# Patient Record
Sex: Male | Born: 1940 | Race: Black or African American | Hispanic: No | Smoking: Never smoker
Health system: Southern US, Community
[De-identification: ages and names within clinical notes are randomized; demographics above are authoritative.]

## PROBLEM LIST (undated history)

## (undated) DIAGNOSIS — Z86010 Personal history of colonic polyps: Secondary | ICD-10-CM

## (undated) DIAGNOSIS — K219 Gastro-esophageal reflux disease without esophagitis: Secondary | ICD-10-CM

## (undated) DIAGNOSIS — J45909 Unspecified asthma, uncomplicated: Secondary | ICD-10-CM

## (undated) DIAGNOSIS — M25511 Pain in right shoulder: Secondary | ICD-10-CM

## (undated) DIAGNOSIS — R030 Elevated blood-pressure reading, without diagnosis of hypertension: Secondary | ICD-10-CM

## (undated) DIAGNOSIS — R350 Frequency of micturition: Secondary | ICD-10-CM

## (undated) DIAGNOSIS — K635 Polyp of colon: Secondary | ICD-10-CM

## (undated) DIAGNOSIS — B029 Zoster without complications: Secondary | ICD-10-CM

## (undated) DIAGNOSIS — K222 Esophageal obstruction: Secondary | ICD-10-CM

## (undated) HISTORY — PX: HERNIA REPAIR: SHX51

## (undated) HISTORY — DX: Unspecified asthma, uncomplicated: J45.909

## (undated) HISTORY — PX: HEMORRHOID SURGERY: SHX153

## (undated) HISTORY — DX: Personal history of colonic polyps: Z86.010

## (undated) HISTORY — DX: Gastro-esophageal reflux disease without esophagitis: K21.9

## (undated) HISTORY — PX: TONSILLECTOMY: SUR1361

## (undated) HISTORY — DX: Pain in right shoulder: M25.511

## (undated) HISTORY — DX: Zoster without complications: B02.9

## (undated) HISTORY — DX: Frequency of micturition: R35.0

## (undated) HISTORY — DX: Elevated blood-pressure reading, without diagnosis of hypertension: R03.0

## (undated) HISTORY — DX: Polyp of colon: K63.5

## (undated) HISTORY — DX: Esophageal obstruction: K22.2

---

## 1989-12-23 DIAGNOSIS — Z860100 Personal history of colon polyps, unspecified: Secondary | ICD-10-CM

## 1989-12-23 HISTORY — DX: Personal history of colonic polyps: Z86.010

## 1989-12-23 HISTORY — DX: Personal history of colon polyps, unspecified: Z86.0100

## 2000-07-27 ENCOUNTER — Inpatient Hospital Stay (HOSPITAL_COMMUNITY): Admission: EM | Admit: 2000-07-27 | Discharge: 2000-08-04 | Payer: Self-pay | Admitting: Emergency Medicine

## 2000-07-27 ENCOUNTER — Encounter: Payer: Self-pay | Admitting: Emergency Medicine

## 2001-02-05 ENCOUNTER — Inpatient Hospital Stay (HOSPITAL_COMMUNITY): Admission: RE | Admit: 2001-02-05 | Discharge: 2001-02-07 | Payer: Self-pay | Admitting: Internal Medicine

## 2003-07-29 ENCOUNTER — Inpatient Hospital Stay (HOSPITAL_COMMUNITY): Admission: EM | Admit: 2003-07-29 | Discharge: 2003-08-02 | Payer: Self-pay | Admitting: Internal Medicine

## 2004-11-01 ENCOUNTER — Ambulatory Visit: Payer: Self-pay | Admitting: Internal Medicine

## 2004-12-07 ENCOUNTER — Ambulatory Visit: Payer: Self-pay | Admitting: Internal Medicine

## 2005-01-10 ENCOUNTER — Ambulatory Visit: Payer: Self-pay | Admitting: Internal Medicine

## 2005-03-01 ENCOUNTER — Ambulatory Visit: Payer: Self-pay | Admitting: Internal Medicine

## 2005-04-16 ENCOUNTER — Ambulatory Visit: Payer: Self-pay | Admitting: Internal Medicine

## 2005-06-04 ENCOUNTER — Ambulatory Visit: Payer: Self-pay | Admitting: Internal Medicine

## 2005-07-31 ENCOUNTER — Ambulatory Visit: Payer: Self-pay | Admitting: Internal Medicine

## 2005-08-14 ENCOUNTER — Ambulatory Visit: Payer: Self-pay | Admitting: Internal Medicine

## 2005-08-29 ENCOUNTER — Ambulatory Visit: Payer: Self-pay | Admitting: Internal Medicine

## 2005-09-16 ENCOUNTER — Ambulatory Visit: Payer: Self-pay | Admitting: Internal Medicine

## 2005-10-23 ENCOUNTER — Ambulatory Visit: Payer: Self-pay | Admitting: Internal Medicine

## 2005-11-06 ENCOUNTER — Ambulatory Visit: Payer: Self-pay | Admitting: Internal Medicine

## 2005-11-25 ENCOUNTER — Ambulatory Visit: Payer: Self-pay | Admitting: Internal Medicine

## 2005-11-27 ENCOUNTER — Ambulatory Visit: Payer: Self-pay | Admitting: Internal Medicine

## 2005-12-09 ENCOUNTER — Ambulatory Visit: Payer: Self-pay | Admitting: Internal Medicine

## 2005-12-12 ENCOUNTER — Ambulatory Visit: Payer: Self-pay | Admitting: Internal Medicine

## 2005-12-17 ENCOUNTER — Ambulatory Visit: Payer: Self-pay | Admitting: Internal Medicine

## 2005-12-24 ENCOUNTER — Ambulatory Visit: Payer: Self-pay | Admitting: Internal Medicine

## 2005-12-27 ENCOUNTER — Ambulatory Visit: Payer: Self-pay | Admitting: Internal Medicine

## 2006-01-03 ENCOUNTER — Ambulatory Visit: Payer: Self-pay | Admitting: Internal Medicine

## 2006-01-07 ENCOUNTER — Ambulatory Visit: Payer: Self-pay | Admitting: Internal Medicine

## 2006-01-08 ENCOUNTER — Ambulatory Visit: Payer: Self-pay | Admitting: Internal Medicine

## 2006-01-10 ENCOUNTER — Ambulatory Visit: Payer: Self-pay | Admitting: Internal Medicine

## 2006-01-14 ENCOUNTER — Ambulatory Visit: Payer: Self-pay | Admitting: Internal Medicine

## 2006-01-16 ENCOUNTER — Ambulatory Visit: Payer: Self-pay | Admitting: Internal Medicine

## 2006-01-17 ENCOUNTER — Ambulatory Visit: Payer: Self-pay | Admitting: Internal Medicine

## 2006-01-21 ENCOUNTER — Ambulatory Visit: Payer: Self-pay | Admitting: Internal Medicine

## 2006-01-23 ENCOUNTER — Ambulatory Visit: Payer: Self-pay | Admitting: Internal Medicine

## 2006-01-28 ENCOUNTER — Ambulatory Visit: Payer: Self-pay | Admitting: Internal Medicine

## 2006-01-31 ENCOUNTER — Ambulatory Visit: Payer: Self-pay | Admitting: Internal Medicine

## 2006-02-05 ENCOUNTER — Ambulatory Visit: Payer: Self-pay | Admitting: Internal Medicine

## 2006-02-07 ENCOUNTER — Ambulatory Visit: Payer: Self-pay | Admitting: Internal Medicine

## 2006-02-10 ENCOUNTER — Ambulatory Visit: Payer: Self-pay | Admitting: Internal Medicine

## 2006-02-12 ENCOUNTER — Ambulatory Visit: Payer: Self-pay | Admitting: Internal Medicine

## 2006-02-17 ENCOUNTER — Ambulatory Visit: Payer: Self-pay | Admitting: Internal Medicine

## 2006-02-19 ENCOUNTER — Ambulatory Visit: Payer: Self-pay | Admitting: Internal Medicine

## 2006-02-21 ENCOUNTER — Ambulatory Visit: Payer: Self-pay | Admitting: Internal Medicine

## 2006-02-25 ENCOUNTER — Ambulatory Visit: Payer: Self-pay | Admitting: Internal Medicine

## 2006-02-28 ENCOUNTER — Ambulatory Visit: Payer: Self-pay | Admitting: Internal Medicine

## 2006-03-03 ENCOUNTER — Ambulatory Visit: Payer: Self-pay | Admitting: Internal Medicine

## 2006-03-04 ENCOUNTER — Ambulatory Visit: Payer: Self-pay | Admitting: Internal Medicine

## 2006-03-07 ENCOUNTER — Ambulatory Visit: Payer: Self-pay | Admitting: Internal Medicine

## 2006-03-12 ENCOUNTER — Ambulatory Visit: Payer: Self-pay | Admitting: Internal Medicine

## 2006-03-13 ENCOUNTER — Ambulatory Visit: Payer: Self-pay | Admitting: Internal Medicine

## 2006-03-14 ENCOUNTER — Ambulatory Visit: Payer: Self-pay | Admitting: Internal Medicine

## 2006-03-19 ENCOUNTER — Ambulatory Visit: Payer: Self-pay | Admitting: Internal Medicine

## 2006-03-21 ENCOUNTER — Ambulatory Visit: Payer: Self-pay | Admitting: Internal Medicine

## 2006-03-31 ENCOUNTER — Ambulatory Visit: Payer: Self-pay | Admitting: Internal Medicine

## 2006-04-11 ENCOUNTER — Ambulatory Visit: Payer: Self-pay | Admitting: Internal Medicine

## 2006-04-18 ENCOUNTER — Ambulatory Visit: Payer: Self-pay | Admitting: Internal Medicine

## 2006-04-25 ENCOUNTER — Ambulatory Visit: Payer: Self-pay | Admitting: Internal Medicine

## 2006-05-09 ENCOUNTER — Ambulatory Visit: Payer: Self-pay | Admitting: Internal Medicine

## 2006-05-14 ENCOUNTER — Ambulatory Visit: Payer: Self-pay | Admitting: Internal Medicine

## 2006-05-23 ENCOUNTER — Ambulatory Visit: Payer: Self-pay | Admitting: Internal Medicine

## 2006-06-11 ENCOUNTER — Ambulatory Visit: Payer: Self-pay | Admitting: Internal Medicine

## 2006-06-17 ENCOUNTER — Ambulatory Visit: Payer: Self-pay | Admitting: Internal Medicine

## 2006-07-11 ENCOUNTER — Ambulatory Visit: Payer: Self-pay | Admitting: Internal Medicine

## 2006-07-16 ENCOUNTER — Ambulatory Visit: Payer: Self-pay | Admitting: Internal Medicine

## 2006-07-29 ENCOUNTER — Ambulatory Visit: Payer: Self-pay | Admitting: Internal Medicine

## 2006-08-08 ENCOUNTER — Ambulatory Visit: Payer: Self-pay | Admitting: Internal Medicine

## 2006-08-13 ENCOUNTER — Ambulatory Visit: Payer: Self-pay | Admitting: Internal Medicine

## 2006-08-15 ENCOUNTER — Ambulatory Visit: Payer: Self-pay | Admitting: Internal Medicine

## 2006-08-19 ENCOUNTER — Ambulatory Visit: Payer: Self-pay | Admitting: Internal Medicine

## 2006-08-29 ENCOUNTER — Ambulatory Visit: Payer: Self-pay | Admitting: Internal Medicine

## 2006-09-01 ENCOUNTER — Ambulatory Visit: Payer: Self-pay | Admitting: Internal Medicine

## 2006-09-05 ENCOUNTER — Ambulatory Visit: Payer: Self-pay | Admitting: Internal Medicine

## 2006-09-12 ENCOUNTER — Ambulatory Visit: Payer: Self-pay | Admitting: Internal Medicine

## 2006-09-19 ENCOUNTER — Ambulatory Visit: Payer: Self-pay | Admitting: Internal Medicine

## 2006-10-03 ENCOUNTER — Ambulatory Visit: Payer: Self-pay | Admitting: Internal Medicine

## 2006-10-17 ENCOUNTER — Ambulatory Visit: Payer: Self-pay | Admitting: Internal Medicine

## 2006-10-31 ENCOUNTER — Ambulatory Visit: Payer: Self-pay | Admitting: Internal Medicine

## 2006-11-03 ENCOUNTER — Ambulatory Visit: Payer: Self-pay | Admitting: Emergency Medicine

## 2006-11-06 ENCOUNTER — Ambulatory Visit: Payer: Self-pay | Admitting: Internal Medicine

## 2006-11-28 ENCOUNTER — Ambulatory Visit: Payer: Self-pay | Admitting: Internal Medicine

## 2006-12-03 ENCOUNTER — Ambulatory Visit: Payer: Self-pay | Admitting: Internal Medicine

## 2006-12-25 ENCOUNTER — Ambulatory Visit: Payer: Self-pay | Admitting: Internal Medicine

## 2006-12-29 ENCOUNTER — Ambulatory Visit: Payer: Self-pay | Admitting: Internal Medicine

## 2007-01-15 ENCOUNTER — Ambulatory Visit: Payer: Self-pay | Admitting: Internal Medicine

## 2007-01-27 ENCOUNTER — Ambulatory Visit: Payer: Self-pay | Admitting: Internal Medicine

## 2007-01-28 ENCOUNTER — Emergency Department (HOSPITAL_COMMUNITY): Admission: EM | Admit: 2007-01-28 | Discharge: 2007-01-28 | Payer: Self-pay | Admitting: Emergency Medicine

## 2007-01-29 ENCOUNTER — Ambulatory Visit: Payer: Self-pay | Admitting: Internal Medicine

## 2007-02-13 ENCOUNTER — Ambulatory Visit: Payer: Self-pay | Admitting: Internal Medicine

## 2007-02-20 ENCOUNTER — Ambulatory Visit: Payer: Self-pay | Admitting: Internal Medicine

## 2007-02-20 ENCOUNTER — Ambulatory Visit: Payer: Self-pay | Admitting: Pulmonary Disease

## 2007-02-27 ENCOUNTER — Ambulatory Visit: Payer: Self-pay | Admitting: Internal Medicine

## 2007-03-05 ENCOUNTER — Ambulatory Visit: Payer: Self-pay | Admitting: Internal Medicine

## 2007-03-06 ENCOUNTER — Ambulatory Visit: Payer: Self-pay | Admitting: Internal Medicine

## 2007-03-09 ENCOUNTER — Ambulatory Visit: Payer: Self-pay | Admitting: Internal Medicine

## 2007-03-12 ENCOUNTER — Ambulatory Visit: Payer: Self-pay | Admitting: Internal Medicine

## 2007-03-20 ENCOUNTER — Ambulatory Visit: Payer: Self-pay | Admitting: Internal Medicine

## 2007-03-24 ENCOUNTER — Ambulatory Visit: Payer: Self-pay | Admitting: Internal Medicine

## 2007-04-03 ENCOUNTER — Ambulatory Visit: Payer: Self-pay | Admitting: Internal Medicine

## 2007-04-10 ENCOUNTER — Ambulatory Visit: Payer: Self-pay | Admitting: Internal Medicine

## 2007-04-23 ENCOUNTER — Ambulatory Visit: Payer: Self-pay | Admitting: Internal Medicine

## 2007-05-06 ENCOUNTER — Ambulatory Visit: Payer: Self-pay | Admitting: Internal Medicine

## 2007-06-08 ENCOUNTER — Ambulatory Visit: Payer: Self-pay | Admitting: Internal Medicine

## 2007-07-07 ENCOUNTER — Ambulatory Visit: Payer: Self-pay | Admitting: Internal Medicine

## 2007-07-16 ENCOUNTER — Ambulatory Visit: Payer: Self-pay | Admitting: Internal Medicine

## 2007-07-24 ENCOUNTER — Ambulatory Visit: Payer: Self-pay | Admitting: Internal Medicine

## 2007-07-31 ENCOUNTER — Ambulatory Visit: Payer: Self-pay | Admitting: Internal Medicine

## 2007-08-07 ENCOUNTER — Ambulatory Visit: Payer: Self-pay | Admitting: Internal Medicine

## 2007-08-18 ENCOUNTER — Ambulatory Visit: Payer: Self-pay | Admitting: Internal Medicine

## 2007-08-28 ENCOUNTER — Ambulatory Visit: Payer: Self-pay | Admitting: Internal Medicine

## 2007-09-09 ENCOUNTER — Ambulatory Visit: Payer: Self-pay | Admitting: Internal Medicine

## 2007-09-18 ENCOUNTER — Ambulatory Visit: Payer: Self-pay | Admitting: Internal Medicine

## 2007-10-02 ENCOUNTER — Ambulatory Visit: Payer: Self-pay | Admitting: Internal Medicine

## 2007-10-07 ENCOUNTER — Ambulatory Visit: Payer: Self-pay | Admitting: Internal Medicine

## 2007-10-23 ENCOUNTER — Ambulatory Visit: Payer: Self-pay | Admitting: Internal Medicine

## 2007-11-05 ENCOUNTER — Ambulatory Visit: Payer: Self-pay | Admitting: Internal Medicine

## 2007-12-08 ENCOUNTER — Ambulatory Visit: Payer: Self-pay | Admitting: Internal Medicine

## 2007-12-14 ENCOUNTER — Encounter: Payer: Self-pay | Admitting: Internal Medicine

## 2007-12-18 ENCOUNTER — Ambulatory Visit: Payer: Self-pay | Admitting: Internal Medicine

## 2008-01-04 DIAGNOSIS — J4550 Severe persistent asthma, uncomplicated: Secondary | ICD-10-CM | POA: Insufficient documentation

## 2008-01-04 DIAGNOSIS — R350 Frequency of micturition: Secondary | ICD-10-CM

## 2008-01-04 DIAGNOSIS — I10 Essential (primary) hypertension: Secondary | ICD-10-CM | POA: Insufficient documentation

## 2008-01-05 ENCOUNTER — Ambulatory Visit: Payer: Self-pay | Admitting: Internal Medicine

## 2008-01-05 DIAGNOSIS — M25519 Pain in unspecified shoulder: Secondary | ICD-10-CM | POA: Insufficient documentation

## 2008-01-06 ENCOUNTER — Ambulatory Visit: Payer: Self-pay | Admitting: Pulmonary Disease

## 2008-01-07 ENCOUNTER — Telehealth (INDEPENDENT_AMBULATORY_CARE_PROVIDER_SITE_OTHER): Payer: Self-pay | Admitting: *Deleted

## 2008-01-08 ENCOUNTER — Emergency Department (HOSPITAL_COMMUNITY): Admission: EM | Admit: 2008-01-08 | Discharge: 2008-01-08 | Payer: Self-pay | Admitting: Emergency Medicine

## 2008-01-08 ENCOUNTER — Encounter: Payer: Self-pay | Admitting: Adult Health

## 2008-01-22 ENCOUNTER — Ambulatory Visit: Payer: Self-pay | Admitting: Internal Medicine

## 2008-01-25 ENCOUNTER — Ambulatory Visit: Payer: Self-pay | Admitting: Internal Medicine

## 2008-02-03 ENCOUNTER — Ambulatory Visit: Payer: Self-pay | Admitting: Internal Medicine

## 2008-02-17 ENCOUNTER — Ambulatory Visit: Payer: Self-pay | Admitting: Internal Medicine

## 2008-03-08 ENCOUNTER — Ambulatory Visit: Payer: Self-pay | Admitting: Internal Medicine

## 2008-03-14 ENCOUNTER — Encounter: Payer: Self-pay | Admitting: Internal Medicine

## 2008-03-23 ENCOUNTER — Encounter (INDEPENDENT_AMBULATORY_CARE_PROVIDER_SITE_OTHER): Payer: Self-pay | Admitting: *Deleted

## 2008-03-23 ENCOUNTER — Ambulatory Visit: Payer: Self-pay | Admitting: Internal Medicine

## 2008-04-08 ENCOUNTER — Ambulatory Visit: Payer: Self-pay | Admitting: Internal Medicine

## 2008-04-22 ENCOUNTER — Ambulatory Visit: Payer: Self-pay | Admitting: Internal Medicine

## 2008-04-29 ENCOUNTER — Ambulatory Visit: Payer: Self-pay | Admitting: Internal Medicine

## 2008-05-06 ENCOUNTER — Ambulatory Visit: Payer: Self-pay | Admitting: Internal Medicine

## 2008-05-13 ENCOUNTER — Ambulatory Visit: Payer: Self-pay | Admitting: Internal Medicine

## 2008-05-27 ENCOUNTER — Ambulatory Visit: Payer: Self-pay | Admitting: Internal Medicine

## 2008-06-09 ENCOUNTER — Ambulatory Visit: Payer: Self-pay | Admitting: Internal Medicine

## 2008-06-16 ENCOUNTER — Ambulatory Visit: Payer: Self-pay | Admitting: Internal Medicine

## 2008-06-27 ENCOUNTER — Ambulatory Visit: Payer: Self-pay | Admitting: Internal Medicine

## 2008-07-11 ENCOUNTER — Ambulatory Visit: Payer: Self-pay | Admitting: Internal Medicine

## 2008-08-05 ENCOUNTER — Ambulatory Visit: Payer: Self-pay | Admitting: Internal Medicine

## 2008-08-11 ENCOUNTER — Ambulatory Visit: Payer: Self-pay | Admitting: Internal Medicine

## 2008-08-26 ENCOUNTER — Ambulatory Visit: Payer: Self-pay | Admitting: Internal Medicine

## 2008-09-13 ENCOUNTER — Ambulatory Visit: Payer: Self-pay | Admitting: Internal Medicine

## 2008-09-15 ENCOUNTER — Encounter: Payer: Self-pay | Admitting: Internal Medicine

## 2008-10-05 ENCOUNTER — Ambulatory Visit: Payer: Self-pay | Admitting: Internal Medicine

## 2008-10-17 ENCOUNTER — Ambulatory Visit: Payer: Self-pay | Admitting: Internal Medicine

## 2008-11-11 ENCOUNTER — Ambulatory Visit: Payer: Self-pay | Admitting: Internal Medicine

## 2008-11-16 ENCOUNTER — Telehealth (INDEPENDENT_AMBULATORY_CARE_PROVIDER_SITE_OTHER): Payer: Self-pay | Admitting: *Deleted

## 2008-11-28 ENCOUNTER — Ambulatory Visit: Payer: Self-pay | Admitting: Internal Medicine

## 2008-12-21 ENCOUNTER — Ambulatory Visit: Payer: Self-pay | Admitting: Internal Medicine

## 2009-01-11 ENCOUNTER — Ambulatory Visit: Payer: Self-pay | Admitting: Internal Medicine

## 2009-01-16 ENCOUNTER — Telehealth: Payer: Self-pay | Admitting: Internal Medicine

## 2009-01-16 ENCOUNTER — Ambulatory Visit: Payer: Self-pay | Admitting: Critical Care Medicine

## 2009-02-08 ENCOUNTER — Ambulatory Visit: Payer: Self-pay | Admitting: Internal Medicine

## 2009-03-07 ENCOUNTER — Ambulatory Visit: Payer: Self-pay | Admitting: Internal Medicine

## 2009-04-03 ENCOUNTER — Ambulatory Visit: Payer: Self-pay | Admitting: Internal Medicine

## 2009-04-04 ENCOUNTER — Telehealth (INDEPENDENT_AMBULATORY_CARE_PROVIDER_SITE_OTHER): Payer: Self-pay | Admitting: *Deleted

## 2009-04-05 ENCOUNTER — Ambulatory Visit: Payer: Self-pay | Admitting: Internal Medicine

## 2009-04-19 ENCOUNTER — Ambulatory Visit: Payer: Self-pay | Admitting: Internal Medicine

## 2009-05-24 ENCOUNTER — Ambulatory Visit: Payer: Self-pay | Admitting: Internal Medicine

## 2009-05-31 ENCOUNTER — Ambulatory Visit: Payer: Self-pay | Admitting: Internal Medicine

## 2009-05-31 DIAGNOSIS — J302 Other seasonal allergic rhinitis: Secondary | ICD-10-CM

## 2009-05-31 DIAGNOSIS — J3089 Other allergic rhinitis: Secondary | ICD-10-CM

## 2009-06-05 ENCOUNTER — Emergency Department (HOSPITAL_COMMUNITY): Admission: EM | Admit: 2009-06-05 | Discharge: 2009-06-06 | Payer: Self-pay | Admitting: Emergency Medicine

## 2009-06-06 ENCOUNTER — Telehealth (INDEPENDENT_AMBULATORY_CARE_PROVIDER_SITE_OTHER): Payer: Self-pay | Admitting: *Deleted

## 2009-06-15 ENCOUNTER — Ambulatory Visit: Payer: Self-pay | Admitting: Internal Medicine

## 2009-06-30 ENCOUNTER — Ambulatory Visit: Payer: Self-pay | Admitting: Internal Medicine

## 2009-08-02 ENCOUNTER — Ambulatory Visit: Payer: Self-pay | Admitting: Internal Medicine

## 2009-08-04 ENCOUNTER — Telehealth: Payer: Self-pay | Admitting: Internal Medicine

## 2009-08-31 ENCOUNTER — Ambulatory Visit: Payer: Self-pay | Admitting: Internal Medicine

## 2009-09-21 ENCOUNTER — Ambulatory Visit: Payer: Self-pay | Admitting: Internal Medicine

## 2009-09-21 ENCOUNTER — Telehealth: Payer: Self-pay | Admitting: Internal Medicine

## 2009-10-02 ENCOUNTER — Ambulatory Visit: Payer: Self-pay | Admitting: Internal Medicine

## 2009-10-19 ENCOUNTER — Ambulatory Visit: Payer: Self-pay | Admitting: Internal Medicine

## 2009-10-25 ENCOUNTER — Ambulatory Visit: Payer: Self-pay | Admitting: Internal Medicine

## 2009-10-30 ENCOUNTER — Ambulatory Visit: Payer: Self-pay | Admitting: Internal Medicine

## 2009-11-06 ENCOUNTER — Ambulatory Visit: Payer: Self-pay | Admitting: Internal Medicine

## 2009-11-06 DIAGNOSIS — K219 Gastro-esophageal reflux disease without esophagitis: Secondary | ICD-10-CM | POA: Insufficient documentation

## 2009-11-15 ENCOUNTER — Ambulatory Visit: Payer: Self-pay | Admitting: Internal Medicine

## 2009-11-20 ENCOUNTER — Ambulatory Visit: Payer: Self-pay | Admitting: Internal Medicine

## 2009-11-29 ENCOUNTER — Ambulatory Visit: Payer: Self-pay | Admitting: Internal Medicine

## 2009-12-05 ENCOUNTER — Ambulatory Visit: Payer: Self-pay | Admitting: Internal Medicine

## 2009-12-08 ENCOUNTER — Ambulatory Visit: Payer: Self-pay | Admitting: Internal Medicine

## 2009-12-20 ENCOUNTER — Ambulatory Visit: Payer: Self-pay | Admitting: Internal Medicine

## 2009-12-23 DIAGNOSIS — K635 Polyp of colon: Secondary | ICD-10-CM

## 2009-12-23 HISTORY — DX: Polyp of colon: K63.5

## 2009-12-27 ENCOUNTER — Ambulatory Visit: Payer: Self-pay | Admitting: Internal Medicine

## 2010-01-04 ENCOUNTER — Ambulatory Visit: Payer: Self-pay | Admitting: Internal Medicine

## 2010-01-08 ENCOUNTER — Ambulatory Visit: Payer: Self-pay | Admitting: Internal Medicine

## 2010-01-11 ENCOUNTER — Ambulatory Visit: Payer: Self-pay | Admitting: Internal Medicine

## 2010-01-17 ENCOUNTER — Ambulatory Visit: Payer: Self-pay | Admitting: Internal Medicine

## 2010-01-26 ENCOUNTER — Ambulatory Visit: Payer: Self-pay | Admitting: Internal Medicine

## 2010-01-26 DIAGNOSIS — R079 Chest pain, unspecified: Secondary | ICD-10-CM

## 2010-01-30 ENCOUNTER — Ambulatory Visit: Payer: Self-pay | Admitting: Internal Medicine

## 2010-02-06 ENCOUNTER — Ambulatory Visit: Payer: Self-pay | Admitting: Internal Medicine

## 2010-02-06 LAB — CONVERTED CEMR LAB
Bilirubin Urine: NEGATIVE
Ketones, ur: NEGATIVE mg/dL
Leukocytes, UA: NEGATIVE
Specific Gravity, Urine: <=1.005 (ref 1.000–1.030)
Urobilinogen, UA: 0.2 (ref 0.0–1.0)

## 2010-02-20 ENCOUNTER — Ambulatory Visit: Payer: Self-pay | Admitting: Internal Medicine

## 2010-02-28 ENCOUNTER — Ambulatory Visit: Payer: Self-pay | Admitting: Internal Medicine

## 2010-03-05 ENCOUNTER — Encounter: Payer: Self-pay | Admitting: Adult Health

## 2010-03-07 ENCOUNTER — Ambulatory Visit: Payer: Self-pay | Admitting: Internal Medicine

## 2010-03-20 ENCOUNTER — Ambulatory Visit: Payer: Self-pay | Admitting: Internal Medicine

## 2010-03-30 ENCOUNTER — Ambulatory Visit: Payer: Self-pay | Admitting: Internal Medicine

## 2010-04-03 ENCOUNTER — Ambulatory Visit: Payer: Self-pay | Admitting: Internal Medicine

## 2010-04-10 ENCOUNTER — Ambulatory Visit: Payer: Self-pay | Admitting: Internal Medicine

## 2010-04-18 ENCOUNTER — Ambulatory Visit: Payer: Self-pay | Admitting: Internal Medicine

## 2010-05-01 ENCOUNTER — Telehealth (INDEPENDENT_AMBULATORY_CARE_PROVIDER_SITE_OTHER): Payer: Self-pay | Admitting: *Deleted

## 2010-05-03 ENCOUNTER — Ambulatory Visit: Payer: Self-pay | Admitting: Internal Medicine

## 2010-05-14 ENCOUNTER — Telehealth: Payer: Self-pay | Admitting: Internal Medicine

## 2010-05-16 ENCOUNTER — Ambulatory Visit: Payer: Self-pay | Admitting: Internal Medicine

## 2010-05-16 ENCOUNTER — Telehealth (INDEPENDENT_AMBULATORY_CARE_PROVIDER_SITE_OTHER): Payer: Self-pay | Admitting: *Deleted

## 2010-05-25 ENCOUNTER — Ambulatory Visit: Payer: Self-pay | Admitting: Internal Medicine

## 2010-05-29 ENCOUNTER — Ambulatory Visit: Payer: Self-pay | Admitting: Internal Medicine

## 2010-06-07 ENCOUNTER — Ambulatory Visit: Payer: Self-pay | Admitting: Internal Medicine

## 2010-06-13 ENCOUNTER — Ambulatory Visit: Payer: Self-pay | Admitting: Internal Medicine

## 2010-06-18 ENCOUNTER — Ambulatory Visit: Payer: Self-pay | Admitting: Internal Medicine

## 2010-07-06 ENCOUNTER — Ambulatory Visit: Payer: Self-pay | Admitting: Internal Medicine

## 2010-07-12 ENCOUNTER — Telehealth: Payer: Self-pay | Admitting: Internal Medicine

## 2010-07-12 ENCOUNTER — Ambulatory Visit: Payer: Self-pay | Admitting: Internal Medicine

## 2010-07-24 ENCOUNTER — Telehealth: Payer: Self-pay | Admitting: Internal Medicine

## 2010-07-27 ENCOUNTER — Ambulatory Visit: Payer: Self-pay | Admitting: Internal Medicine

## 2010-07-31 ENCOUNTER — Ambulatory Visit: Payer: Self-pay | Admitting: Internal Medicine

## 2010-08-16 ENCOUNTER — Ambulatory Visit: Payer: Self-pay | Admitting: Internal Medicine

## 2010-08-30 ENCOUNTER — Ambulatory Visit: Payer: Self-pay | Admitting: Internal Medicine

## 2010-09-03 ENCOUNTER — Ambulatory Visit: Payer: Self-pay | Admitting: Internal Medicine

## 2010-09-03 ENCOUNTER — Encounter: Payer: Self-pay | Admitting: Internal Medicine

## 2010-09-04 ENCOUNTER — Ambulatory Visit: Payer: Self-pay | Admitting: Internal Medicine

## 2010-09-05 LAB — CONVERTED CEMR LAB: CK-MB: 3.3 ng/mL (ref 0.3–4.0)

## 2010-09-10 ENCOUNTER — Ambulatory Visit: Payer: Self-pay | Admitting: Internal Medicine

## 2010-09-12 ENCOUNTER — Telehealth (INDEPENDENT_AMBULATORY_CARE_PROVIDER_SITE_OTHER): Payer: Self-pay | Admitting: *Deleted

## 2010-09-13 ENCOUNTER — Ambulatory Visit: Payer: Self-pay | Admitting: Internal Medicine

## 2010-09-21 ENCOUNTER — Ambulatory Visit: Payer: Self-pay | Admitting: Internal Medicine

## 2010-09-28 ENCOUNTER — Ambulatory Visit: Payer: Self-pay | Admitting: Internal Medicine

## 2010-10-05 ENCOUNTER — Ambulatory Visit: Payer: Self-pay | Admitting: Internal Medicine

## 2010-10-17 ENCOUNTER — Ambulatory Visit: Payer: Self-pay | Admitting: Internal Medicine

## 2010-10-22 ENCOUNTER — Ambulatory Visit: Payer: Self-pay | Admitting: Internal Medicine

## 2010-11-08 ENCOUNTER — Ambulatory Visit: Payer: Self-pay | Admitting: Internal Medicine

## 2010-11-12 ENCOUNTER — Encounter: Payer: Self-pay | Admitting: Internal Medicine

## 2010-11-12 ENCOUNTER — Ambulatory Visit: Payer: Self-pay | Admitting: Internal Medicine

## 2010-11-19 ENCOUNTER — Ambulatory Visit: Payer: Self-pay | Admitting: Internal Medicine

## 2010-11-20 ENCOUNTER — Ambulatory Visit: Payer: Self-pay | Admitting: Internal Medicine

## 2010-11-23 ENCOUNTER — Telehealth: Payer: Self-pay | Admitting: Internal Medicine

## 2010-11-29 ENCOUNTER — Ambulatory Visit: Payer: Self-pay | Admitting: Internal Medicine

## 2010-12-11 ENCOUNTER — Ambulatory Visit: Payer: Self-pay | Admitting: Internal Medicine

## 2010-12-25 ENCOUNTER — Ambulatory Visit: Payer: Self-pay | Admitting: Internal Medicine

## 2010-12-26 ENCOUNTER — Encounter (INDEPENDENT_AMBULATORY_CARE_PROVIDER_SITE_OTHER): Payer: Self-pay | Admitting: *Deleted

## 2011-01-09 ENCOUNTER — Ambulatory Visit
Admission: RE | Admit: 2011-01-09 | Discharge: 2011-01-09 | Payer: Self-pay | Source: Home / Self Care | Attending: Internal Medicine | Admitting: Internal Medicine

## 2011-01-10 ENCOUNTER — Ambulatory Visit: Payer: Self-pay | Admitting: Internal Medicine

## 2011-01-20 LAB — CONVERTED CEMR LAB
AST: 22 units/L (ref 0–37)
Albumin: 3.8 g/dL (ref 3.5–5.2)
BUN: 13 mg/dL (ref 6–23)
Basophils Absolute: 0 10*3/uL (ref 0.0–0.1)
Chloride: 111 meq/L (ref 96–112)
Cholesterol: 198 mg/dL (ref 0–200)
Eosinophils Relative: 0.8 % (ref 0.0–5.0)
Glucose, Bld: 102 mg/dL — ABNORMAL HIGH (ref 70–99)
HCT: 41.4 % (ref 39.0–52.0)
Lymphs Abs: 2.3 10*3/uL (ref 0.7–4.0)
MCV: 88.1 fL (ref 78.0–100.0)
Monocytes Absolute: 0.3 10*3/uL (ref 0.1–1.0)
Neutro Abs: 9 10*3/uL — ABNORMAL HIGH (ref 1.4–7.7)
Platelets: 305 10*3/uL (ref 150.0–400.0)
Potassium: 3.8 meq/L (ref 3.5–5.1)
RDW: 13 % (ref 11.5–14.6)
TSH: 0.74 microintl units/mL (ref 0.35–5.50)
Total Protein: 6.7 g/dL (ref 6.0–8.3)

## 2011-01-22 ENCOUNTER — Ambulatory Visit: Payer: Self-pay | Admitting: Internal Medicine

## 2011-01-24 NOTE — Progress Notes (Signed)
Summary: samples  Phone Note Call from Patient Call back at [facility] Phone [phone]   Caller: Patient Call For: wert Reason for Call: Talk to Nurse Summary of Call: need samples of Dulera Initial call taken by: Eugene Gavia,  May 01, 2010 3:37 PM  Follow-up for Phone Call        `Spoke with pt , and left him a sample of dulera 200 to pick up has an appt with Dr. Sherene Sires on Magdalene Molly 05/03/10

## 2011-01-24 NOTE — Assessment & Plan Note (Signed)
Summary: rov/missed appt/apc   Copy to:  Dr. Romeo Apple Primary Provider/Referring Provider:  none   History of Present Illness: October 19, 2009--Returns for follow up and no complaints. Symbicort does not add up, says he had 2 samples, only 9 doses used out of current symbicort inhaler. pt ed given , med calnedar adjusted. No flare since last visit. Denies chest pain, dyspnea, orthopnea, hemoptysis, fever, n/v/d, edema, headache.   October 25, 2009- Allergic asthma Off antihistamines, returning at his request for allergy re-testing. Feels well. He always wheezes some and admits cooler weather makes this a little worse. Denies fever, purulent, chest pain or palpitation.  Skin Test- Strongly Pos grass, dust mite and endodermals.   January 11, 2010- Allergic asthma Doing ok building allergy vaccine here. No reactions or problems. Asthma is "as good as it gets" now. Somedays he is clear with no wheeze, then weather  changes and he may wheeze a little bit. It doesn't keep him awake or keep him from doing what he wants to do. Sometimes productive cough.   May 16, 2010- Allergic asthma, Rhinitis Returning for allergy f/u. Sees Dr Sherene Sires regularly for primary pulmonary. He has built allergy vaccine here to maintenance at 1:50 with no reactions. He says rhinitis and asthma have done well through the Spring pollen season. Needs refill Nasonex. Last used Xopenex rescue inhaler 2-3 days ago. Denies nasal congestion, sneeze, blowing or headache.    Current Medications (verified): 1)  Nasonex 50 Mcg/act  Susp (Mometasone Furoate) .Marland Kitchen.. 1 Puff in Each Notril Two Times A Day 2)  Dulera 200-5 Mcg/act Aero (Mometasone Furo-Formoterol Fum) .... 2 Puffs First Thing  in Am and 2 Puffs Again in Pm About 12 Hours Later 3)  Adult Aspirin Ec Low Strength 81 Mg  Tbec (Aspirin) .... Take 1 Tablet By Mouth Once A Day 4)  Zegerid 40-1100 Mg Caps (Omeprazole-Sodium Bicarbonate) .... Take  One 30-60 Min Before First  Meal of The Day and At Bedtime 5)  Calcium Carbonate-Vitamin D 600-400 Mg-Unit  Tabs (Calcium Carbonate-Vitamin D) .... Take 1 Tablet By Mouth Two Times A Day 6)  Allergy Vaccine New Start Here 7)  Xopenex Hfa 45 Mcg/act Aero (Levalbuterol Tartrate) .Marland Kitchen.. 1-2 Puffs Every 4-6 Hours As Needed 8)  Albuterol Sulfate (2.5 Mg/34ml) 0.083%  Nebu (Albuterol Sulfate) .Marland Kitchen.. 1 Treatmen Every 4-6 Hours As Needed 9)  Xopenex 1.25 Mg/29ml Nebu (Levalbuterol Hcl) .... One Treatment Every 4-6 Hours As Needed 10)  Flutter Valve .... Use Every 4 Hours As Needed 11)  Mucus Relief 400 Mg  Tabs (Guaifenesin) .... 2-3 Every 12 Hours As Needed 12)  Aleve 220 Mg Tabs (Naproxen Sodium) .... As Needed Arthritis Pain Per Bottle  Allergies (verified): No Known Drug Allergies  Past History:  Past Medical History: Last updated: 05/03/2010 ASTHMA (ICD-493.90)   --chronic severe asthma  with best FEV1 2.2 L documented 2/02 and       documented non-adherence  Mainly lives off samples of inhalers and nose sprays    - HFA  75% June 15, 2009 > 90% August 02, 2009 > 100% December 27, 2009     - Off allergy vaccines x7/2010   > highly allergic skin tests per Dr Maple Hudson > restart vaccine November 06, 2009  SHOULDER PAIN, RIGHT (ICD-719.41) FREQUENCY, URINARY (ICD-788.41) HYPERTENSION, BORDERLINE (ICD-401.9) Shingles T 8 Left 9/01 HEALTH MAINTENANCE.............................................................................Marland KitchenWert    - Colonoscopy 12/1989    - dT  4/06    - Pneumovax 3/04 and 05/2009     -  Flu declined  October 17, 2008 and September 21, 2009      -CPX  May 31, 2009     - Meds reviewed with pt education and computerized med calendar adjusted  October 19, 2009, February 20, 2010      Past Surgical History: Last updated: 01/11/2010 Tonsillectomy Umbilical hernia hemorrhoids  Family History: Last updated: 05/31/2009 positive for asthma in sister positive for heart disease in mother brown lung in father lung  cancer brother smoker  Social History: Last updated: 05/31/2009 Denies ever smoking No ETOH Retired  Risk Factors: Smoking Status: never (01/04/2008)  Review of Systems      See HPI       The patient complains of non-productive cough.  The patient denies shortness of breath with activity, shortness of breath at rest, productive cough, coughing up blood, chest pain, irregular heartbeats, acid heartburn, indigestion, loss of appetite, weight change, abdominal pain, difficulty swallowing, sore throat, tooth/dental problems, headaches, nasal congestion/difficulty breathing through nose, and sneezing.    Vital Signs:  Patient profile:   70 year old male Height:      68 inches Weight:      202 pounds BMI:     30.83 O2 Sat:      93 % on Room air Pulse rate:   79 / minute BP sitting:   122 / 80  (left arm) Cuff size:   regular  Vitals Entered By: Reynaldo Minium CMA (May 16, 2010 10:42 AM)  O2 Flow:  Room air  Physical Exam  Additional Exam:  General: A/Ox3; pleasant and cooperative, calm/ reserved  205 November 06, 2009  >  206 February 06, 2010 >>202 February 20, 2010 > 205 March 20, 2010 > 209 May 03, 2010  SKIN: no rash, lesions NODES: no lymphadenopathy HEENT: [place]/AT, EOM- WNL, Conjuctivae- clear, PERRLA, TM-WNL, Nose- narrow on right/ septal deviation, Throat- clear and wnl, no erythema or drainage  Mallampati  III NECK: Supple w/ fair ROM, JVD- none, normal carotid impulses w/o bruits Thyroid-, not hoarse CHEST: Dry cough, wheeze right midback, unlabored, no rales. Slow expiratory phase. HEART: RRR, no m/g/r heard no edema ABDOMEN: Soft and nl;  EAV:WUJW, nl pulses, no clubbing, no calf tenderness   Impression & Recommendations:  Problem # 1:  CHRONIC RHINITIS (ICD-472.0)  Not active today. There is a septal deviation, not bothering him. Nasonex discussed and refilled.  Orders: Est. Patient Level IV (11914) Prescription Created Electronically [phone])  Problem # 2:   ASTHMA (ICD-493.90) Atopic component is being addressed. We will advance vaccine to 1:10 per protocol at next order.  Medications Added to Medication List This Visit: 1)  Allergy Vaccine 1:50 Gh  .... Will advance to 1:10 next order  Patient Instructions: 1)  Please schedule a follow-up appointment in 6 months. 2)  We will advance the strngth of your allergy shots to 1:10 at next ordering. Script given to lab. 3)  Script for Nasonex sent Prescriptions: NASONEX 50 MCG/ACT  SUSP (MOMETASONE FUROATE) 1 puff in each notril two times a day  #1 x prn   Entered and Authorized by:   Waymon Budge MD   Signed by:   Waymon Budge MD on 05/16/2010   Method used:   Electronically to        [facility]. [phone]* (retail)       [address]       [place],  [place]  [address]       Ph: [phone]       Fax: [phone]   RxID:   [id number]

## 2011-01-24 NOTE — Miscellaneous (Signed)
Summary: meds update  Clinical Lists Changes  Medications: Changed medication from NASONEX 50 MCG/ACT  SUSP (MOMETASONE FUROATE) Two puffs each nostril daily to NASONEX 50 MCG/ACT  SUSP (MOMETASONE FUROATE) 1 puff in each notril two times a day Added new medication of VESICARE 10 MG  TABS (SOLIFENACIN SUCCINATE) Take 1 tablet by mouth once a day Changed medication from OMEPRAZOLE 40 MG  CPDR (OMEPRAZOLE) Take 1 tablet by mouth once a day to OMEPRAZOLE 20 MG  CPDR (OMEPRAZOLE) Take 1 capsule by mouth once a day before meal Changed medication from MUCINEX 600 MG  TB12 (GUAIFENESIN) 1-2 two times a day as needed cough/congestion/thick mucus to MUCUS RELIEF 400 MG  TABS (GUAIFENESIN) 2-3 every 12 hours as needed

## 2011-01-24 NOTE — Assessment & Plan Note (Signed)
Summary: Primary/pulmonary ov, better on dulera   Copy to:  Dr. Romeo Apple Primary Provider/Referring Provider:  none  CC:  6 wk followup.  Pt states breathing has been okay.  Gets more SOB with "damp weather".  No new complaints today.Marland Kitchen  History of Present Illness: A 67 yobm never smoker with chronic severe asthma  with best FEV1 2.2 L  2/02 and documented non-adherence  Mainly lives off samples of inhalers and nose sprays- "cant afford"  September 21, 2009 ov worse sob/chest tightness off meds again, tells me now also off shots for months now. --refer to allergy    October 25, 2009- Allergic asthma Off antihistamines, returning at his request for allergy re-testing. Feels well. He always wheezes some and admits cooler weather makes this a little worse. Denies fever, purulent, chest pain or palpitation.  Skin Test- Strongly Pos grass, dust mite and endodermals.   January 11, 2010- Allergic asthma Doing ok building allergy vaccine here. No reactions or problems. Asthma is "as good as it gets" now. Somedays he is clear with no wheeze, then weather  changes and he may wheeze a little bit. It doesn't keep him awake or keep him from doing what he wants to do. Sometimes productive cough.   January 26, 2010 Acute visit.  Pt c/o wheezing x 2 days.  He also c/o soreness across chest and back worse with cough, has had similar problems  in past and also has assoc mild dysphagia.   Also has prod cough with cleatr sputum.   February 06, 2010 Acute visit.  Pt c/o prod cough with yellow sputum x 1 day.  He also c/o wheezing since 2/12 with increased need for neb and new left flank pain and polyuria but no dysuria.    February 20, 2010 --Returns for follow up and med review. Last visit w/ exacerbation , changed from symbicort to dulera. Tx w/ abx/steroid taper. He is feeling better and back to baseline.   March 20, 2010 f/u 1 wk.-not better,sob -same,cough-white, yellow ,wheezing = baseline for him, not  waking up or using rescue at all.  Dulera count was 76  May 03, 2010 6 wk followup.  Pt states breathing has been okay.  Gets more SOB with "damp weather".  No new complaints today. brand new dulera yesterday count 118. Pt denies any significant sore throat, dysphagia, itching, sneezing,  nasal congestion, cp.  Current Medications (verified): 1)  Nasonex 50 Mcg/act  Susp (Mometasone Furoate) .Marland Kitchen.. 1 Puff in Each Notril Two Times A Day 2)  Dulera 200-5 Mcg/act Aero (Mometasone Furo-Formoterol Fum) .... 2 Puffs First Thing  in Am and 2 Puffs Again in Pm About 12 Hours Later 3)  Adult Aspirin Ec Low Strength 81 Mg  Tbec (Aspirin) .... Take 1 Tablet By Mouth Once A Day 4)  Zegerid 40-1100 Mg Caps (Omeprazole-Sodium Bicarbonate) .... Take  One 30-60 Min Before First Meal of The Day and At Bedtime 5)  Calcium Carbonate-Vitamin D 600-400 Mg-Unit  Tabs (Calcium Carbonate-Vitamin D) .... Take 1 Tablet By Mouth Two Times A Day 6)  Allergy Vaccine New Start Here 7)  Xopenex Hfa 45 Mcg/act Aero (Levalbuterol Tartrate) .Marland Kitchen.. 1-2 Puffs Every 4-6 Hours As Needed 8)  Albuterol Sulfate (2.5 Mg/59ml) 0.083%  Nebu (Albuterol Sulfate) .Marland Kitchen.. 1 Treatmen Every 4-6 Hours As Needed 9)  Xopenex 1.25 Mg/55ml Nebu (Levalbuterol Hcl) .... One Treatment Every 4-6 Hours As Needed 10)  Flutter Valve .... Use Every 4 Hours As Needed  11)  Mucus Relief 400 Mg  Tabs (Guaifenesin) .... 2-3 Every 12 Hours As Needed 12)  Aleve 220 Mg Tabs (Naproxen Sodium) .... As Needed Arthritis Pain Per Bottle  Allergies (verified): No Known Drug Allergies  Past History:  Past Medical History: ASTHMA (ICD-493.90)   --chronic severe asthma  with best FEV1 2.2 L documented 2/02 and       documented non-adherence  Mainly lives off samples of inhalers and nose sprays    - HFA  75% June 15, 2009 > 90% August 02, 2009 > 100% December 27, 2009     - Off allergy vaccines x7/2010   > highly allergic skin tests per Dr Maple Hudson > restart vaccine November 06, 2009  SHOULDER PAIN, RIGHT (ICD-719.41) FREQUENCY, URINARY (ICD-788.41) HYPERTENSION, BORDERLINE (ICD-401.9) Shingles T 8 Left 9/01 HEALTH MAINTENANCE.............................................................................Marland KitchenWert    - Colonoscopy 12/1989    - dT  4/06    - Pneumovax 3/04 and 05/2009     -Flu declined  October 17, 2008 and September 21, 2009      -CPX  May 31, 2009     - Meds reviewed with pt education and computerized med calendar adjusted  October 19, 2009, February 20, 2010      Vital Signs:  Patient profile:   70 year old male Weight:      209 pounds O2 Sat:      94 % on Room air Temp:     97.7 degrees F oral Pulse rate:   77 / minute BP sitting:   112 / 64  (left arm)  Vitals Entered By: Renold Genta RCP, LPN (May 03, 2010 9:55 AM)  O2 Flow:  Room air  Physical Exam  Additional Exam:  General: A/Ox3; pleasant and cooperative, a bit more anxious than baseline, still somewhat childlike affect  205 November 06, 2009  >  206 February 06, 2010 >>202 February 20, 2010 > 205 March 20, 2010 > 209 May 03, 2010  SKIN: no rash, lesions NODES: no lymphadenopathy HEENT: [place]/AT, EOM- WNL, Conjuctivae- clear, PERRLA, TM-WNL, Nose- clear, Throat- clear and wnl NECK: Supple w/ fair ROM, JVD- none, normal carotid impulses w/o bruits Thyroid- CHEST: CTA no wheezing except @ fvc HEART: RRR, no m/g/r heard no edema ABDOMEN: Soft and nl; neg cvat ZOX:WRUE, nl pulses, no clubbing, no calf tenderness   Impression & Recommendations:  Problem # 1:  ASTHMA (ICD-493.90)  DDX of  difficult airways managment all start with A and  include Adherence, Ace Inhibitors, Acid Reflux, Active Sinus Disease, Alpha 1 Antitripsin deficiency, Anxiety masquerading as Airways dz,  ABPA,  allergy(esp in young), Aspiration (esp in elderly), Adverse effects of DPI,  Active smokers, plus one B  = Beta blocker use..   Adherence seems better on dulera, counts correlate  Active sinus dz:  continue  nasonex (having trouble affording)  Allergies:  Per Dr Maple Hudson  Acid reflux:  reviewed diet/ ppi importance   Each maintenance medication was reviewed in detail including most importantly the difference between maintenance and as needed and under what circumstances the prns are to be used. This was done in the context of a medication calendar review which provided the patient with a user-friendly unambiguous mechanism for medication administration and reconciliation and provides an action plan for all active problems. It is critical that this be shown to every doctor  for modification during the office visit if necessary so the patient can use it as a working document.  Other Orders: Est. Patient Level III (16109)  Patient Instructions: 1)  See calendar for specific medication instructions and bring it back for each and every office visit for every healthcare provider you see.  Without it,  you may not receive the best quality medical care that we feel you deserve.  2)  Please schedule a follow-up appointment in 6 weeks, sooner if needed  3)  Dulera count is 118

## 2011-01-24 NOTE — Miscellaneous (Signed)
Summary: Injection Record/[facility]  Injection Record/[facility]   Imported By: Sherian Rein 05/15/2010 13:09:50  _____________________________________________________________________  External Attachment:    Type:   Image     Comment:   External Document

## 2011-01-24 NOTE — Assessment & Plan Note (Signed)
Summary: Primary svc/ f/u ov   Copy to:  Dr. Romeo Apple Primary Provider/Referring Provider:  none  CC:  Cough- improved.  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   January 11, 2010- Allergic asthma Doing ok building allergy vaccine here. No reactions or problems. Asthma is "as good as it gets" now. Somedays he is clear with no wheeze, then weather  changes and he may wheeze a little bit. It doesn't keep him awake or keep him from doing what he wants to do. Sometimes productive cough.   May 16, 2010- Allergic asthma, Rhinitis Returning for allergy f/u. Sees Dr Sherene Sires regularly for primary pulmonary. He has built allergy vaccine here to maintenance at 1:50 with no reactions. He says rhinitis and asthma have done well through the Spring pollen season. Needs refill Nasonex. Last used Xopenex rescue inhaler 2-3 days ago. Denies nasal congestion, sneeze, blowing or headache.  November 12, 2010- - Allergic asthma, Rhinitis Nurse-CC: follow up visit-allergies. Due to advance to 1:10 w/ next vaccineorder. Says he's doing ok. Declines flu vax. No seasonal flare of asthma- no ER trips or prednisone. Ne recent need for either nebulizer or rescue inhaler  November 19, 2010 ov cc cough and congestion a little worse x sev days.  December 11, 2010 ov cough better, breathing ok DULERA Count  121  > count is 39  should be 34, no sob over baseline or need for rescue.   rec no change  January 09, 2011 ov cough and sob better. dulera count 64 denies rescue need or noct complaints. Pt denies any significant sore throat, dysphagia, itching, sneezing,  nasal congestion or excess secretions,  fever, chills, sweats, unintended wt loss, pleuritic or exertional cp, hempoptysis, change in activity tolerance  orthopnea pnd or leg swelling.       Current Medications (verified): 1)  Nasonex 50 Mcg/act  Susp (Mometasone Furoate) .Marland Kitchen.. 1 Puff in Each Notril Two  Times A Day 2)  Dulera 200-5 Mcg/act Aero (Mometasone Furo-Formoterol Fum) .... 2 Puffs First Thing  in Am and 2 Puffs Again in Pm About 12 Hours Later 3)  Adult Aspirin Ec Low Strength 81 Mg  Tbec (Aspirin) .... Take 1 Tablet By Mouth Once A Day 4)  Pepcid 20 Mg Tabs (Famotidine) .Marland Kitchen.. 1 Tab By Mouth in The Morning and 1 At Bedtime 5)  Citrucel  Powd (Methylcellulose (Laxative)) .Marland Kitchen.. 1 Teaspoon in The Morning and 1 At Bedtime 6)  Allergy Vaccine  1:10 Gh .... Next Order 7)  Xopenex Hfa 45 Mcg/act Aero (Levalbuterol Tartrate) .... 2 Puffs Every 4 Hours As Needed 8)  Albuterol Sulfate (2.5 Mg/40ml) 0.083%  Nebu (Albuterol Sulfate) .Marland Kitchen.. 1 Treatmen Every 4-6 Hours As Needed 9)  Flutter Valve .... Use Every 4 Hours As Needed 10)  Mucus Relief 400 Mg  Tabs (Guaifenesin) .... 2-3 Every 12 Hours As Needed 11)  Aleve 220 Mg Tabs (Naproxen Sodium) .... Per Bottle As Needed For Joint Pain 12)  Gas-X Extra Strength 125 Mg Caps (Simethicone) .... Per Box As Needed Gas or Bloating 13)  Chloraseptic Sore Throat 1000 Mg/10ml Liqd (Acetaminophen) .... As Directed Per Bottle As Needed  Allergies (verified): No Known Drug Allergies  Past History:  Past Medical History: ASTHMA (ICD-493.90)   --chronic severe asthma  with best FEV1 2.2 L documented 2/202 and       documented non-adherence  Mainly lives off samples of inhalers and nose sprays    -  HFA  75% June 15, 2009 > 90% August 02, 2009 > 100% December 27, 2009     - Off allergy vaccines x7/2010   > highly allergic skin tests per Dr Maple Hudson > restart vaccine November 06, 2009  SHOULDER PAIN, RIGHT (ICD-719.41) FREQUENCY, URINARY (ICD-788.41) HYPERTENSION, BORDERLINE (ICD-401.9) Shingles T 8 Left 9/01 HEALTH MAINTENANCE.............................................................................................Marland KitchenWert    - Colonoscopy 12/1989    - dT  4/06    - Pneumovax 3/04 and 05/2009     -Flu declined  October 17, 2008 and September 21, 2009      -CPX    May 31, 2009     - Meds reviewed with pt education and computerized med calendar adjusted  October 19, 2009, February 20, 2010, September 10, 2010       Vital Signs:  Patient profile:   70 year old male Weight:      202.13 pounds O2 Sat:      92 % on Room air Temp:     97.2 degrees F oral Pulse rate:   82 / minute BP sitting:   138 / 80  (left arm)  Vitals Entered By: Vernie Murders (January 09, 2011 8:59 AM)  O2 Flow:  Room air  Physical Exam  Additional Exam:  wt  205 November 06, 2009    > 203 December 11, 2010 >  203 December 11, 2010 > 202 January 09, 2011  SKIN: no rash, lesions NODES: no lymphadenopathy ,Periorbital edema,  TM-WNL, Nose- narrow on right/ septal deviation, Throat- clear and wnl, no erythema or drainage   NECK: Supple w/ fair ROM, JVD- none, normal carotid impulses w/o bruits Thyroid-, not hoarse CHEST: Prolonged expiratory time, no sign wheeze HEART: RRR, no m/g/r heard no edema ABDOMEN: Soft and nl;  EAV:WUJW, nl pulses, no clubbing, no calf tenderness     Impression & Recommendations:  Problem # 1:  ASTHMA (ICD-493.90) All goals of asthma met including optimal function and elimination of symptoms with minimum need for rescue therapy. Contingencies discussed today including the rule of two's.   continue to be concerned with adherence on multiple levels.   Each maintenance medication was reviewed in detail including most importantly the difference between maintenance and as needed and under what circumstances the prns are to be used.  This was done in the context of a medication calendar review which provided the patient with a user-friendly unambiguous mechanism for medication administration and reconciliation and provides an action plan for all active problems. It is critical that this be shown to every doctor  for modification during the office visit if necessary so the patient can use it as a working document. See instructions for specific  recommendations   Other Orders: Est. Patient Level III (11914)  Patient Instructions: 1)  See calendar for specific medication instructions and bring it back for each and every office visit for every healthcare provider you see.  Without it,  you may not receive the best quality medical care that we feel you deserve.  2)  DULERA count is 64 and since this is the only one you have we will give you a second one and see you in 6 weeks

## 2011-01-24 NOTE — Progress Notes (Signed)
Summary: nos appt  Phone Note Call from Patient   Caller: [email or web address]  Call For: wert Summary of Call: Rsc nos from 8/1 to 8/9 @ 9:45a. Initial call taken by: Darletta Moll,  July 24, 2010 10:11 AM

## 2011-01-24 NOTE — Medication Information (Signed)
Summary: Tax adviser   Imported By: Lehman Prom 03/05/2010 14:39:07  _____________________________________________________________________  External Attachment:    Type:   Image     Comment:   External Document

## 2011-01-24 NOTE — Progress Notes (Signed)
Summary: refill  Phone Note Call from Patient Call back at [facility] Phone [phone]   Caller: Patient Call For: wert Reason for Call: Refill Medication Summary of Call: Pt c/o up coughing and spitting up green mucous since Tues, wants abx also request refill on his albuterol sulfate.//[facility] Initial call taken by: Darletta Moll,  November 23, 2010 9:19 AM  Follow-up for Phone Call        Spoke with pt and he c/o productive cough with green phlegm, chest congestion x 3 days. Pt denies sob, wheezing. he has had a fever of 100 as well. Please advsie. Carron Curie CMA  November 23, 2010 10:28 AM    Additional Follow-up for Phone Call Additional follow up Details #1::        rx augmentin 875mg  two times a day (generic) x 7days ok to refill albuterol Additional Follow-up by: Storm Frisk MD,  November 23, 2010 11:13 AM    Additional Follow-up for Phone Call Additional follow up Details #2::    Pt aware. rx's sent. Zackery Barefoot CMA  November 23, 2010 11:23 AM   New/Updated Medications: AUGMENTIN 875-125 MG TABS (AMOXICILLIN-POT CLAVULANATE) Take 1 tablet by mouth two times a day Prescriptions: ALBUTEROL SULFATE (2.5 MG/3ML) 0.083%  NEBU (ALBUTEROL SULFATE) 1 treatmen every 4-6 hours as needed  #120 x 1   Entered by:   Zackery Barefoot CMA   Authorized by:   Nyoka Cowden MD   Signed by:   Zackery Barefoot CMA on 11/23/2010   Method used:   Electronically to        [facility] [address] [phone]* (retail)       [address]       [address]       [place], [place]  [address]       Ph: [phone]       Fax: [phone]   RxID:   [id number] AUGMENTIN 875-125 MG TABS (AMOXICILLIN-POT CLAVULANATE) Take 1 tablet by mouth two times a day  #14 x 0   Entered by:   Zackery Barefoot CMA   Authorized by:   Storm Frisk MD   Signed by:   Zackery Barefoot CMA on 11/23/2010   Method used:   Electronically to        [facility] [address] [phone]*  (retail)       [address]       [address]       [place], [place]  [address]       Ph: [phone]       Fax: [phone]   RxID:   [id number]

## 2011-01-24 NOTE — Assessment & Plan Note (Signed)
Summary: NP follow up - med calendar   Copy to:  Dr. Romeo Apple [name]/Referring [name]:  none  CC:  est med calendar - pt brought all meds with him today.  no new complaints.  History of Present Illness: 69yobm never smoker  born prematurely with asthma since age 58s.   October 25, 2009- Allergic asthma Off antihistamines, returning at his request for allergy re-testing. Feels well. He always wheezes some and admits cooler weather makes this a little worse. Denies fever, purulent, chest pain or palpitation.  Skin Test- Strongly Pos grass, dust mite and endodermals.   January 11, 2010- Allergic asthma Doing ok building allergy vaccine here. No reactions or problems. Asthma is "as good as it gets" now. Somedays he is clear with no wheeze, then weather  changes and he may wheeze a little bit. It doesn't keep him awake or keep him from doing what he wants to do. Sometimes productive cough.   May 16, 2010- Allergic asthma, Rhinitis Returning for allergy f/u. Sees Dr Sherene Sires regularly for primary pulmonary. He has built allergy vaccine here to maintenance at 1:50 with no reactions. He says rhinitis and asthma have done well through the Spring pollen season. Needs refill Nasonex. Last used Xopenex rescue inhaler 2-3 days ago.  June 18, 2010 6 wk followup.  Pt states that overall his breathing has been doing well.  No complaints today. rare need for xopnenex hfa and never needs alb neb. rec no change in rx  July 31, 2010 ov Followup.  Pt c/o cough x 2 days- non prod.  Chest feels sore from cough. dulera count is accurate.  used neb once in last 48 hours better. no purulent sputum, ex or lateralizing cp. mild d dyspagia not consistent with ppi as per calendar.   prednisone x 6 days > better, no change in rcx   page 2  September 03, 2010 4 week followup, cough is better.  C/o new onset x 3 weeks almost daily  burning sensation left arm shoulder area and mid chest heart flluttering -  last about 5 minutes,  tends to be more often noted while sitting still,  no nausea, assoc with abd loating    Happening day in am while sitting.  >>?IBS >>Rec to stop prilosec and caltrate, begin pepcid two times a day , and add citrucel. CXR w/ no acute changes and cardiac panel unremarkable.   September 10, 2010--Returns for follow up and med review. Last visit w/ atypical chest pain w/ abd bloating and gas Improved w/ change in meds to pepcid and citrucel. Today he brought in a new Dulera inhaler . 1 week ago inhaler read 66 doses left. He says his inhaler stop working and he had to open new inhaler. New inhaler reads 112 doses left. We reviwed his  meds and organized his meds into a  med calendar. Denies chest pain, dyspnea, orthopnea, hemoptysis, fever, n/v/d, edema, headache.  Last couple of days has noticed w/ cold weather and rain that he has been wheezing. No discolored mucus.   Medications Prior to Update: 1)  Nasonex 50 Mcg/act  Susp (Mometasone Furoate) .Marland Kitchen.. 1 Puff in Each Notril Two Times A Day 2)  Dulera 200-5 Mcg/act Aero (Mometasone Furo-Formoterol Fum) .... 2 Puffs First Thing  in Am and 2 Puffs Again in Pm About 12 Hours Later 3)  Adult Aspirin Ec Low Strength 81 Mg  Tbec (Aspirin) .... Take 1 Tablet By Mouth Once A Day 4)  Zegerid 40-1100 Mg Caps (Omeprazole-Sodium Bicarbonate) .Marland Kitchen.. 1 Before 1st Meal of Day and At Bedtime 5)  Calcium Carbonate-Vitamin D 600-400 Mg-Unit  Tabs (Calcium Carbonate-Vitamin D) .... Take 1 Tablet By Mouth Two Times A Day 6)  Allergy Vaccine Building-Up 7)  Xopenex Hfa 45 Mcg/act Aero (Levalbuterol Tartrate) .Marland Kitchen.. 1-2 Puffs Every 4-6 Hours As Needed 8)  Albuterol Sulfate (2.5 Mg/9ml) 0.083%  Nebu (Albuterol Sulfate) .Marland Kitchen.. 1 Treatmen Every 4-6 Hours As Needed 9)  Xopenex 1.25 Mg/64ml Nebu (Levalbuterol Hcl) .... One Treatment Every 4-6 Hours As Needed 10)  Flutter Valve .... Use Every 4 Hours As Needed 11)  Mucus Relief 400 Mg  Tabs (Guaifenesin) .... 2-3  Every 12 Hours As Needed 12)  Aleve 220 Mg Tabs (Naproxen Sodium) .... As Needed Arthritis Pain Per Bottle  Allergies (verified): No Known Drug Allergies  Past History:  Family History: Last updated: 05/31/2009 positive for asthma in sister positive for heart disease in mother brown lung in father lung cancer brother smoker  Social History: Last updated: 05/31/2009 Denies ever smoking No ETOH Retired  Risk Factors: Smoking Status: never (09/03/2010)  Past Medical History: ASTHMA (ICD-493.90)   --chronic severe asthma  with best FEV1 2.2 L documented 2/202 and       documented non-adherence  Mainly lives off samples of inhalers and nose sprays    - HFA  75% June 15, 2009 > 90% August 02, 2009 > 100% December 27, 2009     - Off allergy vaccines x7/2010   > highly allergic skin tests per Dr Maple Hudson > restart vaccine November 06, 2009  SHOULDER PAIN, RIGHT (ICD-719.41) FREQUENCY, URINARY (ICD-788.41) HYPERTENSION, BORDERLINE (ICD-401.9) Shingles T 8 Left 9/01 HEALTH MAINTENANCE...................................................................................Marland KitchenWert    - Colonoscopy 12/1989    - dT  4/06    - Pneumovax 3/04 and 05/2009     -Flu declined  October 17, 2008 and September 21, 2009      -CPX   May 31, 2009     - Meds reviewed with pt education and computerized med calendar adjusted  October 19, 2009, February 20, 2010, September 10, 2010       Review of Systems      See HPI  Vital Signs:  Patient profile:   70 year old male Height:      68 inches Weight:      205 pounds BMI:     31.28 O2 Sat:      95 % on Room air Temp:     96.5 degrees F oral Pulse rate:   76 / minute BP sitting:   124 / 86  (left arm) Cuff size:   regular  Vitals Entered By: Boone Master CNA/MA (September 10, 2010 9:11 AM)  O2 Flow:  Room air CC: est med calendar - pt brought all meds with him today.  no new complaints Is Patient Diabetic? No Comments Medications reviewed with  patient Daytime contact number verified with patient. Boone Master CNA/MA  September 10, 2010 9:11 AM    Physical Exam  Additional Exam:   205 November 06, 2009  >   > 205 March 20, 2010 > 209 May 03, 2010 > 206 July 31, 2010 > 205 September 03, 2010 >>205 September 10, 2010  SKIN: no rash, lesions NODES: no lymphadenopathy HEENT: Paul/AT, EOM- WNL, Conjuctivae- clear, PERRLA, TM-WNL, Nose- narrow on right/ septal deviation, Throat- clear and wnl, no erythema or drainage  Mallampati  III NECK: Supple w/ fair  ROM, JVD- none, normal carotid impulses w/o bruits Thyroid-, not hoarse CHEST: Prolonged expiratory phase, miild  exp wheeze HEART: RRR, no m/g/r heard no edema ABDOMEN: Soft and nl;  EAV:WUJW, nl pulses, no clubbing, no calf tenderness Skin warm and dry   Impression & Recommendations:  Problem # 1:  ASTHMA (ICD-493.90)  Mild flare. Pt given xopenex neb in office  steroid taper to have on hold if symptoms worsen.  Meds reviewed with pt education and computerized med calendar adjusted.  Please contact office for sooner follow up if symptoms do not improve or worsen  declined flu shot.   Orders: Est. Patient Level IV (11914)  Patient Instructions: 1)  Follow med calendar  closely and bring to each visit.  2)  Use albuterol /xopenex if wheezing persists.  3)  Prednisone taper to have on hold if wheezing does not improve 4)  Please contact office for sooner follow up if symptoms do not improve or worsen  5)  follow up Dr. Sherene Sires in 6 weeks     Appended Document: Orders Update - neb tx    Clinical Lists Changes  Orders: Added new Service order of Nebulizer Tx (78295) - Signed      Appended Document: med calendar update    Clinical Lists Changes  Medications: Added new medication of PEPCID 20 MG TABS (FAMOTIDINE) 1 tab by mouth in the morning and 1 at bedtime Added new medication of CITRUCEL  POWD (METHYLCELLULOSE (LAXATIVE)) 1 teaspoon in the morning and 1  at bedtime Changed medication from [facility] HFA 45 MCG/ACT AERO (LEVALBUTEROL TARTRATE) 1-2 puffs every 4-6 hours as needed to XOPENEX HFA 45 MCG/ACT AERO (LEVALBUTEROL TARTRATE) 2 puffs every 4 hours as needed Changed medication from ALEVE 220 MG TABS (NAPROXEN SODIUM) as needed arthritis pain per bottle to ALEVE 220 MG TABS (NAPROXEN SODIUM) per bottle as needed for joint pain Added new medication of GAS-X EXTRA STRENGTH 125 MG CAPS (SIMETHICONE) per box as needed gas or bloating Removed medication of CALCIUM CARBONATE-VITAMIN D 600-400 MG-UNIT  TABS (CALCIUM CARBONATE-VITAMIN D) Take 1 tablet by mouth two times a day Removed medication of ZEGERID 40-1100 MG CAPS (OMEPRAZOLE-SODIUM BICARBONATE) 1 before 1st meal of day and at bedtime

## 2011-01-24 NOTE — Miscellaneous (Signed)
Summary: Injection Record / [facility]    Injection Record / [facility]    Imported By: Lennie Odor 09/12/2010 13:50:56  _____________________________________________________________________  External Attachment:    Type:   Image     Comment:   External Document

## 2011-01-24 NOTE — Assessment & Plan Note (Signed)
Summary: 2 months/apc   Copy to:  Dr. Romeo Apple Primary Provider/Referring Provider:  none  CC:  2 mo follow up.  no problems with breathing or allergies.  No complaints. .  History of Present Illness: September 21, 2009 ov worse sob/chest tightness off meds again, tells me now also off shots for months now. --refer to allergy   October 02, 2009- Allergic asthma He decided he wanted to restart shots bcause he has been more congested since being off.  --to return for allergy testing.   October 19, 2009--Returns for follow up and no complaints. Symbicort does not add up, says he had 2 samples, only 9 doses used out of current symbicort inhaler. pt ed given , med calnedar adjusted. No flare since last visit. Denies chest pain, dyspnea, orthopnea, hemoptysis, fever, n/v/d, edema, headache.   October 25, 2009- Allergic asthma Off antihistamines, returning at his request for allergy re-testing. Feels well. He always wheezes some and admits cooler weather makes this a little worse. Denies fever, purulent, chest pain or palpitation.  Skin Test- Strongly Pos grass, dust mite and endodermals.   January 11, 2010- Allergic asthma Doing ok building allergy vaccine here. No reactions or problems. Asthma is "as good as it gets" now. Somedays he is clear with no wheeze, then weather  changes and he may wheeze a little bit. It doesn't keep him awake or keep him from doing what he wants to do. Sometimes productive cough.     Current Medications (verified): 1)  Nasacort Aq 55 Mcg/act Aers (Triamcinolone Acetonide(Nasal)) .Marland Kitchen.. 1 Spray Each Nostril Two Times A Day 2)  Nasonex 50 Mcg/act  Susp (Mometasone Furoate) .Marland Kitchen.. 1 Puff in Each Notril Two Times A Day 3)  Symbicort 160-4.5 Mcg/act  Aero (Budesonide-Formoterol Fumarate) .... 2 Puffs Two Times A Day 4)  Adult Aspirin Ec Low Strength 81 Mg  Tbec (Aspirin) .... Take 1 Tablet By Mouth Once A Day 5)  Vitamin D 1000 Unit Tabs (Cholecalciferol) .... Take 1  Tablet By Mouth Once A Day 6)  Zegerid 40-1100 Mg Caps (Omeprazole-Sodium Bicarbonate) .... Take 1 Tablet By Mouth Once A Day Before Meals 7)  Ventolin Hfa 108 (90 Base) Mcg/act Aers (Albuterol Sulfate) .Marland Kitchen.. 1-2 Puffs Every 4-6 Hours As Needed 8)  Albuterol Sulfate (2.5 Mg/73ml) 0.083%  Nebu (Albuterol Sulfate) .... Use Every 4-6 Hours As Needed 9)  Xopenex 1.25 Mg/52ml Nebu (Levalbuterol Hcl) .... One Treatment Every 4-6 Hours As Needed 10)  Flutter Valve .... Use Every 4 Hours As Needed 11)  Mucus Relief 400 Mg  Tabs (Guaifenesin) .... 2-3 Every 12 Hours As Needed 12)  Aleve 220 Mg Tabs (Naproxen Sodium) .... As Needed Arthritis Pain Per Bottle 13)  Allergy Vaccine New Start Here  Allergies (verified): No Known Drug Allergies  Past History:  Past Medical History: Last updated: 12/27/2009 ASTHMA (ICD-493.90)   --chronic severe asthma  with best FEV1 2.2 L documented 2/02 and       documented non-adherence  Mainly lives off samples of inhalers and nose sprays    - HFA  75% June 15, 2009 > 90% August 02, 2009 > 100% December 27, 2009     - Off allergy vaccines x7/2010   > highly allergic skin tests per Dr Maple Hudson > restart vaccine November 06, 2009  SHOULDER PAIN, RIGHT (ICD-719.41) FREQUENCY, URINARY (ICD-788.41) HYPERTENSION, BORDERLINE (ICD-401.9) Shingles T 8 Left 9/01 HEALTH MAINTENANCE.......................................................................Marland KitchenWert    - Colonoscopy 12/1989    - dT  4/06    -  Pneumovax 3/04 and 05/2009     -Flu declined  October 17, 2008 and September 21, 2009      -CPX  May 31, 2009     - Meds reviewed with pt education and computerized med calendar adjusted  October 19, 2009      Family History: Last updated: 05/31/2009 positive for asthma in sister positive for heart disease in mother brown lung in father lung cancer brother smoker  Social History: Last updated: 05/31/2009 Denies ever smoking No ETOH Retired  Risk Factors: Smoking  Status: never (01/04/2008)  Past Surgical History: Tonsillectomy Umbilical hernia hemorrhoids  Review of Systems      See HPI       The patient complains of dyspnea on exertion.  The patient denies anorexia, fever, weight loss, weight gain, vision loss, decreased hearing, hoarseness, chest pain, syncope, peripheral edema, prolonged cough, headaches, hemoptysis, and severe indigestion/heartburn.         Some wheeze most days  Vital Signs:  Patient profile:   70 year old male Height:      68 inches Weight:      204.38 pounds BMI:     31.19 O2 Sat:      95 % on Room air Pulse rate:   78 / minute BP sitting:   130 / 86  (left arm) Cuff size:   regular  Vitals Entered By: Gweneth Dimitri RN (January 11, 2010 1:57 PM)  O2 Flow:  Room air CC: 2 mo follow up.  no problems with breathing or allergies.  No complaints.  Comments Medications reviewed with patient phone number verified with patient Gweneth Dimitri RN  January 11, 2010 1:57 PM    Physical Exam  Additional Exam:  General: A/Ox3; pleasant and cooperative, NAD, casual affect  wt 204 September 21, 2009 >> 205 November 06, 2009  > 199, Jan 20- 204 SKIN: no rash, lesions NODES: no lymphadenopathy HEENT: [place] Blue/AT, EOM- WNL, Conjuctivae- clear, PERRLA, TM-WNL, Nose- clear, Throat- clear and wnl NECK: Supple w/ fair ROM, JVD- none, normal carotid impulses w/o bruits Thyroid- CHEST: Unforced expiratory wheeze across low back and upper anterior chest. Not coughing. HEART: RRR, no m/g/r heard no edema ABDOMEN: Soft and nl; ZOX:WRUE, nl pulses, no clubbing, no calf tenderness   Impression & Recommendations:  Problem # 1:  ASTHMA (ICD-493.90)  Chronic asthma with fixed obstructive airways disease, some atopic component. Allergy vaccine was restarted at his choice and is building without problems. Note he always has some wheeze.   Orders: Est. Patient Level III (45409)  Problem # 2:  CHRONIC RHINITIS (ICD-472.0) Not  symptomatic now. Discussed and refilled nasonex. He will hold script for now, but likely to need it in the Spring.  Patient Instructions: 1)  Please schedule a follow-up appointment in 4 months. We will consider then whether to move up the strength of your allergy shots. 2)  Script refilled for Nasonex Prescriptions: NASONEX 50 MCG/ACT  SUSP (MOMETASONE FUROATE) 1 puff in each notril two times a day  #1 x prn   Entered and Authorized by:   Waymon Budge MD   Signed by:   Waymon Budge MD on 01/11/2010   Method used:   Print then Give to Patient   RxID:   [id number]

## 2011-01-24 NOTE — Assessment & Plan Note (Signed)
Summary: Primary svc/ f/u ov    Copy to:  Dr. Romeo Apple Primary Provider/Referring Provider:  none  CC:  Cough- some worse.  History of Present Illness: History of Present Illness: 70yobm never smoker  born prematurely with asthma since age 43s.   January 11, 2010- Allergic asthma Doing ok building allergy vaccine here. No reactions or problems. Asthma is "as good as it gets" now. Somedays he is clear with no wheeze, then weather  changes and he may wheeze a little bit. It doesn't keep him awake or keep him from doing what he wants to do. Sometimes productive cough.   May 16, 2010- Allergic asthma, Rhinitis Returning for allergy f/u. Sees Dr Sherene Sires regularly for primary pulmonary. He has built allergy vaccine here to maintenance at 1:50 with no reactions. He says rhinitis and asthma have done well through the Spring pollen season. Needs refill Nasonex. Last used Xopenex rescue inhaler 2-3 days ago. Denies nasal congestion, sneeze, blowing or headache.  November 12, 2010- - Allergic asthma, Rhinitis Nurse-CC: follow up visit-allergies. Due to advance to 1:10 w/ next vaccineorder. Says he's doing ok. Declines flu vax. No seasonal flare of asthma- no ER trips or prednisone. Ne recent need for either nebulizer or rescue inhaler  November 19, 2010 ov cc cough and congestion a little worse x sev days, no purulent sputum or increase need for saba. Pt denies any significant sore throat, dysphagia, itching, sneezing,  nasal congestion or excess secretions,  fever, chills, sweats, unintended wt loss, pleuritic or exertional cp, hempoptysis, change in activity tolerance  orthopnea pnd or leg swelling.     Current Medications (verified): 1)  Nasonex 50 Mcg/act  Susp (Mometasone Furoate) .Marland Kitchen.. 1 Puff in Each Notril Two Times A Day 2)  Dulera 200-5 Mcg/act Aero (Mometasone Furo-Formoterol Fum) .... 2 Puffs First Thing  in Am and 2 Puffs Again in Pm About 12 Hours Later 3)  Adult Aspirin Ec Low  Strength 81 Mg  Tbec (Aspirin) .... Take 1 Tablet By Mouth Once A Day 4)  Pepcid 20 Mg Tabs (Famotidine) .Marland Kitchen.. 1 Tab By Mouth in The Morning and 1 At Bedtime 5)  Citrucel  Powd (Methylcellulose (Laxative)) .Marland Kitchen.. 1 Teaspoon in The Morning and 1 At Bedtime 6)  Allergy Vaccine  1:10 Gh .... Next Order 7)  Xopenex Hfa 45 Mcg/act Aero (Levalbuterol Tartrate) .... 2 Puffs Every 4 Hours As Needed 8)  Albuterol Sulfate (2.5 Mg/3ml) 0.083%  Nebu (Albuterol Sulfate) .Marland Kitchen.. 1 Treatmen Every 4-6 Hours As Needed 9)  Flutter Valve .... Use Every 4 Hours As Needed 10)  Mucus Relief 400 Mg  Tabs (Guaifenesin) .... 2-3 Every 12 Hours As Needed 11)  Aleve 220 Mg Tabs (Naproxen Sodium) .... Per Bottle As Needed For Joint Pain 12)  Gas-X Extra Strength 125 Mg Caps (Simethicone) .... Per Box As Needed Gas or Bloating 13)  Chloraseptic Sore Throat 1000 Mg/41ml Liqd (Acetaminophen) .... As Directed Per Bottle As Needed  Allergies (verified): No Known Drug Allergies  Past History:  Past Medical History: ASTHMA (ICD-493.90)   --chronic severe asthma  with best FEV1 2.2 L documented 2/202 and       documented non-adherence  Mainly lives off samples of inhalers and nose sprays    - HFA  75% June 15, 2009 > 90% August 02, 2009 > 100% December 27, 2009     - Off allergy vaccines x7/2010   > highly allergic skin tests per Dr Maple Hudson > restart vaccine November 06, 2009  SHOULDER PAIN, RIGHT (ICD-719.41) FREQUENCY, URINARY (ICD-788.41) HYPERTENSION, BORDERLINE (ICD-401.9) Shingles T 8 Left 9/01 HEALTH MAINTENANCE........................................................................................Marland KitchenWert    - Colonoscopy 12/1989    - dT  4/06    - Pneumovax 3/04 and 05/2009     -Flu declined  October 17, 2008 and September 21, 2009      -CPX   May 31, 2009     - Meds reviewed with pt education and computerized med calendar adjusted  October 19, 2009, February 20, 2010, September 10, 2010       Vital Signs:  Patient  profile:   70 year old male Weight:      204 pounds O2 Sat:      93 % on Room air Temp:     97.7 degrees F oral Pulse rate:   82 / minute BP sitting:   118 / 76  (left arm)  Vitals Entered By: Vernie Murders (November 19, 2010 10:03 AM)  O2 Flow:  Room air  Physical Exam  Additional Exam:  wt  205 November 06, 2009   > 206 July 31, 2010  > 206 October 22, 2010 > 204 November 19, 2010  SKIN: no rash, lesions NODES: no lymphadenopathy ,Periorbital edema,  TM-WNL, Nose- narrow on right/ septal deviation, Throat- clear and wnl, no erythema or drainage   NECK: Supple w/ fair ROM, JVD- none, normal carotid impulses w/o bruits Thyroid-, not hoarse CHEST: Prolonged expiratory phase, coarse sounds in bases but no wheeze or cough HEART: RRR, no m/g/r heard no edema ABDOMEN: Soft and nl;  BJY:NWGN, nl pulses, no clubbing, no calf tenderness     Impression & Recommendations:  Problem # 1:  ASTHMA (ICD-493.90) All goals of asthma met including optimal function and elimination of symptoms with minimum need for rescue therapy. Contingencies discussed today including the rule of two's.   Each maintenance medication was reviewed in detail including most importantly the difference between maintenance and as needed and under what circumstances the prns are to be used.  See instructions for specific recommendations   Medications Added to Medication List This Visit: 1)  Nasonex 50 Mcg/act Susp (Mometasone furoate) .Marland Kitchen.. 1 puff in each notril two times a day  Other Orders: Est. Patient Level III (56213) Prescription Created Electronically [id number])  Patient Instructions: 1)  See calendar for specific medication instructions and bring it back for each and every office visit for every healthcare provider you see.  Without it,  you may not receive the best quality medical care that we feel you deserve.  2)  Please schedule a follow-up appointment in 3 weeks, sooner if needed  3)  ADD DULERA Count is  121  Prescriptions: NASONEX 50 MCG/ACT  SUSP (MOMETASONE FUROATE) 1 puff in each notril two times a day  #1 x 11   Entered and Authorized by:   Nyoka Cowden MD   Signed by:   Nyoka Cowden MD on 11/19/2010   Method used:   Electronically to        [facility] [phone]* (retail)       [address]       [address]       [place], [place]  [address]       Ph: [phone]       Fax: [phone]   RxID:   [id number]

## 2011-01-24 NOTE — Assessment & Plan Note (Signed)
Summary: 6 months/apc   Copy to:  Dr. Romeo Apple Primary Cliford Sequeira/Referring Trust Crago:  none  CC:  follow up visit-allergies. and Headache.  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   January 11, 2010- Allergic asthma Doing ok building allergy vaccine here. No reactions or problems. Asthma is "as good as it gets" now. Somedays he is clear with no wheeze, then weather  changes and he may wheeze a little bit. It doesn't keep him awake or keep him from doing what he wants to do. Sometimes productive cough.   May 16, 2010- Allergic asthma, Rhinitis Returning for allergy f/u. Sees Dr Sherene Sires regularly for primary pulmonary. He has built allergy vaccine here to maintenance at 1:50 with no reactions. He says rhinitis and asthma have done well through the Spring pollen season. Needs refill Nasonex. Last used Xopenex rescue inhaler 2-3 days ago. Denies nasal congestion, sneeze, blowing or headache.  November 12, 2010- - Allergic asthma, Rhinitis Nurse-CC: follow up visit-allergies. Due to advance to 1:10 w/ next vaccineorder. Says he's doing ok. Declines flu vax. No seasonal flare of asthma- no ER trips or prednisone. Ne recent need for either nebulizer or rescue inhaler      Asthma History    Initial Asthma Severity Rating:    Age range: 12+ years    Symptoms: 0-2 days/week    Nighttime Awakenings: 0-2/month    Interferes w/ normal activity: no limitations    SABA use (not for EIB): 0-2 days/week    Asthma Severity Assessment: Intermittent    Preventive Screening-Counseling & Management  Alcohol-Tobacco     Smoking Status: never  Current Medications (verified): 1)  Nasonex 50 Mcg/act  Susp (Mometasone Furoate) .Marland Kitchen.. 1 Puff in Each Notril Two Times A Day 2)  Dulera 200-5 Mcg/act Aero (Mometasone Furo-Formoterol Fum) .... 2 Puffs First Thing  in Am and 2 Puffs Again in Pm About 12 Hours Later 3)  Adult  Aspirin Ec Low Strength 81 Mg  Tbec (Aspirin) .... Take 1 Tablet By Mouth Once A Day 4)  Pepcid 20 Mg Tabs (Famotidine) .Marland Kitchen.. 1 Tab By Mouth in The Morning and 1 At Bedtime 5)  Citrucel  Powd (Methylcellulose (Laxative)) .Marland Kitchen.. 1 Teaspoon in The Morning and 1 At Bedtime 6)  Xopenex Hfa 45 Mcg/act Aero (Levalbuterol Tartrate) .... 2 Puffs Every 4 Hours As Needed 7)  Albuterol Sulfate (2.5 Mg/40ml) 0.083%  Nebu (Albuterol Sulfate) .Marland Kitchen.. 1 Treatmen Every 4-6 Hours As Needed 8)  Xopenex 1.25 Mg/44ml Nebu (Levalbuterol Hcl) .... One Treatment Every 4-6 Hours As Needed 9)  Flutter Valve .... Use Every 4 Hours As Needed 10)  Mucus Relief 400 Mg  Tabs (Guaifenesin) .... 2-3 Every 12 Hours As Needed 11)  Aleve 220 Mg Tabs (Naproxen Sodium) .... Per Bottle As Needed For Joint Pain 12)  Gas-X Extra Strength 125 Mg Caps (Simethicone) .... Per Box As Needed Gas or Bloating 13)  Allergy Vaccine Building-Up 14)  Chloraseptic Sore Throat 1000 Mg/22ml Liqd (Acetaminophen) .... As Directed Per Bottle As Needed  Allergies (verified): No Known Drug Allergies  Past History:  Past Medical History: Last updated: 10/22/2010 ASTHMA (ICD-493.90)   --chronic severe asthma  with best FEV1 2.2 L documented 2/202 and       documented non-adherence  Mainly lives off samples of inhalers and nose sprays    - HFA  75% June 15, 2009 > 90% August 02, 2009 > 100% December 27, 2009     - Off allergy vaccines x7/2010   > highly allergic skin tests per Dr Maple Hudson > restart vaccine November 06, 2009  SHOULDER PAIN, RIGHT (ICD-719.41) FREQUENCY, URINARY (ICD-788.41) HYPERTENSION, BORDERLINE (ICD-401.9) Shingles T 8 Left 9/01 HEALTH MAINTENANCE......................................................................................Marland KitchenWert    - Colonoscopy 12/1989    - dT  4/06    - Pneumovax 3/04 and 05/2009     -Flu declined  October 17, 2008 and September 21, 2009      -CPX   May 31, 2009     - Meds reviewed with pt education and  computerized med calendar adjusted  October 19, 2009, February 20, 2010, September 10, 2010       Past Surgical History: Last updated: 01/11/2010 Tonsillectomy Umbilical hernia hemorrhoids  Family History: Last updated: 05/31/2009 positive for asthma in sister positive for heart disease in mother brown lung in father lung cancer brother smoker  Social History: Last updated: 05/31/2009 Denies ever smoking No ETOH Retired  Risk Factors: Smoking Status: never (11/12/2010)  Review of Systems      See HPI  The patient denies shortness of breath with activity, shortness of breath at rest, productive cough, non-productive cough, coughing up blood, chest pain, irregular heartbeats, acid heartburn, indigestion, loss of appetite, weight change, abdominal pain, difficulty swallowing, sore throat, tooth/dental problems, headaches, nasal congestion/difficulty breathing through nose, and sneezing.    Vital Signs:  Patient profile:   70 year old male Height:      68 inches Weight:      206.13 pounds BMI:     31.46 O2 Sat:      95 % on Room air Pulse rate:   69 / minute BP sitting:   128 / 78  (left arm) Cuff size:   regular  Vitals Entered By: Reynaldo Minium CMA (November 12, 2010 8:58 AM)  O2 Flow:  Room air CC: follow up visit-allergies., Headache   Physical Exam  Additional Exam:   205 November 06, 2009   > 206 July 31, 2010 > 205 September 10, 2010 >>205 09/13/10 > 206 October 22, 2010  SKIN: no rash, lesions NODES: no lymphadenopathy ,Periorbital edema,  TM-WNL, Nose- narrow on right/ septal deviation, Throat- clear and wnl, no erythema or drainage  Mallampati  III NECK: Supple w/ fair ROM, JVD- none, normal carotid impulses w/o bruits Thyroid-, not hoarse CHEST: Prolonged expiratory phase, coarse sounds in bases but no wheeze or cough HEART: RRR, no m/g/r heard no edema ABDOMEN: Soft and nl;  MWU:XLKG, nl pulses, no clubbing, no calf tenderness     Impression &  Recommendations:  Problem # 1:  CHRONIC RHINITIS (ICD-472.0)  He denies active problems for now. Has Nasonex whern needed.  He has periorbital edema but is not aware of nasal congestion. This may be familial.  Orders: Est. Patient Level IV (40102)  Problem # 2:  ASTHMA (ICD-493.90) Good control. He is tolerating allergy vaccine well and is ready to advance to 1:10 with next order.   Medications Added to Medication List This Visit: 1)  Allergy Vaccine 1:10 Gh  .... Next order  Patient Instructions: 1)  Please schedule a follow-up appointment in 6 months. 2)  We will be advancing the strength of your allergy vaccine to 1:10 with next order.  3)  Continue present meds. Follow-up with Dr Sherene Sires as scheduled for general pulmonary care.  4)  Happy Thanksgiving !

## 2011-01-24 NOTE — Progress Notes (Signed)
Summary: prednisone request-rx for pred taper  Phone Note Call from Patient Call back at Home Phone [phone]   Caller: Patient Call For: tammy parrett Summary of Call: request prednisone taper- [facility] on [address] (pt was seen 09/10/10.  Initial call taken by: Tivis Ringer, CNA,  September 12, 2010 4:22 PM  Follow-up for Phone Call        Pt advised Prednisone taper was not called to pharmacy. Same has not been called to pharmacy, however, I advised pt per 9-19 OV he is only to pick up same if his symptoms are worse. I asked pt if symptoms are worse, pt advised no and that he misunderstood TP. will forward to TP for review. Zackery Barefoot CMA  September 12, 2010 4:34 PM   pt said his mucus has now changed colors needs something.Lacinda Axon  September 13, 2010 11:26 AM  Additional Follow-up for Phone Call Additional follow up Details #1::        Spoke with pt.  He is requesting a pred taper be sent to pharm.  He was seen on 9/19 and told he could have this if needed if wheezing did not improve.  He states that wheezing has improved some, but now has prod cough with thick, yellow sputum and states "think I had high fever, but did'nt check it".  Pls advise thanks Additional Follow-up by: Vernie Murders,  September 13, 2010 11:34 AM    Additional Follow-up for Phone Call Additional follow up Details #2::    prednisone 10mg  4 tabs for 2 days, then 3 tabs for 2 days, 2 tabs for 2 days, then 1 tab for 2 days, then stop #20, no refills  Please contact office for sooner follow up if symptoms do not improve or worsen   Follow-up by: Rubye Oaks NP,  September 13, 2010 1:17 PM  Additional Follow-up for Phone Call Additional follow up Details #3:: Details for Additional Follow-up Action Taken: Rx for pred taper sent.  LMOMTCB. Vernie Murders  September 13, 2010 1:26 PM    pt walked in to the office today and is aware his pred taper is at the pharmacy for him to pick up.  Megan  Reynolds LPN  September 13, 2010 3:11 PM   New/Updated Medications: PREDNISONE 10 MG TABS (PREDNISONE) 4 each am x 2 days, 3 each am x 2 days, 2 each am x 2 days, 1 each am x 2 days, then stop Prescriptions: PREDNISONE 10 MG TABS (PREDNISONE) 4 each am x 2 days, 3 each am x 2 days, 2 each am x 2 days, 1 each am x 2 days, then stop  #20 x 0   Entered by:   Vernie Murders   Authorized by:   Rubye Oaks NP   Signed by:   Vernie Murders on 09/13/2010   Method used:   Electronically to        [facility] [address] [phone]* (retail)       [address]       [address]       [place], [place]  [address]       Ph: [phone]       Fax: [phone]   RxID:   [id number]

## 2011-01-24 NOTE — Assessment & Plan Note (Signed)
Summary: Pulmonary/ acute ext ov, change to dulera 200   Copy to:  Dr. Romeo Apple Primary Provider/Referring Provider:  none  CC:  Acute visit.  Pt c/o wheezing x 2 days.  He also c/o soreness across chest and back.  Also has prod cough with cleatr sputum.Marland Kitchen  History of Present Illness: September 21, 2009 ov worse sob/chest tightness off meds again, tells me now also off shots for months now. --refer to allergy   October 02, 2009- Allergic asthma He decided he wanted to restart shots bcause he has been more congested since being off.  --to return for allergy testing.   October 19, 2009--Returns for follow up and no complaints. Symbicort does not add up, says he had 2 samples, only 9 doses used out of current symbicort inhaler. pt ed given , med calnedar adjusted. No flare since last visit. Denies chest pain, dyspnea, orthopnea, hemoptysis, fever, n/v/d, edema, headache.   October 25, 2009- Allergic asthma Off antihistamines, returning at his request for allergy re-testing. Feels well. He always wheezes some and admits cooler weather makes this a little worse. Denies fever, purulent, chest pain or palpitation.  Skin Test- Strongly Pos grass, dust mite and endodermals.   January 11, 2010- Allergic asthma Doing ok building allergy vaccine here. No reactions or problems. Asthma is "as good as it gets" now. Somedays he is clear with no wheeze, then weather  changes and he may wheeze a little bit. It doesn't keep him awake or keep him from doing what he wants to do. Sometimes productive cough.   January 26, 2010 Acute visit.  Pt c/o wheezing x 2 days.  He also c/o soreness across chest and back worse with cough, has had similar problems  in past and also has assoc mild dysphagia.   Also has prod cough with cleatr sputum.  Pt denies any significant sore throat,  itching, sneezing,  nasal congestion or excess secretions,  fever, chills, sweats, unintended wt loss, pleuritic or exertional cp,  hempoptysis, change in activity tolerance  orthopnea pnd or leg swelling. Pt also denies any obvious fluctuation in symptoms with weather or environmental change or other alleviating or aggravating factors.         Current Medications (verified): 1)  Nasacort Aq 55 Mcg/act Aers (Triamcinolone Acetonide(Nasal)) .Marland Kitchen.. 1 Spray Each Nostril Two Times A Day 2)  Nasonex 50 Mcg/act  Susp (Mometasone Furoate) .Marland Kitchen.. 1 Puff in Each Notril Two Times A Day 3)  Symbicort 160-4.5 Mcg/act  Aero (Budesonide-Formoterol Fumarate) .... 2 Puffs Two Times A Day 4)  Adult Aspirin Ec Low Strength 81 Mg  Tbec (Aspirin) .... Take 1 Tablet By Mouth Once A Day 5)  Vitamin D 1000 Unit Tabs (Cholecalciferol) .... Take 1 Tablet By Mouth Once A Day 6)  Zegerid 40-1100 Mg Caps (Omeprazole-Sodium Bicarbonate) .... Take 1 Tablet By Mouth Once A Day Before Meals 7)  Ventolin Hfa 108 (90 Base) Mcg/act Aers (Albuterol Sulfate) .Marland Kitchen.. 1-2 Puffs Every 4-6 Hours As Needed 8)  Albuterol Sulfate (2.5 Mg/21ml) 0.083%  Nebu (Albuterol Sulfate) .... Use Every 4-6 Hours As Needed 9)  Xopenex 1.25 Mg/72ml Nebu (Levalbuterol Hcl) .... One Treatment Every 4-6 Hours As Needed 10)  Flutter Valve .... Use Every 4 Hours As Needed 11)  Mucus Relief 400 Mg  Tabs (Guaifenesin) .... 2-3 Every 12 Hours As Needed 12)  Aleve 220 Mg Tabs (Naproxen Sodium) .... As Needed Arthritis Pain Per Bottle 13)  Allergy Vaccine New Start Here  Allergies (  verified): No Known Drug Allergies  Past History:  Past Medical History: ASTHMA (ICD-493.90)   --chronic severe asthma  with best FEV1 2.2 L documented 2/02 and       documented non-adherence  Mainly lives off samples of inhalers and nose sprays    - HFA  75% June 15, 2009 > 90% August 02, 2009 > 100% December 27, 2009     - Off allergy vaccines x7/2010   > highly allergic skin tests per Dr Maple Hudson > restart vaccine November 06, 2009  SHOULDER PAIN, RIGHT (ICD-719.41) FREQUENCY, URINARY (ICD-788.41) HYPERTENSION,  BORDERLINE (ICD-401.9) Shingles T 8 Left 9/01 HEALTH MAINTENANCE........................................................................Marland KitchenWert    - Colonoscopy 12/1989    - dT  4/06    - Pneumovax 3/04 and 05/2009     -Flu declined  October 17, 2008 and September 21, 2009      -CPX  May 31, 2009     - Meds reviewed with pt education and computerized med calendar adjusted  October 19, 2009      Vital Signs:  Patient profile:   70 year old male Weight:      203.25 pounds O2 Sat:      94 % on Room air Temp:     97.4 degrees F oral Pulse rate:   93 / minute BP sitting:   120 / 74  (left arm)  Vitals Entered By: Vernie Murders (January 26, 2010 11:03 AM)  O2 Flow:  Room air  Physical Exam  Additional Exam:  General: A/Ox3; pleasant and cooperative, NAD, casual affect  wt 204 September 21, 2009 >> 205 November 06, 2009  > 199, Jan 20- 204 > 203 January 26, 2010  SKIN: no rash, lesions NODES: no lymphadenopathy HEENT: [place]/AT, EOM- WNL, Conjuctivae- clear, PERRLA, TM-WNL, Nose- clear, Throat- clear and wnl NECK: Supple w/ fair ROM, JVD- none, normal carotid impulses w/o bruits Thyroid- CHEST: pan expiratory wheeze across low back and upper anterior chest. HEART: RRR, no m/g/r heard no edema ABDOMEN: Soft and nl; ZOX:WRUE, nl pulses, no clubbing, no calf tenderness   CXR  Procedure date:  01/26/2010  Findings:      hyperinflation, no infiltrates or effuisions  Impression & Recommendations:  Problem # 1:  ASTHMA (ICD-493.90)   DDX of  difficult airways managment all start with A and  include Adherence, Ace Inhibitors, Acid Reflux, Active Sinus Disease, Alpha 1 Antitripsin deficiency, Anxiety masquerading as Airways dz,  ABPA,  allergy(esp in young), Aspiration (esp in elderly), Adverse effects of DPI,  Active smokers, plus one B  = Beta blocker use..    Adherence and acid reflux biggest concerns here.    I spent extra time with the patient today explaining optimal mdi   technique.  This improved from  75-100% so try dulera 200 x 2 weeks then recheck count at ov.  See instructions for specific recommendations   Problem # 2:  CHEST PAIN (ICD-786.50) strongly suspect this is gerd/es spasm with non-adherence to ppi and diet   Each maintenance medication was reviewed in detail including most importantly the difference between maintenance and as needed and under what circumstances the prns are to be used. unfortunately not following med calendar so needs to come back in two weeks for a trust but verify ov  Medications Added to Medication List This Visit: 1)  Zegerid 40-1100 Mg Caps (Omeprazole-sodium bicarbonate) .... Take  one 30-60 min before first meal of the day and at bedtime 2)  Dulera 200-5 Mcg/act Aero (Mometasone furo-formoterol fum) .... 2 puffs first thing  in am and 2 puffs again in pm about 12 hours later 3)  Prednisone 10 Mg Tabs (Prednisone) .... 4 each am x 2 days,  3 x 2days, 2x2 days, and 1x2 days  Other Orders: T-2 View CXR (71020TC) Est. Patient Level IV (21308)  Patient Instructions: 1)  Dulera 200 2 puffs first thing  in am and 2 puffs again in pm about 12 hours later 2)  Increase Zegerid to Take  one 30-60 min before first meal of the day and at bedtime 3)  Prednisone 10 4 each am x 2 days,  3 x 2days, 2x2 days, and 1x2 days 4)  ok to use neb up to every 4 hours 5)  See Me  2 weeks with all your medications, even over the counter meds, separated in two separate bags, the ones you take no matter what vs the ones you stop once you feel better and take only as needed and bring your calendar with you Prescriptions: ZEGERID 40-1100 MG CAPS (OMEPRAZOLE-SODIUM BICARBONATE) Take  one 30-60 min before first meal of the day and at bedtime  #60 x 11   Entered and Authorized by:   Nyoka Cowden MD   Signed by:   Nyoka Cowden MD on 01/26/2010   Method used:   Electronically to        [facility] [phone]* (retail)       [address]       [place], [place]  [address]       Ph: [phone]       Fax: [phone]   RxID:   [id number] PREDNISONE 10 MG  TABS (PREDNISONE) 4 each am x 2 days,  3 x 2days, 2x2 days, and 1x2 days  #20 x 0   Entered and Authorized by:   Nyoka Cowden MD   Signed by:   Nyoka Cowden MD on 01/26/2010   Method used:   Electronically to        [facility] [phone]* (retail)       [address]       [place], [place]  [address]       Ph: [phone]       Fax: [phone]   RxID:   [id number]

## 2011-01-24 NOTE — Miscellaneous (Signed)
Summary: Injection Orders / [facility]    Injection Orders / [facility]    Imported By: Lennie Odor 05/31/2010 17:28:16  _____________________________________________________________________  External Attachment:    Type:   Image     Comment:   External Document

## 2011-01-24 NOTE — Assessment & Plan Note (Signed)
Summary: Acute NP office visit - fever   Copy to:  Dr. Romeo Apple Primary Provider/Referring Provider:  none  CC:  fever and headache x 3 days.  History of Present Illness: 69yobm never smoker  born prematurely with asthma since age 29s.   October 25, 2009- Allergic asthma Off antihistamines, returning at his request for allergy re-testing. Feels well. He always wheezes some and admits cooler weather makes this a little worse. Denies fever, purulent, chest pain or palpitation.  Skin Test- Strongly Pos grass, dust mite and endodermals.   January 11, 2010- Allergic asthma Doing ok building allergy vaccine here. No reactions or problems. Asthma is "as good as it gets" now. Somedays he is clear with no wheeze, then weather  changes and he may wheeze a little bit. It doesn't keep him awake or keep him from doing what he wants to do. Sometimes productive cough.   May 16, 2010- Allergic asthma, Rhinitis Returning for allergy f/u. Sees Dr Sherene Sires regularly for primary pulmonary. He has built allergy vaccine here to maintenance at 1:50 with no reactions. He says rhinitis and asthma have done well through the Spring pollen season. Needs refill Nasonex. Last used Xopenex rescue inhaler 2-3 days ago.  June 18, 2010 6 wk followup.  Pt states that overall his breathing has been doing well.  No complaints today. rare need for xopnenex hfa and never needs alb neb. rec no change in rx  July 31, 2010 ov Followup.  Pt c/o cough x 2 days- non prod.  Chest feels sore from cough. dulera count is accurate.  used neb once in last 48 hours better. no purulent sputum, ex or lateralizing cp. mild d dyspagia not consistent with ppi as per calendar.   prednisone x 6 days > better, no change in rcx   page 2  September 03, 2010 4 week followup, cough is better.  C/o new onset x 3 weeks almost daily  burning sensation left arm shoulder area and mid chest heart flluttering - last about 5 minutes,  tends to be more often  noted while sitting still,  no nausea, assoc with abd loating    Happening day in am while sitting.  >>?IBS >>Rec to stop prilosec and caltrate, begin pepcid two times a day , and add citrucel. CXR w/ no acute changes and cardiac panel unremarkable.   September 10, 2010--Returns for follow up and med review. Last visit w/ atypical chest pain w/ abd bloating and gas Improved w/ change in meds to pepcid and citrucel. Today he brought in a new Dulera inhaler . 1 week ago inhaler read 66 doses left. He says his inhaler stop working and he had to open new inhaler. New inhaler reads 112 doses left. We reviwed his  meds and organized his meds into a  med calendar. Denies chest pain, dyspnea, orthopnea, hemoptysis, fever, n/v/d, edema, headache.  Last couple of days has noticed w/ cold weather and rain that he has been wheezing. No discolored mucus. >>med calendar, rx steroids to have on hold.   09/13/10--Presents for an acute office visit. Was seen last 2 days ago. w/ mild flare of symptoms w/ wheezing and cough. Returns today with no improvement in symptoms. He has not started predniosne . Concerned b/c he started running fever  ~tmax 100. He feels achy, cough, congesiton-thick , and wheezing is getting worse. Denies chest pain,  orthopnea, hemoptysis,  n/v/d, edema, headache.   Preventive Screening-Counseling & Management  Alcohol-Tobacco  Smoking Status: never  Medications Prior to Update: 1)  Nasonex 50 Mcg/act  Susp (Mometasone Furoate) .Marland Kitchen.. 1 Puff in Each Notril Two Times A Day 2)  Dulera 200-5 Mcg/act Aero (Mometasone Furo-Formoterol Fum) .... 2 Puffs First Thing  in Am and 2 Puffs Again in Pm About 12 Hours Later 3)  Adult Aspirin Ec Low Strength 81 Mg  Tbec (Aspirin) .... Take 1 Tablet By Mouth Once A Day 4)  Pepcid 20 Mg Tabs (Famotidine) .Marland Kitchen.. 1 Tab By Mouth in The Morning and 1 At Bedtime 5)  Citrucel  Powd (Methylcellulose (Laxative)) .Marland Kitchen.. 1 Teaspoon in The Morning and 1 At Bedtime 6)   Xopenex Hfa 45 Mcg/act Aero (Levalbuterol Tartrate) .... 2 Puffs Every 4 Hours As Needed 7)  Albuterol Sulfate (2.5 Mg/27ml) 0.083%  Nebu (Albuterol Sulfate) .Marland Kitchen.. 1 Treatmen Every 4-6 Hours As Needed 8)  Xopenex 1.25 Mg/59ml Nebu (Levalbuterol Hcl) .... One Treatment Every 4-6 Hours As Needed 9)  Flutter Valve .... Use Every 4 Hours As Needed 10)  Mucus Relief 400 Mg  Tabs (Guaifenesin) .... 2-3 Every 12 Hours As Needed 11)  Aleve 220 Mg Tabs (Naproxen Sodium) .... Per Bottle As Needed For Joint Pain 12)  Gas-X Extra Strength 125 Mg Caps (Simethicone) .... Per Box As Needed Gas or Bloating 13)  Allergy Vaccine Building-Up 14)  Prednisone 10 Mg Tabs (Prednisone) .... 4 Each Am X 2 Days, 3 Each Am X 2 Days, 2 Each Am X 2 Days, 1 Each Am X 2 Days, Then Stop  Current Medications (verified): 1)  Nasonex 50 Mcg/act  Susp (Mometasone Furoate) .Marland Kitchen.. 1 Puff in Each Notril Two Times A Day 2)  Dulera 200-5 Mcg/act Aero (Mometasone Furo-Formoterol Fum) .... 2 Puffs First Thing  in Am and 2 Puffs Again in Pm About 12 Hours Later 3)  Adult Aspirin Ec Low Strength 81 Mg  Tbec (Aspirin) .... Take 1 Tablet By Mouth Once A Day 4)  Pepcid 20 Mg Tabs (Famotidine) .Marland Kitchen.. 1 Tab By Mouth in The Morning and 1 At Bedtime 5)  Citrucel  Powd (Methylcellulose (Laxative)) .Marland Kitchen.. 1 Teaspoon in The Morning and 1 At Bedtime 6)  Xopenex Hfa 45 Mcg/act Aero (Levalbuterol Tartrate) .... 2 Puffs Every 4 Hours As Needed 7)  Albuterol Sulfate (2.5 Mg/96ml) 0.083%  Nebu (Albuterol Sulfate) .Marland Kitchen.. 1 Treatmen Every 4-6 Hours As Needed 8)  Xopenex 1.25 Mg/78ml Nebu (Levalbuterol Hcl) .... One Treatment Every 4-6 Hours As Needed 9)  Flutter Valve .... Use Every 4 Hours As Needed 10)  Mucus Relief 400 Mg  Tabs (Guaifenesin) .... 2-3 Every 12 Hours As Needed 11)  Aleve 220 Mg Tabs (Naproxen Sodium) .... Per Bottle As Needed For Joint Pain 12)  Gas-X Extra Strength 125 Mg Caps (Simethicone) .... Per Box As Needed Gas or Bloating 13)  Allergy Vaccine  Building-Up 14)  Prednisone 10 Mg Tabs (Prednisone) .... 4 Each Am X 2 Days, 3 Each Am X 2 Days, 2 Each Am X 2 Days, 1 Each Am X 2 Days, Then Stop  Allergies (verified): No Known Drug Allergies  Past History:  Past Medical History: Last updated: 09/10/2010 ASTHMA (ICD-493.90)   --chronic severe asthma  with best FEV1 2.2 L documented 2/202 and       documented non-adherence  Mainly lives off samples of inhalers and nose sprays    - HFA  75% June 15, 2009 > 90% August 02, 2009 > 100% December 27, 2009     - Off  allergy vaccines x7/2010   > highly allergic skin tests per Dr Maple Hudson > restart vaccine November 06, 2009  SHOULDER PAIN, RIGHT (ICD-719.41) FREQUENCY, URINARY (ICD-788.41) HYPERTENSION, BORDERLINE (ICD-401.9) Shingles T 8 Left 9/01 HEALTH MAINTENANCE...................................................................................Marland KitchenWert    - Colonoscopy 12/1989    - dT  4/06    - Pneumovax 3/04 and 05/2009     -Flu declined  October 17, 2008 and September 21, 2009      -CPX   May 31, 2009     - Meds reviewed with pt education and computerized med calendar adjusted  October 19, 2009, February 20, 2010, September 10, 2010       Past Surgical History: Last updated: 01/11/2010 Tonsillectomy Umbilical hernia hemorrhoids  Review of Systems      See HPI  Vital Signs:  Patient profile:   70 year old male Height:      68 inches Weight:      205.13 pounds O2 Sat:      92 % on Room air Temp:     100.5 degrees F oral Pulse rate:   86 / minute BP sitting:   142 / 80  (left arm) Cuff size:   regular  Vitals Entered By: [name] CMA (September 13, 2010 3:46 PM)  O2 Sat at Rest %:  92 O2 Flow:  Room air CC: fever and headache x 3 days Is Patient Diabetic? No Pain Assessment Patient in pain? yes      Onset of pain  headache and fever x 3 days Comments no changes in meds today   Physical Exam  Additional Exam:   205 November 06, 2009  >   > 205 March 20, 2010 >  209 May 03, 2010 > 206 July 31, 2010 > 205 September 03, 2010 >>205 September 10, 2010 >>205 09/13/10 SKIN: no rash, lesions NODES: no lymphadenopathy HEENT: [place]/AT, EOM- WNL, Conjuctivae- clear, PERRLA, TM-WNL, Nose- narrow on right/ septal deviation, Throat- clear and wnl, no erythema or drainage  Mallampati  III NECK: Supple w/ fair ROM, JVD- none, normal carotid impulses w/o bruits Thyroid-, not hoarse CHEST: Prolonged expiratory phase, miild  exp wheeze HEART: RRR, no m/g/r heard no edema ABDOMEN: Soft and nl;  JXB:JYNW, nl pulses, no clubbing, no calf tenderness Skin warm and dry   Impression & Recommendations:  Problem # 1:  ASTHMA (ICD-493.90)  Flare w/ associated URI Plan:  Omnicef 300mg  two times a day for 7 days  Mucinex DM two times a day as needed cough/congeseiton Increase fluids.  and rest.  Tylenol as needed fever.  Prednisone taper over next week.  Please contact office for sooner follow up if symptoms do not improve or worsen   Orders: Nebulizer Tx (29562) Est. Patient Level IV (13086)  Medications Added to Medication List This Visit: 1)  Cefdinir 300 Mg Caps (Cefdinir) .Marland Kitchen.. 1 by mouth two times a day  Complete Medication List: 1)  Nasonex 50 Mcg/act Susp (Mometasone furoate) .Marland Kitchen.. 1 puff in each notril two times a day 2)  Dulera 200-5 Mcg/act Aero (Mometasone furo-formoterol fum) .... 2 puffs first thing  in am and 2 puffs again in pm about 12 hours later 3)  Adult Aspirin Ec Low Strength 81 Mg Tbec (Aspirin) .... Take 1 tablet by mouth once a day 4)  Pepcid 20 Mg Tabs (Famotidine) .Marland Kitchen.. 1 tab by mouth in the morning and 1 at bedtime 5)  Citrucel Powd (Methylcellulose (laxative)) .Marland Kitchen.. 1 teaspoon in the morning and  1 at bedtime 6)  Xopenex Hfa 45 Mcg/act Aero (Levalbuterol tartrate) .... 2 puffs every 4 hours as needed 7)  Albuterol Sulfate (2.5 Mg/26ml) 0.083% Nebu (Albuterol sulfate) .Marland Kitchen.. 1 treatmen every 4-6 hours as needed 8)  Xopenex 1.25 Mg/35ml Nebu  (Levalbuterol hcl) .... One treatment every 4-6 hours as needed 9)  Flutter Valve  .... Use every 4 hours as needed 10)  Mucus Relief 400 Mg Tabs (Guaifenesin) .... 2-3 every 12 hours as needed 11)  Aleve 220 Mg Tabs (Naproxen sodium) .... Per bottle as needed for joint pain 12)  Gas-x Extra Strength 125 Mg Caps (Simethicone) .... Per box as needed gas or bloating 13)  Allergy Vaccine Building-up  14)  Prednisone 10 Mg Tabs (Prednisone) .... 4 each am x 2 days, 3 each am x 2 days, 2 each am x 2 days, 1 each am x 2 days, then stop 15)  Cefdinir 300 Mg Caps (Cefdinir) .Marland Kitchen.. 1 by mouth two times a day  Patient Instructions: 1)  Omnicef 300mg  two times a day for 7 days  2)  Mucinex DM two times a day as needed cough/congeseiton 3)  Increase fluids.  and rest.  4)  Tylenol as needed fever.  5)  Prednisone taper over next week.  6)  Please contact office for sooner follow up if symptoms do not improve or worsen  Prescriptions: CEFDINIR 300 MG CAPS (CEFDINIR) 1 by mouth two times a day  #14 x 0   Entered and Authorized by:   Rubye Oaks NP   Signed by:   Rubye Oaks NP on 09/13/2010   Method used:   Electronically to        [facility] [phone]* (retail)       [address]       [address]       [place], [place]  [address]       Ph: [phone]       Fax: [phone]   RxID:   [id number]

## 2011-01-24 NOTE — Assessment & Plan Note (Signed)
Summary: Pulmonary/ f/u ov  > all goals of asthma met   Copy to:  Dr. Romeo Apple Primary Provider/Referring Provider:  none  CC:  Cough and wheezing- improving.  History of Present Illness: 69yobm never smoker  born prematurely with asthma since age 68s.   October 25, 2009- Allergic asthma Off antihistamines, returning at his request for allergy re-testing. Feels well. He always wheezes some and admits cooler weather makes this a little worse. Denies fever, purulent, chest pain or palpitation.  Skin Test- Strongly Pos grass, dust mite and endodermals.   January 11, 2010- Allergic asthma Doing ok building allergy vaccine here. No reactions or problems. Asthma is "as good as it gets" now. Somedays he is clear with no wheeze, then weather  changes and he may wheeze a little bit. It doesn't keep him awake or keep him from doing what he wants to do. Sometimes productive cough.   May 16, 2010- Allergic asthma, Rhinitis Returning for allergy f/u. Sees Dr Sherene Sires regularly for primary pulmonary. He has built allergy vaccine here to maintenance at 1:50 with no reactions. He says rhinitis and asthma have done well through the Spring pollen season. Needs refill Nasonex. Last used Xopenex rescue inhaler 2-3 days ago.  June 18, 2010 6 wk followup.  Pt states that overall his breathing has been doing well.  No complaints today. rare need for xopnenex hfa and never needs alb neb. rec no change in rx  July 31, 2010 ov Followup.  Pt c/o cough x 2 days- non prod.  Chest feels sore from cough. dulera count is accurate.  used neb once in last 48 hours better. no purulent sputum, ex or lateralizing cp. mild d dyspagia not consistent with ppi as per calendar.   prednisone x 6 days > better, no change in rcx   page 2  September 03, 2010 4 week followup, cough is better.  C/o new onset x 3 weeks almost daily  burning sensation left arm shoulder area and mid chest heart flluttering - last about 5 minutes,  tends  to be more often noted while sitting still,  no nausea, assoc with abd loating    Happening day in am while sitting.  >>?IBS >>Rec to stop prilosec and caltrate, begin pepcid two times a day , and add citrucel. CXR w/ no acute changes and cardiac panel unremarkable.   September 10, 2010--Returns for follow up and med review. Last visit w/ atypical chest pain w/ abd bloating and gas Improved w/ change in meds to pepcid and citrucel. Today he brought in a new Dulera inhaler . 1 week ago inhaler read 66 doses left. He says his inhaler stop working and he had to open new inhaler. New inhaler reads 112 doses left. We reviwed his  meds and organized his meds into a  med calendar. Denies chest pain, dyspnea, orthopnea, hemoptysis, fever, n/v/d, edema, headache.  Last couple of days has noticed w/ cold weather and rain that he has been wheezing. No discolored mucus. >>med calendar, rx steroids to have on hold.   09/13/10--Presents for an acute office visit. Was seen last 2 days ago. w/ mild flare of symptoms w/ wheezing and cough. Returns today with no improvement in symptoms. He has not started predniosne .  Omnicef 300mg  two times a day for 7 days  Mucinex DM two times a day as needed cough/congeseiton Increase fluids.  and rest.  Tylenol as needed fever.  Prednisone taper over next week  October 22, 2010 ov better, no more prednisone neededed, no more rescue rx or increase sob over baseline.  Pt denies any significant sore throat, dysphagia, itching, sneezing,  nasal congestion or excess secretions,  fever, chills, sweats, unintended wt loss, pleuritic or exertional cp, hempoptysis, change in activity tolerance  orthopnea pnd or leg swelling, any obvious fluctuation in symptoms with weather or environmental change or other alleviating or aggravating factors   Current Medications (verified): 1)  Nasonex 50 Mcg/act  Susp (Mometasone Furoate) .Marland Kitchen.. 1 Puff in Each Notril Two Times A Day 2)  Dulera 200-5 Mcg/act  Aero (Mometasone Furo-Formoterol Fum) .... 2 Puffs First Thing  in Am and 2 Puffs Again in Pm About 12 Hours Later 3)  Adult Aspirin Ec Low Strength 81 Mg  Tbec (Aspirin) .... Take 1 Tablet By Mouth Once A Day 4)  Pepcid 20 Mg Tabs (Famotidine) .Marland Kitchen.. 1 Tab By Mouth in The Morning and 1 At Bedtime 5)  Citrucel  Powd (Methylcellulose (Laxative)) .Marland Kitchen.. 1 Teaspoon in The Morning and 1 At Bedtime 6)  Xopenex Hfa 45 Mcg/act Aero (Levalbuterol Tartrate) .... 2 Puffs Every 4 Hours As Needed 7)  Albuterol Sulfate (2.5 Mg/29ml) 0.083%  Nebu (Albuterol Sulfate) .Marland Kitchen.. 1 Treatmen Every 4-6 Hours As Needed 8)  Xopenex 1.25 Mg/72ml Nebu (Levalbuterol Hcl) .... One Treatment Every 4-6 Hours As Needed 9)  Flutter Valve .... Use Every 4 Hours As Needed 10)  Mucus Relief 400 Mg  Tabs (Guaifenesin) .... 2-3 Every 12 Hours As Needed 11)  Aleve 220 Mg Tabs (Naproxen Sodium) .... Per Bottle As Needed For Joint Pain 12)  Gas-X Extra Strength 125 Mg Caps (Simethicone) .... Per Box As Needed Gas or Bloating 13)  Allergy Vaccine Building-Up 14)  Chloraseptic Sore Throat 1000 Mg/68ml Liqd (Acetaminophen) .... As Directed Per Bottle As Needed  Allergies (verified): No Known Drug Allergies  Past History:  Past Medical History: ASTHMA (ICD-493.90)   --chronic severe asthma  with best FEV1 2.2 L documented 2/202 and       documented non-adherence  Mainly lives off samples of inhalers and nose sprays    - HFA  75% June 15, 2009 > 90% August 02, 2009 > 100% December 27, 2009     - Off allergy vaccines x7/2010   > highly allergic skin tests per Dr Maple Hudson > restart vaccine November 06, 2009  SHOULDER PAIN, RIGHT (ICD-719.41) FREQUENCY, URINARY (ICD-788.41) HYPERTENSION, BORDERLINE (ICD-401.9) Shingles T 8 Left 9/01 HEALTH MAINTENANCE......................................................................................Marland KitchenWert    - Colonoscopy 12/1989    - dT  4/06    - Pneumovax 3/04 and 05/2009     -Flu declined  October 17, 2008  and September 21, 2009      -CPX   May 31, 2009     - Meds reviewed with pt education and computerized med calendar adjusted  October 19, 2009, February 20, 2010, September 10, 2010       Vital Signs:  Patient profile:   70 year old male Weight:      206.13 pounds O2 Sat:      94 % on Room air Temp:     97.9 degrees F oral Pulse rate:   90 / minute BP sitting:   126 / 76  (left arm)  Vitals Entered By: Vernie Murders (October 22, 2010 10:10 AM)  O2 Flow:  Room air  Physical Exam  Additional Exam:   205 November 06, 2009   > 206 July 31, 2010 >  205 September 10, 2010 >>205 09/13/10 > 206 October 22, 2010  SKIN: no rash, lesions NODES: no lymphadenopathy HEENT: [place]/AT, EOM- WNL, Conjuctivae- clear, PERRLA, TM-WNL, Nose- narrow on right/ septal deviation, Throat- clear and wnl, no erythema or drainage  Mallampati  III NECK: Supple w/ fair ROM, JVD- none, normal carotid impulses w/o bruits Thyroid-, not hoarse CHEST: Prolonged expiratory phase, miild  exp wheeze HEART: RRR, no m/g/r heard no edema ABDOMEN: Soft and nl;  ZOX:WRUE, nl pulses, no clubbing, no calf tenderness     Impression & Recommendations:  Problem # 1:  ASTHMA (ICD-493.90) All goals of asthma met including optimal function and elimination of symptoms with minimum need for rescue therapy. Contingencies discussed today including the rule of two's.   Each maintenance medication was reviewed in detail including most importantly the difference between maintenance and as needed and under what circumstances the prns are to be used. This was done in the context of a medication calendar review which provided the patient with a user-friendly unambiguous mechanism for medication administration and reconciliation and provides an action plan for all active problems. It is critical that this be shown to every doctor  for modification during the office visit if necessary so the patient can use it as a working document.    Medications Added to Medication List This Visit: 1)  Chloraseptic Sore Throat 1000 Mg/88ml Liqd (Acetaminophen) .... As directed per bottle as needed  Other Orders: Est. Patient Level III (45409)  Patient Instructions: 1)  See calendar for specific medication instructions and bring it back for each and every office visit for every healthcare provider you see.  Without it,  you may not receive the best quality medical care that we feel you deserve.  2)  Please schedule a follow-up appointment in 4 weeks, sooner if needed

## 2011-01-24 NOTE — Assessment & Plan Note (Signed)
Summary: Primary svc/ f/u asthma, new onset atypical cp    Copy to:  Dr. Romeo Apple Primary Provider/Referring Provider:  none  CC:  4 week followup and cough is better.  C/o burning sensation left arm shoulder area and mid chest heart flurry.  History of Present Illness:  69yobm never smoker  born prematurely with asthma since age 61s.   October 25, 2009- Allergic asthma Off antihistamines, returning at his request for allergy re-testing. Feels well. He always wheezes some and admits cooler weather makes this a little worse. Denies fever, purulent, chest pain or palpitation.  Skin Test- Strongly Pos grass, dust mite and endodermals.   January 11, 2010- Allergic asthma Doing ok building allergy vaccine here. No reactions or problems. Asthma is "as good as it gets" now. Somedays he is clear with no wheeze, then weather  changes and he may wheeze a little bit. It doesn't keep him awake or keep him from doing what he wants to do. Sometimes productive cough.   May 16, 2010- Allergic asthma, Rhinitis Returning for allergy f/u. Sees Dr Sherene Sires regularly for primary pulmonary. He has built allergy vaccine here to maintenance at 1:50 with no reactions. He says rhinitis and asthma have done well through the Spring pollen season. Needs refill Nasonex. Last used Xopenex rescue inhaler 2-3 days ago.  June 18, 2010 6 wk followup.  Pt states that overall his breathing has been doing well.  No complaints today. rare need for xopnenex hfa and never needs alb neb. rec no change in rx  July 31, 2010 ov Followup.  Pt c/o cough x 2 days- non prod.  Chest feels sore from cough. dulera count is accurate.  used neb once in last 48 hours better. no purulent sputum, ex or lateralizing cp. mild d dyspagia not consistent with ppi as per calendar.   prednisone x 6 days > better, no change in rcx   page 2  September 03, 2010 4 week followup, cough is better.  C/o new onset x 3 weeks almost daily  burning sensation  left arm shoulder area and mid chest heart flluttering - last about 5 minutes,  tends to be more often noted while sitting still,  no nausea, assoc with abd loating    Happening day in am while sitting. no assoc increase sob.  Pt denies any significant sore throat, dysphagia, itching, sneezing,  nasal congestion or excess secretions,  fever, chills, sweats, unintended wt loss, pleuritic or exertional cp, hempoptysis,    orthopnea pnd or leg swelling. Pt also denies any obvious fluctuation in symptoms with weather or environmental change or other alleviating or aggravating factors.     Pt denies any increase in rescue therapy over baseline, denies waking up needing it or having early am exacerbations of coughing/wheezing/ or dyspnea     Preventive Screening-Counseling & Management  Alcohol-Tobacco     Smoking Status: never  Current Medications (verified): 1)  Nasonex 50 Mcg/act  Susp (Mometasone Furoate) .Marland Kitchen.. 1 Puff in Each Notril Two Times A Day 2)  Dulera 200-5 Mcg/act Aero (Mometasone Furo-Formoterol Fum) .... 2 Puffs First Thing  in Am and 2 Puffs Again in Pm About 12 Hours Later 3)  Adult Aspirin Ec Low Strength 81 Mg  Tbec (Aspirin) .... Take 1 Tablet By Mouth Once A Day 4)  Zegerid 40-1100 Mg Caps (Omeprazole-Sodium Bicarbonate) .Marland Kitchen.. 1 Before 1st Meal of Day and At Bedtime 5)  Calcium Carbonate-Vitamin D 600-400 Mg-Unit  Tabs (Calcium Carbonate-Vitamin D) .Marland KitchenMarland KitchenMarland Kitchen  Take 1 Tablet By Mouth Two Times A Day 6)  Allergy Vaccine Building-Up 7)  Xopenex Hfa 45 Mcg/act Aero (Levalbuterol Tartrate) .Marland Kitchen.. 1-2 Puffs Every 4-6 Hours As Needed 8)  Albuterol Sulfate (2.5 Mg/9ml) 0.083%  Nebu (Albuterol Sulfate) .Marland Kitchen.. 1 Treatmen Every 4-6 Hours As Needed 9)  Xopenex 1.25 Mg/43ml Nebu (Levalbuterol Hcl) .... One Treatment Every 4-6 Hours As Needed 10)  Flutter Valve .... Use Every 4 Hours As Needed 11)  Mucus Relief 400 Mg  Tabs (Guaifenesin) .... 2-3 Every 12 Hours As Needed 12)  Aleve 220 Mg Tabs (Naproxen  Sodium) .... As Needed Arthritis Pain Per Bottle  Allergies (verified): No Known Drug Allergies  Past History:  Past Medical History: ASTHMA (ICD-493.90)   --chronic severe asthma  with best FEV1 2.2 L documented 2/202 and       documented non-adherence  Mainly lives off samples of inhalers and nose sprays    - HFA  75% June 15, 2009 > 90% August 02, 2009 > 100% December 27, 2009     - Off allergy vaccines x7/2010   > highly allergic skin tests per Dr Maple Hudson > restart vaccine November 06, 2009  SHOULDER PAIN, RIGHT (ICD-719.41) FREQUENCY, URINARY (ICD-788.41) HYPERTENSION, BORDERLINE (ICD-401.9) Shingles T 8 Left 9/01 HEALTH MAINTENANCE...................................................................................Marland KitchenWert    - Colonoscopy 12/1989    - dT  4/06    - Pneumovax 3/04 and 05/2009     -Flu declined  October 17, 2008 and September 21, 2009      -CPX   May 31, 2009     - Meds reviewed with pt education and computerized med calendar adjusted  October 19, 2009, February 20, 2010      Family History: Reviewed history from 05/31/2009 and no changes required. positive for asthma in sister positive for heart disease in mother brown lung in father lung cancer brother smoker  Social History: Reviewed history from 05/31/2009 and no changes required. Denies ever smoking No ETOH Retired  Vital Signs:  Patient profile:   70 year old male Height:      68 inches Weight:      205.50 pounds BMI:     31.36 O2 Sat:      93 % on Room air Temp:     98.2 degrees F oral Pulse rate:   69 / minute BP sitting:   114 / 70  (right arm) Cuff size:   regular  Vitals Entered By: Kandice Hams CMA (September 03, 2010 10:59 AM)  O2 Flow:  Room air CC: 4 week followup, cough is better.  C/o burning sensation left arm shoulder area and mid chest heart flurry   Physical Exam  Additional Exam:   205 November 06, 2009  >   > 205 March 20, 2010 > 209 May 03, 2010 > 206 July 31, 2010 > 205  September 03, 2010  SKIN: no rash, lesions NODES: no lymphadenopathy HEENT: [place]/AT, EOM- WNL, Conjuctivae- clear, PERRLA, TM-WNL, Nose- narrow on right/ septal deviation, Throat- clear and wnl, no erythema or drainage  Mallampati  III NECK: Supple w/ fair ROM, JVD- none, normal carotid impulses w/o bruits Thyroid-, not hoarse CHEST: Prolonged expiratory phase, minimal end exp wheeze HEART: RRR, no m/g/r heard no edema ABDOMEN: Soft and nl;  ZOX:WRUE, nl pulses, no clubbing, no calf tenderness Skin warm and dry     Creatine Kinase      [H]  247 U/L  7-232   Creatine Kinase Mb        3.3 ng/mL                   0.3-4.0   Relative Index            1.3 calc                    0.0-2.5  CXR  Procedure date:  09/04/2010  Findings:       Stable left lower lobe atelectasis or scar.  No evidence for edema  Impression & Recommendations:  Problem # 1:  CHEST PAIN (ICD-786.50) Non specific symptoms assoc with abd bloating ? ibs or effect of PPI, try change to h2 two times a day and citrucel.  Problem # 2:  ASTHMA (ICD-493.90) All goals of asthma met including optimal function and elimination of symptoms with minimum need for rescue therapy. Contingencies discussed today including the rule of two's.   His dulera count is off by about 10 but at least this is better than previous ov's  Having a great deal of difficulty maintaining med reconciliation.   To keep things simple, I have asked the patient to first separate medicines that are perceived as maintenance, that is to be taken daily "no matter what", from those medicines that are taken on only on an as-needed basis and I have given the patient examples of both, and then return to see our NP to generate a  detailed  medication calendar which should be followed until the next physician sees the patient and updates it.    Once we're sure that we're all reading from the same page in terms of medication admiistration, she needs to be  scheduled to follow up with me with pft's  Medications Added to Medication List This Visit: 1)  Zegerid 40-1100 Mg Caps (Omeprazole-sodium bicarbonate) .Marland Kitchen.. 1 before 1st meal of day and at bedtime  Other Orders: Est. Patient Level IV (08657) TLB-Cardiac Panel ([id number]) T-2 View CXR (71020TC)  Patient Instructions: 1)  stop the caltrate and prilosec 2)  Citrucel one tsp twice daily with glass of water 3)  Start pepcid ac 20 mg after bfast and at bedtime 4)  See [name] w/in [place] with all your medications, even over the counter meds, separated in two separate bags, the ones you take no matter what vs the ones you stop once you feel better and take only as needed.  She will generate for you a new user friendly medication calendar that will put [place] all on the same page re: your medication use.  5)  LATE ADD  Dulera count is 66

## 2011-01-24 NOTE — Progress Notes (Signed)
Summary: nos appt  Phone Note Call from Patient   Caller: [email or web address]  Call For: Ailene Royal Summary of Call: LMTCB x2 to rsc nos from 5/20. Initial call taken by: Darletta Moll,  May 14, 2010 2:47 PM

## 2011-01-24 NOTE — Assessment & Plan Note (Signed)
Summary: Primary svc/ f/u asthma   Copy to:  Dr. Romeo Apple Primary Provider/Referring Provider:  none  CC:  6 wk followup.  Pt states that overall his breathing has been doing well.  No complaints today.[name]  History of Present Illness: October 19, 2009--Returns for follow up and no complaints. Symbicort does not add up, says he had 2 samples, only 9 doses used out of current symbicort inhaler. pt ed given , med calnedar adjusted. No flare since last visit. Denies chest pain, dyspnea, orthopnea, hemoptysis, fever, n/v/d, edema, headache.   October 25, 2009- Allergic asthma Off antihistamines, returning at his request for allergy re-testing. Feels well. He always wheezes some and admits cooler weather makes this a little worse. Denies fever, purulent, chest pain or palpitation.  Skin Test- Strongly Pos grass, dust mite and endodermals.   January 11, 2010- Allergic asthma Doing ok building allergy vaccine here. No reactions or problems. Asthma is "as good as it gets" now. Somedays he is clear with no wheeze, then weather  changes and he may wheeze a little bit. It doesn't keep him awake or keep him from doing what he wants to do. Sometimes productive cough.   May 16, 2010- Allergic asthma, Rhinitis Returning for allergy f/u. Sees Dr Sherene Sires regularly for primary pulmonary. He has built allergy vaccine here to maintenance at 1:50 with no reactions. He says rhinitis and asthma have done well through the Spring pollen season. Needs refill Nasonex. Last used Xopenex rescue inhaler 2-3 days ago.  June 18, 2010 6 wk followup.  Pt states that overall his breathing has been doing well.  No complaints today. rare need for xopnenex hfa and never needs alb neb.  Pt denies any significant sore throat, dysphagia, itching, sneezing,  nasal congestion or excess secretions,  fever, chills, sweats, unintended wt loss, pleuritic or exertional cp, hempoptysis, change in activity tolerance  orthopnea pnd or leg swelling.  Pt also denies any obvious fluctuation in symptoms with weather or environmental change or other alleviating or aggravating factors.         Current Medications (verified): 1)  Nasonex 50 Mcg/act  Susp (Mometasone Furoate) .[name].. 1 Puff in Each Notril Two Times A Day 2)  Dulera 200-5 Mcg/act Aero (Mometasone Furo-Formoterol Fum) .... 2 Puffs First Thing  in Am and 2 Puffs Again in Pm About 12 Hours Later 3)  Adult Aspirin Ec Low Strength 81 Mg  Tbec (Aspirin) .... Take 1 Tablet By Mouth Once A Day 4)  Zegerid 40-1100 Mg Caps (Omeprazole-Sodium Bicarbonate) .... Take  One 30-60 Min Before First Meal of The Day and At Bedtime 5)  Calcium Carbonate-Vitamin D 600-400 Mg-Unit  Tabs (Calcium Carbonate-Vitamin D) .... Take 1 Tablet By Mouth Two Times A Day 6)  Allergy Vaccine Building-Up 7)  Xopenex Hfa 45 Mcg/act Aero (Levalbuterol Tartrate) .[name].. 1-2 Puffs Every 4-6 Hours As Needed 8)  Albuterol Sulfate (2.5 Mg/11ml) 0.083%  Nebu (Albuterol Sulfate) .[name].. 1 Treatmen Every 4-6 Hours As Needed 9)  Xopenex 1.25 Mg/13ml Nebu (Levalbuterol Hcl) .... One Treatment Every 4-6 Hours As Needed 10)  Flutter Valve .... Use Every 4 Hours As Needed 11)  Mucus Relief 400 Mg  Tabs (Guaifenesin) .... 2-3 Every 12 Hours As Needed 12)  Aleve 220 Mg Tabs (Naproxen Sodium) .... As Needed Arthritis Pain Per Bottle  Allergies (verified): No Known Drug Allergies  Past History:  Past Medical History: ASTHMA (ICD-493.90)   --chronic severe asthma  with best FEV1 2.2 L documented 2/02  and       documented non-adherence  Mainly lives off samples of inhalers and nose sprays    - HFA  75% June 15, 2009 > 90% August 02, 2009 > 100% December 27, 2009     - Off allergy vaccines x7/2010   > highly allergic skin tests per Dr Maple Hudson > restart vaccine November 06, 2009  SHOULDER PAIN, RIGHT (ICD-719.41) FREQUENCY, URINARY (ICD-788.41) HYPERTENSION, BORDERLINE (ICD-401.9) Shingles T 8 Left 9/01 HEALTH  MAINTENANCE...............................................................................Hector KitchenWert    - Colonoscopy 12/1989    - dT  4/06    - Pneumovax 3/04 and 05/2009     -Flu declined  October 17, 2008 and September 21, 2009      -CPX  May 31, 2009     - Meds reviewed with pt education and computerized med calendar adjusted  October 19, 2009, February 20, 2010      Vital Signs:  Patient profile:   70 year old male Weight:      203 pounds O2 Sat:      95 % on Room air Temp:     97.5 degrees F oral Pulse rate:   74 / minute BP sitting:   122 / 78  (left arm)  Vitals Entered By: Vernie Murders (June 18, 2010 9:58 AM)  O2 Flow:  Room air  Physical Exam  Additional Exam:  General: A/Ox3; pleasant and cooperative, calm/ reserved  205 November 06, 2009  >  206 February 06, 2010 >> 202 February 20, 2010 > 205 March 20, 2010 > 209 May 03, 2010  SKIN: no rash, lesions NODES: no lymphadenopathy HEENT: [place]/AT, EOM- WNL, Conjuctivae- clear, PERRLA, TM-WNL, Nose- narrow on right/ septal deviation, Throat- clear and wnl, no erythema or drainage  Mallampati  III NECK: Supple w/ fair ROM, JVD- none, normal carotid impulses w/o bruits Thyroid-, not hoarse CHEST: Prolonged expiratory phase, minimal end exp wheeze HEART: RRR, no m/g/r heard no edema ABDOMEN: Soft and nl;  ZOX:WRUE, nl pulses, no clubbing, no calf tenderness   Impression & Recommendations:  Problem # 1:  ASTHMA (ICD-493.90) DDX of  difficult airways managment all start with A and  include Adherence, Ace Inhibitors, Acid Reflux, Active Sinus Disease, Alpha 1 Antitripsin deficiency, Anxiety masquerading as Airways dz,  ABPA,  allergy(esp in young), Aspiration (esp in elderly), Adverse effects of DPI,  Active smokers, plus one B  = Beta blocker use..    Adherence continues to be the biggest hurdle here, provided samples of dulera with counter  Allergies also a concern,  on immunotherapy per Dr. Maple Hudson.   Each maintenance medication was  reviewed in detail including most importantly the difference between maintenance and as needed and under what circumstances the prns are to be used. This was done in the context of a medication calendar review which provided the patient with a user-friendly unambiguous mechanism for medication administration and reconciliation and provides an action plan for all active problems. It is critical that this be shown to every doctor  for modification during the office visit if necessary so the patient can use it as a working document.   Problem # 2:  CHRONIC RHINITIS (ICD-472.0) I emphasized that nasal steroids have no immediate benefit in terms of improving symptoms.  To help them reached the target tissue, the patient should use Afrin two puffs every 12 hours applied one min before using the nasal steroids.  Afrin should be stopped after no more than 5 days.  If the symptoms worsen, Afrin can be restarted after 5 days off of therapy to prevent rebound congestion from overuse of Afrin.  I also emphasized that in no way are nasal steroids a concern in terms of "addiction".    Other Orders: Est. Patient Level III (16109)  Patient Instructions: 1)  See calendar for specific medication instructions and bring it back for each and every office visit for every healthcare provider you see.  Without it,  you may not receive the best quality medical care that we feel you deserve.  2)  Dulera count is 14 and you were given a month's sample

## 2011-01-24 NOTE — Miscellaneous (Signed)
Summary: Injection record  Injection record   Imported By: Lester Cheney 01/04/2011 11:49:20  _____________________________________________________________________  External Attachment:    Type:   Image     Comment:   External Document

## 2011-01-24 NOTE — Progress Notes (Signed)
Summary: ALLERGY  Phone Note Other Incoming   Caller: Dr. Maple Hudson gave [place] a 1:10 rx. Details for Reason: [name] is on build up. Summary of Call: [name] is currently building up on 1:500. Once he builds up on 1:50 and tolerates it okay do you want [place] to hang on to the rx and advance him to 1:10 then? Please advise. [name] checked your schedule yesterday & he wasn't on there then. That's why you didn't get a copy of his card. Initial call taken by: Dimas Millin,  May 16, 2010 2:45 PM  Follow-up for Phone Call        No- Please cancel and discard script oredering 1:10.  He told me today that he was getting shots weekly, and without the paper, I assumed wrongly that he was nw on 1:50.  Please continue standard build-up or let me know of any concerns.  Additional Follow-up for Phone Call Additional follow up Details #1::        Done. We will let you know if there are any problems or concerns. Tammy Scott  May 17, 2010 9:13 AM     New/Updated Medications: * ALLERGY VACCINE BUILDING-UP

## 2011-01-24 NOTE — Assessment & Plan Note (Signed)
Summary: NP follow up - med calendar   Copy to:  Dr. Romeo Apple Primary Provider/Referring Provider:  none  CC:  est med calendar - pt brought all meds today.  states cough and wheezing from last OV have resolved.  History of Present Illness: A 67 yobm never smoker with chronic severe asthma  with best FEV1 2.2 L  2/02 and documented non-adherence  Mainly lives off samples of inhalers and nose sprays- "cant afford"  September 21, 2009 ov worse sob/chest tightness off meds again, tells me now also off shots for months now. --refer to allergy    October 25, 2009- Allergic asthma Off antihistamines, returning at his request for allergy re-testing. Feels well. He always wheezes some and admits cooler weather makes this a little worse. Denies fever, purulent, chest pain or palpitation.  Skin Test- Strongly Pos grass, dust mite and endodermals.   January 11, 2010- Allergic asthma Doing ok building allergy vaccine here. No reactions or problems. Asthma is "as good as it gets" now. Somedays he is clear with no wheeze, then weather  changes and he may wheeze a little bit. It doesn't keep him awake or keep him from doing what he wants to do. Sometimes productive cough.   January 26, 2010 Acute visit.  Pt c/o wheezing x 2 days.  He also c/o soreness across chest and back worse with cough, has had similar problems  in past and also has assoc mild dysphagia.   Also has prod cough with cleatr sputum.   February 06, 2010 Acute visit.  Pt c/o prod cough with yellow sputum x 1 day.  He also c/o wheezing since 2/12 with increased need for neb and new left flank pain and polyuria but no dysuria.    February 20, 2010 --Returns for follow up and med review. Last visit w/ exacerbation , changed from symbicort to dulera. Tx w/ abx/steroid taper. He is feeling better and back to baseline.  Denies chest pain, dyspnea, orthopnea, hemoptysis, fever, n/v/d, edema, headache.    Medications Prior to Update: 1)  Nasonex  50 Mcg/act  Susp (Mometasone Furoate) .Marland Kitchen.. 1 Puff in Each Notril Two Times A Day 2)  Adult Aspirin Ec Low Strength 81 Mg  Tbec (Aspirin) .... Take 1 Tablet By Mouth Once A Day 3)  Vitamin D 1000 Unit Tabs (Cholecalciferol) .... Take 1 Tablet By Mouth Once A Day 4)  Zegerid 40-1100 Mg Caps (Omeprazole-Sodium Bicarbonate) .... Take  One 30-60 Min Before First Meal of The Day and At Bedtime 5)  Ventolin Hfa 108 (90 Base) Mcg/act Aers (Albuterol Sulfate) .Marland Kitchen.. 1-2 Puffs Every 4-6 Hours As Needed 6)  Albuterol Sulfate (2.5 Mg/8ml) 0.083%  Nebu (Albuterol Sulfate) .... Use Every 4-6 Hours As Needed 7)  Xopenex 1.25 Mg/30ml Nebu (Levalbuterol Hcl) .... One Treatment Every 4-6 Hours As Needed 8)  Flutter Valve .... Use Every 4 Hours As Needed 9)  Mucus Relief 400 Mg  Tabs (Guaifenesin) .... 2-3 Every 12 Hours As Needed 10)  Aleve 220 Mg Tabs (Naproxen Sodium) .... As Needed Arthritis Pain Per Bottle 11)  Allergy Vaccine New Start Here 12)  Dulera 200-5 Mcg/act Aero (Mometasone Furo-Formoterol Fum) .... 2 Puffs First Thing  in Am and 2 Puffs Again in Pm About 12 Hours Later 13)  Prednisone 10 Mg  Tabs (Prednisone) .... 4 Each Am X 2 Days,  3 X 2days, 2x2 Days, and 1x2 Days  Allergies (verified): No Known Drug Allergies  Past History:  Past Surgical History: Last updated: 01/11/2010 Tonsillectomy Umbilical hernia hemorrhoids  Family History: Last updated: 05/31/2009 positive for asthma in sister positive for heart disease in mother brown lung in father lung cancer brother smoker  Social History: Last updated: 05/31/2009 Denies ever smoking No ETOH Retired  Risk Factors: Smoking Status: never (01/04/2008)  Past Medical History: ASTHMA (ICD-493.90)   --chronic severe asthma  with best FEV1 2.2 L documented 2/02 and       documented non-adherence  Mainly lives off samples of inhalers and nose sprays    - HFA  75% June 15, 2009 > 90% August 02, 2009 > 100% December 27, 2009     - Off  allergy vaccines x7/2010   > highly allergic skin tests per Dr Maple Hudson > restart vaccine November 06, 2009  SHOULDER PAIN, RIGHT (ICD-719.41) FREQUENCY, URINARY (ICD-788.41) HYPERTENSION, BORDERLINE (ICD-401.9) Shingles T 8 Left 9/01 HEALTH MAINTENANCE.........................................................................Marland KitchenWert    - Colonoscopy 12/1989    - dT  4/06    - Pneumovax 3/04 and 05/2009     -Flu declined  October 17, 2008 and September 21, 2009      -CPX  May 31, 2009     - Meds reviewed with pt education and computerized med calendar adjusted  October 19, 2009, February 20, 2010      Review of Systems      See HPI  Vital Signs:  Patient profile:   70 year old male Height:      68 inches Weight:      202 pounds BMI:     30.83 O2 Sat:      94 % on Room air Temp:     96.8 degrees F oral Pulse rate:   74 / minute BP sitting:   124 / 76  (left arm) Cuff size:   regular  Vitals Entered By: Boone Master CNA (February 20, 2010 9:08 AM)  O2 Flow:  Room air CC: est med calendar - pt brought all meds today.  states cough and wheezing from last OV have resolved Is Patient Diabetic? No Comments Medications reviewed with patient Daytime contact number verified with patient. Boone Master CNA  February 20, 2010 9:08 AM    Physical Exam  Additional Exam:  General: A/Ox3; pleasant and cooperative, a bit more anxious than baseline, still somewhat childlike affect  205 November 06, 2009  > 199, Jan 20- 204 > 203 January 26, 2010 > 206 February 06, 2010 >>202 February 20, 2010  SKIN: no rash, lesions NODES: no lymphadenopathy HEENT: [place]/AT, EOM- WNL, Conjuctivae- clear, PERRLA, TM-WNL, Nose- clear, Throat- clear and wnl NECK: Supple w/ fair ROM, JVD- none, normal carotid impulses w/o bruits Thyroid- CHEST: CTA no wheezing  HEART: RRR, no m/g/r heard no edema ABDOMEN: Soft and nl; neg cvat ZOX:WRUE, nl pulses, no clubbing, no calf tenderness   Impression & Recommendations:  Problem  # 1:  ASTHMA (ICD-493.90) Exacerbation- now resolved Meds reviewed with pt education and computerized med calendar completed/adjusted.   1 sample of dulera and nasonex given.   Complete Medication List: 1)  Nasonex 50 Mcg/act Susp (Mometasone furoate) .Marland Kitchen.. 1 puff in each notril two times a day 2)  Adult Aspirin Ec Low Strength 81 Mg Tbec (Aspirin) .... Take 1 tablet by mouth once a day 3)  Vitamin D 1000 Unit Tabs (Cholecalciferol) .... Take 1 tablet by mouth once a day 4)  Zegerid 40-1100 Mg Caps (Omeprazole-sodium bicarbonate) .... Take  one 30-60 min before first meal of the day and at bedtime 5)  Ventolin Hfa 108 (90 Base) Mcg/act Aers (Albuterol sulfate) .Marland Kitchen.. 1-2 puffs every 4-6 hours as needed 6)  Albuterol Sulfate (2.5 Mg/49ml) 0.083% Nebu (Albuterol sulfate) .... Use every 4-6 hours as needed 7)  Xopenex 1.25 Mg/7ml Nebu (Levalbuterol hcl) .... One treatment every 4-6 hours as needed 8)  Flutter Valve  .... Use every 4 hours as needed 9)  Mucus Relief 400 Mg Tabs (Guaifenesin) .... 2-3 every 12 hours as needed 10)  Aleve 220 Mg Tabs (Naproxen sodium) .... As needed arthritis pain per bottle 11)  Allergy Vaccine New Start Here  12)  Dulera 200-5 Mcg/act Aero (Mometasone furo-formoterol fum) .... 2 puffs first thing  in am and 2 puffs again in pm about 12 hours later 13)  Prednisone 10 Mg Tabs (Prednisone) .... 4 each am x 2 days,  3 x 2days, 2x2 days, and 1x2 days  Other Orders: Est. Patient Level III (63016)  Patient Instructions: 1)  Continue on same meds.  2)  Follow med calendar closely and bring to each visit.  3)  follow up Dr. Sherene Sires in 4-6 weeks.  4)  Please contact office for sooner follow up if symptoms do not improve or worsen   Appended Document: med calendar update    Clinical Lists Changes  Medications: Changed medication from NASONEX 50 MCG/ACT  SUSP (MOMETASONE FUROATE) 1 puff in each notril two times a day to NASONEX 50 MCG/ACT  SUSP (MOMETASONE FUROATE) 1  puff in each notril two times a day - Signed Added new medication of CALCIUM CARBONATE-VITAMIN D 600-400 MG-UNIT  TABS (CALCIUM CARBONATE-VITAMIN D) Take 1 tablet by mouth two times a day - Signed Changed medication from VENTOLIN HFA 108 (90 BASE) MCG/ACT AERS (ALBUTEROL SULFATE) 1-2 puffs every 4-6 hours as needed to XOPENEX HFA 45 MCG/ACT AERO (LEVALBUTEROL TARTRATE) 1-2 puffs every 4-6 hours as needed - Signed Changed medication from ALBUTEROL SULFATE (2.5 MG/3ML) 0.083%  NEBU (ALBUTEROL SULFATE) use every 4-6 hours as needed to ALBUTEROL SULFATE (2.5 MG/3ML) 0.083%  NEBU (ALBUTEROL SULFATE) 1 treatmen every 4-6 hours as needed - Signed Removed medication of PREDNISONE 10 MG  TABS (PREDNISONE) 4 each am x 2 days,  3 x 2days, 2x2 days, and 1x2 days - Signed

## 2011-01-24 NOTE — Letter (Signed)
Summary: New Patient letter  [facility]  [address] [place], [place] [address]   Phone: [phone]  Fax: [phone]       12/26/2010 MRN: [id number]  [name] [address] [place], [place]  [address]  Dear [name],  Welcome to the Gastroenterology Division at [facility].    You are scheduled to see Dr.  Jarold Motto on 01-31-11 at 9:30a.m. on the 3rd floor at [facility], [address]  We ask that you try to arrive at our office 15 minutes prior to your appointment time to allow for check-in.  We would like you to complete the enclosed self-administered evaluation form prior to your visit and bring it with you on the day of your appointment.  We will review it with you.  Also, please bring a complete list of all your medications or, if you prefer, bring the medication bottles and we will list them.  Please bring your insurance card so that we may make a copy of it.  If your insurance requires a referral to see a specialist, please bring your referral form from your primary care physician.  Co-payments are due at the time of your visit and may be paid by cash, check or credit card.     Your office visit will consist of a consult with your physician (includes a physical exam), any laboratory testing he/she may order, scheduling of any necessary diagnostic testing (e.g. x-ray, ultrasound, CT-scan), and scheduling of a procedure (e.g. Endoscopy, Colonoscopy) if required.  Please allow enough time on your schedule to allow for any/all of these possibilities.    If you cannot keep your appointment, please call [phone] to cancel or reschedule prior to your appointment date.  This allows [place] the opportunity to schedule an appointment for another patient in need of care.  If you do not cancel or reschedule by 5 p.m. the business day prior to your appointment date, you will be charged a $50.00 late cancellation/no-show fee.    Thank you for choosing  [facility] for your medical needs.  We appreciate the opportunity to care for you.  Please visit [place] at our website  to learn more about our practice.                     Sincerely,                                                             The Gastroenterology Division

## 2011-01-24 NOTE — Assessment & Plan Note (Signed)
Summary: Pulmonary/ acute ext ov    Copy to:  Dr. Romeo Apple Primary Provider/Referring Provider:  none  CC:  Acute visit.  Pt c/o prod cough with yellow sputum x 1 day.  He also c/o wheezing today.Marland Kitchen  History of Present Illness: September 21, 2009 ov worse sob/chest tightness off meds again, tells me now also off shots for months now. --refer to allergy   October 02, 2009- Allergic asthma He decided he wanted to restart shots bcause he has been more congested since being off.  --to return for allergy testing.   October 19, 2009--Returns for follow up and no complaints. Symbicort does not add up, says he had 2 samples, only 9 doses used out of current symbicort inhaler. pt ed given , med calnedar adjusted. No flare since last visit. Denies chest pain, dyspnea, orthopnea, hemoptysis, fever, n/v/d, edema, headache.   October 25, 2009- Allergic asthma Off antihistamines, returning at his request for allergy re-testing. Feels well. He always wheezes some and admits cooler weather makes this a little worse. Denies fever, purulent, chest pain or palpitation.  Skin Test- Strongly Pos grass, dust mite and endodermals.   January 11, 2010- Allergic asthma Doing ok building allergy vaccine here. No reactions or problems. Asthma is "as good as it gets" now. Somedays he is clear with no wheeze, then weather  changes and he may wheeze a little bit. It doesn't keep him awake or keep him from doing what he wants to do. Sometimes productive cough.   January 26, 2010 Acute visit.  Pt c/o wheezing x 2 days.  He also c/o soreness across chest and back worse with cough, has had similar problems  in past and also has assoc mild dysphagia.   Also has prod cough with cleatr sputum.   February 06, 2010 Acute visit.  Pt c/o prod cough with yellow sputum x 1 day.  He also c/o wheezing since 2/12 with increased need for neb and new left flank pain and polyuria but no dysuria. Pt denies any significant sore throat,  dysphagia, itching, sneezing,  nasal congestion or excess secretions,  fever, chills, sweats, unintended wt loss,  exertional cp, hempoptysis, change in activity tolerance  orthopnea pnd or leg swelling or hemoptysis.    Current Medications (verified): 1)  Nasonex 50 Mcg/act  Susp (Mometasone Furoate) .Marland Kitchen.. 1 Puff in Each Notril Two Times A Day 2)  Adult Aspirin Ec Low Strength 81 Mg  Tbec (Aspirin) .... Take 1 Tablet By Mouth Once A Day 3)  Vitamin D 1000 Unit Tabs (Cholecalciferol) .... Take 1 Tablet By Mouth Once A Day 4)  Zegerid 40-1100 Mg Caps (Omeprazole-Sodium Bicarbonate) .... Take  One 30-60 Min Before First Meal of The Day and At Bedtime 5)  Ventolin Hfa 108 (90 Base) Mcg/act Aers (Albuterol Sulfate) .Marland Kitchen.. 1-2 Puffs Every 4-6 Hours As Needed 6)  Albuterol Sulfate (2.5 Mg/78ml) 0.083%  Nebu (Albuterol Sulfate) .... Use Every 4-6 Hours As Needed 7)  Xopenex 1.25 Mg/60ml Nebu (Levalbuterol Hcl) .... One Treatment Every 4-6 Hours As Needed 8)  Flutter Valve .... Use Every 4 Hours As Needed 9)  Mucus Relief 400 Mg  Tabs (Guaifenesin) .... 2-3 Every 12 Hours As Needed 10)  Aleve 220 Mg Tabs (Naproxen Sodium) .... As Needed Arthritis Pain Per Bottle 11)  Allergy Vaccine New Start Here 12)  Dulera 200-5 Mcg/act Aero (Mometasone Furo-Formoterol Fum) .... 2 Puffs First Thing  in Am and 2 Puffs Again in Pm About 12 Hours  Later  Allergies (verified): No Known Drug Allergies  Past History:  Past Medical History: ASTHMA (ICD-493.90)   --chronic severe asthma  with best FEV1 2.2 L documented 2/02 and       documented non-adherence  Mainly lives off samples of inhalers and nose sprays    - HFA  75% June 15, 2009 > 90% August 02, 2009 > 100% December 27, 2009     - Off allergy vaccines x7/2010   > highly allergic skin tests per Dr Maple Hudson > restart vaccine November 06, 2009  SHOULDER PAIN, RIGHT (ICD-719.41) FREQUENCY, URINARY (ICD-788.41) HYPERTENSION, BORDERLINE (ICD-401.9) Shingles T 8 Left  9/01 HEALTH MAINTENANCE.........................................................................Marland KitchenWert    - Colonoscopy 12/1989    - dT  4/06    - Pneumovax 3/04 and 05/2009     -Flu declined  October 17, 2008 and September 21, 2009      -CPX  May 31, 2009     - Meds reviewed with pt education and computerized med calendar adjusted  October 19, 2009      Vital Signs:  Patient profile:   70 year old male Weight:      206 pounds O2 Sat:      93 % on Room air Temp:     98.3 degrees F oral Pulse rate:   90 / minute BP sitting:   152 / 70  (left arm)  Vitals Entered By: Vernie Murders (February 06, 2010 10:41 AM)  O2 Flow:  Room air  Physical Exam  Additional Exam:  General: A/Ox3; pleasant and cooperative, a bit more anxious than baseline, still somewhat childlike affect wt 204 September 21, 2009 >> 205 November 06, 2009  > 199, Jan 20- 204 > 203 January 26, 2010 > 206 February 06, 2010  SKIN: no rash, lesions NODES: no lymphadenopathy HEENT: [place]/AT, EOM- WNL, Conjuctivae- clear, PERRLA, TM-WNL, Nose- clear, Throat- clear and wnl NECK: Supple w/ fair ROM, JVD- none, normal carotid impulses w/o bruits Thyroid- CHEST: pan expiratory wheeze across low back and upper anterior chest. HEART: RRR, no m/g/r heard no edema ABDOMEN: Soft and nl; neg cvat ZOX:WRUE, nl pulses, no clubbing, no calf tenderness   Color                     LT. YELLOW       RANGE:  Yellow;Lt. Yellow   Clarity                   CLEAR                       Clear   Specific Gravity          <=1.005                     1.000 - 1.030   Urine Ph                  6.5                         5.0-8.0   Protein                   NEGATIVE                    Negative   Urine Glucose             NEGATIVE  Negative   Ketones                   NEGATIVE                    Negative   Urine Bilirubin           NEGATIVE                    Negative   Blood                     TRACE-INTACT                Negative    Urobilinogen              0.2                         0.0 - 1.0   Leukocyte Esterace        NEGATIVE                    Negative   Nitrite                   NEGATIVE                    Negative  CXR  Procedure date:  02/06/2010  Findings:       Comparison: Chest x-ray of 01/26/2010   Findings: No active infiltrate or effusion is seen.  There is peribronchial thickening which may indicate bronchitis.  The heart is within the upper limits of normal in size.  No bony abnormality is noted.   IMPRESSION: No pneumonia.  Peribronchial thickening may indicate bronchitis.  Impression & Recommendations:  Problem # 1:  ASTHMA (ICD-493.90) Acute exac in setting of uri and recent taper off systemic steroids despite ok hfa technique documented today and good correlation between ov and # of doses of dulera remaining in inhlaer.   Each maintenance medication was reviewed in detail including most importantly the difference between maintenance and as needed and under what circumstances the prns are to be used. See instructions for specific recommendations   Problem # 2:  CHEST PAIN (ICD-786.50)  cxr looks like old rib rx's, u/a neg for hematuria, no evidence pulmonary emboli as  cp has recurred in same area as previous cp. rx with aleve as needed.  Problem # 3:  FREQUENCY, URINARY (ICD-788.41)  u/a unremarkable though SG a bit low and pt drinking lots of water, rec use sugarless candy for mouth dryness.  Medications Added to Medication List This Visit: 1)  Prednisone 10 Mg Tabs (Prednisone) .... 4 each am x 2 days,  3 x 2days, 2x2 days, and 1x2 days  Other Orders: Nebulizer Tx (21308) TLB-Udip ONLY (81003-UDIP) T-2 View CXR (71020TC) Est. Patient Level IV (65784)  Patient Instructions: 1)  Prednisone 4 each am x 2 days,  3 x 2days, 2x2 days, and 1x2 days  2)  Doxycycline 100 mg twice daily x 7days 3)  See [name] w/in 2 weeks with all your medications, even over the counter meds,  separated in two separate bags, the ones you take no matter what vs the ones you stop once you feel better and take only as needed.  She will generate for you a new user friendly medication calendar that will put [place] all on the same page re: your medication use.  Prescriptions: PREDNISONE 10 MG  TABS (PREDNISONE) 4 each am x 2 days,  3 x 2days, 2x2 days, and 1x2 days  #20 x 0   Entered and Authorized by:   Nyoka Cowden MD   Signed by:   Nyoka Cowden MD on 02/06/2010   Method used:   Electronically to        [facility]. [phone]* (retail)       [address]       [place], [place]  [address]       Ph: [phone]       Fax: [phone]   RxID:   [id number]

## 2011-01-24 NOTE — Miscellaneous (Signed)
Summary: Vaccine Rx  Vaccine Rx   Imported By: Lester Vigo 01/04/2011 11:13:12  _____________________________________________________________________  External Attachment:    Type:   Image     Comment:   External Document

## 2011-01-24 NOTE — Progress Notes (Signed)
Summary: samples  Phone Note Call from Patient   Caller: Patient Call For: [name] Summary of Call: pt want to no if he can get samples of asthma med not sure of the name and also a sample of nasonex Initial call taken by: Rickard Patience,  July 12, 2010 9:34 AM  Follow-up for Phone Call        samples left up front for pt of the nasonex and the dulera. pt is aware [name] [facility]  July 12, 2010 9:42 AM

## 2011-01-24 NOTE — Assessment & Plan Note (Signed)
Summary: Pulmonary/ ext f/u ov with medcal review/discussion   Copy to:  Dr. Romeo Apple Primary Provider/Referring Provider:  none  CC:  Asthma follow-up. Pt states he is doing well. No complaints today.Marland Kitchen  History of Present Illness: A 67 yobm never smoker with chronic severe asthma  with best FEV1 2.2 L  2/02 and documented non-adherence  Mainly lives off samples of inhalers and nose sprays- "cant afford"  September 21, 2009 ov worse sob/chest tightness off meds again, tells me now also off shots for months now. --refer to allergy    October 25, 2009- Allergic asthma Off antihistamines, returning at his request for allergy re-testing. Feels well. He always wheezes some and admits cooler weather makes this a little worse. Denies fever, purulent, chest pain or palpitation.  Skin Test- Strongly Pos grass, dust mite and endodermals.   January 11, 2010- Allergic asthma Doing ok building allergy vaccine here. No reactions or problems. Asthma is "as good as it gets" now. Somedays he is clear with no wheeze, then weather  changes and he may wheeze a little bit. It doesn't keep him awake or keep him from doing what he wants to do. Sometimes productive cough.   January 26, 2010 Acute visit.  Pt c/o wheezing x 2 days.  He also c/o soreness across chest and back worse with cough, has had similar problems  in past and also has assoc mild dysphagia.   Also has prod cough with cleatr sputum.   February 06, 2010 Acute visit.  Pt c/o prod cough with yellow sputum x 1 day.  He also c/o wheezing since 2/12 with increased need for neb and new left flank pain and polyuria but no dysuria.    February 20, 2010 --Returns for follow up and med review. Last visit w/ exacerbation , changed from symbicort to dulera. Tx w/ abx/steroid taper. He is feeling better and back to baseline.   March 20, 2010 f/u 1 wk.-not better,sob -same,cough-white, yellow ,wheezing = baseline for him, not waking up or using rescue at all.   Pt denies any significant sore throat, dysphagia, itching, sneezing,  nasal congestion or excess secretions,  fever, chills, sweats, unintended wt loss, pleuritic or exertional cp, hempoptysis, change in activity tolerance  orthopnea pnd or leg swelling.   Current Medications (verified): 1)  Nasonex 50 Mcg/act  Susp (Mometasone Furoate) .Marland Kitchen.. 1 Puff in Each Notril Two Times A Day 2)  Dulera 200-5 Mcg/act Aero (Mometasone Furo-Formoterol Fum) .... 2 Puffs First Thing  in Am and 2 Puffs Again in Pm About 12 Hours Later 3)  Adult Aspirin Ec Low Strength 81 Mg  Tbec (Aspirin) .... Take 1 Tablet By Mouth Once A Day 4)  Vitamin D 1000 Unit Tabs (Cholecalciferol) .... Take 1 Tablet By Mouth Once A Day 5)  Zegerid 40-1100 Mg Caps (Omeprazole-Sodium Bicarbonate) .... Take  One 30-60 Min Before First Meal of The Day and At Bedtime 6)  Calcium Carbonate-Vitamin D 600-400 Mg-Unit  Tabs (Calcium Carbonate-Vitamin D) .... Take 1 Tablet By Mouth Two Times A Day 7)  Allergy Vaccine New Start Here 8)  Xopenex Hfa 45 Mcg/act Aero (Levalbuterol Tartrate) .Marland Kitchen.. 1-2 Puffs Every 4-6 Hours As Needed 9)  Albuterol Sulfate (2.5 Mg/1ml) 0.083%  Nebu (Albuterol Sulfate) .Marland Kitchen.. 1 Treatmen Every 4-6 Hours As Needed 10)  Xopenex 1.25 Mg/5ml Nebu (Levalbuterol Hcl) .... One Treatment Every 4-6 Hours As Needed 11)  Flutter Valve .... Use Every 4 Hours As Needed 12)  Mucus  Relief 400 Mg  Tabs (Guaifenesin) .... 2-3 Every 12 Hours As Needed 13)  Aleve 220 Mg Tabs (Naproxen Sodium) .... As Needed Arthritis Pain Per Bottle  Allergies (verified): No Known Drug Allergies  Past History:  Past Medical History: ASTHMA (ICD-493.90)   --chronic severe asthma  with best FEV1 2.2 L documented 2/02 and       documented non-adherence  Mainly lives off samples of inhalers and nose sprays    - HFA  75% June 15, 2009 > 90% August 02, 2009 > 100% December 27, 2009     - Off allergy vaccines x7/2010   > highly allergic skin tests per Dr Maple Hudson >  restart vaccine November 06, 2009  SHOULDER PAIN, RIGHT (ICD-719.41) FREQUENCY, URINARY (ICD-788.41) HYPERTENSION, BORDERLINE (ICD-401.9) Shingles T 8 Left 9/01 HEALTH MAINTENANCE...........................................................................Marland KitchenWert    - Colonoscopy 12/1989    - dT  4/06    - Pneumovax 3/04 and 05/2009     -Flu declined  October 17, 2008 and September 21, 2009      -CPX  May 31, 2009     - Meds reviewed with pt education and computerized med calendar adjusted  October 19, 2009, February 20, 2010      Vital Signs:  Patient profile:   70 year old male Height:      68 inches (172.72 cm) Weight:      205.50 pounds (93.41 kg) BMI:     31.36 O2 Sat:      97 % on Room air Temp:     97.2 degrees F (36.22 degrees C) oral Pulse rate:   72 / minute BP sitting:   150 / 82  (left arm) Cuff size:   regular  Vitals Entered By: Michel Bickers CMA (March 20, 2010 9:55 AM)  O2 Sat at Rest %:  97 O2 Flow:  Room air CC: Asthma follow-up. Pt states he is doing well. No complaints today. Comments Medications reviewed with the patient. Daytime phone number verified. Michel Bickers CMA  March 20, 2010 9:56 AM   Physical Exam  Additional Exam:  General: A/Ox3; pleasant and cooperative, a bit more anxious than baseline, still somewhat childlike affect  205 November 06, 2009  > 199, Jan 20- 204 > 203 January 26, 2010 > 206 February 06, 2010 >>202 February 20, 2010 > 205 March 20, 2010  SKIN: no rash, lesions NODES: no lymphadenopathy HEENT: [place]/AT, EOM- WNL, Conjuctivae- clear, PERRLA, TM-WNL, Nose- clear, Throat- clear and wnl NECK: Supple w/ fair ROM, JVD- none, normal carotid impulses w/o bruits Thyroid- CHEST: CTA no wheezing except @ fvc HEART: RRR, no m/g/r heard no edema ABDOMEN: Soft and nl; neg cvat HYQ:MVHQ, nl pulses, no clubbing, no calf tenderness   Impression & Recommendations:  Problem # 1:  ASTHMA (ICD-493.90) DDX of  difficult airways managment all start with  A and  include Adherence, Ace Inhibitors, Acid Reflux, Active Sinus Disease, Alpha 1 Antitripsin deficiency, Anxiety masquerading as Airways dz,  ABPA,  allergy(esp in young), Aspiration (esp in elderly), Adverse effects of DPI,  Active smokers, plus one B  = Beta blocker use..    I had an extended discussion with the patient today lasting 15 to 20 minutes of a 25 minute visit on the following issues:   Still struggling with adherence issues, had albuterol in the maint bag ....   Each maintenance medication was reviewed in detail including most importantly the difference between maintenance and as needed and  under what circumstances the prns are to be used. This was done in the context of a medication calendar review which provided the patient with a user-friendly unambiguous mechanism for medication administration and reconciliation and provides an action plan for all active problems. It is critical that this be shown to every doctor  for modification during the office visit if necessary so the patient can use it as a working document.   I spent extra time with the patient today explaining optimal mdi  technique.  This improved from  90-100%. reviewed dulera count  Other Orders: Est. Patient Level IV (16109)  Patient Instructions: 1)  See calendar for specific medication instructions and bring it back for each and every office visit for every healthcare provider you see.  Without it,  you may not receive the best quality medical care that we feel you deserve.  2)  Please schedule a follow-up appointment in 6 weeks, sooner if needed  3)  DULERA COUNT IS 76

## 2011-01-24 NOTE — Miscellaneous (Signed)
Summary: Injection Record/[facility]  Injection Record/[facility]   Imported By: Lanelle Bal 04/25/2010 14:24:18  _____________________________________________________________________  External Attachment:    Type:   Image     Comment:   External Document

## 2011-01-24 NOTE — Miscellaneous (Signed)
Summary: [employer] se[employer]   Imported By: Sherian Rein 11/14/2010 08:21:43  _____________________________________________________________________  External Attachment:    Type:   Image     Comment:   External Document

## 2011-01-24 NOTE — Assessment & Plan Note (Signed)
Summary: Primary svc/  acute eval asthma with hfa teaching   Copy to:  Dr. Romeo Apple Primary Provider/Referring Provider:  none  CC:  Followup.  Pt c/o chest tightness x 1 wk.  States that he relates this to change in weather.  He states that he has had some wheezing just since this am..  History of Present Illness: June 30, 2009--Returns for follow up. Brought all his meds. He has a Symbicort which he had filled at pharmacy- 46 puffs left. September 21, 2009 ov with med calendar not accurate for months because not taking shots for months even though listed on calendar   symbicort does not add up   September 21, 2009 ov worse sob/chest tightness off meds again, tells me now also off shots for months now. --refer to allergy   October 02, 2009- Allergic asthma He decided he wanted to restart shots bcause he has been more congested since being off.  --to return for allergy testing.   October 19, 2009--Returns for follow up and no complaints. Symbicort does not add up, says he had 2 samples, only 9 doses used out of current symbicort inhaler. pt ed given , med calnedar adjusted. No flare since last visit. Denies chest pain, dyspnea, orthopnea, hemoptysis, fever, n/v/d, edema, headache.   October 25, 2009- Allergic asthma Off antihistamines, returning at his request for allergy re-testing. Feels well. He always wheezes some and admits cooler weather makes this a little worse. Denies fever, purulent, chest pain or palpitation.  Skin Test- Strongly Pos grass, dust mite and endodermals.   November 06, 2009 3 wk followup.  Pt c/o right calf pain since this am.  Also c/o increased reflux symptoms- mainly when he lies down.  No h/o leg injury numbness weakness or swelling.   December 27, 2009 Followup.  Pt c/o chest tightness x 1 wk.  States that he relates this to change in weather.  He states that he has had some wheezing just since this am. No purulent sputum. "Ran out of symbicort this am".   Pt  denies any significant sore throat, dysphagia, itching, sneezing,  nasal congestion or excess secretions,  fever, chills, sweats, unintended wt loss, pleuritic or exertional cp, hempoptysis, change in activity tolerance  orthopnea pnd or leg swelling. no saba today  Current Medications (verified): 1)  Nasacort Aq 55 Mcg/act Aers (Triamcinolone Acetonide(Nasal)) .Marland Kitchen.. 1 Spray Each Nostril Two Times A Day 2)  Nasonex 50 Mcg/act  Susp (Mometasone Furoate) .Marland Kitchen.. 1 Puff in Each Notril Two Times A Day 3)  Symbicort 160-4.5 Mcg/act  Aero (Budesonide-Formoterol Fumarate) .... 2 Puffs Two Times A Day 4)  Adult Aspirin Ec Low Strength 81 Mg  Tbec (Aspirin) .... Take 1 Tablet By Mouth Once A Day 5)  Vitamin D 1000 Unit Tabs (Cholecalciferol) .... Take 1 Tablet By Mouth Once A Day 6)  Zegerid 40-1100 Mg Caps (Omeprazole-Sodium Bicarbonate) .... Take 1 Tablet By Mouth Once A Day Before Meals 7)  Ventolin Hfa 108 (90 Base) Mcg/act Aers (Albuterol Sulfate) .Marland Kitchen.. 1-2 Puffs Every 4-6 Hours As Needed 8)  Albuterol Sulfate (2.5 Mg/57ml) 0.083%  Nebu (Albuterol Sulfate) .... Use Every 4-6 Hours As Needed 9)  Xopenex 1.25 Mg/30ml Nebu (Levalbuterol Hcl) .... One Treatment Every 4-6 Hours As Needed 10)  Flutter Valve .... Use Every 4 Hours As Needed 11)  Mucus Relief 400 Mg  Tabs (Guaifenesin) .... 2-3 Every 12 Hours As Needed 12)  Aleve 220 Mg Tabs (Naproxen Sodium) .... As  Needed Arthritis Pain Per Bottle 13)  Allergy Vaccine New Start Here  Allergies (verified): No Known Drug Allergies  Past History:  Past Medical History: ASTHMA (ICD-493.90)   --chronic severe asthma  with best FEV1 2.2 L documented 2/02 and       documented non-adherence  Mainly lives off samples of inhalers and nose sprays    - HFA  75% June 15, 2009 > 90% August 02, 2009 > 100% December 27, 2009     - Off allergy vaccines x7/2010   > highly allergic skin tests per Dr Maple Hudson > restart vaccine November 06, 2009  SHOULDER PAIN, RIGHT  (ICD-719.41) FREQUENCY, URINARY (ICD-788.41) HYPERTENSION, BORDERLINE (ICD-401.9) Shingles T 8 Left 9/01 HEALTH MAINTENANCE.......................................................................Marland KitchenWert    - Colonoscopy 12/1989    - dT  4/06    - Pneumovax 3/04 and 05/2009     -Flu declined  October 17, 2008 and September 21, 2009      -CPX  May 31, 2009     - Meds reviewed with pt education and computerized med calendar adjusted  October 19, 2009      Vital Signs:  Patient profile:   70 year old male Weight:      199 pounds BMI:     30.37 O2 Sat:      94 % on Room air Temp:     97.7 degrees F oral Pulse rate:   75 / minute BP sitting:   146 / 90  (left arm)  Vitals Entered By: Vernie Murders (December 27, 2009 9:01 AM)  O2 Flow:  Room air  Physical Exam  Additional Exam:  General: A/Ox3; pleasant and cooperative, NAD, casual affect  wt 204 September 21, 2009 >> 205 November 06, 2009  > 199 SKIN: no rash, lesions NODES: no lymphadenopathy HEENT: [place]/AT, EOM- WNL, Conjuctivae- clear, PERRLA, TM-WNL, Nose- clear, Throat- clear and wnl NECK: Supple w/ fair ROM, JVD- none, normal carotid impulses w/o bruits Thyroid- CHEST: Diminished BS with pan exp wheeze and moderate increase exp time. HEART: RRR, no m/g/r heard no edema ABDOMEN: Soft and nl; BJY:NWGN, nl pulses, no clubbing, no calf tenderness   Impression & Recommendations:  Problem # 1:  ASTHMA (ICD-493.90) DDX of  difficult airways managment all start with A and  include Adherence, Ace Inhibitors, Acid Reflux, Active Sinus Disease, Alpha 1 Antitripsin deficiency, Anxiety masquerading as Airways dz,  ABPA,  allergy(esp in young), Aspiration (esp in elderly), Adverse effects of DPI,  Active smokers, plus one B  = Beta blocker use..    Adherence remains a major challenge here despite efforts to help him as much as possible with meds/ samples/ instructions  I spent extra time with the patient today explaining optimal mdi   technique.  This improved from  90> 100%   See instructions for specific recommendations   Medications Added to Medication List This Visit: 1)  Prednisone 10 Mg Tabs (Prednisone) .... 4 each am x 2 days,  3 x 2days, 2x2 days, and 1x2 days  Other Orders: Est. Patient Level III (56213) HFA Instruction [phone])  Patient Instructions: 1)  Prednisone  4 each am x 2 days,  3 x 2days, 2x2 days, and 1x2 days  2)  See calendar for specific medication instructions and bring it back for each and every office visit for every healthcare provider you see.  Without it,  you may not receive the best quality medical care that we feel you deserve.  3)  Please  schedule a follow-up appointment in 6 weeks, sooner if needed  Prescriptions: PREDNISONE 10 MG  TABS (PREDNISONE) 4 each am x 2 days,  3 x 2days, 2x2 days, and 1x2 days  #20 x 0   Entered and Authorized by:   Nyoka Cowden MD   Signed by:   Nyoka Cowden MD on 12/27/2009   Method used:   Electronically to        [facility]. [phone]* (retail)       [address]       [place], [place]  [address]       Ph: [phone]       Fax: [phone]   RxID:   [id number]

## 2011-01-24 NOTE — Assessment & Plan Note (Signed)
Summary: Pulmonary/ f/u ov   Copy to:  Dr. Romeo Apple Primary Provider/Referring Provider:  none  CC:  Cough- improved.  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   January 11, 2010- Allergic asthma Doing ok building allergy vaccine here. No reactions or problems. Asthma is "as good as it gets" now. Somedays he is clear with no wheeze, then weather  changes and he may wheeze a little bit. It doesn't keep him awake or keep him from doing what he wants to do. Sometimes productive cough.   May 16, 2010- Allergic asthma, Rhinitis Returning for allergy f/u. Sees Dr Sherene Sires regularly for primary pulmonary. He has built allergy vaccine here to maintenance at 1:50 with no reactions. He says rhinitis and asthma have done well through the Spring pollen season. Needs refill Nasonex. Last used Xopenex rescue inhaler 2-3 days ago. Denies nasal congestion, sneeze, blowing or headache.  November 12, 2010- - Allergic asthma, Rhinitis Nurse-CC: follow up visit-allergies. Due to advance to 1:10 w/ next vaccineorder. Says he's doing ok. Declines flu vax. No seasonal flare of asthma- no ER trips or prednisone. Ne recent need for either nebulizer or rescue inhaler  November 19, 2010 ov cc cough and congestion a little worse x sev days.  December 11, 2010 ov cough better, breathing ok DULERA Count  121  > count is 39  should be 34, no sob over baseline or need for rescue. Pt denies any significant sore throat, dysphagia, itching, sneezing,  nasal congestion or excess secretions,  fever, chills, sweats, unintended wt loss, pleuritic or exertional cp, hempoptysis, change in activity tolerance  orthopnea pnd or leg swelling       Current Medications (verified): 1)  Nasonex 50 Mcg/act  Susp (Mometasone Furoate) .Marland Kitchen.. 1 Puff in Each Notril Two Times A Day 2)  Dulera 200-5 Mcg/act Aero (Mometasone Furo-Formoterol Fum) .... 2 Puffs First Thing  in Am and 2 Puffs  Again in Pm About 12 Hours Later 3)  Adult Aspirin Ec Low Strength 81 Mg  Tbec (Aspirin) .... Take 1 Tablet By Mouth Once A Day 4)  Pepcid 20 Mg Tabs (Famotidine) .Marland Kitchen.. 1 Tab By Mouth in The Morning and 1 At Bedtime 5)  Citrucel  Powd (Methylcellulose (Laxative)) .Marland Kitchen.. 1 Teaspoon in The Morning and 1 At Bedtime 6)  Allergy Vaccine  1:10 Gh .... Next Order 7)  Xopenex Hfa 45 Mcg/act Aero (Levalbuterol Tartrate) .... 2 Puffs Every 4 Hours As Needed 8)  Albuterol Sulfate (2.5 Mg/7ml) 0.083%  Nebu (Albuterol Sulfate) .Marland Kitchen.. 1 Treatmen Every 4-6 Hours As Needed 9)  Flutter Valve .... Use Every 4 Hours As Needed 10)  Mucus Relief 400 Mg  Tabs (Guaifenesin) .... 2-3 Every 12 Hours As Needed 11)  Aleve 220 Mg Tabs (Naproxen Sodium) .... Per Bottle As Needed For Joint Pain 12)  Gas-X Extra Strength 125 Mg Caps (Simethicone) .... Per Box As Needed Gas or Bloating 13)  Chloraseptic Sore Throat 1000 Mg/70ml Liqd (Acetaminophen) .... As Directed Per Bottle As Needed  Allergies (verified): No Known Drug Allergies  Past History:  Past Medical History: ASTHMA (ICD-493.90)   --chronic severe asthma  with best FEV1 2.2 L documented 2/202 and       documented non-adherence  Mainly lives off samples of inhalers and nose sprays    - HFA  75% June 15, 2009 > 90% August 02, 2009 > 100% December 27, 2009     - Off allergy  vaccines x7/2010   > highly allergic skin tests per Dr Maple Hudson > restart vaccine November 06, 2009  SHOULDER PAIN, RIGHT (ICD-719.41) FREQUENCY, URINARY (ICD-788.41) HYPERTENSION, BORDERLINE (ICD-401.9) Shingles T 8 Left 9/01 HEALTH MAINTENANCE...........................................................................................Marland KitchenWert    - Colonoscopy 12/1989    - dT  4/06    - Pneumovax 3/04 and 05/2009     -Flu declined  October 17, 2008 and September 21, 2009      -CPX   May 31, 2009     - Meds reviewed with pt education and computerized med calendar adjusted  October 19, 2009, February 20, 2010, September 10, 2010       Vital Signs:  Patient profile:   70 year old male Weight:      203 pounds O2 Sat:      95 % on Room air Temp:     97.4 degrees F oral Pulse rate:   70 / minute BP sitting:   124 / 76  (left arm)  Vitals Entered By: Vernie Murders (December 11, 2010 9:51 AM)  O2 Flow:  Room air  Physical Exam  Additional Exam:  wt  205 November 06, 2009    > 203 December 11, 2010 >  203 December 11, 2010  SKIN: no rash, lesions NODES: no lymphadenopathy ,Periorbital edema,  TM-WNL, Nose- narrow on right/ septal deviation, Throat- clear and wnl, no erythema or drainage   NECK: Supple w/ fair ROM, JVD- none, normal carotid impulses w/o bruits Thyroid-, not hoarse CHEST: Prolonged expiratory phase, coarse sounds in bases but no wheeze or cough HEART: RRR, no m/g/r heard no edema ABDOMEN: Soft and nl;  ZOX:WRUE, nl pulses, no clubbing, no calf tenderness     Impression & Recommendations:  Problem # 1:  ASTHMA (ICD-493.90) All goals of asthma met including optimal function and elimination of symptoms with minimum need for rescue therapy. Contingencies discussed today including the rule of two's.    Each maintenance medication was reviewed in detail including most importantly the difference between maintenance and as needed and under what circumstances the prns are to be used. This was done in the context of a medication calendar review which provided the patient with a user-friendly unambiguous mechanism for medication administration and reconciliation and provides an action plan for all active problems. It is critical that this be shown to every doctor  for modification during the office visit if necessary so the patient can use it as a working document.   Other Orders: Est. Patient Level III (45409)  Patient Instructions: 1)  See calendar for specific medication instructions and bring it back for each and every office visit for every healthcare provider you see.   Without it,  you may not receive the best quality medical care that we feel you deserve.  2)  Please schedule a follow-up appointment in 4  weeks, sooner if needed  3)  Late add dulera count is 39 and new sample given today

## 2011-01-24 NOTE — Assessment & Plan Note (Signed)
Summary: Primary svc/ acute eval for asthma/ non adherent with PPI   Copy to:  Dr. Romeo Apple Primary Provider/Referring Provider:  none  CC:  Followup.  Pt c/o cough x 2 days- non prod.  Chest feels sore from cough.  .  History of Present Illness: October 19, 2009--Returns for follow up and no complaints. Symbicort does not add up, says he had 2 samples, only 9 doses used out of current symbicort inhaler. pt ed given , med calnedar adjusted. No flare since last visit. Denies chest pain, dyspnea, orthopnea, hemoptysis, fever, n/v/d, edema, headache.   October 25, 2009- Allergic asthma Off antihistamines, returning at his request for allergy re-testing. Feels well. He always wheezes some and admits cooler weather makes this a little worse. Denies fever, purulent, chest pain or palpitation.  Skin Test- Strongly Pos grass, dust mite and endodermals.   January 11, 2010- Allergic asthma Doing ok building allergy vaccine here. No reactions or problems. Asthma is "as good as it gets" now. Somedays he is clear with no wheeze, then weather  changes and he may wheeze a little bit. It doesn't keep him awake or keep him from doing what he wants to do. Sometimes productive cough.   May 16, 2010- Allergic asthma, Rhinitis Returning for allergy f/u. Sees Dr Sherene Sires regularly for primary pulmonary. He has built allergy vaccine here to maintenance at 1:50 with no reactions. He says rhinitis and asthma have done well through the Spring pollen season. Needs refill Nasonex. Last used Xopenex rescue inhaler 2-3 days ago.  June 18, 2010 6 wk followup.  Pt states that overall his breathing has been doing well.  No complaints today. rare need for xopnenex hfa and never needs alb neb. rec no change in rx  July 31, 2010 ov Followup.  Pt c/o cough x 2 days- non prod.  Chest feels sore from cough. dulera count is accurate.  used neb once in last 48 hours better. no purulent sputum, ex or lateralizing cp. mild d dyspagia  not consistent with ppi as per calendar.      Current Medications (verified): 1)  Nasonex 50 Mcg/act  Susp (Mometasone Furoate) .Marland Kitchen.. 1 Puff in Each Notril Two Times A Day 2)  Dulera 200-5 Mcg/act Aero (Mometasone Furo-Formoterol Fum) .... 2 Puffs First Thing  in Am and 2 Puffs Again in Pm About 12 Hours Later 3)  Adult Aspirin Ec Low Strength 81 Mg  Tbec (Aspirin) .... Take 1 Tablet By Mouth Once A Day 4)  Zegerid 40-1100 Mg Caps (Omeprazole-Sodium Bicarbonate) .... Take  One 30-60 Min Before First Meal of The Day and At Bedtime 5)  Calcium Carbonate-Vitamin D 600-400 Mg-Unit  Tabs (Calcium Carbonate-Vitamin D) .... Take 1 Tablet By Mouth Two Times A Day 6)  Allergy Vaccine Building-Up 7)  Xopenex Hfa 45 Mcg/act Aero (Levalbuterol Tartrate) .Marland Kitchen.. 1-2 Puffs Every 4-6 Hours As Needed 8)  Albuterol Sulfate (2.5 Mg/40ml) 0.083%  Nebu (Albuterol Sulfate) .Marland Kitchen.. 1 Treatmen Every 4-6 Hours As Needed 9)  Xopenex 1.25 Mg/84ml Nebu (Levalbuterol Hcl) .... One Treatment Every 4-6 Hours As Needed 10)  Flutter Valve .... Use Every 4 Hours As Needed 11)  Mucus Relief 400 Mg  Tabs (Guaifenesin) .... 2-3 Every 12 Hours As Needed 12)  Aleve 220 Mg Tabs (Naproxen Sodium) .... As Needed Arthritis Pain Per Bottle  Allergies (verified): No Known Drug Allergies  Past History:  Past Medical History: ASTHMA (ICD-493.90)   --chronic severe asthma  with best FEV1 2.2  L documented 2/202 and       documented non-adherence  Mainly lives off samples of inhalers and nose sprays    - HFA  75% June 15, 2009 > 90% August 02, 2009 > 100% December 27, 2009     - Off allergy vaccines x7/2010   > highly allergic skin tests per Dr Maple Hudson > restart vaccine November 06, 2009  SHOULDER PAIN, RIGHT (ICD-719.41) FREQUENCY, URINARY (ICD-788.41) HYPERTENSION, BORDERLINE (ICD-401.9) Shingles T 8 Left 9/01 HEALTH MAINTENANCE...............................................................................Marland KitchenWert    - Colonoscopy 12/1989.     - dT  4/06    - Pneumovax 3/04 and 05/2009     -Flu declined  October 17, 2008 and September 21, 2009      -CPX  May 31, 2009     - Meds reviewed with pt education and computerized med calendar adjusted  October 19, 2009, February 20, 2010      Vital Signs:  Patient profile:   70 year old male Weight:      206 pounds O2 Sat:      94 % on Room air Temp:     98.2 degrees F oral Pulse rate:   82 / minute BP sitting:   132 / 80  (left arm)  Vitals Entered By: Vernie Murders (July 31, 2010 10:12 AM)  O2 Flow:  Room air  Physical Exam  Additional Exam:  General: A/Ox3; pleasant and cooperative, calm/ reserved  205 November 06, 2009  >  206 February 06, 2010 >> 202 February 20, 2010 > 205 March 20, 2010 > 209 May 03, 2010 > 206 July 31, 2010  SKIN: no rash, lesions NODES: no lymphadenopathy HEENT: Walsh/AT, EOM- WNL, Conjuctivae- clear, PERRLA, TM-WNL, Nose- narrow on right/ septal deviation, Throat- clear and wnl, no erythema or drainage  Mallampati  III NECK: Supple w/ fair ROM, JVD- none, normal carotid impulses w/o bruits Thyroid-, not hoarse CHEST: Prolonged expiratory phase, minimal end exp wheeze HEART: RRR, no m/g/r heard no edema ABDOMEN: Soft and nl;  VQQ:VZDG, nl pulses, no clubbing, no calf tenderness   Impression & Recommendations:  Problem # 1:  ASTHMA (ICD-493.90) Severe, chronic , with documented non adherence but seems to be doing better on dulera count for now  See instructions for specific recommendations   Problem # 2:  GERD (ICD-530.81)  His updated medication list for this problem includes:    Zegerid 40-1100 Mg Caps (Omeprazole-sodium bicarbonate) .Marland Kitchen... Take  one 30-60 min before first meal of the day and at bedtime  Midline pain with dysphagia, no ex or true pleuritic component.  reviewed diet and meds  . Each maintenance medication was reviewed in detail including most importantly the difference between maintenance and as needed and under what  circumstances the prns are to be used.  This was done in the context of a medication calendar review which provided the patient with a user-friendly unambiguous mechanism for medication administration and reconciliation and provides an action plan for all active problems. It is critical that this be shown to every doctor  for modification during the office visit if necessary so the patient can use it as a working document.   Medications Added to Medication List This Visit: 1)  Prednisone 10 Mg Tabs (Prednisone) .... 4 each am x 2days, 2x2days, 1x2days and stop  Other Orders: Prescription Created Electronically [id number]) Est. Patient Level IV (43329)  Patient Instructions: 1)  Prednisone 4 each am x 2days, 2x2days, 1x2days and  stop  2)  Take omeprazole as per calendar twice daily 3)  See calendar for specific medication instructions and bring it back for each and every office visit for every healthcare provider you see.  Without it,  you may not receive the best quality medical care that we feel you deserve.  4)  Please schedule a follow-up appointment in  4weeks, sooner if needed  5)  COUNT ON DULERA IS 60  Prescriptions: PREDNISONE 10 MG  TABS (PREDNISONE) 4 each am x 2days, 2x2days, 1x2days and stop  #14 x 0   Entered and Authorized by:   Nyoka Cowden MD   Signed by:   Nyoka Cowden MD on 07/31/2010   Method used:   Electronically to        [facility] [phone]* (retail)       [address]       [place], [place]  [address]       Ph: [phone]       Fax: [phone]   RxID:   [id number]

## 2011-02-07 ENCOUNTER — Ambulatory Visit: Payer: Self-pay | Admitting: Gastroenterology

## 2011-02-07 ENCOUNTER — Ambulatory Visit (INDEPENDENT_AMBULATORY_CARE_PROVIDER_SITE_OTHER): Payer: Medicare Other

## 2011-02-07 DIAGNOSIS — J301 Allergic rhinitis due to pollen: Secondary | ICD-10-CM

## 2011-02-15 ENCOUNTER — Ambulatory Visit (INDEPENDENT_AMBULATORY_CARE_PROVIDER_SITE_OTHER): Payer: Medicare Other

## 2011-02-15 DIAGNOSIS — J301 Allergic rhinitis due to pollen: Secondary | ICD-10-CM

## 2011-02-20 ENCOUNTER — Ambulatory Visit (INDEPENDENT_AMBULATORY_CARE_PROVIDER_SITE_OTHER): Payer: Medicare Other | Admitting: Internal Medicine

## 2011-02-20 ENCOUNTER — Ambulatory Visit (INDEPENDENT_AMBULATORY_CARE_PROVIDER_SITE_OTHER): Payer: Medicare Other

## 2011-02-20 ENCOUNTER — Encounter: Payer: Self-pay | Admitting: Internal Medicine

## 2011-02-20 DIAGNOSIS — J45909 Unspecified asthma, uncomplicated: Secondary | ICD-10-CM

## 2011-02-20 DIAGNOSIS — J301 Allergic rhinitis due to pollen: Secondary | ICD-10-CM

## 2011-02-28 NOTE — Assessment & Plan Note (Signed)
Summary: Primay svc/ f/u ov non adherence   Copy to:  Dr. Romeo Apple Primary Provider/Referring Provider:  none  CC:  Cough- better.  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   January 11, 2010- Allergic asthma Doing ok building allergy vaccine here. No reactions or problems. Asthma is "as good as it gets" now. Somedays he is clear with no wheeze, then weather  changes and he may wheeze a little bit. It doesn't keep him awake or keep him from doing what he wants to do. Sometimes productive cough.   May 16, 2010- Allergic asthma, Rhinitis Returning for allergy f/u. Sees Dr Sherene Sires regularly for primary pulmonary. He has built allergy vaccine here to maintenance at 1:50 with no reactions. He says rhinitis and asthma have done well through the Spring pollen season. Needs refill Nasonex. Last used Xopenex rescue inhaler 2-3 days ago. Denies nasal congestion, sneeze, blowing or headache.  November 12, 2010- - Allergic asthma, Rhinitis Nurse-CC: follow up visit-allergies. Due to advance to 1:10 w/ next vaccineorder. Says he's doing ok. Declines flu vax. No seasonal flare of asthma- no ER trips or prednisone. Ne recent need for either nebulizer or rescue inhaler  November 19, 2010 ov cc cough and congestion a little worse x sev days.  December 11, 2010 ov cough better, breathing ok DULERA Count  121  > count is 39  should be 34, no sob over baseline or need for rescue.   rec no change  January 09, 2011 ov cough and sob better. dulera count 64 denies rescue need or noct complaints.   February 20, 2011 ov with count at 72 (should be 44) but cc cough/sob better. Pt denies any significant sore throat, dysphagia, itching, sneezing,  nasal congestion or excess secretions,  fever, chills, sweats, unintended wt loss, pleuritic or exertional cp, hempoptysis, change in activity tolerance  orthopnea pnd or leg swelling Pt also denies any obvious  fluctuation in symptoms with weather or environmental change or other alleviating or aggravating factors.     Pt denies any increase in rescue therapy over baseline, denies waking up needing it or having early am cough/sob.   Current Medications (verified): 1)  Nasonex 50 Mcg/act  Susp (Mometasone Furoate) .Marland Kitchen.. 1 Puff in Each Notril Two Times A Day 2)  Dulera 200-5 Mcg/act Aero (Mometasone Furo-Formoterol Fum) .... 2 Puffs First Thing  in Am and 2 Puffs Again in Pm About 12 Hours Later 3)  Adult Aspirin Ec Low Strength 81 Mg  Tbec (Aspirin) .... Take 1 Tablet By Mouth Once A Day 4)  Pepcid 20 Mg Tabs (Famotidine) .Marland Kitchen.. 1 Tab By Mouth in The Morning and 1 At Bedtime 5)  Citrucel  Powd (Methylcellulose (Laxative)) .Marland Kitchen.. 1 Teaspoon in The Morning and 1 At Bedtime 6)  Allergy Vaccine  1:10 Gh .... Next Order 7)  Xopenex Hfa 45 Mcg/act Aero (Levalbuterol Tartrate) .... 2 Puffs Every 4 Hours As Needed 8)  Albuterol Sulfate (2.5 Mg/17ml) 0.083%  Nebu (Albuterol Sulfate) .Marland Kitchen.. 1 Treatmen Every 4-6 Hours As Needed 9)  Flutter Valve .... Use Every 4 Hours As Needed 10)  Mucus Relief 400 Mg  Tabs (Guaifenesin) .... 2-3 Every 12 Hours As Needed 11)  Aleve 220 Mg Tabs (Naproxen Sodium) .... Per Bottle As Needed For Joint Pain 12)  Gas-X Extra Strength 125 Mg Caps (Simethicone) .... Per Box As Needed Gas or Bloating 13)  Chloraseptic Sore Throat 1000 Mg/3ml Liqd (Acetaminophen) .... As  Directed Per Bottle As Needed  Allergies (verified): No Known Drug Allergies  Past History:  Past Medical History: ASTHMA (ICD-493.90)   --chronic severe asthma  with best FEV1 2.2 L documented 2/202 and       documented non-adherence  Mainly lives off samples of inhalers and nose sprays    - HFA  75% June 15, 2009 > 90% August 02, 2009 >   100% December 27, 2009     - Off allergy vaccines x7/2010   > highly allergic skin tests per Dr Maple Hudson > restart vaccine November 06, 2009  SHOULDER PAIN, RIGHT (ICD-719.41) FREQUENCY,  URINARY (ICD-788.41) HYPERTENSION, BORDERLINE (ICD-401.9) Shingles T 8 Left 9/01 HEALTH MAINTENANCE...............................................................................................Marland KitchenWert    - Colonoscopy 12/1989    - dT  4/06    - Pneumovax 3/04 and 05/2009     -Flu declined  October 17, 2008 and September 21, 2009      -CPX   May 31, 2009     - Meds reviewed with pt education and computerized med calendar adjusted  October 19, 2009, February 20, 2010, September 10, 2010       Vital Signs:  Patient profile:   70 year old male Weight:      203.50 pounds O2 Sat:      93 % on Room air Temp:     97.7 degrees F oral Pulse rate:   77 / minute BP sitting:   138 / 72  (left arm)  Vitals Entered By: Vernie Murders (February 20, 2011 9:36 AM)  O2 Flow:  Room air  Physical Exam  Additional Exam:  wt  205 November 06, 2009    > 202 January 09, 2011 > 203 February 20, 2011  SKIN: no rash, lesions NODES: no lymphadenopathy ,Periorbital edema,  TM-WNL, Nose- narrow on right/ septal deviation, Throat- clear and wnl, no erythema or drainage   NECK: Supple w/ fair ROM, JVD- none, normal carotid impulses w/o bruits Thyroid-, not hoarse CHEST: Prolonged expiratory time, no sign wheeze HEART: RRR, no m/g/r heard no edema ABDOMEN: Soft and nl;  ZOX:WRUE, nl pulses, no clubbing, no calf tenderness     Impression & Recommendations:  Problem # 1:  ASTHMA (ICD-493.90)  All goals of asthma met including optimal function and elimination of symptoms with minimum need for rescue therapy. Contingencies discussed today including the rule of two's.   continue to be concerned with adherence on multiple levels esp since count on dulera not adding up to consistent two times a day dosing   Each maintenance medication was reviewed in detail including most importantly the difference between maintenance and as needed and under what circumstances the prns are to be used.  This was done in the context  of a medication calendar review which provided the patient with a user-friendly unambiguous mechanism for medication administration and reconciliation and provides an action plan for all active problems. It is critical that this be shown to every doctor  for modification during the office visit if necessary so the patient can use it as a working document. See instructions for specific recommendations   Other Orders: Est. Patient Level III (45409)  Patient Instructions: 1)  Remember your dulera is 2 puffs first thing  in am and 2 puffs again in pm about 12 hours later and we will count we you come to make sure you are keeping up with it correctly 2)  See [name] w/in 4weeks with all your medications, even over  the counter meds, separated in two separate bags, the ones you take no matter what vs the ones you stop once you feel better and take only as needed.  She will generate for you a new user friendly medication calendar that will put [place] all on the same page re: your medication use.

## 2011-03-07 ENCOUNTER — Encounter: Payer: Self-pay | Admitting: Internal Medicine

## 2011-03-07 ENCOUNTER — Ambulatory Visit (INDEPENDENT_AMBULATORY_CARE_PROVIDER_SITE_OTHER): Payer: Medicare Other

## 2011-03-07 DIAGNOSIS — J301 Allergic rhinitis due to pollen: Secondary | ICD-10-CM | POA: Insufficient documentation

## 2011-03-12 ENCOUNTER — Ambulatory Visit (INDEPENDENT_AMBULATORY_CARE_PROVIDER_SITE_OTHER): Payer: Medicare Other | Admitting: Gastroenterology

## 2011-03-12 ENCOUNTER — Encounter: Payer: Self-pay | Admitting: Gastroenterology

## 2011-03-12 ENCOUNTER — Ambulatory Visit (INDEPENDENT_AMBULATORY_CARE_PROVIDER_SITE_OTHER): Payer: Medicare Other

## 2011-03-12 VITALS — BP 134/80 | HR 84 | Ht 68.0 in | Wt 206.0 lb

## 2011-03-12 DIAGNOSIS — R1319 Other dysphagia: Secondary | ICD-10-CM

## 2011-03-12 DIAGNOSIS — K219 Gastro-esophageal reflux disease without esophagitis: Secondary | ICD-10-CM

## 2011-03-12 DIAGNOSIS — R1314 Dysphagia, pharyngoesophageal phase: Secondary | ICD-10-CM

## 2011-03-12 DIAGNOSIS — R1310 Dysphagia, unspecified: Secondary | ICD-10-CM

## 2011-03-12 DIAGNOSIS — J301 Allergic rhinitis due to pollen: Secondary | ICD-10-CM

## 2011-03-12 NOTE — Assessment & Plan Note (Signed)
Summary: allergy/cb  Nurse Visit   Allergies: No Known Drug Allergies  Orders Added: 1)  Allergy Injection (1) [95115] 

## 2011-03-12 NOTE — Patient Instructions (Signed)
Your procedure has been scheduled for 03/20/2011, please follow the seperate instructions.  Today you have signed a release for Dr Jarold Motto to get your previous records from Dr Ramon Dredge.

## 2011-03-12 NOTE — Progress Notes (Signed)
History of Present Illness:  This is a  70 year old African American male who presents with  Chronic GERD and also dysphagia with some painful swallowing over the last several months. He has long-term acid reflux under the care of Dr. Carman Ching. He has had several colonoscopies but denies previous endoscopic exam. Currently is on Pepcid 20 mg a day but continues with burning substernal pain and regurgitation. He denies lower gastrointestinal problems except for mild constipation  Relieved with fiber supplements. He denies melena, hematochezia, or any hepatobiliary complaints. Does not smoke, use ethanol, does use when necessary Aleve 4 degenerative arthritis his appetite is good and his weight is stable. He has had previous surgical repair of an umbilical hernia. Family history is noncontributory.  Past Medical History  Diagnosis Date  . Unspecified asthma    Past Surgical History  Procedure Date  . Hernia repair     reports that he has never smoked. He does not have any smokeless tobacco history on file. He reports that he does not drink alcohol or use illicit drugs. family history includes Brain cancer in his sister; Breast cancer in his sister; Heart disease in his mother; Kidney failure in his sister; and Lung cancer in his brother. No Known Allergies     ROS: The remainder of the 8 point ROS is negative     Physical Exam: General well developed well nourished patient in no acute distress, appearing their stated age Eyes PERRLA, no icterus fundoscopic exam per opthamologist Skin no lesions noted Neck supple, no adenopathy, no thyroid enlargement, no tenderness Chest  Markedly diminished breath sounds in both lung fields  But no wheezes rhonchi Heart no significant murmurs, gallops or rubs noted Abdomen no hepatosplenomegaly masses or tenderness, BS normal.   Extremities no acute joint lesions, edema, phlebitis or evidence of cellulitis. Neurologic patient oriented x 3, cranial  nerves intact, no focal neurologic deficits noted. Psychological mental status normal and normal affect.  Assessment and plan: I am concerned this patient may have esophageal carcinoma added to long term acid reflux and probable Barrett's mucosa. Is on inadequate acid suppression at this time and I have discontinued his Pepcid and started him on Dexilant  60 mg a day with standard antireflux maneuvers. Endoscopy and possible dilatation has been scheduled. Followed by Dr. Sandrea Hughs  For his COPD an asthmatic bronchitis. Also we'll try to obtain previous records from Dr. Carman Ching for review.

## 2011-03-15 ENCOUNTER — Telehealth: Payer: Self-pay | Admitting: Gastroenterology

## 2011-03-15 NOTE — Telephone Encounter (Signed)
Forwarded to Dr. Patterson for review.  °

## 2011-03-19 ENCOUNTER — Encounter: Payer: Self-pay | Admitting: Gastroenterology

## 2011-03-20 ENCOUNTER — Ambulatory Visit (AMBULATORY_SURGERY_CENTER): Payer: Medicare Other | Admitting: Gastroenterology

## 2011-03-20 ENCOUNTER — Other Ambulatory Visit: Payer: Self-pay | Admitting: *Deleted

## 2011-03-20 ENCOUNTER — Encounter: Payer: Self-pay | Admitting: Gastroenterology

## 2011-03-20 VITALS — BP 150/93 | HR 67 | Temp 97.8°F | Resp 16 | Ht 68.0 in | Wt 206.0 lb

## 2011-03-20 DIAGNOSIS — K222 Esophageal obstruction: Secondary | ICD-10-CM

## 2011-03-20 DIAGNOSIS — K219 Gastro-esophageal reflux disease without esophagitis: Secondary | ICD-10-CM

## 2011-03-20 DIAGNOSIS — K449 Diaphragmatic hernia without obstruction or gangrene: Secondary | ICD-10-CM

## 2011-03-20 DIAGNOSIS — R1310 Dysphagia, unspecified: Secondary | ICD-10-CM

## 2011-03-20 DIAGNOSIS — K2270 Barrett's esophagus without dysplasia: Secondary | ICD-10-CM

## 2011-03-20 DIAGNOSIS — R1319 Other dysphagia: Secondary | ICD-10-CM

## 2011-03-20 DIAGNOSIS — R6889 Other general symptoms and signs: Secondary | ICD-10-CM

## 2011-03-20 MED ORDER — RABEPRAZOLE SODIUM 20 MG PO TBEC: 20.0000 mg | DELAYED_RELEASE_TABLET | Freq: Every day | ORAL | Status: DC

## 2011-03-20 NOTE — Patient Instructions (Signed)
Discharge instructions given with verbal understanding. Handouts on dilatation diet and a hiatal hernia. Office visit in 2-2 weeks. Will be set up by office.

## 2011-03-21 ENCOUNTER — Encounter: Payer: Self-pay | Admitting: *Deleted

## 2011-03-21 ENCOUNTER — Encounter: Payer: Self-pay | Admitting: Internal Medicine

## 2011-03-21 ENCOUNTER — Telehealth: Payer: Self-pay | Admitting: *Deleted

## 2011-03-21 NOTE — Telephone Encounter (Signed)
Scheduled pt for f/u appt after ENDO per Dr Jarold Motto; 04/09/11 @ 1045am.

## 2011-03-21 NOTE — Telephone Encounter (Signed)

## 2011-03-25 ENCOUNTER — Encounter: Payer: Self-pay | Admitting: Internal Medicine

## 2011-03-26 ENCOUNTER — Encounter: Payer: Self-pay | Admitting: Gastroenterology

## 2011-03-26 NOTE — Procedures (Signed)
Summary: Upper Endoscopy  Patient: [name] Note: All result statuses are Final unless otherwise noted.  Tests: (1) Upper Endoscopy (EGD)   EGD Upper Endoscopy       DONE     [facility]     [address]     [place], [place]  [address]          ENDOSCOPY PROCEDURE REPORT          PATIENT:  Hector, Jacobson  MR#:  [id number]     BIRTHDATE:  Dec 26, 1940, 69 yrs. old  GENDER:  male          ENDOSCOPIST:  Vania Rea. Jarold Motto, MD, [facility]     Referred by:          PROCEDURE DATE:  03/20/2011     PROCEDURE:  EGD with biopsy, [address], Maloney Dilation of Esophagus     ASA CLASS:  Class II     INDICATIONS:  dysphagia          MEDICATIONS:   Fentanyl 50 mcg IV WILL NEED PROPOFOL WITH NEXT     EXAM !!!, Versed 6 mg IV     TOPICAL ANESTHETIC:  Exactacain Spray          DESCRIPTION OF PROCEDURE:   After the risks benefits and     alternatives of the procedure were thoroughly explained, informed     consent was obtained.  The LB [id number] endoscope was     introduced through the mouth and advanced to the second portion of     the duodenum, limited by retching and gagging.   The instrument     was slowly withdrawn as the mucosa was fully examined.     <<PROCEDUREIMAGES>>          A hiatal hernia was found. 5 cm. hh noted.  A stricture was found     at the gastroesophageal junction. ulcerated stricture biopsied and     dilated 396f maloney dilator.heme but no pain.  Otherwise the     examination was normal.    Retroflexed views revealed a hiatal     hernia.    The scope was then withdrawn from the patient and the     procedure completed.          COMPLICATIONS:  None          ENDOSCOPIC IMPRESSION:     1) Hiatal hernia     2) Stricture at the gastroesophageal junction     3) Otherwise normal examination     4) A hiatal hernia     1.CHRONIC GERD AND SECONDARY ESOPHAGEAL STRICTURE     2.R/O BARRETT'S MUCOSA AND ATYPIA     RECOMMENDATIONS:     1) Anti-reflux  regimen to be follow     2) Await biopsy results     3) Clear liquids until, then soft foods rest iof day. Resume     prior diet tomorrow.     4) continue PPI     OFFICE VISIT 3 WEEKS.          REPEAT EXAM:  No          ______________________________     Vania Rea. Jarold Motto, MD, [facility]          CC:          n.     eSIGNED:   Vania Rea. Patterson at 03/20/2011 02:19 PM          Lorayne Bender, [id number]  Note: An exclamation mark (!) indicates a result that was not dispersed into the flowsheet. Document Creation Date: 03/20/2011 2:19 PM _______________________________________________________________________  (1) Order result status: Final Collection or observation date-time: 03/20/2011 14:08 Requested date-time:  Receipt date-time:  Reported date-time:  Referring Physician:   Ordering Physician: Sheryn Bison [phone]) Specimen Source:  Source: Launa Grill Order Number: [id number] Lab site:

## 2011-03-26 NOTE — Letter (Signed)
Summary: Appt Reminder 2  [facility]  [address] [place], [place] [address]   Phone: [phone]  Fax: [phone]        March 21, 2011 MRN: [id number]    BRINSON TOZZI [address] [place], [place]  [address]    Dear Mr. Putz,   You have a return appointment with Dr. Jarold Motto on April 09, 2011 at 1045 am.  Please remember to bring a complete list of the medicines you are taking, your insurance card and your co-pay.  If you have to cancel or reschedule this appointment, please call before 5:00 pm the evening before to avoid a cancellation fee.  If you have any questions or concerns, please call [phone].    Sincerely,    Graciella Freer RN

## 2011-03-26 NOTE — Letter (Signed)
Summary: Appt Reminder 2  [facility]  [address] [place], [place] [address]   Phone: [phone]  Fax: [phone]        March 21, 2011 MRN: [id number]    ZEBEDIAH BEEZLEY [address] [place], [place]  [address]    Dear Mr. Crothers,   You have a return appointment with Dr. Jarold Motto on April 17,2017 at 1045am.  Please remember to bring a complete list of the medicines you are taking, your insurance card and your co-pay.  If you have to cancel or reschedule this appointment, please call before 5:00 pm the evening before to avoid a cancellation fee.  If you have any questions or concerns, please call [phone].    Sincerely,    Graciella Freer RN

## 2011-03-27 ENCOUNTER — Encounter: Payer: Medicare Other | Admitting: Adult Health

## 2011-03-27 ENCOUNTER — Ambulatory Visit (INDEPENDENT_AMBULATORY_CARE_PROVIDER_SITE_OTHER): Payer: Medicare Other

## 2011-03-27 DIAGNOSIS — J301 Allergic rhinitis due to pollen: Secondary | ICD-10-CM

## 2011-04-01 LAB — POCT I-STAT, CHEM 8
Calcium, Ion: 1.09 mmol/L — ABNORMAL LOW (ref 1.12–1.32)
Chloride: 109 mEq/L (ref 96–112)
Glucose, Bld: 168 mg/dL — ABNORMAL HIGH (ref 70–99)
HCT: 45 % (ref 39.0–52.0)
Hemoglobin: 15.3 g/dL (ref 13.0–17.0)
Potassium: 3.8 mEq/L (ref 3.5–5.1)

## 2011-04-01 LAB — DIFFERENTIAL
Basophils Absolute: 0.2 10*3/uL — ABNORMAL HIGH (ref 0.0–0.1)
Basophils Relative: 2 % — ABNORMAL HIGH (ref 0–1)
Eosinophils Absolute: 0.2 10*3/uL (ref 0.0–0.7)
Neutro Abs: 9.9 10*3/uL — ABNORMAL HIGH (ref 1.7–7.7)
Neutrophils Relative %: 84 % — ABNORMAL HIGH (ref 43–77)

## 2011-04-01 LAB — CBC
MCHC: 34.3 g/dL (ref 30.0–36.0)
MCV: 89.8 fL (ref 78.0–100.0)
Platelets: 287 10*3/uL (ref 150–400)
RDW: 13.7 % (ref 11.5–15.5)

## 2011-04-02 ENCOUNTER — Encounter: Payer: Self-pay | Admitting: Adult Health

## 2011-04-04 ENCOUNTER — Ambulatory Visit (INDEPENDENT_AMBULATORY_CARE_PROVIDER_SITE_OTHER): Payer: Medicare Other | Admitting: Adult Health

## 2011-04-04 ENCOUNTER — Other Ambulatory Visit: Payer: Self-pay | Admitting: *Deleted

## 2011-04-04 ENCOUNTER — Ambulatory Visit (INDEPENDENT_AMBULATORY_CARE_PROVIDER_SITE_OTHER): Payer: Medicare Other

## 2011-04-04 ENCOUNTER — Encounter: Payer: Self-pay | Admitting: Adult Health

## 2011-04-04 VITALS — BP 124/76 | HR 66 | Temp 96.8°F | Ht 68.0 in | Wt 204.0 lb

## 2011-04-04 DIAGNOSIS — J310 Chronic rhinitis: Secondary | ICD-10-CM

## 2011-04-04 DIAGNOSIS — K219 Gastro-esophageal reflux disease without esophagitis: Secondary | ICD-10-CM

## 2011-04-04 DIAGNOSIS — J45909 Unspecified asthma, uncomplicated: Secondary | ICD-10-CM

## 2011-04-04 DIAGNOSIS — J309 Allergic rhinitis, unspecified: Secondary | ICD-10-CM

## 2011-04-04 DIAGNOSIS — Z0000 Encounter for general adult medical examination without abnormal findings: Secondary | ICD-10-CM | POA: Insufficient documentation

## 2011-04-04 DIAGNOSIS — Z8619 Personal history of other infectious and parasitic diseases: Secondary | ICD-10-CM | POA: Insufficient documentation

## 2011-04-04 MED ORDER — PANTOPRAZOLE SODIUM 40 MG PO TBEC: 40.0000 mg | DELAYED_RELEASE_TABLET | Freq: Every day | ORAL | Status: DC

## 2011-04-04 NOTE — Assessment & Plan Note (Signed)
Compensated on present regimen.  Patient's medications were reviewed today and patient education was given. Computerized medication calendar was adjusted/completed  Plan Cont on current regimen Samples given

## 2011-04-04 NOTE — Assessment & Plan Note (Deleted)
HEALTH MAINTENANCE...............................................................................................Marland KitchenWert (PCP) - Colonoscopy 12/1989, Dr. Randa Evens ?2011  - dT 4/06 - Pneumovax 3/04 and 05/2009 -Flu declined October 17, 2008 and September 21, 2009  -CPX May 31, 2009  - Meds reviewed with pt education and computerized med calendar adjusted October 19, 2009, February 20, 2010, September 10, 2010 , 04/04/2011

## 2011-04-04 NOTE — Progress Notes (Signed)
Subjective:    Patient ID: [name], male    DOB: 1941/10/17, 70 y.o.   MRN: [id number]  HPI 69yobm never smoker born prematurely with asthma since age 47s.   January 11, 2010- Allergic asthma  Doing ok building allergy vaccine here. No reactions or problems. Asthma is "as good as it gets" now. Somedays he is clear with no wheeze, then weather changes and he may wheeze a little bit. It doesn't keep him awake or keep him from doing what he wants to do. Sometimes productive cough.   May 16, 2010- Allergic asthma, Rhinitis  Returning for allergy f/u. Sees Dr Sherene Sires regularly for primary pulmonary.  He has built allergy vaccine here to maintenance at 1:50 with no reactions. He says rhinitis and asthma have done well through the Spring pollen season. Needs refill Nasonex.  Last used Xopenex rescue inhaler 2-3 days ago.  Denies nasal congestion, sneeze, blowing or headache.   November 12, 2010- - Allergic asthma, Rhinitis  Nurse-CC: follow up visit-allergies.  Due to advance to 1:10 w/ next vaccineorder. Says he's doing ok.  Declines flu vax. No seasonal flare of asthma- no ER trips or prednisone. Ne recent need for either nebulizer or rescue inhaler  November 19, 2010 ov cc cough and congestion a little worse x sev days.  December 11, 2010 ov cough better, breathing ok DULERA Count 121 > count is 39 should be 34, no sob over baseline or need for rescue.  rec no change   January 09, 2011 ov cough and sob better. dulera count 64 denies rescue need or noct complaints.   February 20, 2011 ov with count at 72 (should be 44) but cc cough/sob better.    04/04/2011 Follow up and med review.  PT returns for 6 week follow up and med review. He has brought all his meds and we organized/update his med calendar.  He depends largely on samples. He recently had some persistent dysphagia and was seen by [facility] Dr. Jarold Motto. He underwent  endo 03/20/11 -esophageal stricture-s/p dilatation, ulcerated  stricture-bx was c/w gerd -neg for barretts. Rec for PPI . He was rx  Dexilant however has not picked up from pharmacy and unfortunately is not taking pepcid or zegerid/prevacid that he has  In his bag today. He says he misunderstood the instructions. I explained the reason for PPI and potential dangers of GERD.  He will start using his PPI meds that he has presently until Dexilant is approved at the pharmacy.  His asthma has been under control with rare use of xopenex or albuterol neb . NO ER visits. NO steroid since last ov.  He was given 3 Dulera samples today.   Review of Systems Constitutional:   No  weight loss, night sweats,  Fevers, chills, fatigue, or  lassitude.  HEENT:   No headaches,  Difficulty swallowing,  Tooth/dental problems, or  Sore throat,                No sneezing, itching, ear ache + nasal congestion, post nasal drip,   CV:  No chest pain,  Orthopnea, PND, swelling in lower extremities, anasarca, dizziness, palpitations, syncope.   GI  No heartburn, indigestion, abdominal pain, nausea, vomiting, diarrhea, change in bowel habits, loss of appetite, bloody stools.  Neg dysphagia   Resp: No shortness of breath with exertion or at rest.  No excess mucus, no productive cough,  No non-productive cough,  No coughing up of blood.  No change in color  of mucus.  No wheezing.  No chest wall deformity  Skin: no rash or lesions.  GU: no dysuria, change in color of urine, no urgency or frequency.  No flank pain, no hematuria   MS:  No joint pain or swelling.  No decreased range of motion.  No back pain.  Psych:  No change in mood or affect. No depression or anxiety.  No memory loss.         Objective:   Physical Exam GEN: A/Ox3; pleasant , NAD, well nourished   HEENT:  [place]/AT,  EACs-clear, TMs-wnl, NOSE-clear drainage  THROAT-clear, no lesions, no postnasal drip or exudate noted., several dental caries   NECK:  Supple w/ fair ROM; no JVD; normal carotid impulses w/o  bruits; no thyromegaly or nodules palpated; no lymphadenopathy.  RESP  Clear  P & A; w/o, wheezes/ rales/ or rhonchi.no accessory muscle use, no dullness to percussion  CARD:  RRR, no m/r/g  , no peripheral edema, pulses intact, no cyanosis or clubbing.  GI:   Soft & nt; nml bowel sounds; no organomegaly or masses detected.  Musco: Warm bil, no deformities or joint swelling noted.   Neuro: alert, no focal deficits noted.    Skin: Warm, no lesions or rashes         Assessment & Plan:

## 2011-04-04 NOTE — Assessment & Plan Note (Signed)
GERD and esophageal stricture s/p dilatation on 03/20/11 now improved  He does need to start on PPI  Plan Restart PPI  And pepcid qhs

## 2011-04-04 NOTE — Assessment & Plan Note (Signed)
Controlled on regimen.  Plan  cont on current regimen

## 2011-04-04 NOTE — Patient Instructions (Signed)
You need to restart your stomach meds-Reflux med-You have Prevacid and Zegerid go ahead and take these until your Dexilant is approved by your insurance (this was the med Dr. Jarold Motto recommended. ) May take your Pepcid At bedtime   GERD Diet.  Follow med calendar closely and bring to each visit.  Try to make a dentist appointment.  follow up Dr. Sherene Sires  In 6 weeks.

## 2011-04-09 ENCOUNTER — Encounter: Payer: Self-pay | Admitting: Gastroenterology

## 2011-04-09 ENCOUNTER — Ambulatory Visit (INDEPENDENT_AMBULATORY_CARE_PROVIDER_SITE_OTHER): Payer: Medicare Other | Admitting: Gastroenterology

## 2011-04-09 ENCOUNTER — Ambulatory Visit (INDEPENDENT_AMBULATORY_CARE_PROVIDER_SITE_OTHER): Payer: Medicare Other

## 2011-04-09 VITALS — BP 122/74 | HR 64 | Ht 68.0 in | Wt 203.0 lb

## 2011-04-09 DIAGNOSIS — Z860100 Personal history of colon polyps, unspecified: Secondary | ICD-10-CM

## 2011-04-09 DIAGNOSIS — J309 Allergic rhinitis, unspecified: Secondary | ICD-10-CM

## 2011-04-09 DIAGNOSIS — K222 Esophageal obstruction: Secondary | ICD-10-CM

## 2011-04-09 DIAGNOSIS — K219 Gastro-esophageal reflux disease without esophagitis: Secondary | ICD-10-CM

## 2011-04-09 NOTE — Progress Notes (Signed)
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 Biopsy showed no evidence of malignancy or Barrett's mucosa. Currently is asymptomatic on protonic 40 mg a day. Previous colonoscopy by Dr. Randa Evens was remarkable for an adenomatous polyp, and the patient is due for followup in 2016.   Current Medications, Allergies, Past Medical History, Past Surgical History, Family History and Social History were reviewed in [employer] record.   Assessment and plan: No diagnosis found. reflux regime reviewed with patient we will continue daily PPI therapy when necessary dilations as needed. Also will put him in the system for followup colonoscopy as per clinical protocol.           Please copy her primary care physician, referring physician, and pertinent subspecialists.

## 2011-04-11 ENCOUNTER — Encounter: Payer: Self-pay | Admitting: Gastroenterology

## 2011-05-07 NOTE — Assessment & Plan Note (Signed)
[facility]                             PULMONARY OFFICE NOTE   NAME:BAILEYHavard, Radigan                       MRN:          [id number]  DATE:08/18/2007                            DOB:          11-17-1941    HISTORY:  A 70 year old black male with documented non-adherence, severe  asthma.  Comes in with one week of history of increasing cough and  congestion with thick mucus but no purulent sputum, fevers, chills,  sweats, chest pain or leg swelling.  He has been using increased  albuterol rescue therapy  both MDI and nebulizer.   The patient is somewhat dependent on samples for followup but actually  brings back a prescription for Advair that he says is his last one  that has 53 on it, so it clearly is not a sample.  He says he used it  this morning.  He denies any overt sinus or reflux symptoms but note  that he is not any longer on PPI therapy because he can not afford it.   PHYSICAL EXAMINATION:  He is an ambulatory black male with somewhat of a  child-like affect and attitude.  He is afebrile, normal vital signs.  HEENT:  Reveals moderate turbinate edema, oropharynx clear.  NECK:  Supple without cervical adenopathy, tenderness, trachea is  midline, no thyromegaly.  LUNGS:  Fields reveal inspiratory and expiratory sonorous  rhonchi  bilaterally with minimum true wheeze.  Regular rhythm without murmur, gallop, rub.  ABDOMEN:  Soft, benign.  EXTREMITIES:  Warm without calf tenderness, cyanosis, clubbing, edema.   IMPRESSION:  Severe chronic asthma with a best FEV1 of around 2 liters  and documented nonadherence.  He is now having an exacerbation that he  thinks is related to the weather, although I am not convinced that this  is the case as typically we find when we do the math that the amount of  Advair left in his device he has does not add up to daily use of b.i.d.  dosing.   I reviewed with him today this issue and line by line all of his  medications along with optimal use of rescue therapy in the form of  Proventil 2 puffs every 4 hours prn versus the nebulizer, which he could  use interchangeably with the Proventil if he is not at home.   For now I recommended followup in 2 weeks to do the math on his Advair  which should be 28 doses less than 53 which is the present count.  In  the meantime I am going to give him a prescription for prednisone 10 mg  tablets #20 to taper off over 8 days.  If his condition deteriorates and  does not respond to albuterol he is to go to the emergency room.     [name]. Sherene Sires, MD, [facility]  Electronically Signed    MBW/MedQ  DD: 08/18/2007  DT: 08/18/2007  Job #: [id number]

## 2011-05-07 NOTE — Assessment & Plan Note (Signed)
[facility]                             PULMONARY OFFICE NOTE   NAME:BAILEYPastor, Sgro                       MRN:          [id number]  DATE:06/08/2007                            DOB:          12-28-1940    HISTORY OF PRESENT ILLNESS:  The patient is a 70 year old African-  American male, patient of Dr. Thurston Hole who has a known history of chronic  asthma and rhinitis.  He presents, today, for a 41-month follow up. Last  visit patient had had an asthmatic bronchitic exacerbation; and received  a course of antibiotics with doxycycline; and a prednisone taper.  The  patient reports that his symptoms have totally resolved; and he has been  doing exceptionally well.  He denies any wheezing, cough, congestion;  and has not had to use his rescue inhaler.   PAST MEDICAL HISTORY:  Reviewed.   MEDICATIONS:  Reviewed.   PHYSICAL EXAMINATION:  The patient is a pleasant male in no acute  distress.  He is afebrile with stable vital signs.  HEENT:  Unremarkable.  NECK:  Supple without adenopathy.  LUNGS:  Sounds are clear.  CARDIAC:  Regular rate.  ABDOMEN:  Soft and nontender.  EXTREMITIES:  Warm without any edema.   IMPRESSION/PLAN:  1. Recent asthmatic bronchitic exacerbation, now resolved after      antibiotics and steroids.  The patient is well compensated on his      present regimen with Advair and Nasacort.  The patient will follow      back up with Dr. Sherene Sires in one month or sooner if needed.  2. Gastroesophageal reflux.  Currently well compensated on Protonix      daily.      Rubye Oaks, NP  Electronically Signed      Clinton D. Maple Hudson, MD, [facility], FACP  Electronically Signed   TP/MedQ  DD: 06/08/2007  DT: 06/08/2007  Job #: [id number]

## 2011-05-07 NOTE — Assessment & Plan Note (Signed)
[facility]                             PULMONARY OFFICE NOTE   NAME:[name], Lovelady                       MRN:          [id number]  DATE:05/06/2007                            DOB:          October 06, 1941    HISTORY OF PRESENT ILLNESS:  A 70 year old black male with long standing  asthma and documented non-adherence. He was in his usual state of health  until a few days ago, when he developed a hacking cough productive of  green sputum and began using albuterol more than usual (which is zero  according to him. He also has a nebulizer but has not used it. He also  has Mucinex to use 1 to 2 b.i.d. p.r.n. cough and congestion but has not  activated his action plan as far as I could tell from him.   He comes in again without the medication counter that was previously  provided him (which he was asked to bring to each office visit) with  having used his albuterol last about 4 hours ago, with no obvious  increased work of breathing.   However, on physical examination, he is obviously breathing at high lung  volumes with relatively short inspiratory time. He has difficulty with  optimal MDI technique (see comments below). He denies any pleuritic pain  or resting dyspnea, orthopnea, PND, chest pain, leg swelling, fever,  chills, sweats, or sore throat.   PHYSICAL EXAMINATION:  GENERAL:  He is a pleasant ambulatory black male  in no acute distress.  VITAL SIGNS:  Stable.  HEENT:  Unremarkable. Pharynx clear except for minimal erythema. No  obvious cobble-stoning.  NECK:  Supple without cervical adenopathy or tenderness. Trachea is  midline.  LUNGS:  Fields reveals diminished breath sounds bilaterally with distant  wheeze on FBC only.  CARDIOVASCULAR:  Regular rhythm without murmur, rub, or gallop. There  was hyper-inflation to percussion. No increase in P-2.  ABDOMEN:  Soft and benign.  EXTREMITIES:  Warm without calf tenderness, cyanosis, or clubbing.   LABORATORY DATA:  Hemoglobin saturation 93% on room air.   MDI technique was reviewed and his inspiratory time is too short to  effectively inhale MDI.   IMPRESSION:  Acute on chronic asthma associated with discolored sputum  and documented non-adherence. I offered the patient admission but he  says that he has a nebulizer at home and would rather go home and use it  and therefore, I recommended the following:   RECOMMENDATIONS:  1. Implement the previous action plan to activate and use the      albuterol nebulizer up to every 4 hours p.r.n.  2. Prednisone 10 mg tablets, #20, to be tapered off over the next 8      days.  3. Doxycycline 100 mg b.i.d. for the purulent sputum that he is      reporting (although this may actually be eosinophilic and not      neutrophilic purulence discoloration).  4. Followup will be at his previously appointed time, namely 2 weeks.  5. I reiterated as previously instructed that the patient followup  with the maintenance and the p.r.n.'s as they are listed and      produce the sheet that he was given on each office visit, to make      sure we kept up with the paper trail and other recommendations      that were made. This includes to use Mucinex up to 2 b.i.d. p.r.n.      cough and congestion.     [name]. Sherene Sires, MD, [facility]  Electronically Signed    MBW/MedQ  DD: 05/06/2007  DT: 05/06/2007  Job #: [id number]

## 2011-05-07 NOTE — Assessment & Plan Note (Signed)
[facility]                             PULMONARY OFFICE NOTE   NAME:[name], [name]                       MRN:          [id number]  DATE:07/07/2007                            DOB:          1941/07/26    HISTORY:  This is a 70 year old black male never smoker with a  documented severe asthma, with optimum FEV1 of around 2 liters.  Now  with another exacerbation, and comes in with acute worsening of dyspnea  over the last 5 days.  Says he ran out of his Protonix.  He has been  somewhat dependent on [place] for some samples, and brings in bags that are  full of medications that are almost gone (including Nasocort), but has  not been able to keep up with taking Protonix.   PHYSICAL EXAMINATION:  GENERAL:  An ambulatory black male with somewhat  of a childlike affect and attitude.  VITAL SIGNS:  He is afebrile with normal vital signs.  HEENT:  Unremarkable.  Oropharynx is clear. There is no thrush.  NECK:  Supple without cervical adenopathy or tenderness.  Trachea is  midline.  LUNGS:  Lung fields reveal classic pseudo wheeze only.  Distant breath  sounds are present, but there is no true wheeze.  CARDIAC:  Regular rate and rhythm; without murmur, gallop or rub.  ABDOMEN:  Soft and benign.  EXTREMITIES:  Warm and without calf tenderness, cyanosis or clubbing.   IMPRESSION:  Asthma flare in the setting of having run out of Protonix  and with prominent pseudo wheeze.  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   RECOMMENDATIONS:  I am going to recommend restarting Protonix at 40 mg  daily; we have given him 15 days of samples.   FOLLOWUP:  We have asked him to return in one week for full medication  reconciliation.   (He presently has Advair #27).  On the sample he has will see if he has  14 or less doses when he returns, and advised him that we will be  checking on this using a trust but verifying approach, to make sure he  does not  run out of medications.     [name]. Sherene Sires, MD, [facility]  Electronically Signed    MBW/MedQ  DD: 07/07/2007  DT: 07/08/2007  Job #: [id number]

## 2011-05-07 NOTE — Assessment & Plan Note (Signed)
[facility]                             PULMONARY OFFICE NOTE   NAME:[name], [name]                       MRN:          [id number]  DATE:09/09/2007                            DOB:          [birth year]    PULMONARY EXTENDED FOLLOWUP VISIT:   HISTORY:  Patient is a 70 year old black male with difficult to control  asthma with severe chronic asthma with fixed air-flow obstruction  pattern with the best FEV1 around 2 L, seen on August 26 with a flare-up  with a count of Advair of 53 on his inhaler.  He was treated for an  acute exacerbation and then missed his followup office visit.  According  to my math, the count should have been down to 9, but it is sitting at  14 on the inhaler he brought with him, which he says is the same inhaler  he had before, never misses a dose under any circumstances.   However, the good news is that he has had no symptoms (although not very  physically active) of limiting dyspnea, nocturnal exacerbation of cough,  wheeze or need for rescue therapy since his previous visit.  He says he  is completely back to baseline after his recent exacerbation after a  course of prednisone.   For full inventory of medications, please see face sheet, dated  September 09, 2007.  Notice they correlate very nicely with the  medication calendar he carries with him, albeit with multiple edits to  the sheet since it was originally written.   PHYSICAL EXAMINATION:  He is a pleasant, ambulatory black man, in no  acute distress.  Afebrile, normal vital signs.  Blood pressure 146/82.  HEENT:  Unremarkable.  Pharynx clear.  LUNG FIELDS:  Reveal relatively short inspiratory and expiratory time,  but trace wheeze only.  He has a regular rate and rhythm without murmur, gallop or rub.  ABDOMEN:  Soft, benign.  EXTREMITIES:  Warm without calf tenderness, cyanosis, clubbing or edema.   MDI technique was reviewed with the patient again because it has  been  less than optimal and approached 75% with coaching.   IMPRESSION:  Difficult-to-control asthma with severe chronic  irreversible component that has been exacerbated by the fact that he  cannot seem to use his medicines consistently, despite being provided  samples and a calendar and constant reinforcement of the importance to  maintain the medicines just as they are.   I suspect if he is making errors with Advair, he is also making errors  with his other medications and I have done everything possible to  prevent this.   The good news is that the count was not off by 20 or 30, as it has been  in the past, but only by 5.  Also, he reports has not needed any form of  rescue therapy since his previous visit.   I have, therefore, recommended followup every three or four weeks.  On  his next visit, he will need a new medicine calendar, after we trust and  verify that he is actually taking everything he says  he is taking.     [name]. Sherene Sires, MD, [facility]  Electronically Signed    MBW/MedQ  DD: 09/09/2007  DT: 09/09/2007  Job #: [id number]

## 2011-05-07 NOTE — Assessment & Plan Note (Signed)
[facility]                             PULMONARY OFFICE NOTE   NAME:BAILEYChristan, Defranco                       MRN:          [id number]  DATE:07/16/2007                            DOB:          1941-01-24    HISTORY OF PRESENT ILLNESS:  Patient is a 70 year old African American  male patient of Dr. Thurston Hole who has a known history of documented severe  asthma that presents for a 10-day followup.  Patient had recently had a  flare of his asthma/reactive airways due to discontinuation of  medications.  Last visit patient was given samples of Protonix to  restart and also Advair.  Patient had been recommended to bring back his  medications in today for review.  Patient has had much difficulty in the  past keeping up with his medication regimen despite multiple attempts of  assisting him with medication lists and samples.  Unfortunately patient  returns today without any medications or samples to determine if he in  fact is taking his medications on a regular basis.  Patient assures me  that he is when he has samples, it is very difficult to him to afford  medications and Protonix and Advair are very expensive.   PAST MEDICAL HISTORY:  Reviewed.   CURRENT MEDICATIONS:  Reviewed.   PHYSICAL EXAMINATION:  Patient is a pleasant male in no acute distress.  He is afebrile with stable vital signs.  HEENT:  Unremarkable.Marland Kitchen  NECK:  Supple without adenopathy.  No JVD.  LUNGS:  Sounds are clear.  CARDIAC:  Regular rate.  ABDOMEN:  Soft and nontender.  EXTREMITIES:  Warm without any edema.   IMPRESSION AND PLAN:  Asthma with recent flare, now resolved.  Patient  is recommended to remain on his present regimen and we have emphasized  the importance of maintaining his medications and not discontinuing  them.  Patient may also use omeprazole over the counter which would be  less expensive than Protonix if he in fact runs out of Protonix or can  not afford Protonix.   Patient will return back here in 4 weeks with Dr.  Sherene Sires or sooner if needed.     Rubye Oaks, NP  Electronically Signed      [name]. Sherene Sires, MD, [facility]  Electronically Signed   TP/MedQ  DD: 07/21/2007  DT: 07/22/2007  Job #: [id number]

## 2011-05-07 NOTE — Assessment & Plan Note (Signed)
[facility]                             PULMONARY OFFICE NOTE   NAME:[name], [name]                       MRN:          [id number]  DATE:11/05/2007                            DOB:          November 02, 1941    PULMONARY/ACUTE FOLLOWUP OFFICE VISIT   HISTORY:  A 70 year old black male with chronic severe asthma and  documented non-adherence who returns for followup from recent  exacerbation requiring a course of prednisone. He returns with his  medications as requested for medication reconciliation purposes stating  that he is a bit worse over the last three days requiring his nebulizer  for the first time yesterday, which typically he rarely needs p.r.n. MDI  albuterol.   He denies any purulent sputum, fevers, chills, sweats, chest pain or leg  swelling.   For full inventory of medications, please see face sheet dated November 05, 2007. Noting that the patient is out of Nasacort today.   The other complaint that he is one of frequent urination and  incontinence that has worsened since he has used doxazosin. He says his  force of stream is excellent and denies any dysuria or fever.   PHYSICAL EXAMINATION:  He is a stoic ambulatory black male in no acute  distress with somewhat of a child-like affect and attitude. He has  stable vital signs.  HEENT: Is unremarkable. Oropharynx is clear.  LUNGS: Lung fields reveal diminished breath sounds, but no wheeze  (examination was done within two hours of his last albuterol dose).  HEART: Regular rate and rhythm without murmur, gallop or rub.  ABDOMEN: Soft, benign. No palpable organomegaly including no evidence of  bladder enlargement.  EXTREMITIES: Warm without calf tenderness, cyanosis, clubbing or edema.   IMPRESSION:  1. Chronic severe asthma and rhinitis with documented non-adherence.      His Advair count is presently 29. I have arranged to see him back      in two weeks to recheck his Advair count at  that time and advised      him that I would be checking it. I did not give him any samples      today. I did recommend a six day course of prednisone as he is      having a flare and would hope that if we reinforce the optimal use      of Advair, which is b.i.d. and not p.r.n., he regains control of      his airway inflammation more consistently. We do need to however do      a trust but verify visit each time he comes in so that we can      make sure that he actually is keeping up with his medicines more      effectively than he has in the past.  2. Urinary frequency that did not improve on doxazosin and has now      frequency with no decreased force of stream (therefore not likely      overflow incontinence). He is also borderline      hypertensive so I thought doxazosin was  a good choice for him. I am      going to ask him to      stop the doxazosin and refer him to Urology for evaluation and in      the meantime bring him back in two weeks to recheck his blood      pressure and see if alternate therapy is needed.     [name]. Sherene Sires, MD, [facility]  Electronically Signed    MBW/MedQ  DD: 11/05/2007  DT: 11/05/2007  Job #: [id number]

## 2011-05-07 NOTE — Assessment & Plan Note (Signed)
[facility]                             PULMONARY OFFICE NOTE   NAME:[name], [name]                       MRN:          [id number]  DATE:04/23/2007                            DOB:          July 03, 1941    PULMONARY/FOLLOW-UP OFFICE VISIT:  This is a 70 year old black male with  documented nonadherence, who was given a prescription for Advair 500/50  b.i.d. and a calendar provided on his previous visit.  He did not bring  the calendar back, as requested, and brings in an Advair that is half  used up (it should be all used up if he was consistent about using it).  He insists that he has been using it perfectly regularly and comes in  today complaining of increased frequency of urination, especially when  he tries to get up at night but no dysuria or increased thirst.   PHYSICAL EXAMINATION:  GENERAL:  He is a pleasant ambulatory black male  in no acute distress.  VITAL SIGNS:  He is afebrile with normal vital signs with a blood  pressure of 110/60.  HEENT:  Unremarkable.  Oropharynx is clear.  LUNGS:  Diminished breath sounds with trace wheeze and expiration  bilaterally.  HEART:  Regular rhythm without murmur, rub or gallop.  ABDOMEN:  Soft, benign.  There was no palpable increased bladder size on  palpation of the abdomen nor CVA tenderness.  EXTREMITIES:  Warm without calf tenderness, clubbing, cyanosis or edema.   IMPRESSION:  1. Poorly controlled asthma secondary to nonadherence.  I directly      informed the patient that there is no way that the math adds up,      although he insists it does, if one calculates how much Advair he      should have used up versus how much is left in his present inhaler.      I do not think there is anything more we can do to help this      patient in this regard, however, than pointing out simple math      issues like the fact there are 30 days in a month and that twice      daily use should result in 60 puffs  per month needed.  I would like      him to try as much as possible and remember to take the Advair,      whether he thinks he needs it or not.  He reports that he does not      use much albuterol at all, which is reassuring, but he is not      covering up a symptom that needs to be treated more rigorously      and consistently with the Advair.  2. Increasing urinary frequency without any evidence of diabetes      clinically.  This suggests BPH, and I recommended generic Cardura 4      mg 1/2 nightly.  I emphasized that this may make him feel a little      lightheaded when he stands up but that should solve the  problem or      should contact me for further workup, which would include a BMET to      rule out renal insufficiency and diabetes prior to referring him to      urology.  Otherwise, we will see him back here once a month to try      and improve compliance as much as possible, realizing the      limitations, as outlined above.     [name]. Sherene Sires, MD, [facility]  Electronically Signed   MBW/MedQ  DD: 04/23/2007  DT: 04/23/2007  Job #: [id number]

## 2011-05-07 NOTE — Assessment & Plan Note (Signed)
[facility]                             PULMONARY OFFICE NOTE   NAME:BAILEYAntowan, Samford                       MRN:          [id number]  DATE:10/07/2007                            DOB:          [birth year]    HISTORY OF PRESENT ILLNESS:  The patient is a male patient of Dr. Thurston Hole  who has a known history of difficult-to-control severe chronic  obstructive asthma who presents today for an acute office visit.  The  patient complained over the last 2 weeks he has had increased nasal  congestion, drippy nose, and minimally productive cough with clear  sputum.  The patient denies any purulent sputum, fever, chest pain,  orthopnea.  The patient does complain of intermittent wheezing and  increased albuterol use.  The patient was supposed to have brought all  of his medications in today for review.  However, only brought his  medication list.   PAST MEDICAL HISTORY:  Reviewed.   CURRENT MEDICATIONS:  Reviewed.   PHYSICAL EXAM:  The patient is a pleasant male in no acute distress.  He is afebrile with stable vital signs.  O2 saturation is 92% on room  air.  HEENT:  Nasal mucosa slightly pale.  Nontender sinuses.  Conjunctivae  not injected.  Posterior pharynx is clear.  NECK:  Supple without cervical adenopathy.  No JVD.  LUNGS:  The lung fields reveal coarse breath sounds bilaterally with a  few expiratory wheezes.  CARDIAC:  Regular rate.  ABDOMEN:  Soft and nontender.  EXTREMITIES:  Warm without any edema.   IMPRESSION AND PLAN:  1. Acute asthmatic bronchitic flare.  The patient is to begin      prednisone taper over the next week.  Mucinex DM twice daily.  The      patient is recommended to follow back up in the office in 2 to 3      weeks with Dr. Sherene Sires and instructed to bring all of his medications      for review.  2. Complex medication regimen.  The patient's medication list was      adjusted and patient education was given.  Again, the patient  is      aware to bring all of his medicines in at next visit.     Rubye Oaks, NP  Electronically Signed      [name]. Sherene Sires, MD, [facility]  Electronically Signed   TP/MedQ  DD: 10/07/2007  DT: 10/08/2007  Job #: [id number]

## 2011-05-09 ENCOUNTER — Ambulatory Visit (INDEPENDENT_AMBULATORY_CARE_PROVIDER_SITE_OTHER): Payer: Medicare Other

## 2011-05-09 DIAGNOSIS — J309 Allergic rhinitis, unspecified: Secondary | ICD-10-CM

## 2011-05-10 NOTE — Assessment & Plan Note (Signed)
[facility]                               PULMONARY OFFICE NOTE   NAME:[name], [name]                       MRN:          [id number]  DATE:08/19/2006                            DOB:          1941/12/06    HISTORY OF PRESENT ILLNESS:  Patient is a 70 year old African-American male  patient of Dr. Sherene Sires has a known history of severe difficult to control  asthma and a never-smoker who presents for a five-day history of persistent  yellow sputum and wheezing.  He denies any hemoptysis, chest pain,  orthopnea, PND or leg swelling.   PHYSICAL EXAMINATION:  GENERAL:  Patient is a pleasant black male in no  acute distress.  VITAL SIGNS:  normal.  HEENT:  Normocephalic.  NECK:  Supple with no adenopathy.  LUNGS:  Lung sounds reveal some expiratory wheezes.  CARDIAC:  Reveal regular rate and rhythm.  ABDOMEN:  Soft.  EXTREMITIES: Warm without edema.   IMPRESSION:  1. Acute exacerbation of asthmatic bronchitis.  The patient is began on      omnicef for five days Mucinex DM and prednisone taper until the next      week.  2. Patient to return her as scheduled.                                   Rubye Oaks, NP                                [name]. Sherene Sires, MD, [facility]   TP/MedQ  DD:  08/19/2006  DT:  08/20/2006  Job #:  [id number]

## 2011-05-10 NOTE — Assessment & Plan Note (Signed)
[facility]                             PULMONARY OFFICE NOTE   NAME:BAILEYKyvon, Hu                       MRN:          [id number]  DATE:01/29/2007                            DOB:          October 28, 1941    PULMONARY/POST EMERGENCY ROOM FOLLOWUP:   HISTORY:  A 70 year old black male with documented non-adherence was  seen in the emergency room yesterday for a three to five-day history of  increasing dyspnea associated with a fever of 102 that has now resolved.  He is producing white mucus with no pleuritic pain, sinus symptoms,  fevers, chills, sweats, orthopnea, PND, leg swelling, or chest pain.   MEDICATIONS:  Are supposed to consist of:  1. Advair 500/50 b.i.d. but note the counts do not add up.  (He      should have run out long ago of the samples we gave him).  2. He does have a home nebulizer.  3. Nasonex.  4. He was started on prednisone at 50 mg per day yesterday.   PHYSICAL EXAMINATION:  GENERAL:  He is a stoic, ambulatory, black male  in no acute distress, with somewhat of a child-like affect.  VITAL SIGNS:  He is afebrile with normal vital signs.  HEENT:  Unremarkable.  Oropharynx is clear.  LUNGS:  Fields reveal hyper-resonant percussion with diminished breath  sounds with only trace wheeze.  HEART:  Regular rhythm without murmur, gallop, or rub.  ABDOMEN:  Soft, benign.  EXTREMITIES:  Warm without calf tenderness, cyanosis, clubbing.   IMPRESSION:  Asthma exacerbation in the setting of upper respiratory  infection that is probably viral versus atypical (TWAR) in nature.  I  therefore recommended doxycycline 100 mg twice a day for 7 days, taper  prednisone off over 10 days, and follow up here in 3 weeks for a full  medication reconciliation.   I had a long discussion with this patient regarding a trust but verify  approach, also emphasizing to him that it should not be necessary for  him to go to the emergency room since he was  worsening over a three to  five-day period of time during which our office was open and a nurse  practitioner would have been happy to see him to give him exactly the  same medicine he would have received in the emergency room.   Chart review, however, indicated the patient has been nonadherent and  probably ran out of Advair but did not admit it.  I have given him a  total of 2 weeks of Advair today.  His dry powdered inhaler indicates he  has 15 doses left so that would mean that in 3 weeks he should have 1  dose left when he returns for followup to see our nurse practitioner for  a full medication reconciliation.  I have reviewed with the patient how  we will inventory his medications on his next visit to make sure he  understands that he should take the medicine exactly the way it is  prescribed to reduce short-term and long-term morbidity and mortality  from asthma.  I have  never been convinced that I have ever really gotten through to this  patient though and time will tell whether he complies with the above  recommendation or continues to be a no show for followup visits.     [name]. Sherene Sires, MD, [facility]  Electronically Signed    MBW/MedQ  DD: 01/29/2007  DT: 01/29/2007  Job #: [id number]

## 2011-05-10 NOTE — Discharge Summary (Signed)
NAMEMYLEZ, VENABLE                          ACCOUNT NO.:  [id number]   MEDICAL RECORD NO.:  [id number]                   PATIENT TYPE:  INP   LOCATION:  0340                                 FACILITY:  [facility]   PHYSICIAN:  [name]. Sherene Sires, M.D. [facility]           DATE OF BIRTH:  1941-07-24   DATE OF ADMISSION:  07/29/2003  DATE OF DISCHARGE:  08/02/2003                                 DISCHARGE SUMMARY   FINAL DIAGNOSES:  1. 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  2. Hypertension, intermittent.   HISTORY:  Please see dictated H&P.  This patient is a very challenging 70-  year-old black male never smoker with severe asthma with an FEV1 of 2 L  obtained while in the hospital on IV steroids.  He has a tendency to  nonadherence and came to the office for a check-up on August 6 with both  his nasal steroids and his Advair totally empty stating he just ran out.  However, when I gave him a Xopenex treatment in the office he failed to  improve at all and therefore I admitted him to the hospital.   His recent work-up has included an IGE level 324 and we were in the process  of seeing if we could qualify him for Xolair treatment.  On admission to the  hospital he had 10% eosinophils.  I placed him empirically on high dose  Singulair 10 mg b.i.d. and all the usual asthma medicines and he gradually  improved but even after four days of IV steroids his FEV1 was still only 1  L.  On the morning of the 10th he was free of wheeze, however, stating he  felt the best he felt in a long time and requested discharge.  I told him  that although he was out of danger in the short run, in the long run I am  concerned he is not receiving optimal treatment for his asthma and  recommended serial follow-up in the office as well as careful tapering of  prednisone (we might actually use FEV1 in the office to determine whether or  not he can come off prednisone pending  initiation of Xolair).   Studies also done this hospitalization included a minimally abnormal sinus  CT scan, a BNP of less than 10, normal CBC and chemistry profile, and a  chest x-ray that suggested a small mediastinum, but on repeat was normal.   At the time of discharge, therefore, this patient is ambulatory with minimal  wheeze and will be therefore discharged on the following medications.   DISCHARGE MEDICATIONS:  1. Advair 500/50 one b.i.d.  2. Nasonex two puffs q.12h.  3. Prilosec or Protonix one b.i.d. before meals.  4. Singulair 10 mg b.i.d.  5. Prednisone 10 mg tablets to be tapered down over the next seven days and     then seen in the  office with spirometry to consider weaning completely     versus maintaining on a low dose pending Xolair initiation.  For wheeze     he has a nebulizer with albuterol to use q.4h.   I have also trained him on the use of flutter valve and asked him to come to  the office with all medications and a sheet of instructions so that we can  consolidate his care, make sure he keeps up with his prescriptions and his  refills,  as well as samples.  To be sure he did not run out of refills I gave him a  year's supply of everything except the albuterol today.   He is to maintain a low salt diet and will monitor his blood pressure also  in the office on prednisone.                                               [name]. Sherene Sires, M.D. [facility]    MBW/MEDQ  D:  08/02/2003  T:  08/02/2003  Job:  [id number]

## 2011-05-10 NOTE — H&P (Signed)
[facility]  Patient:    [name], [name]                       MRN: [id number] Adm. Date:  07/27/00 Attending:  [name]. Sherene Sires, M.D. [facility] CC:         Smitty Cords. Beverely Pace, M.D.                         History and Physical  CHIEF COMPLAINT:  Dyspnea.  HISTORY:  This is a 70 year old black male, never smoker, seen by me remotely for asthma.  He has been under the care of a doctor in [facility] and states he has been "fine."  The ER doctor was able to determine that he actually had another asthma flare-up within the last three months, requiring evaluation by the doctor in [facility], but the chronic regimen the patient states that he has been "fine" on, does not include inhaled steroids.  He instead takes a combination of Singulair 10 mg per day, Serevent two puffs b.i.d., and Claritin daily.  He denies "any problems until a week ago" when he got a "cold."  He noticed worsening chest congestion with sputum that was mucoid in nature, had progressive dyspnea over the last three days with increased need for rescue therapy with Proventil.  He came to the ER, and it was felt that being status asthmaticus and did not clear with the bronchodilator treatment and he was referred to me by Dr. Beverely Pace for admission.  The patient denies any sinus or reflux complaints.  He denies any purulent sputum, chest pain, fever, chills, _________ , orthopnea, PND, _________ , or abdominal complaints.  PAST MEDICAL HISTORY:  Essentially this patient states he has been healthy.  I do not have any of his old records from previous [facility] admissions.  ALLERGIES:  None known.  MEDICATIONS:  As above, otherwise no medications.  SOCIAL HISTORY:  He denies ever smoking.  He is independent in terms of ADLs and still working regularly.  FAMILY HISTORY:  Positive for asthma in his sisters.  Positive for heart disease in his mother who lived to be in her 89s.  REVIEW OF SYSTEMS:   Taken in detail and essentially negative except as noted above.  PHYSICAL EXAMINATION:  GENERAL:  This is a somewhat depressed appearing black male, who really does not give much spontaneous history without being prompted.  VITAL SIGNS:  He is afebrile.  His blood pressure was initially 197/136, normal vital signs, except for a respiratory rate of 36.  HEENT:  Unremarkable.  Oropharynx is clear.  NECK:  Supple without cervical adenopathy.  Trachea is midline.  LUNGS:  He has mild increased work of breathing sitting bolt upright on a stretcher with accessory muscle use.  Hyperresonant to percussion with pan expiratory wheezes bilaterally.  There are no crackles on inspiration.  HEART:  There is regular rate and rhythm without murmurs, _________ .  ABDOMEN:  Soft, benign.  Positive Hoovers sign and inspiration and accessory muscle use as noted above.  Abdomen was otherwise benign.  EXTREMITIES:  Warm without calf tenderness, cyanosis, clubbing, or edema.  NEUROLOGIC:  No focal deficits, _________ light reflexes, although his affect and mood appeared depressed to me.  SKIN:  Warm and dry.  LABORATORY:  Chest x-ray revealed increased bronchial marking internally.  Repeat blood pressure revealed 150/109.  IMPRESSION: 1. Status asthmaticus in a patient with chronic asthma with  probable frequent    flare-ups, although I believe he tends to minimize his symptoms for reasons    that are not clear (I will need to check the old records from the office to    see if we had this problem previously in managing his disease).  However,    he clearly has not responded to emergency room treatment, and therefore, he    needs to be hospitalized for continued IV steroids, magnesium sulfate    infusion, and empiric bronchodilators. 2. Evidence of bronchitis clinically but no real purulent sputum or evidence    of focal infiltrates.  Will simply treat him with Zithromax empirically,     therefore. 3. Marked hypertension in response to respiratory distress.  Will treat him    with clonidine short term and follow him once his asthma is resolved to see    if he needs long term therapy. DD:  07/27/00 TD:  07/27/00 Job: [id number]

## 2011-05-10 NOTE — Discharge Summary (Signed)
[facility]  Patient:    [name], [name]                       MRN: [id number] Adm. Date:  02/05/01 Disc. Date: 02/07/01 Attending:  [name]. Sherene Sires, M.D. [facility]                           Discharge Summary  DISCHARGE DIAGNOSES: 1. Status asthmaticus rapidly responding to inpatient therapy suggesting the    possibility of nonadherence. 2. Hypertension.  HISTORY OF PRESENT ILLNESS:  Please see dictated H&P from February 05, 2001. This is a 70 year old, African-American, never a smoker with difficult to control asthma with at best FEV-1 of 1.29 requiring high-dose inhaled steroids and empirically placed on Singular as well as PPI therapy with questionable response.  HOSPITAL COURSE:  He was admitted with status asthmaticus despite what I thought was maximal outpatient therapy and dramatically improved as an inpatient.  His repeat FEV-1 was measured at 2.20 which was an all time high and again suggested the possibility of nonadherence.  The patient was up and ambulatory prior to discharge with no active wheezing. Therefore, he is being discharged on a medication calendar, which we generated for him, which presents his medications in an extremely user friendly format if he will just follow it and keep up with his refills.  DISCHARGE MEDICATIONS: 1. Prednisone taper. 2. Norvasc 5 mg per day. 3. Protonix 40 mg b.i.d. 4. Singular 10 mg q.d. 5. Pulmicort four puffs b.i.d. 6. Clonidine 0.1 mg b.i.d.  CONDITION ON DISCHARGE:  Markedly improved with no active wheezing and ambulating on room air without difficulty.  DIET:  Low salt diet.  FOLLOWUP:  We will see him back in the office in one week. DD:  02/21/01 TD:  02/23/01 Job: [id number]

## 2011-05-10 NOTE — Assessment & Plan Note (Signed)
[facility]                               PULMONARY OFFICE NOTE   NAME:[name], [name]                       MRN:          [id number]  DATE:09/12/2006                            DOB:          January 29, 1941    A 70 year old black male, never smoker, with history of chronic asthma.  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  He was presently on Advair 500/50 b.i.d.  plus Nasonex, baby aspirin, and allergy vaccinations which have continued  since December 2006 but has not seen significant improvement to date.   He continues to need albuterol on the average of twice daily but denies any  nocturnal awakening, fevers, chills, sweats orthopnea, PND.   PHYSICAL EXAMINATION:  GENERAL:  He is a minimally cushingoid appearing  ambulatory black male in acute distress, diminished breath sounds.  HEENT: Unremarkable.  LUNGS:  Fields reveal diminished breath sounds, trace wheeze on FVC maneuver  bilaterally.  HEART:  Regular rhythm without murmur, gallop, or rub.  ABDOMEN: Soft, benign.  EXTREMITIES: Warm without calf tenderness.  No cyanosis, clubbing, or edema.  VITAL SIGNS: Oxygen saturation 95% on room air.   IMPRESSION:  Severe chronic asthma with an irreversible component and also  documented nonadherence in the past.  I believe he is doing better since we  are basically providing all of his Advair samples, but I did emphasize to  him that there should be full accountability.  Today he had a count of 16  left and provided for another 4 weeks of therapy, so should have 2 left by  the time he returns back in 5 weeks.   I reviewed with him optimal BDI and MDI technique today and also informed  him that he may be a candidate for a Xolair trial.  We will see him back in  5 weeks and see if we can notify the clinical research manager in the  meantime that he is a potential candidate for the Xolair  trial.                                   Casimiro Needle B. Sherene Sires, MD, [facility]   MBW/MedQ  DD:  09/12/2006  DT:  09/15/2006  Job #:  [id number]

## 2011-05-10 NOTE — Assessment & Plan Note (Signed)
[facility]                               PULMONARY OFFICE NOTE   NAME:[name], [name]                       MRN:          [id number]  DATE:11/06/2006                            DOB:          September 02, 1941    PULMONARY/ALLERGY FOLLOWUP.   PROBLEMS:  1. Chronic asthma/chronic obstructive pulmonary disease.  2. Allergic rhinitis.   HISTORY:  He continues to follow with Dr. Sherene Jacobson for pulmonary management,  with less than optimal compliance with recommended therapy.  He has been on  allergy vaccine at 1:50, which he has tolerated well and he seems to have  been compliant at least with his vaccine dosing.  Dr. Sherene Jacobson is evaluating for  Xolair, and comments that metered inhaler technique is good.  RAST testing  was repeated, showing total IgE of 234.  Elevated specific IgE's were very  similar to the skin test profile, and to his vaccine content, most  particularly for dust mite, endodermals, grass and tree pollens.   MEDICATIONS:  1. Nasonex 2 puffs b.i.d.  2. Advair 500/50.  3. Aspirin 81 mg.  4. Allergy vaccine.  5. Albuterol rescue inhaler.   OBJECTIVE:  Weight 197 pounds, BP 112/78, temperature 97 degrees, pulse  regular 68, room air saturation 93%.  He says he feels well, and denies problems with his vaccine.  On exam I note periorbital edema and shiners without conjunctival injection  or significant nasal congestion.  There is wheeze at the right scapula, but he does not feel it.  Lung sounds  are otherwise quiet.  Breathing is unlabored, without cough. Heart sounds  are regular, without murmur.   IMPRESSION:  1. Allergic rhinitis.  2. Asthma with chronic obstructive pulmonary disease, significant allergic      component.   PLAN:  1. Appropriate candidate to go forward with the Xolair discussion per Dr.      Sherene Jacobson.  2. We are advancing his allergy vaccine to 1:10 with the next order.  3. I have again reviewed and given information on  environmental      precautions, especially      addressing house dust.  4. Schedule return to me in one year for vaccine followup, but earlier      p.r.n., and he will follow with Dr. Sherene Jacobson for his pulmonary management      otherwise.     Hector D. Maple Hudson, MD, [name], FACP  Electronically Signed    CDY/MedQ  DD: 11/06/2006  DT: 11/07/2006  Job #: [id number]

## 2011-05-10 NOTE — H&P (Signed)
[facility]  Patient:    [name], [name]                       MRN: [id number] Adm. Date:  02/05/01 Attending:  [name]. Sherene Sires, M.D. [facility]                         History and Physical  CHIEF COMPLAINT:  Dyspnea.  HISTORY OF PRESENT ILLNESS:  This is a 70 year old black male, never smoker, with difficult to control chronic asthma, with best FEV1 in the last six months of 1.29, which was still only 40% predicted.  He came to the office today with an exacerbation over the last two weeks, failing to respond to an outpatient regimen that he actually could not articulate back to me even when sitting with a medication calendar which expressed his medications in an extremely user friendly format.  I suspect that this patient is partially functionally illiterate, but also I suspect that he is not following any of the instructions that he was given.  Because he had pan expiratory wheezing on exam, despite an aggressive outpatient regimen, I elected to admit him at this time not feeling comfortable continuing to treat him as an outpatient without being sure that he could take his medications.  The patient describes generalized upper chest and "throat tightness" which is present 24 hours a day.  Although he has been having increasing dyspnea, he did not remember to take his rescue therapy.  He has been maintained on both high dose Protonix and maximum dose Pulmicort since his last visit with no improvement in dyspnea with minimal activity.  He also has had a "feeling of electricity" shooting through his body that does not wake him from his sleep, but is present soon after getting up, and has worsened over the last month with cramps in his muscles as well.  He denies any purulent sputum production, sinus complaints, fevers, chills, sweats, orthopnea, paroxysmal nocturnal dyspnea, overt reflux symptoms.  Note that he had a previous sinus CT scan and chest x-ray  showing no significant abnormalities during admission in August 2001.  PAST MEDICAL HISTORY:  Hypertension.  ALLERGIES:  No known drug allergies.  MEDICATIONS: 1. He is supposed to be on Norvasc 5 mg q.a.m. 2. Protonix 40 mg b.i.d. 3. Singular 100 mg q.d. 4. Pulmicort 4 puffs b.i.d. 5. Klonodine 0.1 mg b.i.d.  SOCIAL HISTORY:  He denies ever smoking.  He is independent in terms of activities of daily living and still working regularly.  FAMILY HISTORY:  Positive for asthma in his sisters.  Positive for heart disease in his mother who lived to be in her 70s.  REVIEW OF SYSTEMS:  Taken in detail, essentially negative except as noted above.  There has been no unexplained weight loss, fevers, chills, exertional chest pain, change in bowel or bladder habits, or dysphagia.  PHYSICAL EXAMINATION:  GENERAL:  He is an anxious black male with an unusual affect in no acute distress.  VITAL SIGNS:  He is afebrile.  HEENT:  Unremarkable.  Oropharynx is clear with no postnasal drainage or cobblestoning.  NECK:  Supple without cervical adenopathy or tenderness.  Trachea midline.  No thyromegaly.  CHEST:  Hyperresonant in percussion with pan expiratory wheezing on exam and extremely poor air movement.  HEART:  Regular rate and rhythm without murmurs, rubs, or gallops present.  ABDOMEN:  Soft and benign.  EXTREMITIES:  Warm without calf  tenderness.  No clubbing, cyanosis, or edema.  NEUROLOGIC:  No focal deficits.  Pathologic reflexes.  SKIN:  Warm and dry with no lesions.  LABORATORY DATA:  FEV1 is down to 0.75, which is 23% predicted.  Hemoglobin saturation was 94% on room air.  IMPRESSION: 1. Severe chronic asthma, worsening despite maximal medical therapy. At this    point I am not sure whether the patient actually takes his medications as    prescribed.  Even though he had his medications in an extremely user    friendly format he was not able to tell me exactly how he  took his    medications even with the medication sheet sitting in front of him.  I    suspect this patient is functionally impaired in terms of understanding    written instructions, and I think he is very high risk of asthma death as a    result.  I therefore recommend admitting him to the hospital for IV    steroids and around-the-clock bronchodilators to establish a "best FEV1."    I will also increase his Pulmicort up to 8 puffs b.i.d. and continue him on    Singular.  Magnesium sulfate infusion may be necessary to control his    bronchospasm. 2. Hypertension.  He is well controlled on his present regimen which I did not    change. 3. Sensation of "electricity shooting through his body" and muscle spasms are    a bit unusual.  We will check a magnesium level and CMET, and consider a    neurology evaluation, although I suspect that this is a functional    complaint. DD:  02/05/01 TD:  02/06/01 Job: [id number]

## 2011-05-10 NOTE — Assessment & Plan Note (Signed)
[facility]                               PULMONARY OFFICE NOTE   NAME:[name], [name]                       MRN:          [id number]  DATE:07/16/2006                            DOB:          Jun 05, 1941    A 70 year old black male with severe, difficult-to-control asthma and  documented nonadherence, who comes in yet again today with what should have  been exactly four weeks' worth of Advair samples given to him with a counter  that doesn't add up to consistent adherence.  He is off by at least a week  in terms of the extra amount of medicine that was available, after being  almost out of his Advair on his last visit over four weeks ago.   He states he has been having no symptoms and therefore cannot remember to  use his inhaler.  On the other hand, he is also not using albuterol rescue.  He denies any nocturnal symptoms or limiting dyspnea, albeit a very  sedentary lifestyle.   PHYSICAL EXAMINATION:  GENERAL:  He is a pleasant, ambulatory black male in  no acute distress with somewhat of a passive affect and attitude.  VITAL SIGNS:  __________ vital signs.  HEENT:  Unremarkable.  Sclerae clear.  LUNGS:  Lung fields reveal diminished breath sounds and expiratory wheeze.  There is a regular rate and rhythm and moderate increased expiratory time.  CARDIAC:  Regular rhythm without murmur, gallop or rub.  ABDOMEN:  Soft and nondistended.  EXTREMITIES:  No calf tenderness.  No cyanosis, clubbing, or edema.   IMPRESSION:  Severe chronic asthma secondary to documented nonadherence.  He  had actually improved on his last visit but now is worsening again in terms  of adherence and is actively wheezing in the office.  I am not sure we can  solve this problem by changing medicines, but I thought it was worth trying  the new Symbicort 160/4.5 two puffs b.i.d. to try to get his attention in  terms of what an inhaler can do if he takes it consistently, the  feeling  being that because Symbicort has a full beta agonist, he may perceive enough  of a benefit to where he actually is more consistent about taking the  medicine.  On the other hand, this patient may suffer from a lack of  perception of dyspnea which, if true, will not benefit from the Symbicort.  Therefore, I have told him if he does have any problems with the Symbicort  transition to go back to using the Advair.   I have given him samples for exactly four weeks, and we will see him back in  four weeks with spirometry, sooner if needed.                                   [name]. Sherene Sires, MD, [facility]   MBW/MedQ  DD:  07/16/2006  DT:  07/16/2006  Job #:  [id number]

## 2011-05-10 NOTE — Assessment & Plan Note (Signed)
[facility]                               PULMONARY OFFICE NOTE   NAME:[name], [name]                       MRN:          [id number]  DATE:10/03/2006                            DOB:          [birth year]    HISTORY OF PRESENT ILLNESS:  The patient is a 70 year old African-American  patient of Dr. Thurston Hole who has a known history of severe asthma.  Returns for  a 2 week follow up to review medications.  The patient is maintained on  Advair 500/50 twice daily along with Nasonex twice daily and allergy  vaccines twice weekly.  Patient returned today with his Nasonex and Advair  samples that he was given last visit.  However, reports he has been taking  it on a regular basis.  Patient denies any rescue albuterol use since last  visit.  Patient has agreed to determine if he is a candidate for Xolair  therapy.  Patient has had a previous elevated IgE and positive RAST test in  the past with IgE at 321.  We will recheck that today and determine if  patient qualifies for Xolair.  Patient had previously been recommended to  start on Xolair in 2004; however, was unable to do it due to financial  reasons.   PAST MEDICAL HISTORY:  Reviewed.   CURRENT MEDICATIONS:  Reviewed.   PHYSICAL EXAM:  Patient is a pleasant black male in no acute distress, he is  afebrile with stable vital signs.  The O2 saturation is 93% on room air.  HEENT:  Unremarkable.  NECK:  Supple without adenopathy or jugular venous distention.  LUNGS:  Clear without any wheezing or crackles.  CARDIAC:  Regular rate.  ABDOMEN:  Soft.  UPPER EXTREMITIES:  Warm without any edema.   IMPRESSION AND PLAN:  1. Poorly controlled asthma felt mainly due to patient's nonadherence with      medications.  Patient again was given additional samples of Advair and      Nasonex in hopes that this will help with his compliance rate.  Patient      will recheck here in one month or sooner if needed with Dr.  Sherene Sires.  An      IgE and RAST test is pending at time of dictation.  If patient      continues to be positive, will start the Xolair qualification process.  2. Complex medication regimen.  The patient's medications are reviewed in      detail.  Patient      education is provided.  A computerized medication calendar was adjusted      and reviewed again with patient.  Patient is aware to bring this back      to each visit.      Rubye Oaks, NP  Electronically Signed      Clinton D. Maple Hudson, MD, [facility], FACP  Electronically Signed   TP/MedQ  DD: 11/03/2006  DT: 11/03/2006  Job #: [id number]

## 2011-05-10 NOTE — Discharge Summary (Signed)
[facility]  Patient:    [name], [name]                       MRN: [id number] Adm. Date:  [id number] Disc. Date: [id number] Attending:  Avie Echevaria Dictator:   Earley Favor, RN, MSN, ACNP                           Discharge Summary  DATE OF BIRTH:  [birth year]  DISCHARGE DIAGNOSES: 1. Acute exacerbation as asthma with status asthmaticus. 2. Hypertension. 3. Questionable left lower lobe pneumonia.  PROCEDURE:  CT scan of the sinuses with negative results.  HISTORY OF PRESENT ILLNESS:  [name] is a 70 year old black male who never smoked with known asthma.  He presented at [facility] with progressive dyspnea along with congestion and wheezing.  He proved refractory to treatment in the emergency department and was admitted to [facility] under the care of Dr. Sherene Sires, Pulmonary Division, [facility], whom he has seen in the past.  LABORATORY DATA:  Sodium 146, potassium 4.2, chloride 106, CO2 34, BUN 7, creatinine 1.2, glucose 135, calcium 9.8, total protein 8.1, albumin 4.1, AST 31, ALT 28, ALP 78, total bilirubin 0.9.  Hemoglobin 16.6, hematocrit 51.1, platelets 343, WBC 8.0.  Chest x-ray shows bronchitic changes with opacity, questionable infiltrate left lower lung which increased during hospitalization.  CT of the sinuses showed no sinusitis.  HOSPITAL COURSE:  #1 - ACUTE EXACERBATION OF ASTHMA WITH STATUS ASTHMATICUS:  [name] was admitted to [facility] with dyspnea, chest congestion, wheezing, and general malaise.  His peak flows were noted to be below 100.  He as treated with IV steroids, IV antibiotics, and bronchodilators.  He initially did not improve with treatment. Chest x-ray showed progressive infiltrate in the left lower lobe, and his IV antibiotics were changed to more broad-spectrum from Zithromax to Tequin IV to deal with community-acquired pneumonia.   His pulmonary status did improve with peak flows increasing to the 250 to 300 range.  He reached maximal hospital benefit on August 04, 2000, and will be discharged home to follow up with Dr. Sherene Sires in 10 days.  #2 - HYPERTENSION:  He was noted to be hypertensive while in the hospital at this time.  Blood pressure reached a peak of 176/88, and he was treated with Clonidine 0.1 mg b.i.d.  Blood pressure decreased to 158/86 by day of discharge.  He will be followed up on an outpatient basis for his hypertension by Dr. Sherene Sires.  #3 - SUSPECTED LEFT LOWER LOBE PNEUMONIA:  As stated before, chest x-ray showed serial increase in left lung base opacity.  His IV antibiotics were changed to Tequin, and he did improve with antimicrobial change.  No microscopic data was available to classify the suspected infection.  DISCHARGE MEDICATIONS: 1. Advair 550 one puff 2 times a day. 2. Clonidine 0.1 mg 1 tablet b.i.d. 3. Prednisone taper 40 mg for 4 days, 30 mg for 4 days, 20 mg for 4 days,    10 mg for 4 days, then stop. 4. Singulair 10 mg 1 with supper. 5. Albuterol MDI 2 puffs q.4h. if needed. 6. Protonix 40 mg 1 q.d. 7. Tequin 400 mg p.o. for the next 5 days.  DIET:  Low salt.  FOLLOWUP:  Scheduled with Dr. Sherene Sires on August 15, 2000, at 4 p.m.  DISPOSITION/CONDITION ON DISCHARGE:  His  pulmonary status has improved.  He is discharged home to follow up with Dr. Sherene Sires in 10 days. DD:  08/04/00 TD:  08/05/00 Job: [id number]

## 2011-05-10 NOTE — Assessment & Plan Note (Signed)
 [facility]                             PULMONARY OFFICE NOTE   NAME:[name], [name]                       MRN:          [id number]  DATE:03/09/2007                            DOB:          Sep 18, 1941    A 70 year old black male with documented non adherence comes in today  having run out of Advair.  We have never had a consistent count when  calculating how many doses he needs between visits (I have offered to  see him back every 2 weeks for this purpose).  Note that he also comes  in every Friday for allergy shots.   For full information on medication, please see patient sheet, column  dated March 09, 2007, although I am strongly suspicious that he does not  take any of the medications the way they are listed.   PHYSICAL EXAMINATION:  Ambulatory black male with somewhat of a child-  like affect and attitude.  He is afebrile with normal vital signs.  HEENT:  Unremarkable.  Lung fields revealed diminished breath sounds with trace end expiratory  wheeze.  Regular rate and rhythm without murmurs, gallops, or rubs.  ABDOMEN:  Obese, benign.  EXTREMITIES:  Warm without calf tenderness, cyanosis, clubbing, or  edema.   Hemoglobin saturation 98% on room air.   IMPRESSION:  Severe, difficult-to-control asthma in this never smoker  with documented non adherence with the best FEV1 of 2.2 listed in  October of 2002, and tendency to frequent exacerbations requiring  allergy vaccinations.  I suspect, also, that he has reflux that is not  well controlled based on his body habitus, and recommended Protonix.  I  gave him 15 days of Protonix, 2 weeks worth of Advair 500/50 b.i.d., and  2 canisters of Nasacort, and asked him to return in 2 weeks for full  medication reconciliation, using a trust but verify approach.  I told  the patient point blank he was taking a high risk, given the natural  history of severe asthma in terms of morbidity and mortality, but  he  seemed to brush this off with an affable attitude that indicates to me  relatively poor insight, and that he will be very unlikely to comply  with these recommendations.  The best we can do is move the mountain to  [facility] by asking him to see our nurse practitioner every 2 weeks (he  is here on Friday, anyway, for allergy vaccinations, so it would better  if he saw our nurse practitioner and we did a full medication  reconciliation, then go haphazardly with the samples that are available  at the time of his allergy vaccination visits).  He agreed to this  approach, at least, for now.  We will see if he complies with it.     [name]. Sherene Sires, MD, [facility]  Electronically Signed    MBW/MedQ  DD: 03/09/2007  DT: 03/10/2007  Job #: [id number]

## 2011-05-10 NOTE — Assessment & Plan Note (Signed)
[facility]                             PULMONARY OFFICE NOTE   NAME:[name], [name]                       MRN:          [id number]  DATE:02/20/2007                            DOB:          [birth year]    HISTORY OF PRESENT ILLNESS:  The patient is a 70 year old African  American male patient of Dr. Thurston Hole who has a known history of asthmatic  bronchitis that presents for a 2-week followup.  Last visit, patient was  having a flare, and was treated with a prednisone taper and doxycycline.  Patient states his symptoms are improved.  Patient was also given  patient education on his medications, and advised to bring them in  today.  Patient was only supposed to have 1 dose of Advair left with the  sample pack that Dr. Sherene Sires gave him.  However, patient reports he did  have 1 leftover Advair disk at home, and now with his disk today, has 12  doses left.  Patient also was asked to show me his albuterol inhaler,  and he gives me his old Symbicort inhaler he had.  He reports he is out  of his albuterol.  Patient also is out of his aspirin, which he did not  bring today.  Instead of Nasonex, has a sample of Nasacort AQ.  Patient  denies any chest pain, shortness of breath, orthopnea, PND.  Does  complain he has been having heartburn symptoms.   PAST MEDICAL HISTORY:  Reviewed.   CURRENT MEDICATIONS:  Reviewed.   PHYSICAL EXAMINATION:  Patient is a pleasant male, in no acute distress.  He is afebrile with stable vital signs.  HEENT:  Unremarkable.  NECK:  Supple without adenopathy.  LUNG SOUNDS:  Reveal a few scattered expiratory wheezes.  CARDIAC:  Regular rate and rhythm.  ABDOMEN:  Soft and non-tender.  No hepatosplenomegaly.  Bowel sounds are  positive throughout all 4 quadrants.  EXTREMITIES:  Warm without edema.   IMPRESSION AND PLAN:  1. Recent asthmatic exacerbation.  Patient is improved after      antibiotics and steroid taper.  Patient is advised  to use Advair      twice daily, and continue on his Nasacort AQ 2 puffs twice daily.  2. Complex medication regimen.  Patient's medications were reviewed in      detail.  Patient education was provided.  Patient was given no      Advair samples today.  Advised to return here in 2 weeks for      followup with Dr. Sherene Sires.  Patient has 1 Advair disk today that has      12 doses left, which will last him 6 days, and therefore, on his      return, his new counter would be able to be counted down after 6      doses subtracted.  Patient's computerized medication calendar was      adjusted accordingly, and reviewed with patient.  3. Gastroesophageal reflux.  Patient was started on Protonix daily      today and given antireflux preventative measures.  Rubye Oaks, NP  Electronically Signed      [name]. Sherene Sires, MD, [facility]  Electronically Signed   TP/MedQ  DD: 02/20/2007  DT: 02/20/2007  Job #: [id number]

## 2011-05-10 NOTE — H&P (Signed)
NAMENORAH, FICK                          ACCOUNT NO.:  [id number]   MEDICAL RECORD NO.:  [id number]                   PATIENT TYPE:  INP   LOCATION:  0340                                 FACILITY:  [facility]   PHYSICIAN:  [name]. Sherene Sires, M.D. [facility]           DATE OF BIRTH:  September 16, 1941   DATE OF ADMISSION:  07/29/2003  DATE OF DISCHARGE:                                HISTORY & PHYSICAL   CHIEF COMPLAINT:  Dyspnea.   HISTORY OF PRESENT ILLNESS:  This is a very challenging 70 year old black  male, nonsmoker, with severe asthma with a best FEV1 of only 2 L.  There  has been a consistent question about compliance as an outpatient and recent  evaluation for consideration for Xolair did reveal an IgE level of 324 but  he has not returned for followup to consider specific allergy testing.  He  came to the office today having run out of both Nasarel and Advair just  this morning which happened to coincide to the day he was scheduled for  followup anyway and, therefore, did not call for refills.   He complains of dyspnea with generalized chest pain and subjective wheezing  that has actually worsened over the last several days (he reports this  actually happened before he ran out of medicines, although I am skeptical  since his bottles are completely empty) and complains of a mild congestive  cough with no purulent sputum, no sore throat, sinus complaints, overt  reflux symptoms, orthopnea, PND, or leg swelling.  Although he has a home  nebulizer as well as albuterol, for some reason he did not feel he needed it  this morning and came to the office with dyspnea at rest.  He was given a  Xopenex treatment in the office 1.25 mg, failed to clear, was quite tight  with pan expiratory wheeze on examination and I elected to admit him to the  hospital to gain better control of his airway disease.   PAST MEDICAL HISTORY:  Intermittent hypertension.   ALLERGIES:  None known.   MEDICATIONS:  1.  He is supposed to be taking Protonix 40 mg b.i.d.  2. Nasonex daily.  3. Advair 500/50 b.i.d.   SOCIAL HISTORY:  He denies ever smoking.  He has been independent in terms  of activities.   FAMILY HISTORY:  Positive for asthma in his sister.  Positive for heart  disease in mother who lived to be in her 33s.   REVIEW OF SYSTEMS:  Taken in detail and essentially negative except as noted  above.   PHYSICAL EXAMINATION:  GENERAL:  This is an alert black male with an unusual  passive affect who appears short of breath at rest with accessory muscle  use sitting upright.  HEENT:  Oropharynx is clear.  Dentition is intact.  Nasal turbinates normal.  Ear canals clear bilaterally.  NECK:  Supple without cervical adenopathy or tenderness.  LUNGS:  Chest is barrel shaped with pan expiratory wheeze.  This improves  only minimally after Xopenex.  CARDIAC:  There is regular rate and rhythm without murmur, rub, or gallop  present.  ABDOMEN:  Soft, benign with positive Hoover's sign with no palpable  organomegaly, mass, or tenderness.  Negative bruits.  EXTREMITIES:  Warm without calf tenderness, clubbing, cyanosis, edema.  NEUROLOGICAL:  No focal deficits.  Pathologic reflexes.  SKIN:  Warm and dry.   LABORATORY DATA:  O2 saturation 92% on room air.   IMPRESSION:  Refractory asthma in a patient with questionable compliance and  apparently a very poor perception of dyspnea.  I believe he is extremely  high risk, therefore, for status asthmaticus resulting in significant risk  of morbidity and mortality from asthma.  Despite my efforts, I have not been  able to get this patient to take seriously the issue of maintaining on his  outpatient medicines as evidenced by the fact that he showed up in the  office today with totally empty inhalers.  Also note that the patient,  although he has a new therapy as well as albuterol did not know he needed  rescue therapy today and has been reluctant to learn  how to use a peak flow  meter.   A. therefore, elected admission to the hospital.  We need to control his     asthma with high dose steroids and around the clock nebulizers as well as     magnesium sulfate infusion acutely.  Will treat him with high dose PPI     therapy empirically and try to see if he is a candidate for Xolair     therapy based on his recent marked elevation of IgE.                                               [name]. Sherene Sires, M.D. [facility]    MBW/MEDQ  D:  07/29/2003  T:  07/29/2003  Job:  [id number]

## 2011-05-10 NOTE — Assessment & Plan Note (Signed)
HEALTHCARE                             PULMONARY OFFICE NOTE   NAME:[name]                       MRN:          [id number]  DATE:03/24/2007                            DOB:          Aug 09, 1941    This is a 70 year old African-American male patient of Dr. Sherene Jacobson who has  a known history of severe difficult-to-control asthma with frequent  exacerbations.  The patient presents today to review medications.  We  have been having difficulty with the patient following medication  directions and taking his medications on a regular basis.  Over the last  several weeks, the patient has been coming in to review his medications.  On each visit, he had been given samples.  However, on return visit, it  is obvious that the patient is not taking his medications as  recommended.  The patient was given 1 sample of Advair last visit, which  should have been completely down to 0 at today's visit.  However, the  patient has 4 doses left.  The patient also was given Protonix, which he  does not have at today's visit, reporting that he ran out last night.  I  once again encouraged the patient and advised him of the dangers of not  taking his medications properly.  The patient assures me that he can  afford his medications through his Medicare insurance, and therefore I  have given him prescriptions for his Advair, Nasacort, and Protonix  prescriptions, which he assures me he can afford and will get those  prescriptions.  The patient also has brought in a Xopenex nebulizer  instead of an albuterol nebulizer medicine, which he had on hand to use  on an as needed basis.  In fact, the Xopenex medication was expired and  had another person's name on it, which he states it was his friend's.  The patient denies any wheezing, shortness of breath, cough, or  nocturnal awakening since last visit.   PAST MEDICAL HISTORY:  Reviewed.   CURRENT MEDICATIONS:  Reviewed.   PHYSICAL EXAM:  The patient is a black male in no acute distress.  He is afebrile with stable vital signs.  O2 saturation is 96% on room  air.  HEENT:  Unremarkable.  NECK:  Supple without cervical adenopathy.  No JVD.  LUNGS:  Sounds are diminished in the bases.  Otherwise, clear.  CARDIAC:  Regular rate.  ABDOMEN:  Soft and nontender.  EXTREMITIES:  Warm without any edema.   IMPRESSION AND PLAN:  Severe difficult-to-control asthma in a patient  who refuses to take medications on a consistent basis despite being  given multiple samples.  Once again, the patient received patient  education and informed about the dangers of uncontrolled asthma.  The  patient has assured me that he will get his medications filled and will  return here in a 4-week followup with Dr. Sherene Jacobson at that time to determine  if he has in fact gotten prescriptions filled on return.  The patient  was not given any samples at today's visit.  He was given a prescription  for  an albuterol nebulizer medicine that will be sent through [facility] to provide him medication at home to use through his nebulizer if  needed.      Hector [place], NP  Electronically Signed      [name]. Hector Sires, MD, [name]  Electronically Signed   TP/MedQ  DD: 03/24/2007  DT: 03/24/2007  Job #: [id number]

## 2011-05-10 NOTE — Assessment & Plan Note (Signed)
[facility]                             PULMONARY OFFICE NOTE   NAME:[name], [name]                       MRN:          [id number]  DATE:12/03/2006                            DOB:          1941-05-23    HISTORY OF PRESENT ILLNESS:  70 year old black male with severe chronic  asthma with documented non adherence with a best FEV of around 2 liters  documented in February of 2002 and presently maintained on allergy  vaccinations with consideration for Xopenex treatment. Already taking  Advair 5/50 b.i.d. and now coming in with an acute exacerbation with  chest tightness and dyspnea requiring a rescue inhaler (which he doe not  usually use at baseline) several times daily but has not used one in the  last several days. He denies any excess sputum production but has  noticed tightness in his abdomen and mild constipation associated with  the dyspnea. He denies any orthopnea, nocturnal wheeze, fevers, chills,  sweats, plerotic chest pain, purulent sputum, or leg swelling.   For full medications please see patient sheet dated December preop 2007  for details.  Which correlate with his medication counter although I am not convinced  that he takes it as directed. He says he has another Advair at home, a  full Advair sample at home and only has 1 puff left on his present disc.   PHYSICAL EXAMINATION:  He is a ambulatory black male in no acute  distress who appears minimally dyspneic.  HEENT: Unremarkable. Pharynx clear. Dentition intact.  NECK: Supple without cervical adenopathy or tenderness.  Nasal turbinates: Normal.  Ear canals: Clear.  LUNGS: Field reveal diminished breath sounds bilaterally, no wheezing.  There is a regular rate and rhythm, without murmur, gallop, or rub.  ABDOMEN: Soft benign.  EXTREMITIES: Warm without calf tenderness, cyanosis or clubbing.   IMPRESSION:  1. Mild exacerbation of chronic obstructive pulmonary disease despite  maximum treatment with Advair and on allergy vaccination. I      recommend another 6 day course of predinsone.  2. Adherence continues to be an issue but I believe the patient is      reasonably adherent with the Advair at 5/50 b.i.d. and is failing      control and therefore a candidate for Xolair per the research      protocol.  3. Some of his dyspnea may be due to poor abdominal compliance      therefore I recommend Citracal one tsp b.i.d. and added it to his      maintenance regimen.   Follow up with me in 4 weeks. I gave him another Advair sample so that  he has a total of 4 weeks plus 1 so that he should be down to 1 puff  of his remaining inhaler when he returns if the count is right.  In the meantime I will be referring the chart to our research  coordinator to see if Xolair can be  initiated noting that he had a positive elevated IGE and RAST testing  documented by Dr. Maple Hudson on November 06, 2006.  [name]. Sherene Sires, MD, [facility]  Electronically Signed    MBW/MedQ  DD: 12/03/2006  DT: 12/03/2006  Job #: [id number]

## 2011-05-10 NOTE — Assessment & Plan Note (Signed)
[place] [facility]                               PULMONARY OFFICE NOTE   NAME:[name], [name]                       MRN:          [id number]  DATE:10/17/2006                            DOB:          December 06, 1941    PULMONARY/FOLLOWUP OFFICE VISIT   This 70 year old black male with severe asthma and documented nonadherence  returns today again with the incorrect count on his Advair for which he is  supposed to be on 500/50 b.i.d. and should have had plenty left on the 4-  week sample that he was provided but comes in with an empty canister,  stating he just ran out this morning.  He does admit to significant  increased wheezing since the weather turned damp 2 days ago but has not  required any extra albuterol at all in any form.  He also has run out of his  Nasonex.   For full inventory of medications as I understand them, please see face  sheet, dated October 17, 2006, but note that I am very skeptical that he is  compliant at all.   PHYSICAL EXAMINATION:  GENERAL:  He is a pleasant ambulatory black male with  a bit of a childish affect and attitude.  He is afebrile with normal vital  signs.  HEENT:  Unremarkable, pharynx clear.  LUNG FIELDS:  Reveal trace wheeze bilaterally with marked increase in  expiratory time and hyperresonance to percussion.  HEART:  Has a regular rhythm without murmur, gallop or rub.  ABDOMEN:  Soft, benign.  EXTREMITIES:  Warm without calf tenderness, cyanosis, clubbing, edema.   Heme saturation 94% on room air.   IMPRESSION:  Poorly-controlled asthma with documented nonadherence.  However, I do believe he is a candidate for the Xolair trial if he is  willing to be followed more closely, based on the fact that he is using the  Advair 500/50 b.i.d. with no improvement and has documented atopy for which  he is on allergy vaccinations without any apparent improvement.   I did document today that his DPI technique is quite good  and I gave him  another 2-week sample of Advair, to return in 2 weeks for a full medication  reconciliation and also to see if he is a candidate for the Xolair trial.    ______________________________  [name]. Sherene Sires, MD, [facility]    MBW/MedQ  DD: 10/17/2006  DT: 10/20/2006  Job #: [id number]

## 2011-05-10 NOTE — Assessment & Plan Note (Signed)
[place] [facility]                               PULMONARY OFFICE NOTE   NAME:[name], [name]                       MRN:          [id number]  DATE:08/15/2006                            DOB:          August 31, 1941    PULMONARY/FOLLOWUP OFFICE VISIT:   HISTORY OF PRESENT ILLNESS:  A 70 year old black male with severe difficult  to control asthma and documented nonadherence.  Maintained previously on  Advair 500/50 b.i.d. with no response previously to Qvar and switched to  Symbicort 160/4.5 two puffs b.i.d. on a trial basis.  He says he prefers the  Advair and has noticed increased dyspnea over baseline, but no nocturnal  symptoms, and has not needed any rescue therapy since his previous visit.   MEDICATIONS:  For full inventory of medications, please see column dated  August 15, 2006.   PHYSICAL EXAMINATION:  GENERAL:  He is a pleasant, ambulatory black male in  no acute distress.  VITAL SIGNS:  Stable.  HEENT:  Unremarkable.  Pharynx is clear.  LUNGS:  Lung fields are clear bilaterally.  Lung fields revealed end  expiratory wheeze bilaterally with moderate increased expiratory time.  HEART:  Regular rhythm without murmurs, gallops, rubs.  ABDOMEN:  Soft, benign.  EXTREMITIES:  Warm without calf tenderness, cyanosis, clubbing or edema.   LABORATORY DATA:  Spirometry was reviewed with the patient which revealed  FEV1 down to 1.38 from 1.81, compared to his all time best of 2.2 documented  in 2002 (albeit while on systemic steroids.).   IMPRESSION:  Severely difficult to control asthma refractory to Advair at a  dose of 500/50 b.i.d. and failing Symbicort.  He would therefore be a  candidate for the new trial for Xolair, if he is willing to consider it.  For now, I recommend the following.   RECOMMENDATIONS:  1. Resume Advair at 500/50 b.i.d. and stop Symbicort.  2. Review with him the use of p.r.n. Albuterol (at this point, I believe      he actually  under uses it which is unusual and difficult to control      asthma.   FOLLOWUP:  Follow up will be in four weeks with samples given for the next  four weeks and consideration for the Xolair trial when he returns (which  would offer him free medication), the option of premedication if he will  keep up with office visits.                                   [name]. Sherene Sires, MD, [facility]  MBW/MedQ  DD:  08/17/2006 DT:  08/18/2006 Job #:  [id number]

## 2011-05-13 ENCOUNTER — Ambulatory Visit: Payer: Self-pay | Admitting: Internal Medicine

## 2011-05-16 ENCOUNTER — Ambulatory Visit: Payer: Medicare Other | Admitting: Internal Medicine

## 2011-05-16 ENCOUNTER — Telehealth: Payer: Self-pay | Admitting: Internal Medicine

## 2011-05-16 NOTE — Telephone Encounter (Signed)
Spoke with pt and notified 1 box of nasonex left up front fot pick up.

## 2011-05-17 ENCOUNTER — Ambulatory Visit (INDEPENDENT_AMBULATORY_CARE_PROVIDER_SITE_OTHER): Payer: Medicare Other

## 2011-05-17 DIAGNOSIS — J309 Allergic rhinitis, unspecified: Secondary | ICD-10-CM

## 2011-05-27 ENCOUNTER — Encounter: Payer: Self-pay | Admitting: Internal Medicine

## 2011-05-31 ENCOUNTER — Ambulatory Visit (INDEPENDENT_AMBULATORY_CARE_PROVIDER_SITE_OTHER): Payer: Medicare Other

## 2011-05-31 DIAGNOSIS — J309 Allergic rhinitis, unspecified: Secondary | ICD-10-CM

## 2011-06-03 ENCOUNTER — Telehealth: Payer: Self-pay | Admitting: Internal Medicine

## 2011-06-03 MED ORDER — DOXYCYCLINE HYCLATE 100 MG PO TABS: ORAL_TABLET | ORAL | Status: AC

## 2011-06-03 NOTE — Telephone Encounter (Signed)
Per CDY: okay for doxycycline 100mg  #8, 2 today then 1 daily until gone.  No refills.  Called spoke with patient, advised of CDY's recs as stated above.  Pt verbalized his understanding.  rx sent to verified pharmacy.

## 2011-06-03 NOTE — Telephone Encounter (Signed)
Spoke with pt. He is c/o prod cough with yellow sputum x 2 days- denies f/c/s, SOB, wheeze, CP.  Would like abx called in.  MW and TP not here today and so will forward msg to Dr Maple Hudson since he is familiar with this pt.  Please advise thanks! [facility]-  No Known Allergies

## 2011-06-05 ENCOUNTER — Ambulatory Visit: Payer: Medicare Other | Admitting: Internal Medicine

## 2011-06-13 ENCOUNTER — Ambulatory Visit (INDEPENDENT_AMBULATORY_CARE_PROVIDER_SITE_OTHER): Payer: Medicare Other | Admitting: Internal Medicine

## 2011-06-13 ENCOUNTER — Encounter: Payer: Self-pay | Admitting: Internal Medicine

## 2011-06-13 ENCOUNTER — Ambulatory Visit (INDEPENDENT_AMBULATORY_CARE_PROVIDER_SITE_OTHER): Payer: Medicare Other

## 2011-06-13 DIAGNOSIS — J45909 Unspecified asthma, uncomplicated: Secondary | ICD-10-CM

## 2011-06-13 DIAGNOSIS — J309 Allergic rhinitis, unspecified: Secondary | ICD-10-CM

## 2011-06-13 NOTE — Patient Instructions (Addendum)
See calendar for specific medication instructions and bring it back for each and every office visit for every healthcare provider you see.  Without it,  you may not receive the best quality medical care that we feel you deserve.  You will note that the calendar groups together  your maintenance  medications that are timed at particular times of the day.  Think of this as your checklist for what your doctor has instructed you to do until your next evaluation to see what benefit  there is  to staying on a consistent group of medications intended to keep you well.  The other group at the bottom is entirely up to you to use as you see fit  for specific symptoms that may arise between visits that require you to treat them on an as needed basis.  Think of this as your action plan or "what if" list.   Separating the top medications from the bottom group is fundamental to providing you adequate care going forward.    Please schedule a follow up visit in 2 months but call sooner if needed  For CPX on return   LATE ADD  Dulera count is 19 and he says he has 2 more at home.  Using prevacid samples

## 2011-06-13 NOTE — Progress Notes (Signed)
Subjective:    Patient ID: [name], male    DOB: 1941-09-14, 70 y.o.   MRN: [id number]  HPI 69yobm never smoker born prematurely with asthma since age 47s.   January 11, 2010- Allergic asthma  Doing ok building allergy vaccine here. No reactions or problems. Asthma is "as good as it gets" now. Somedays he is clear with no wheeze, then weather changes and he may wheeze a little bit. It doesn't keep him awake or keep him from doing what he wants to do. Sometimes productive cough.   May 16, 2010- Allergic asthma, Rhinitis  Returning for allergy f/u. Sees Dr Sherene Sires regularly for primary pulmonary.  He has built allergy vaccine here to maintenance at 1:50 with no reactions. He says rhinitis and asthma have done well through the Spring pollen season. Needs refill Nasonex.  Last used Xopenex rescue inhaler 2-3 days ago.  Denies nasal congestion, sneeze, blowing or headache.   November 12, 2010- - Allergic asthma, Rhinitis  Nurse-CC: follow up visit-allergies.  Due to advance to 1:10 w/ next vaccineorder. Says he's doing ok.  Declines flu vax. No seasonal flare of asthma- no ER trips or prednisone. Ne recent need for either nebulizer or rescue inhaler  November 19, 2010 ov cc cough and congestion a little worse x sev days.  December 11, 2010 ov cough better, breathing ok DULERA Count 121 > count is 39 should be 34, no sob over baseline or need for rescue.  rec no change   January 09, 2011 ov cough and sob better. dulera count 64 denies rescue need or noct complaints.   February 20, 2011 ov with count at 72 (should be 44) but cc cough/sob better.    04/04/2011 Follow up and med review.  PT returns for 6 week follow up and med review. He has brought all his meds and we organized/update his med calendar.  He depends largely on samples. He recently had some persistent dysphagia and was seen by [facility] Dr. Jarold Motto. He underwent  endo 03/20/11 -esophageal stricture-s/p dilatation, ulcerated  stricture-bx was c/w gerd -neg for barretts. Rec for PPI . He was rx  Dexilant however has not picked up from pharmacy and unfortunately is not taking pepcid or zegerid/prevacid that he has  In his bag today. He says he misunderstood the instructions. I explained the reason for PPI and potential dangers of GERD.  rec He will start using his PPI meds that he has presently until Dexilant is approved at the pharmacy.  His asthma has been under control with rare use of xopenex or albuterol neb . NO ER visits. NO steroid since last ov.  He was given 3 Dulera samples today.    06/13/2011 ov/Taron Mondor cc no cough or sob.  Pt denies any significant sore throat, dysphagia, itching, sneezing,  nasal congestion or excess/ purulent secretions,  fever, chills, sweats, unintended wt loss, pleuritic or exertional cp, hempoptysis, orthopnea pnd or leg swelling.    Also denies any obvious fluctuation of symptoms with weather or environmental changes or other aggravating or alleviating factors.  Sleeping ok without nocturnal  or early am exac of resp c/o's or need for noct saba.            Objective:   Physical Exam GEN: A/Ox3; pleasant , NAD, well nourished  Wt 208 06/13/11  HEENT:  [place]/AT,  EACs-clear, TMs-wnl, NOSE-clear drainage  THROAT-clear, no lesions, no postnasal drip or exudate noted., several dental caries   NECK:  Supple w/  fair ROM; no JVD; normal carotid impulses w/o bruits; no thyromegaly or nodules palpated; no lymphadenopathy.  RESP   Distant bs with mod increased exp time but no audible wheeze  CARD:  RRR, no m/r/g  , no peripheral edema, pulses intact, no cyanosis or clubbing.  GI:   Soft & nt; nml bowel sounds; no organomegaly or masses detected.  Musco: Warm bil, no deformities or joint swelling noted.   Neuro: alert, no focal deficits noted.    Skin: Warm, no lesions or rashes         Assessment & Plan:

## 2011-06-14 ENCOUNTER — Encounter: Payer: Self-pay | Admitting: Internal Medicine

## 2011-06-14 NOTE — Assessment & Plan Note (Signed)
All goals of chronic asthma control met except not able to normalize function function and elimination of symptoms with minimal need for rescue therapy.  Contingencies discussed in full including contacting this office immediately if not controlling the symptoms using the rule of two's.    Each maintenance medication was reviewed in detail including most importantly the difference between maintenance and as needed and under what circumstances the prns are to be used. This was done in the context of a medication calendar review which provided the patient with a user-friendly unambiguous mechanism for medication administration and reconciliation and provides an action plan for all active problems. It is critical that this be shown to every doctor  for modification during the office visit if necessary so the patient can use it as a working document.

## 2011-06-17 ENCOUNTER — Encounter: Payer: Self-pay | Admitting: *Deleted

## 2011-06-18 ENCOUNTER — Ambulatory Visit (INDEPENDENT_AMBULATORY_CARE_PROVIDER_SITE_OTHER): Payer: Medicare Other | Admitting: Gastroenterology

## 2011-06-18 ENCOUNTER — Encounter: Payer: Self-pay | Admitting: Gastroenterology

## 2011-06-18 VITALS — BP 110/70 | HR 72 | Ht 68.0 in | Wt 205.4 lb

## 2011-06-18 DIAGNOSIS — J45909 Unspecified asthma, uncomplicated: Secondary | ICD-10-CM

## 2011-06-18 DIAGNOSIS — Z86010 Personal history of colonic polyps: Secondary | ICD-10-CM

## 2011-06-18 DIAGNOSIS — K219 Gastro-esophageal reflux disease without esophagitis: Secondary | ICD-10-CM

## 2011-06-18 MED ORDER — DEXLANSOPRAZOLE 60 MG PO CPDR: 60.0000 mg | DELAYED_RELEASE_CAPSULE | Freq: Every day | ORAL | Status: DC

## 2011-06-18 NOTE — Progress Notes (Signed)
This is a 70 year old African American male with chronic acid reflux and recurrent solid food dysphagia. Endoscopy and esophageal dilation was completed in March of this year with fairly good response, but he continues with intermittent reflux symptoms and dysphagia for solid foods. He denies other gastrointestinal or hepatobiliary problems. He has mild constipation, and has had previous colonoscopy by Dr. Carman Ching for removal small colon polyps. Currently all Prevacid 30 mg a day and also when necessary Aleve. There's been no anorexia, weight loss, hepatobiliary or systemic complaints.  Current Medications, Allergies, Past Medical History, Past Surgical History, Family History and Social History were reviewed in [employer] record.  Pertinent Review of Systems Negative   Physical Exam: Awake and alert in no acute distress. I cannot appreciate stigmata of chronic liver disease. His chest is clear cardiac exam is unremarkable. There is no hepatosplenomegaly, bowel masses, tenderness, or ascites. Bowel sounds are normal. Mental status is normal her peripheral extremities are unremarkable.    Assessment and Plan: Chronic GERD doing fairly well daily PPI therapy. He continues with some dysphagia, and we will repeat his endoscopy with guided progressive dilations as tolerated. Risk and benefit of this procedure has been explained in detail, he is agreed to proceed as planned. He is continue all other medications as per primary care. He does appear to have asthmatic bronchitis perhaps related to his chronic GERD. Encounter Diagnosis  Name Primary?  . Esophageal reflux Yes

## 2011-06-18 NOTE — Patient Instructions (Signed)
Stop your Prevacid and take Dexilant once a day 30 min before breakfast. Your procedure has been scheduled for 07/08/2011, please follow the seperate instructions.

## 2011-06-24 ENCOUNTER — Encounter: Payer: Self-pay | Admitting: Internal Medicine

## 2011-06-24 ENCOUNTER — Ambulatory Visit (INDEPENDENT_AMBULATORY_CARE_PROVIDER_SITE_OTHER): Payer: Medicare Other | Admitting: Internal Medicine

## 2011-06-24 VITALS — BP 106/66 | HR 72 | Ht 68.0 in | Wt 207.2 lb

## 2011-06-24 DIAGNOSIS — J45909 Unspecified asthma, uncomplicated: Secondary | ICD-10-CM

## 2011-06-24 DIAGNOSIS — J310 Chronic rhinitis: Secondary | ICD-10-CM

## 2011-06-24 NOTE — Patient Instructions (Addendum)
We will continue allergy vaccine, trying for long term benefit over 3 - 5 years if possible.   Keep your appointments with Dr Sherene Sires  Please call the allergy lab if you have any questions about allergy vaccine

## 2011-06-24 NOTE — Assessment & Plan Note (Addendum)
He opts to continue allergy vaccine. Dose is maintained now at 1:10, weekly. We will try to continue 3 years or so if we can, seeking sustainable improvement.

## 2011-06-24 NOTE — Assessment & Plan Note (Signed)
Continuing allergy vaccine as noted.  He is the clearest I have heard him.l

## 2011-06-24 NOTE — Progress Notes (Signed)
Subjective:    Patient ID: [name], male    DOB: 05-Nov-1941, 70 y.o.   MRN: [id number]  HPI    Review of Systems     Objective:   Physical Exam        Assessment & Plan:   Subjective:    Patient ID: [name], male    DOB: Jun 05, 1941, 70 y.o.   MRN: [id number]  HPI 70yobm never smoker born prematurely with asthma since age 63s.   January 11, 2010- Allergic asthma  Doing ok building allergy vaccine here. No reactions or problems. Asthma is "as good as it gets" now. Somedays he is clear with no wheeze, then weather changes and he may wheeze a little bit. It doesn't keep him awake or keep him from doing what he wants to do. Sometimes productive cough.   May 16, 2010- Allergic asthma, Rhinitis  Returning for allergy f/u. Sees Dr Sherene Sires regularly for primary pulmonary.  He has built allergy vaccine here to maintenance at 1:50 with no reactions. He says rhinitis and asthma have done well through the Spring pollen season. Needs refill Nasonex.  Last used Xopenex rescue inhaler 2-3 days ago.  Denies nasal congestion, sneeze, blowing or headache.   November 12, 2010- - Allergic asthma, Rhinitis  Nurse-CC: follow up visit-allergies.  Due to advance to 1:10 w/ next vaccineorder. Says he's doing ok.  Declines flu vax. No seasonal flare of asthma- no ER trips or prednisone. Ne recent need for either nebulizer or rescue inhaler  November 19, 2010 ov cc cough and congestion a little worse x sev days.  December 11, 2010 ov cough better, breathing ok DULERA Count 121 > count is 39 should be 34, no sob over baseline or need for rescue.  rec no change   January 09, 2011 ov cough and sob better. dulera count 64 denies rescue need or noct complaints.   February 20, 2011 ov with count at 72 (should be 44) but cc cough/sob better.    04/04/2011 Follow up and med review.  PT returns for 6 week follow up and med review. He has brought all his meds and we organized/update his med  calendar.  He depends largely on samples. He recently had some persistent dysphagia and was seen by [facility] Dr. Jarold Motto. 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 Rec for PPI . He was rx  Dexilant however has not picked up from pharmacy and unfortunately is not taking pepcid or zegerid/prevacid that he has  In his bag today. He says he misunderstood the instructions. I explained the reason for PPI and potential dangers of GERD.  He will start using his PPI meds that he has presently until Dexilant is approved at the pharmacy.  His asthma has been under control with rare use of xopenex or albuterol neb . NO ER visits. NO steroid since last ov.  He was given 3 Dulera samples today.   6/06/24/11- 70 yo never smoker followed for adult onset Allergic asthma, Rhinitis, complicated by GERD/ stricture. Continues f/u for allergy component on allergy vaccine at 1:10, GH. The combination of his therapies has him better off than he used to be, but he isn't sure which is most important. Denies reactions to his allergy shots.  We reviewed meds and role of allergy vaccine in his treatment plan.   Review of Systems Constitutional:   No  weight loss, night sweats,  Fevers, chills, fatigue, or  lassitude.  HEENT:   No headaches,  Difficulty swallowing,  Tooth/dental problems, or  Sore throat,                No sneezing, itching, ear ache + nasal congestion, post nasal drip,   CV:  No chest pain,  Orthopnea, PND, swelling in lower extremities, anasarca, dizziness, palpitations, syncope.   GI  No recent heartburn, indigestion, abdominal pain, nausea, vomiting, diarrhea, change in bowel habits, loss of appetite, bloody stools.  Neg dysphagia   Resp: No shortness of breath with exertion or at rest.  No excess mucus, no productive cough,  No non-productive cough,  No coughing up of blood.  No change in color of mucus.  No wheezing.     Skin: no rash or lesions.  GU: no dysuria, change in color of urine, no urgency or frequency.  No flank pain, no hematuria   MS:  No joint pain or swelling.  No decreased range of motion.  No back pain.  Psych:  No change in mood or affect. No depression or anxiety.  No memory loss.         Objective:   Physical Exam GEN: A/Ox3; pleasant , NAD, well nourished   HEENT:  [place]/AT,  EACs-clear, TMs-wnl, NOSE-clear drainage  THROAT-clear, no lesions, no postnasal drip or exudate noted., several dental caries   NECK:  Supple w/ fair ROM; no JVD; normal carotid impulses w/o bruits; no thyromegaly or nodules palpated; no lymphadenopathy.  RESP  Clear  P & A; w/o- wheezes/ rales/ or rhonchi.no accessory muscle use, no dullness to percussion  CARD:  RRR, no m/r/g  , no peripheral edema, pulses intact, no cyanosis or clubbing.  GI:   Soft & nt; nml bowel sounds; no organomegaly or masses detected.  Musco: Warm bil, no deformities or joint swelling noted.   Neuro: alert, no focal deficits noted.    Skin: Warm, no lesions or rashes         Assessment & Plan:

## 2011-07-08 ENCOUNTER — Ambulatory Visit (AMBULATORY_SURGERY_CENTER): Payer: Medicare Other | Admitting: Gastroenterology

## 2011-07-08 ENCOUNTER — Encounter: Payer: Self-pay | Admitting: Gastroenterology

## 2011-07-08 DIAGNOSIS — R1310 Dysphagia, unspecified: Secondary | ICD-10-CM

## 2011-07-08 DIAGNOSIS — R1314 Dysphagia, pharyngoesophageal phase: Secondary | ICD-10-CM

## 2011-07-08 DIAGNOSIS — K219 Gastro-esophageal reflux disease without esophagitis: Secondary | ICD-10-CM

## 2011-07-08 DIAGNOSIS — K222 Esophageal obstruction: Secondary | ICD-10-CM | POA: Insufficient documentation

## 2011-07-08 MED ORDER — SODIUM CHLORIDE 0.9 % IV SOLN: 500.0000 mL | INTRAVENOUS | Status: DC

## 2011-07-08 NOTE — Progress Notes (Signed)
Pt did have some gagging and moaning with dilation but, tolerated the exam well. MAW

## 2011-07-08 NOTE — Patient Instructions (Signed)
Esophageal Dilation Diet  Nothing By Mouth until 5:30 pm  Clear Liquids for one hour 5:30-6:30 pm  Soft foods 6:30 pm-tomorrow  You may resume your medications as you would normally take them.

## 2011-07-09 ENCOUNTER — Telehealth: Payer: Self-pay

## 2011-07-09 NOTE — Telephone Encounter (Signed)
Follow up Call- Patient questions:  Do you have a fever, pain , or abdominal swelling? no Pain Score  0 *  Have you tolerated food without any problems? yes  Have you been able to return to your normal activities? yes  Do you have any questions about your discharge instructions: Diet   no Medications  no Follow up visit  no  Do you have questions or concerns about your Care? no  Actions: * If pain score is 4 or above: No action needed, pain <4.  Per pt my throat is a little scratchy.  I advised him it was normal and if it does not take care of it's self in couple of days to call back.  Pt asked if he can go to work today.  I advised him yes.  MAW

## 2011-07-22 ENCOUNTER — Ambulatory Visit (INDEPENDENT_AMBULATORY_CARE_PROVIDER_SITE_OTHER): Payer: Medicare Other

## 2011-07-22 DIAGNOSIS — J309 Allergic rhinitis, unspecified: Secondary | ICD-10-CM

## 2011-07-23 ENCOUNTER — Ambulatory Visit (INDEPENDENT_AMBULATORY_CARE_PROVIDER_SITE_OTHER): Payer: Medicare Other

## 2011-07-23 DIAGNOSIS — J309 Allergic rhinitis, unspecified: Secondary | ICD-10-CM

## 2011-08-02 ENCOUNTER — Ambulatory Visit (INDEPENDENT_AMBULATORY_CARE_PROVIDER_SITE_OTHER): Payer: Medicare Other

## 2011-08-02 DIAGNOSIS — J309 Allergic rhinitis, unspecified: Secondary | ICD-10-CM

## 2011-08-07 NOTE — Progress Notes (Signed)
Subjective:    Patient ID: [name], male    DOB: [birth year], 70 y.o.   MRN: [id number]  HPI 22 yobm never smoker born prematurely with asthma since age 3s.   November 12, 2010- - Allergic asthma, Rhinitis  Nurse-CC: follow up visit-allergies.  Due to advance to 1:10 w/ next vaccineorder. Says he's doing ok.  Declines flu vax. No seasonal flare of asthma- no ER trips or prednisone. Ne recent need for either nebulizer or rescue inhaler > no change rx  December 11, 2010 ov cough better, breathing ok DULERA Count 121 > count is 39 should be 34, no sob over baseline or need for rescue.  rec no change rx   February 20, 2011 ov with count at 72 (should be 44) but cc cough/sob better.  rec no change rx  04/04/2011 Follow up and med review.  PT returns for 6 week follow up and med review. He has brought all his meds and we organized/update his med calendar.  He depends largely on samples. He recently had some persistent dysphagia and was seen by [facility] Dr. Jarold Motto. 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 Rec for PPI . He was rx  Dexilant however has not picked up from pharmacy and unfortunately is not taking pepcid or zegerid/prevacid that he has  In his bag today. He says he misunderstood the instructions. I explained the reason for PPI and potential dangers of GERD.  rec He will start using his PPI meds that he has presently until Dexilant is approved at the pharmacy.  His asthma has been under control with rare use of xopenex or albuterol neb . NO ER visits. NO steroid since last ov.  He was given 3 Dulera samples today.    06/13/2011 ov/Wert cc no cough or sob.  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  Using prevacid samples   08/08/2011  Wert/ cpx down to last dulera Count is 81, no other samples at home. occ awakens with wheeze prior to am dulera but denies using saba. No purulent sputum.  Pt denies any significant sore throat, dysphagia, itching, sneezing,  nasal congestion or excess/ purulent secretions,  fever, chills, sweats, unintended wt loss, pleuritic or exertional cp, hempoptysis, orthopnea pnd or leg swelling.    Also denies any obvious fluctuation of symptoms with weather or environmental changes or other aggravating or alleviating factors.             Objective:   Physical Exam  GEN: A/Ox3; pleasant , NAD, well nourished, somewhat of a childlike affect  Wt 208 06/13/11  > 208 08/08/2011   HEENT:  [place]/AT,  EACs-clear, TMs-wnl, NOSE-clear drainage  THROAT-clear, no lesions, no postnasal drip or exudate noted., several dental caries   NECK:  Supple w/ fair ROM; no JVD; normal carotid impulses w/o bruits; no thyromegaly or nodules palpated; no lymphadenopathy.  RESP   Distant bs with mod increased exp time but no audible wheeze  CARD:  RRR, no m/r/g  , no peripheral edema, pulses intact, no cyanosis or clubbing.  GI:   Soft & nt; nml bowel sounds; no organomegaly or masses detected.  Musco: Warm bil, no deformities or joint swelling noted. Nl gait, no restrictions  Neuro: alert,  no focal deficits noted.    Skin: Warm, no lesions or rashes  GU  Uncirm, no testicular nodules, no IH  Rectal: mod bph, minimal asym, no nodules. Stool G neg    CXR  08/08/2011  Exercise for 30 min every day at a level where you are continually short of breath but not out of breath  See calendar for specific medication instructions          Assessment & Plan:

## 2011-08-08 ENCOUNTER — Ambulatory Visit (INDEPENDENT_AMBULATORY_CARE_PROVIDER_SITE_OTHER): Payer: Medicare Other

## 2011-08-08 ENCOUNTER — Ambulatory Visit (INDEPENDENT_AMBULATORY_CARE_PROVIDER_SITE_OTHER)
Admission: RE | Admit: 2011-08-08 | Discharge: 2011-08-08 | Disposition: A | Discharge status: Home / Self Care | Payer: Medicare Other | Source: Ambulatory Visit | Attending: Internal Medicine | Admitting: Internal Medicine

## 2011-08-08 ENCOUNTER — Other Ambulatory Visit (INDEPENDENT_AMBULATORY_CARE_PROVIDER_SITE_OTHER): Payer: Medicare Other | Admitting: Internal Medicine

## 2011-08-08 ENCOUNTER — Other Ambulatory Visit (INDEPENDENT_AMBULATORY_CARE_PROVIDER_SITE_OTHER): Payer: Medicare Other

## 2011-08-08 ENCOUNTER — Encounter: Payer: Self-pay | Admitting: Internal Medicine

## 2011-08-08 ENCOUNTER — Ambulatory Visit (INDEPENDENT_AMBULATORY_CARE_PROVIDER_SITE_OTHER): Payer: Medicare Other | Admitting: Internal Medicine

## 2011-08-08 VITALS — BP 140/84 | HR 69 | Temp 97.3°F | Ht 68.0 in | Wt 208.4 lb

## 2011-08-08 DIAGNOSIS — Z0000 Encounter for general adult medical examination without abnormal findings: Secondary | ICD-10-CM

## 2011-08-08 DIAGNOSIS — E785 Hyperlipidemia, unspecified: Secondary | ICD-10-CM | POA: Insufficient documentation

## 2011-08-08 DIAGNOSIS — J309 Allergic rhinitis, unspecified: Secondary | ICD-10-CM

## 2011-08-08 DIAGNOSIS — I10 Essential (primary) hypertension: Secondary | ICD-10-CM

## 2011-08-08 DIAGNOSIS — J45909 Unspecified asthma, uncomplicated: Secondary | ICD-10-CM

## 2011-08-08 LAB — URINALYSIS, ROUTINE W REFLEX MICROSCOPIC
Bilirubin Urine: NEGATIVE
Leukocytes, UA: NEGATIVE
Nitrite: NEGATIVE
pH: 6 (ref 5.0–8.0)

## 2011-08-08 LAB — LIPID PANEL
Cholesterol: 225 mg/dL — ABNORMAL HIGH (ref 0–200)
HDL: 48.6 mg/dL (ref >39.00)
Total CHOL/HDL Ratio: 5
VLDL: 41.2 mg/dL — ABNORMAL HIGH (ref 0.0–40.0)

## 2011-08-08 LAB — HEPATIC FUNCTION PANEL
Albumin: 4.1 g/dL (ref 3.5–5.2)
Alkaline Phosphatase: 79 U/L (ref 39–117)
Total Bilirubin: 0.9 mg/dL (ref 0.3–1.2)

## 2011-08-08 LAB — CBC WITH DIFFERENTIAL/PLATELET
Basophils Absolute: 0.1 10*3/uL (ref 0.0–0.1)
Eosinophils Absolute: 0.5 10*3/uL (ref 0.0–0.7)
Hemoglobin: 14.4 g/dL (ref 13.0–17.0)
Lymphocytes Relative: 43.2 % (ref 12.0–46.0)
MCHC: 33 g/dL (ref 30.0–36.0)
Neutro Abs: 3.7 10*3/uL (ref 1.4–7.7)
Neutrophils Relative %: 42.8 % — ABNORMAL LOW (ref 43.0–77.0)
Platelets: 278 10*3/uL (ref 150.0–400.0)
RDW: 14.1 % (ref 11.5–14.6)

## 2011-08-08 LAB — BASIC METABOLIC PANEL
BUN: 14 mg/dL (ref 6–23)
CO2: 26 mEq/L (ref 19–32)
Calcium: 8.8 mg/dL (ref 8.4–10.5)
Creatinine, Ser: 1.3 mg/dL (ref 0.4–1.5)
Glucose, Bld: 101 mg/dL — ABNORMAL HIGH (ref 70–99)

## 2011-08-08 NOTE — Assessment & Plan Note (Signed)
   Each maintenance medication was reviewed in detail including most importantly the difference between maintenance and as needed and under what circumstances the prns are to be used. This was done in the context of a medication calendar review which provided the patient with a user-friendly unambiguous mechanism for medication administration and reconciliation and provides an action plan for all active problems. It is critical that this be shown to every doctor  for modification during the office visit if necessary so the patient can use it as a working document.     

## 2011-08-08 NOTE — Assessment & Plan Note (Addendum)
Adequate control on present rx, reviewed low salt diet

## 2011-08-08 NOTE — Assessment & Plan Note (Signed)
All goals of chronic asthma control met including optimal (though not nl) function and elimination of symptoms with minimal need for rescue therapy.  Contingencies discussed in full including contacting this office immediately if not controlling the symptoms using the rule of two's.

## 2011-08-08 NOTE — Assessment & Plan Note (Signed)
Will review by phone not at [employer]  See instructions for specific recommendations which were reviewed directly with the patient who was given a copy with highlighter outlining the key components.

## 2011-08-08 NOTE — Patient Instructions (Signed)
Add pepcid 20 mg at bedtime  Your dulera count is 81 and we gave you another sample today with count 60  See [name] w/in 4 weeks with all your medications, even over the counter meds, separated in two separate bags, the ones you take no matter what vs the ones you stop once you feel better and take only as needed when you feel you need them.   Tammy  will generate for you a new user friendly medication calendar that will put [place] all on the same page re: your medication use.     Without this process, it simply isn't possible to assure that we are providing  your outpatient care  with  the attention to detail we feel you deserve.   If we cannot assure that you're getting that kind of care,  then we cannot manage your problem effectively from this clinic.  Once you have seen Tammy and we are sure that we're all on the same page with your medication use she will arrange follow up with me.

## 2011-08-23 ENCOUNTER — Ambulatory Visit (INDEPENDENT_AMBULATORY_CARE_PROVIDER_SITE_OTHER): Payer: Medicare Other

## 2011-08-23 DIAGNOSIS — J309 Allergic rhinitis, unspecified: Secondary | ICD-10-CM

## 2011-09-05 ENCOUNTER — Ambulatory Visit (INDEPENDENT_AMBULATORY_CARE_PROVIDER_SITE_OTHER): Payer: Medicare Other

## 2011-09-05 ENCOUNTER — Encounter: Payer: Self-pay | Admitting: Adult Health

## 2011-09-05 ENCOUNTER — Ambulatory Visit (INDEPENDENT_AMBULATORY_CARE_PROVIDER_SITE_OTHER): Payer: Medicare Other | Admitting: Adult Health

## 2011-09-05 DIAGNOSIS — J45909 Unspecified asthma, uncomplicated: Secondary | ICD-10-CM

## 2011-09-05 DIAGNOSIS — J309 Allergic rhinitis, unspecified: Secondary | ICD-10-CM

## 2011-09-05 DIAGNOSIS — I10 Essential (primary) hypertension: Secondary | ICD-10-CM | POA: Insufficient documentation

## 2011-09-05 NOTE — Assessment & Plan Note (Signed)
Begin Benicar 40mg  1/2 daily -for blood pressure Check blood pressure 3 times a week in morning at rest , keep log, bring back to next visit.  follow up in 4 weeks and As needed   Follow med calenadar closely and bring to each visit.  Limit  Ibuprofen, aleve, etc meds and avoid sudafed (decongestant/sinus )meds   Low salt diet, exercise as tolerated, and weight loss.

## 2011-09-05 NOTE — Patient Instructions (Signed)
Restart Pepcid 20mg  At bedtime   Begin Benicar 40mg  1/2 daily -for blood pressure Check blood pressure 3 times a week in morning at rest , keep log, bring back to next visit.  follow up in 4 weeks and As needed   Follow med calenadar closely and bring to each visit.  Limit  Ibuprofen, aleve, etc meds and avoid sudafed (decongestant/sinus )meds  We gave you a nasonex and Dulera sample Low salt diet, exercise as tolerated, and weight loss.

## 2011-09-05 NOTE — Assessment & Plan Note (Signed)
Compensated on regimen Depends heavily on samples Encouraged on med compliance Sample of nasonex and dulera given  Refused flu shot.  Patient's medications were reviewed today and patient education was given. Computerized medication calendar was adjusted/completed

## 2011-09-05 NOTE — Progress Notes (Signed)
Subjective:    Patient ID: [name], male    DOB: [birth year], 70 y.o.   MRN: [id number]  HPI 89 yobm never smoker born prematurely with asthma since age 81s.   November 12, 2010- - Allergic asthma, Rhinitis  Nurse-CC: follow up visit-allergies.  Due to advance to 1:10 w/ next vaccineorder. Says he's doing ok.  Declines flu vax. No seasonal flare of asthma- no ER trips or prednisone. Ne recent need for either nebulizer or rescue inhaler > no change rx  December 11, 2010 ov cough better, breathing ok DULERA Count 121 > count is 39 should be 34, no sob over baseline or need for rescue.  rec no change rx   February 20, 2011 ov with count at 72 (should be 44) but cc cough/sob better.  rec no change rx  04/04/2011 Follow up and med review.  PT returns for 6 week follow up and med review. He has brought all his meds and we organized/update his med calendar.  He depends largely on samples. He recently had some persistent dysphagia and was seen by [facility] Dr. Jarold Motto. 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 Rec for PPI . He was rx  Dexilant however has not picked up from pharmacy and unfortunately is not taking pepcid or zegerid/prevacid that he has  In his bag today. He says he misunderstood the instructions. I explained the reason for PPI and potential dangers of GERD.  rec He will start using his PPI meds that he has presently until Dexilant is approved at the pharmacy.  His asthma has been under control with rare use of xopenex or albuterol neb . NO ER visits. NO steroid since last ov.  He was given 3 Dulera samples today.    06/13/2011 ov/Wert cc no cough or sob.  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  Using prevacid samples   08/08/2011  Wert/ cpx down to last dulera Count is 81, no other samples at home. occ awakens with wheeze prior to am dulera but denies using saba. No purulent sputum. >>add pepcid At bedtime  , labs essentially unremarkable except elevated cholestrol.   09/05/2011 Follow up and med review Pt returns for follow up and med review .  Dulera count is 3. Does admit to some missed doses (Should be on new inhaler ) .  We reviewed all his meds . He is not taking protonix-taking prevacid otc . Did not get pepcid.  Says he is feeling good, no asthma flares. NO increased SABA use or nocturnal awakenings.    B/P is elevated today , recently elevated at dentist. Discussed healthy lifestyle changes and avoidance NSAIDS and decongestants. No headache , chest pain or visual/speech changes.         Objective:   Physical Exam  GEN: A/Ox3; pleasant , NAD, well nourished, overweight   Wt 208 06/13/11  > 208 08/08/2011 >>206 09/05/2011   HEENT:  [place]/AT,  EACs-clear, TMs-wnl, NOSE-clear drainage  THROAT-clear, no lesions, no postnasal drip or exudate noted., several dental caries   NECK:  Supple w/ fair ROM; no JVD; normal carotid impulses w/o bruits; no thyromegaly or nodules palpated; no lymphadenopathy.  RESP   Distant bs with mod increased exp time but no  audible wheeze  CARD:  RRR, no m/r/g  , no peripheral edema, pulses intact, no cyanosis or clubbing.  GI:   Soft & nt; nml bowel sounds; no organomegaly or masses detected.  Musco: Warm bil, no deformities or joint swelling noted. Nl gait, no restrictions  Neuro: alert, no focal deficits noted.    Skin: Warm, no lesions or rashes              Assessment & Plan:

## 2011-10-03 ENCOUNTER — Ambulatory Visit (INDEPENDENT_AMBULATORY_CARE_PROVIDER_SITE_OTHER): Payer: Medicare Other

## 2011-10-03 ENCOUNTER — Ambulatory Visit (INDEPENDENT_AMBULATORY_CARE_PROVIDER_SITE_OTHER): Payer: Medicare Other | Admitting: Adult Health

## 2011-10-03 ENCOUNTER — Encounter: Payer: Self-pay | Admitting: Adult Health

## 2011-10-03 DIAGNOSIS — J45909 Unspecified asthma, uncomplicated: Secondary | ICD-10-CM

## 2011-10-03 DIAGNOSIS — I10 Essential (primary) hypertension: Secondary | ICD-10-CM

## 2011-10-03 DIAGNOSIS — J309 Allergic rhinitis, unspecified: Secondary | ICD-10-CM

## 2011-10-03 NOTE — Progress Notes (Signed)
Subjective:    Patient ID: [name], male    DOB: 31-Aug-1941, 70 y.o.   MRN: [id number]  HPI 89 yobm never smoker born prematurely with asthma since age 60s.   November 12, 2010- - Allergic asthma, Rhinitis  Nurse-CC: follow up visit-allergies.  Due to advance to 1:10 w/ next vaccineorder. Says he's doing ok.  Declines flu vax. No seasonal flare of asthma- no ER trips or prednisone. Ne recent need for either nebulizer or rescue inhaler > no change rx  December 11, 2010 ov cough better, breathing ok DULERA Count 121 > count is 39 should be 34, no sob over baseline or need for rescue.  rec no change rx   February 20, 2011 ov with count at 72 (should be 44) but cc cough/sob better.  rec no change rx  04/04/2011 Follow up and med review.  PT returns for 6 week follow up and med review. He has brought all his meds and we organized/update his med calendar.  He depends largely on samples. He recently had some persistent dysphagia and was seen by [facility] Dr. Jarold Motto. 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 Rec for PPI . He was rx  Dexilant however has not picked up from pharmacy and unfortunately is not taking pepcid or zegerid/prevacid that he has  In his bag today. He says he misunderstood the instructions. I explained the reason for PPI and potential dangers of GERD.  rec He will start using his PPI meds that he has presently until Dexilant is approved at the pharmacy.  His asthma has been under control with rare use of xopenex or albuterol neb . NO ER visits. NO steroid since last ov.  He was given 3 Dulera samples today.    06/13/2011 ov/Wert cc no cough or sob.  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  Using prevacid samples   08/08/2011  Wert/ cpx down to last dulera Count is 81, no other samples at home. occ awakens with wheeze prior to am dulera but denies using saba. No purulent sputum. >>add pepcid At bedtime  , labs essentially unremarkable except elevated cholestrol.   09/05/2011 Follow up and med review Pt returns for follow up and med review .  Dulera count is 3. Does admit to some missed doses (Should be on new inhaler ) .  We reviewed all his meds . He is not taking protonix-taking prevacid otc . Did not get pepcid.  Says he is feeling good, no asthma flares. NO increased SABA use or nocturnal awakenings.    B/P is elevated today , recently elevated at dentist. Discussed healthy lifestyle changes and avoidance NSAIDS and decongestants. No headache , chest pain or visual/speech changes.  >>med cal, added benicar 40mg  1/2 daily   10/03/2011 Follow up  Pt returns today for follow up of b/p. Last ov he was started on Benicar 20mg  due to elevated b/p. B/P at home has been doing well w/ avg 120-130 systolic. No chest pain , headache or dyspnea. No edema.   Breathing is at his baseline w/ no flare of  Cough or wheezing. No increased SABA use.  Dulera on 0 today .   ROS:   Constitutional:   No  weight loss,  night sweats,  Fevers, chills, fatigue, or  lassitude.  HEENT:   No headaches,  Difficulty swallowing,  Tooth/dental problems, or  Sore throat,                No sneezing, itching, ear ache, nasal congestion, post nasal drip,   CV:  No chest pain,  Orthopnea, PND, swelling in lower extremities, anasarca, dizziness, palpitations, syncope.   GI  No heartburn, indigestion, abdominal pain, nausea, vomiting, diarrhea, change in bowel habits, loss of appetite, bloody stools.   Resp: No shortness of breath with exertion or at rest.  No excess mucus, no productive cough,  No non-productive cough,  No coughing up of blood.  No change in color of mucus.  No wheezing.  No chest wall deformity  Skin: no rash or lesions.  GU:  no dysuria, change in color of urine, no urgency or frequency.  No flank pain, no hematuria   MS:  No joint pain or swelling.  No decreased range of motion.  No back pain.  Psych:  No change in mood or affect. No depression or anxiety.  No memory loss.         Objective:   Physical Exam  GEN: A/Ox3; pleasant , NAD, well nourished, overweight   Wt 208 06/13/11  > 208 08/08/2011 >>206 09/05/2011 >>202 10/03/2011   HEENT:  [place]/AT,  EACs-clear, TMs-wnl, NOSE-clear drainage  THROAT-clear, no lesions, no postnasal drip or exudate noted., several dental caries   NECK:  Supple w/ fair ROM; no JVD; normal carotid impulses w/o bruits; no thyromegaly or nodules palpated; no lymphadenopathy.  RESP   Distant bs with mod increased exp time but no audible wheeze  CARD:  RRR, no m/r/g  , no peripheral edema, pulses intact, no cyanosis or clubbing.  GI:   Soft & nt; nml bowel sounds; no organomegaly or masses detected.  Musco: Warm bil, no deformities or joint swelling noted. Nl gait, no restrictions  Neuro: alert, no focal deficits noted.    Skin: Warm, no lesions or rashes              Assessment & Plan:

## 2011-10-03 NOTE — Patient Instructions (Addendum)
Continue on Benicar 40mg  1/2 daily -for blood pressure Check blood pressure 3 times a week in morning at rest , keep log, bring back to next visit.  follow up in 6 weeks  and As needed   Limit  Ibuprofen, aleve, etc meds and avoid sudafed (decongestant/sinus )meds  We gave you a benicar , nasonex and Dulera sample today  Low salt diet, exercise as tolerated, and weight loss.

## 2011-10-03 NOTE — Assessment & Plan Note (Signed)
Compensated on present regimen.   

## 2011-10-03 NOTE — Assessment & Plan Note (Signed)
Improved control    Plan:   Continue on Benicar 40mg  1/2 daily -for blood pressure Check blood pressure 3 times a week in morning at rest , keep log, bring back to next visit.  follow up in 6 weeks  and As needed   Limit  Ibuprofen, aleve, etc meds and avoid sudafed (decongestant/sinus )meds  We gave you a benicar , nasonex and Dulera sample today  Low salt diet, exercise as tolerated, and weight loss.

## 2011-10-08 ENCOUNTER — Telehealth: Payer: Self-pay | Admitting: Internal Medicine

## 2011-10-08 NOTE — Telephone Encounter (Signed)
Lm on made vm to advise our office has not attempted to call him. I advised pt to call back if he had any questions.

## 2011-10-18 ENCOUNTER — Ambulatory Visit (INDEPENDENT_AMBULATORY_CARE_PROVIDER_SITE_OTHER): Payer: Medicare Other

## 2011-10-18 DIAGNOSIS — J309 Allergic rhinitis, unspecified: Secondary | ICD-10-CM

## 2011-10-25 ENCOUNTER — Ambulatory Visit (INDEPENDENT_AMBULATORY_CARE_PROVIDER_SITE_OTHER): Payer: Medicare Other

## 2011-10-25 ENCOUNTER — Ambulatory Visit (INDEPENDENT_AMBULATORY_CARE_PROVIDER_SITE_OTHER): Payer: Medicare Other | Admitting: Internal Medicine

## 2011-10-25 ENCOUNTER — Telehealth: Payer: Self-pay | Admitting: Internal Medicine

## 2011-10-25 ENCOUNTER — Encounter: Payer: Self-pay | Admitting: Internal Medicine

## 2011-10-25 VITALS — BP 122/78 | HR 70 | Temp 97.7°F | Ht 68.0 in | Wt 201.6 lb

## 2011-10-25 DIAGNOSIS — J309 Allergic rhinitis, unspecified: Secondary | ICD-10-CM

## 2011-10-25 DIAGNOSIS — J45909 Unspecified asthma, uncomplicated: Secondary | ICD-10-CM

## 2011-10-25 MED ORDER — PREDNISONE (PAK) 10 MG PO TABS: ORAL_TABLET | ORAL | Status: AC

## 2011-10-25 MED ORDER — DOXYCYCLINE HYCLATE 100 MG PO TABS: 100.0000 mg | ORAL_TABLET | Freq: Two times a day (BID) | ORAL | Status: AC

## 2011-10-25 NOTE — Telephone Encounter (Signed)
Pt c/o cough and congestion x 1 day. Phlegm is green in color and he does have slight sore throat. He denies any sob or wheezing. A little chest tightness. Pt scheduled to see MW today at 2:30pm.

## 2011-10-25 NOTE — Patient Instructions (Signed)
Doxycycline 100 mg twice daily before eat with glass of water  Prednisone 10 mg take  4 each am x 2 days,   2 each am x 2 days,  1 each am x2days and stop   Keep previous appt for Nov 26 but bring all medications with you to the visit, call sooner if needed

## 2011-10-25 NOTE — Progress Notes (Signed)
Subjective:    Patient ID: [name], male    DOB: Jul 08, 1941, 70 y.o.   MRN: [id number]  HPI 32 yobm never smoker born prematurely with asthma since age 40s.   November 12, 2010- - Allergic asthma, Rhinitis  Nurse-CC: follow up visit-allergies.  Due to advance to 1:10 w/ next vaccineorder. Says he's doing ok.  Declines flu vax. No seasonal flare of asthma- no ER trips or prednisone. Ne recent need for either nebulizer or rescue inhaler > no change rx  December 11, 2010 ov cough better, breathing ok DULERA Count 121 > count is 39 should be 34, no sob over baseline or need for rescue.  rec no change rx   February 20, 2011 ov with count at 72 (should be 44) but cc cough/sob better.  rec no change rx  04/04/2011 NP f/u  and med review >brought all his meds and we organized/update his med calendar.  He depends largely on samples. He recently had some persistent dysphagia and was seen by [facility] Dr. Jarold Motto. 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 Rec for PPI . He was rx  Dexilant however has not picked up from pharmacy and unfortunately is not taking pepcid or zegerid/prevacid that he has  In his bag today. He says he misunderstood the instructions. I explained the reason for PPI and potential dangers of GERD.  rec He will start using his PPI meds that he has presently until Dexilant is approved at the pharmacy.  His asthma has been under control with rare use of xopenex or albuterol neb . NO ER visits. NO steroid since last ov.  He was given 3 Dulera samples .    06/13/2011 ov/[name] cc no cough or sob.  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  Using prevacid samples   08/08/2011 [name]/ cpx down to last dulera Count is 81, no other samples at  home. occ awakens with wheeze prior to am dulera but denies using saba. No purulent sputum. >>add pepcid At bedtime  , labs essentially unremarkable except elevated cholestrol.    10/03/2011 NP/ follow up of b/p. Last ov he was started on Benicar 20mg  due to elevated b/p. B/P at home has been doing well w/ avg 120-130 systolic. No chest pain , headache or dyspnea. No edema.   Breathing is at his baseline w/ no flare of  Cough or wheezing. No increased SABA use.  Dulera on 0 today .  rec  Continue on Benicar 40mg  1/2 daily -for blood pressure Check blood pressure 3 times a week in morning at rest , keep log, bring back to next visit.  follow up in 6 weeks  and As needed   Limit  Ibuprofen, aleve, etc meds and avoid sudafed (decongestant/sinus )meds  We gave you a benicar , nasonex and Dulera sample today  Low salt diet, exercise as tolerated, and weight loss.     10/25/2011 f/u ov/Shawntay Prest cc c/o productive cough with green mucus  X 24 hours, acute onset, assoc  chest tightness, runny nose but no need for neb while on dulera 200 2 bid which he says he's using regularly but should be out of by our count.  Did not bring meds or calendar as requested.  Prior to acute exac Sleeping ok without nocturnal  or early am exacerbation  of respiratory  c/o's or need for noct saba. Also denies any obvious fluctuation of symptoms with weather or environmental changes or other aggravating or alleviating factors except as outlined above   ROS  At present neg for  any significant sore throat, dysphagia, itching, sneezing, fever, chills, sweats, unintended wt loss, pleuritic or exertional cp, hempoptysis, orthopnea pnd or leg swelling.  Also denies presyncope, palpitations, heartburn, abdominal pain, nausea, vomiting, diarrhea  or change in bowel or urinary habits, dysuria,hematuria,  rash, arthralgias, visual complaints, headache, numbness weakness or ataxia.                  Objective:   Physical  Exam  GEN: A/Ox3; pleasant , NAD, well nourished, overweight   Wt 208 06/13/11  > 208 08/08/2011 >>206 09/05/2011 >>202 10/03/2011 > 201 10/25/11  HEENT:  [name]/AT,  EACs-clear, TMs-wnl, NOSE-clear drainage  THROAT-clear, no lesions, no postnasal drip or exudate noted., several dental caries   NECK:  Supple w/ fair ROM; no JVD; normal carotid impulses w/o bruits; no thyromegaly or nodules palpated; no lymphadenopathy.  RESP   Distant bs with mod increased exp time with distant late bilateral  wheeze  CARD:  RRR, no m/r/g  , no peripheral edema, pulses intact, no cyanosis or clubbing.  GI:   Soft & nt; nml bowel sounds; no organomegaly or masses detected.  Musco: Warm bil, no deformities or joint swelling noted. Nl gait, no restrictions  Neuro: alert, no focal deficits noted.    Skin: Warm, no lesions or rashes              Assessment & Plan:

## 2011-10-26 NOTE — Assessment & Plan Note (Signed)
DDX of  difficult airways managment all start with A and  include Adherence, Ace Inhibitors, Acid Reflux, Active Sinus Disease, Alpha 1 Antitripsin deficiency, Anxiety masquerading as Airways dz,  ABPA,  allergy(esp in young), Aspiration (esp in elderly), Adverse effects of DPI,  Active smokers, plus two Bs  = Bronchiectasis and Beta blocker use..and one C= CHF   Adherence is always the initial "prime suspect" and is a multilayered concern that requires a "trust but verify" approach in every patient - starting with knowing how to use medications, especially inhalers, correctly, keeping up with refills and understanding the fundamental difference between maintenance and prns vs  medications only taken for a very short course and then stopped and not refilled. Unfortunately not following recs re use of med calendar and keeping up with meds, now with uri/ acute exac  See instructions for specific recommendations which were reviewed directly with the patient who was given a copy with highlighter outlining the key components.  Will regroup on med reconciliation at next ov

## 2011-11-08 ENCOUNTER — Ambulatory Visit (INDEPENDENT_AMBULATORY_CARE_PROVIDER_SITE_OTHER): Payer: Medicare Other

## 2011-11-08 ENCOUNTER — Ambulatory Visit (INDEPENDENT_AMBULATORY_CARE_PROVIDER_SITE_OTHER): Payer: Medicare Other | Admitting: Adult Health

## 2011-11-08 ENCOUNTER — Telehealth: Payer: Self-pay | Admitting: Internal Medicine

## 2011-11-08 ENCOUNTER — Encounter: Payer: Self-pay | Admitting: Adult Health

## 2011-11-08 DIAGNOSIS — J45909 Unspecified asthma, uncomplicated: Secondary | ICD-10-CM

## 2011-11-08 DIAGNOSIS — J309 Allergic rhinitis, unspecified: Secondary | ICD-10-CM

## 2011-11-08 MED ORDER — PREDNISONE 10 MG PO TABS: ORAL_TABLET | ORAL | Status: DC

## 2011-11-08 MED ORDER — HYDROCODONE-HOMATROPINE 5-1.5 MG/5ML PO SYRP: 5.0000 mL | ORAL_SOLUTION | Freq: Four times a day (QID) | ORAL | Status: AC | PRN

## 2011-11-08 MED ORDER — LEVOFLOXACIN 500 MG PO TABS: 500.0000 mg | ORAL_TABLET | Freq: Every day | ORAL | Status: DC

## 2011-11-08 NOTE — Assessment & Plan Note (Signed)
Slow to resolve asthma bronchitic Exacerabation   Plan;  Levaquin 500mg  daily for 7 days  Mucinex DM Twice daily  As needed  Cough/congestion  Prednisone taper over next week.  Fluids and rest  Please contact office for sooner follow up if symptoms do not improve or worsen or seek emergency care

## 2011-11-08 NOTE — Telephone Encounter (Signed)
Per Nicholos Johns pt is scheduled at 12:00 today with TP

## 2011-11-08 NOTE — Progress Notes (Signed)
Subjective:    Patient ID: [name], male    DOB: December 22, 1941, 70 y.o.   MRN: [id number]  HPI 89 yobm never smoker born prematurely with asthma since age 34s.   November 12, 2010- - Allergic asthma, Rhinitis  Nurse-CC: follow up visit-allergies.  Due to advance to 1:10 w/ next vaccineorder. Says he's doing ok.  Declines flu vax. No seasonal flare of asthma- no ER trips or prednisone. Ne recent need for either nebulizer or rescue inhaler > no change rx  December 11, 2010 ov cough better, breathing ok DULERA Count 121 > count is 39 should be 34, no sob over baseline or need for rescue.  rec no change rx   February 20, 2011 ov with count at 72 (should be 44) but cc cough/sob better.  rec no change rx  04/04/2011 NP f/u  and med review >brought all his meds and we organized/update his med calendar.  He depends largely on samples. He recently had some persistent dysphagia and was seen by [facility] Dr. Jarold Motto. 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 Rec for PPI . He was rx  Dexilant however has not picked up from pharmacy and unfortunately is not taking pepcid or zegerid/prevacid that he has  In his bag today. He says he misunderstood the instructions. I explained the reason for PPI and potential dangers of GERD.  rec He will start using his PPI meds that he has presently until Dexilant is approved at the pharmacy.  His asthma has been under control with rare use of xopenex or albuterol neb . NO ER visits. NO steroid since last ov.  He was given 3 Dulera samples .    06/13/2011 ov/Wert cc no cough or sob.  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  Using prevacid samples   08/08/2011 Wert/ cpx down to last dulera Count is 81, no other samples at  home. occ awakens with wheeze prior to am dulera but denies using saba. No purulent sputum. >>add pepcid At bedtime  , labs essentially unremarkable except elevated cholestrol.    10/03/2011 NP/ follow up of b/p. Last ov he was started on Benicar 20mg  due to elevated b/p. B/P at home has been doing well w/ avg 120-130 systolic. No chest pain , headache or dyspnea. No edema.   Breathing is at his baseline w/ no flare of  Cough or wheezing. No increased SABA use.  Dulera on 0 today .  rec  Continue on Benicar 40mg  1/2 daily -for blood pressure Check blood pressure 3 times a week in morning at rest , keep log, bring back to next visit.  follow up in 6 weeks  and As needed   Limit  Ibuprofen, aleve, etc meds and avoid sudafed (decongestant/sinus )meds  We gave you a benicar , nasonex and Dulera sample today  Low salt diet, exercise as tolerated, and weight loss.     10/25/2011 f/u ov/Wert cc c/o productive cough with green mucus  X 24 hours, acute onset, assoc  chest tightness, runny nose but no need for neb while on dulera 200 2 bid which he says he's using regularly but should be out of by our count.  Did not bring meds or calendar as requested. >>  doxcycline/steroid  rx   11/08/2011 Acute OV  Returns for persistent cough and congestion with wheezing . Seen 2 weeks ago, for asthma flare tx with doxycycline and steroid taper. Does not feel much better, still has cough, and wheezing. Coughing up thick green mucus. Rib and stomach are sore from cough. Cough is keeping him up at night. .no hemoptysis or edema.     ROS    Constitutional:   No  weight loss, night sweats,   , chills, fatigue, or  lassitude.  HEENT:   No headaches,  Difficulty swallowing,  Tooth/dental problems, or  Sore throat,                No sneezing, itching, ear ache,  ++nasal congestion, post nasal drip,   CV:  No chest pain,  Orthopnea, PND, swelling in lower extremities, anasarca, dizziness, palpitations, syncope.    GI  No heartburn, indigestion, abdominal pain, nausea, vomiting, diarrhea, change in bowel habits, loss of appetite, bloody stools.   Resp:  ,  No coughing up of blood.    No chest wall deformity  Skin: no rash or lesions.  GU: no dysuria, change in color of urine, no urgency or frequency.  No flank pain, no hematuria   MS:  No joint pain or swelling.  No decreased range of motion.  No back pain.  Psych:  No change in mood or affect. No depression or anxiety.  No memory loss.                      Objective:   Physical Exam  GEN: A/Ox3; pleasant , NAD, well nourished, overweight   Wt 208 06/13/11  > 208 08/08/2011 >>206 09/05/2011 >>202 10/03/2011 > 201 10/25/11 >>198 11/08/2011   HEENT:  [place]/AT,  EACs-clear, TMs-wnl, NOSE-clear drainage  THROAT-clear, no lesions, no postnasal drip or exudate noted., several dental caries   NECK:  Supple w/ fair ROM; no JVD; normal carotid impulses w/o bruits; no thyromegaly or nodules palpated; no lymphadenopathy.  RESP   Coarse BS w/ exp wheeze   CARD:  RRR, no m/r/g  , no peripheral edema, pulses intact, no cyanosis or clubbing.  GI:   Soft & nt; nml bowel sounds; no organomegaly or masses detected.  Musco: Warm bil, no deformities or joint swelling noted. Nl gait, no restrictions  Neuro: alert, no focal deficits noted.    Skin: Warm, no lesions or rashes              Assessment & Plan:

## 2011-11-08 NOTE — Patient Instructions (Addendum)
Levaquin 500mg  daily for 7 days  Mucinex DM Twice daily  As needed  Cough/congestion  Prednisone taper over next week.  Fluids and rest  Please contact office for sooner follow up if symptoms do not improve or worsen or seek emergency care  follow up Dr. Sherene Sires  As planned in 2 weeks and As needed

## 2011-11-18 ENCOUNTER — Encounter: Payer: Self-pay | Admitting: Internal Medicine

## 2011-11-18 ENCOUNTER — Ambulatory Visit: Payer: Medicare Other | Admitting: Internal Medicine

## 2011-11-18 ENCOUNTER — Ambulatory Visit (INDEPENDENT_AMBULATORY_CARE_PROVIDER_SITE_OTHER): Payer: Medicare Other

## 2011-11-18 ENCOUNTER — Ambulatory Visit (INDEPENDENT_AMBULATORY_CARE_PROVIDER_SITE_OTHER): Payer: Medicare Other | Admitting: Internal Medicine

## 2011-11-18 DIAGNOSIS — J309 Allergic rhinitis, unspecified: Secondary | ICD-10-CM

## 2011-11-18 DIAGNOSIS — I10 Essential (primary) hypertension: Secondary | ICD-10-CM

## 2011-11-18 DIAGNOSIS — J310 Chronic rhinitis: Secondary | ICD-10-CM

## 2011-11-18 DIAGNOSIS — J45909 Unspecified asthma, uncomplicated: Secondary | ICD-10-CM

## 2011-11-18 DIAGNOSIS — Z8619 Personal history of other infectious and parasitic diseases: Secondary | ICD-10-CM

## 2011-11-18 NOTE — Progress Notes (Signed)
Subjective:    Patient ID: [name], male    DOB: 1941/01/25, 70 y.o.   MRN: [id number]  HPI 63 yobm never smoker born prematurely with asthma since age 16s.   November 12, 2010- - Allergic asthma, Rhinitis  Nurse-CC: follow up visit-allergies.  Due to advance to 1:10 w/ next vaccineorder. Says he's doing ok.  Declines flu vax. No seasonal flare of asthma- no ER trips or prednisone. Ne recent need for either nebulizer or rescue inhaler > no change rx  December 11, 2010 ov cough better, breathing ok DULERA Count 121 > count is 39 should be 34, no sob over baseline or need for rescue.  rec no change rx   February 20, 2011 ov with count at 72 (should be 44) but cc cough/sob better.  rec no change rx  04/04/2011 NP f/u  and med review >brought all his meds and we organized/update his med calendar.  He depends largely on samples. He recently had some persistent dysphagia and was seen by [facility] Dr. Jarold Motto. 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 Rec for PPI . He was rx  Dexilant however has not picked up from pharmacy and unfortunately is not taking pepcid or zegerid/prevacid that he has  In his bag today. He says he misunderstood the instructions. I explained the reason for PPI and potential dangers of GERD.  rec He will start using his PPI meds that he has presently until Dexilant is approved at the pharmacy.  His asthma has been under control with rare use of xopenex or albuterol neb . NO ER visits. NO steroid since last ov.  He was given 3 Dulera samples .    06/13/2011 ov/Wert cc no cough or sob.  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  Using prevacid samples   08/08/2011 Wert/ cpx down to last dulera Count is 81, no other samples at  home. occ awakens with wheeze prior to am dulera but denies using saba. No purulent sputum. >>add pepcid At bedtime  , labs essentially unremarkable except elevated cholestrol.    10/03/2011 NP/ follow up of b/p. Last ov he was started on Benicar 20mg  due to elevated b/p. B/P at home has been doing well w/ avg 120-130 systolic. No chest pain , headache or dyspnea. No edema.   Breathing is at his baseline w/ no flare of  Cough or wheezing. No increased SABA use.  Dulera on 0 today .  rec  Continue on Benicar 40mg  1/2 daily -for blood pressure Check blood pressure 3 times a week in morning at rest , keep log, bring back to next visit.  follow up in 6 weeks  and As needed   Limit  Ibuprofen, aleve, etc meds and avoid sudafed (decongestant/sinus )meds  We gave you a benicar , nasonex and Dulera sample today  Low salt diet, exercise as tolerated, and weight loss.     10/25/2011 f/u ov/Wert cc c/o productive cough with green mucus  X 24 hours, acute onset, assoc  chest tightness, runny nose but no need for neb while on dulera 200 2 bid which he says he's using regularly but should be out of by our count.  Did not bring meds or calendar as requested. >>  doxcycline/steroid  rx   11/08/2011 Acute OV  Returns for persistent cough and congestion with wheezing . Seen 2 weeks ago, for asthma flare tx with doxycycline and steroid taper. Does not feel much better, still has cough, and wheezing. Coughing up thick green mucus. Rib and stomach are sore from cough.  rec Levaquin 500mg  daily for 7 days  Mucinex DM Twice daily  As needed  Cough/congestion  Prednisone taper over next week.  Fluids and rest   11/18/2011 f/u ov/Wert cc Improved since last acute visit with NP. Still c/o nasal congestion, using med calendar but not afrin, no more green mucus  Sleeping ok without nocturnal  or early am exacerbation  of respiratory  c/o's or need for noct saba. Also denies any obvious fluctuation of symptoms with  weather or environmental changes or other aggravating or alleviating factors except as outlined above   ROS  At present neg for  any significant sore throat, dysphagia, itching, sneezing,  nasal congestion or excess/ purulent secretions,  fever, chills, sweats, unintended wt loss, pleuritic or exertional cp, hempoptysis, orthopnea pnd or leg swelling.  Also denies presyncope, palpitations, heartburn, abdominal pain, nausea, vomiting, diarrhea  or change in bowel or urinary habits, dysuria,hematuria,  rash, arthralgias, visual complaints, headache, numbness weakness or ataxia.                              Objective:   Physical Exam  GEN: A/Ox3; pleasant , NAD, well nourished, overweight amb bm with childlike affect at times  Wt 208 06/13/11  >   >>202 10/03/2011 >  >> 198 11/08/2011  > 204 11/18/2011   HEENT:  [place]/AT,  EACs-clear, TMs-wnl, NOSE-clear drainage  THROAT-clear, no lesions, no postnasal drip or exudate noted., several dental caries   NECK:  Supple w/ fair ROM; no JVD; normal carotid impulses w/o bruits; no thyromegaly or nodules palpated; no lymphadenopathy.  RESP   Scattered exp wheezes late exp, no insp wheeze  CARD:  RRR, no m/r/g  , no peripheral edema, pulses intact, no cyanosis or clubbing.  GI:   Soft & nt; nml bowel sounds; no organomegaly or masses detected.  Musco: Warm bil, no deformities or joint swelling noted. Nl gait, no restrictions                 Assessment & Plan:

## 2011-11-18 NOTE — Assessment & Plan Note (Signed)
I emphasized that nasal steroids have no immediate benefit in terms of improving symptoms.  To help them reached the target tissue, the patient should use Afrin two puffs every 12 hours applied one min before using the nasal steroids.  Afrin should be stopped after no more than 5 days.  If the symptoms worsen, Afrin can be restarted after 5 days off of therapy to prevent rebound congestion from overuse of Afrin.  I also emphasized that in no way are nasal steroids a concern in terms of "addiction".  

## 2011-11-18 NOTE — Assessment & Plan Note (Signed)
All goals of chronic asthma control met including optimal(but certainly not nl) function and elimination of symptoms with minimal need for rescue therapy.  Contingencies discussed in full including contacting this office immediately if not controlling the symptoms using the rule of two's.      Each maintenance medication was reviewed in detail including most importantly the difference between maintenance and as needed and under what circumstances the prns are to be used.  This was done in the context of a medication calendar review which provided the patient with a user-friendly unambiguous mechanism for medication administration and reconciliation and provides an action plan for all active problems. It is critical that this be shown to every doctor  for modification during the office visit if necessary so the patient can use it as a working document.

## 2011-11-18 NOTE — Patient Instructions (Addendum)
I emphasized that nasal steroids (nasonex)  have no immediate benefit in terms of improving symptoms.  To help them reached the target tissue, the patient should use Afrin two puffs every 12 hours applied one min before using the nasal steroids.  Afrin should be stopped after no more than 5 days.  If the symptoms worsen, Afrin can be restarted after 5 days off of therapy to prevent rebound congestion from overuse of Afrin.  I also emphasized that in no way are nasal steroids a concern in terms of "addiction".   Only use your xopenex  as a rescue medication to be used if you can't catch your breath by resting or doing a relaxed purse lip breathing pattern. The less you use it, the better it will work when you need it.   Please schedule a follow up office visit in 4 weeks, sooner if needed    Late ADD  Dulera count is 13 and he was given 4 weeks of samples

## 2011-11-18 NOTE — Assessment & Plan Note (Signed)
Adequate control on present rx, reviewed  

## 2011-12-16 ENCOUNTER — Ambulatory Visit (INDEPENDENT_AMBULATORY_CARE_PROVIDER_SITE_OTHER): Payer: Medicare Other | Admitting: Internal Medicine

## 2011-12-16 ENCOUNTER — Ambulatory Visit (INDEPENDENT_AMBULATORY_CARE_PROVIDER_SITE_OTHER): Payer: Medicare Other

## 2011-12-16 ENCOUNTER — Telehealth: Payer: Self-pay | Admitting: Internal Medicine

## 2011-12-16 ENCOUNTER — Encounter: Payer: Self-pay | Admitting: Internal Medicine

## 2011-12-16 DIAGNOSIS — J310 Chronic rhinitis: Secondary | ICD-10-CM

## 2011-12-16 DIAGNOSIS — I10 Essential (primary) hypertension: Secondary | ICD-10-CM

## 2011-12-16 DIAGNOSIS — J45909 Unspecified asthma, uncomplicated: Secondary | ICD-10-CM

## 2011-12-16 DIAGNOSIS — J309 Allergic rhinitis, unspecified: Secondary | ICD-10-CM

## 2011-12-16 NOTE — Progress Notes (Signed)
Patient ID: [name], male   DOB: 12-Mar-1941, 70 y.o.   MRN: [id number]  Subjective:    Patient ID: [name], male    DOB: 10-Dec-1941, 70 y.o.   MRN: [id number]  HPI 47 yobm never smoker born prematurely with asthma since age 12s and documented non-adherence.  November 12, 2010- - Allergic asthma, Rhinitis  Nurse-CC: follow up visit-allergies.  Due to advance to 1:10 w/ next vaccineorder. Says he's doing ok.  Declines flu vax. No seasonal flare of asthma- no ER trips or prednisone. Ne recent need for either nebulizer or rescue inhaler > no change rx    10/03/2011 NP/ follow up of b/p. Last ov he was started on Benicar 20mg  due to elevated b/p. B/P at home has been doing well w/ avg 120-130 systolic. No chest pain , headache or dyspnea. No edema.   Breathing is at his baseline w/ no flare of  Cough or wheezing. No increased SABA use.  Dulera on 0 today .  rec  Continue on Benicar 40mg  1/2 daily -for blood pressure Check blood pressure 3 times a week in morning at rest , keep log, bring back to next visit.  follow up in 6 weeks  and As needed   Limit  Ibuprofen, aleve, etc meds and avoid sudafed (decongestant/sinus )meds  We gave you a benicar , nasonex and Dulera sample today  Low salt diet, exercise as tolerated, and weight loss.     10/25/2011 f/u ov/Wert cc c/o productive cough with green mucus  X 24 hours, acute onset, assoc  chest tightness, runny nose but no need for neb while on dulera 200 2 bid which he says he's using regularly but should be out of by our count.  Did not bring meds or calendar as requested. >>doxcycline/steroid  rx    11/18/2011 f/u ov/Wert cc Improved since last acute visit with NP. Still c/o nasal congestion, using med calendar but not afrin, no more green mucus I emphasized that nasal steroids approp rx  Only use your xopenex  as a rescue medication to be used if you can't catch your breath    Late ADD  Dulera count is 13 and he was given 4 weeks  of samples    12/16/2011 f/u ov/Wert dulera is zero and has an extra 64 hasn't started (no where close to a correct count)  cc breathing better, especially the nasal issues better with afrin plus nasonex combo. Minimal discolored sputum, denies daytime need for rescue  Sleeping ok without nocturnal  or early am exacerbation  of respiratory  c/o's or need for noct saba. Also denies any obvious fluctuation of symptoms with weather or environmental changes or other aggravating or alleviating factors except as outlined above   ROS  At present neg for  any significant sore throat, dysphagia, itching, sneezing,  nasal congestion or excess/ purulent secretions,  fever, chills, sweats, unintended wt loss, pleuritic or exertional cp, hempoptysis, orthopnea pnd or leg swelling.  Also denies presyncope, palpitations, heartburn, abdominal pain, nausea, vomiting, diarrhea  or change in bowel or urinary habits, dysuria,hematuria,  rash, arthralgias, visual complaints, headache, numbness weakness or ataxia.         Objective:   Physical Exam  GEN: A/Ox3; pleasant , NAD, well nourished, overweight amb bm with childlike affect at times  Wt 208 06/13/11  >   >>202 10/03/2011 >  >> 198 11/08/2011  > 204 11/18/2011  > 204 12/16/2011   HEENT:  [place]/AT,  EACs-clear, TMs-wnl,  NOSE-clear drainage  THROAT-clear, no lesions, no postnasal drip or exudate noted., several dental caries   NECK:  Supple w/ fair ROM; no JVD; normal carotid impulses w/o bruits; no thyromegaly or nodules palpated; no lymphadenopathy.  RESP   Scattered min exp wheezes late exp, no insp wheeze  CARD:  RRR, no m/r/g  , no peripheral edema, pulses intact, no cyanosis or clubbing.  GI:   Soft & nt; nml bowel sounds; no organomegaly or masses detected.  Musco: Warm bil, no deformities or joint swelling noted. Nl gait, no restrictions                 Assessment & Plan:

## 2011-12-16 NOTE — Telephone Encounter (Signed)
Per MW- the pt should increase benicar to 1 whole tablet daily, rather than the 1/2 tablet he has been taking.  Spoke with pt and notified of this, and advised that he avoid salt and monitor his BP at home and let us know if gets too low. Pt verbalized understanding and denied any questions. He will keep planned followup.

## 2011-12-16 NOTE — Patient Instructions (Addendum)
dulera 200 Take 2 puffs first thing in am and then another 2 puffs about 12 hours later.    Your count is zero today - when the count reaches zero throw the inhaler   See calendar for specific medication instructions and bring it back for each and every office visit for every healthcare provider you see.  Without it,  you may not receive the best quality medical care that we feel you deserve.  You will note that the calendar groups together  your maintenance  medications that are timed at particular times of the day.  Think of this as your checklist for what your doctor has instructed you to do until your next evaluation to see what benefit  there is  to staying on a consistent group of medications intended to keep you well.  The other group at the bottom is entirely up to you to use as you see fit  for specific symptoms that may arise between visits that require you to treat them on an as needed basis.  Think of this as your action plan or "what if" list.   Separating the top medications from the bottom group is fundamental to providing you adequate care going forward.    Please schedule a follow up office visit in 4 weeks, sooner if needed

## 2011-12-16 NOTE — Assessment & Plan Note (Addendum)
Not  Adequate control on present rx, reviewed need to remember to take meds and avoid salt and monitor

## 2011-12-16 NOTE — Assessment & Plan Note (Signed)
Doing better with application of nasal steroids, reviewed

## 2011-12-16 NOTE — Progress Notes (Deleted)
Patient ID: Diane Mochizuki, male   DOB: [birth year], 70 y.o.   MRN: [id number]  Subjective:    Patient ID: Jaja Switalski, male    DOB: 07-13-1941, 70 y.o.   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   November 12, 2010- - Allergic asthma, Rhinitis  Nurse-CC: follow up visit-allergies.  Due to advance to 1:10 w/ next vaccineorder. Says he's doing ok.  Declines flu vax. No seasonal flare of asthma- no ER trips or prednisone. Ne recent need for either nebulizer or rescue inhaler > no change rx  December 11, 2010 ov cough better, breathing ok DULERA Count 121 > count is 39 should be 34, no sob over baseline or need for rescue.  rec no change rx   February 20, 2011 ov with count at 72 (should be 44) but cc cough/sob better.  rec no change rx  04/04/2011 NP f/u  and med review >brought all his meds and we organized/update his med calendar.  He depends largely on samples. He recently had some persistent dysphagia and was seen by [facility] Dr. Jarold Motto. 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 Rec for PPI . He was rx  Dexilant however has not picked up from pharmacy and unfortunately is not taking pepcid or zegerid/prevacid that he has  In his bag today. He says he misunderstood the instructions. I explained the reason for PPI and potential dangers of GERD.  rec He will start using his PPI meds that he has presently until Dexilant is approved at the pharmacy.  His asthma has been under control with rare use of xopenex or albuterol neb . NO ER visits. NO steroid since last ov.  He was given 3 Dulera samples .    06/13/2011 ov/Wert cc no cough or sob.  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  Using prevacid samples    08/08/2011 Wert/ cpx down to last dulera Count is 81, no other samples at home. occ awakens with wheeze prior to am dulera but denies using saba. No purulent sputum. >>add pepcid At bedtime  , labs essentially unremarkable except elevated cholestrol.    10/03/2011 NP/ follow up of b/p. Last ov he was started on Benicar 20mg  due to elevated b/p. B/P at home has been doing well w/ avg 120-130 systolic. No chest pain , headache or dyspnea. No edema.   Breathing is at his baseline w/ no flare of  Cough or wheezing. No increased SABA use.  Dulera on 0 today .  rec  Continue on Benicar 40mg  1/2 daily -for blood pressure Check blood pressure 3 times a week in morning at rest , keep log, bring back to next visit.  follow up in 6 weeks  and As needed   Limit  Ibuprofen, aleve, etc meds and avoid sudafed (decongestant/sinus )meds  We gave you a benicar , nasonex and Dulera sample today  Low salt diet, exercise as tolerated, and weight loss.     10/25/2011 f/u ov/Wert cc c/o productive cough with green mucus  X 24 hours, acute onset, assoc  chest tightness, runny nose but no need for neb while on dulera 200 2 bid which he says he's using regularly but  should be out of by our count.  Did not bring meds or calendar as requested. >>doxcycline/steroid  rx   11/08/2011 Acute OV  Returns for persistent cough and congestion with wheezing . Seen 2 weeks ago, for asthma flare tx with doxycycline and steroid taper. Does not feel much better, still has cough, and wheezing. Coughing up thick green mucus. Rib and stomach are sore from cough.  rec Levaquin 500mg  daily for 7 days  Mucinex DM Twice daily  As needed  Cough/congestion  Prednisone taper over next week.  Fluids and rest   12/16/2011 f/u ov/Wert cc Improved since last acute visit with NP. Still c/o nasal congestion, using med calendar but not afrin, no more green mucus  Sleeping ok without nocturnal  or early am exacerbation  of respiratory  c/o's  or need for noct saba. Also denies any obvious fluctuation of symptoms with weather or environmental changes or other aggravating or alleviating factors except as outlined above   ROS  At present neg for  any significant sore throat, dysphagia, itching, sneezing,  nasal congestion or excess/ purulent secretions,  fever, chills, sweats, unintended wt loss, pleuritic or exertional cp, hempoptysis, orthopnea pnd or leg swelling.  Also denies presyncope, palpitations, heartburn, abdominal pain, nausea, vomiting, diarrhea  or change in bowel or urinary habits, dysuria,hematuria,  rash, arthralgias, visual complaints, headache, numbness weakness or ataxia.                              Objective:   Physical Exam  GEN: A/Ox3; pleasant , NAD, well nourished, overweight amb bm with childlike affect at times  Wt 208 06/13/11  >   >>202 10/03/2011 >  >> 198 11/08/2011  > 204 12/16/2011   HEENT:  [place]/AT,  EACs-clear, TMs-wnl, NOSE-clear drainage  THROAT-clear, no lesions, no postnasal drip or exudate noted., several dental caries   NECK:  Supple w/ fair ROM; no JVD; normal carotid impulses w/o bruits; no thyromegaly or nodules palpated; no lymphadenopathy.  RESP   Scattered exp wheezes late exp, no insp wheeze  CARD:  RRR, no m/r/g  , no peripheral edema, pulses intact, no cyanosis or clubbing.  GI:   Soft & nt; nml bowel sounds; no organomegaly or masses detected.  Musco: Warm bil, no deformities or joint swelling noted. Nl gait, no restrictions                 Assessment & Plan:

## 2011-12-16 NOTE — Assessment & Plan Note (Signed)
DDX of  difficult airways managment all start with A and  include Adherence, Ace Inhibitors, Acid Reflux, Active Sinus Disease, Alpha 1 Antitripsin deficiency, Anxiety masquerading as Airways dz,  ABPA,  allergy(esp in young), Aspiration (esp in elderly), Adverse effects of DPI,  Active smokers, plus two Bs  = Bronchiectasis and Beta blocker use..and one C= CHF  Adherence is always the initial "prime suspect" and is a multilayered concern that requires a "trust but verify" approach in every patient - starting with knowing how to use medications, especially inhalers, correctly, keeping up with refills and understanding the fundamental difference between maintenance and prns vs those medications only taken for a very short course and then stopped and not refilled. Continues to struggle with concept of maint vs prn and miscount/ misuse dulera.    Each maintenance medication was reviewed in detail including most importantly the difference between maintenance and as needed and under what circumstances the prns are to be used. This was done in the context of a medication calendar review which provided the patient with a user-friendly unambiguous mechanism for medication administration and reconciliation and provides an action plan for all active problems. It is critical that this be shown to every doctor  for modification during the office visit if necessary so the patient can use it as a working document.    ?Active sinus dz > better with use of afrin x 5 days before nasal steroids for flares

## 2012-01-02 ENCOUNTER — Ambulatory Visit (INDEPENDENT_AMBULATORY_CARE_PROVIDER_SITE_OTHER): Payer: Medicare Other

## 2012-01-02 DIAGNOSIS — J309 Allergic rhinitis, unspecified: Secondary | ICD-10-CM | POA: Diagnosis not present

## 2012-01-14 ENCOUNTER — Ambulatory Visit (INDEPENDENT_AMBULATORY_CARE_PROVIDER_SITE_OTHER): Payer: Medicare Other | Admitting: Internal Medicine

## 2012-01-14 ENCOUNTER — Ambulatory Visit (INDEPENDENT_AMBULATORY_CARE_PROVIDER_SITE_OTHER): Payer: Medicare Other

## 2012-01-14 ENCOUNTER — Encounter: Payer: Self-pay | Admitting: Internal Medicine

## 2012-01-14 DIAGNOSIS — J309 Allergic rhinitis, unspecified: Secondary | ICD-10-CM

## 2012-01-14 DIAGNOSIS — J45909 Unspecified asthma, uncomplicated: Secondary | ICD-10-CM | POA: Diagnosis not present

## 2012-01-14 NOTE — Progress Notes (Signed)
Patient ID: [name], male   DOB: 1941/11/23, 71 y.o.   MRN: [id number]  Subjective:    Patient ID: [name], male    DOB: 15-Dec-1941   MRN: [id number]  HPI 55 yobm never smoker born prematurely with asthma since age 33s and documented non-adherence.  November 12, 2010- - Allergic asthma, Rhinitis  Nurse-CC: follow up visit-allergies.  Due to advance to 1:10 w/ next vaccineorder. Says he's doing ok.  Declines flu vax. No seasonal flare of asthma- no ER trips or prednisone. Ne recent need for either nebulizer or rescue inhaler > no change rx    10/03/2011 NP/ follow up of b/p. Last ov he was started on Benicar 20mg  due to elevated b/p. B/P at home has been doing well w/ avg 120-130 systolic. No chest pain , headache or dyspnea. No edema.   Breathing is at his baseline w/ no flare of  Cough or wheezing. No increased SABA use.  Dulera on 0 today .  rec  Continue on Benicar 40mg  1/2 daily -for blood pressure Check blood pressure 3 times a week in morning at rest , keep log, bring back to next visit.  follow up in 6 weeks  and As needed   Limit  Ibuprofen, aleve, etc meds and avoid sudafed (decongestant/sinus )meds  We gave you a benicar , nasonex and Dulera sample today  Low salt diet, exercise as tolerated, and weight loss.     10/25/2011 f/u ov/[name] Sleep cc c/o productive cough with green mucus  X 24 hours, acute onset, assoc  chest tightness, runny nose but no need for neb while on dulera 200 2 bid which he says he's using regularly but should be out of by our count.  Did not bring meds or calendar as requested. >>doxcycline/steroid  rx    11/18/2011 f/u ov/[name] cc Improved since last acute visit with NP. Still c/o nasal congestion, using med calendar but not afrin, no more green mucus I emphasized that nasal steroids approp rx  Only use your xopenex  as a rescue medication to be used if you can't catch your breath    Late ADD  Dulera count is 13 and he was given 4 weeks of  samples    12/16/2011 f/u ov/[name] dulera is zero and has an extra 64 hasn't started (no where close to a correct count)  cc breathing better, especially the nasal issues better with afrin plus nasonex combo. Minimal discolored sputum, denies daytime need for rescue rec dulera 200 Take 2 puffs first thing in am and then another 2 puffs about 12 hours later.   Your count is zero today - when the count reaches zero throw the inhaler  See calendar for specific medication instructions Please schedule a follow up office visit in 4 weeks, sooner if needed    01/14/2012 f/u ov/[name] dulera 36 last one cc breathing fine, not needing hfa saba but also not in his pocket as requested - never needs the neb   Sleeping ok without nocturnal  or early am exacerbation  of respiratory  c/o's or need for noct saba. Also denies any obvious fluctuation of symptoms with weather or environmental changes or other aggravating or alleviating factors except as outlined above   ROS  At present neg for  any significant sore throat, dysphagia, itching, sneezing,  nasal congestion or excess/ purulent secretions,  fever, chills, sweats, unintended wt loss, pleuritic or exertional cp, hempoptysis, orthopnea pnd or leg swelling.  Also denies presyncope, palpitations, heartburn,  abdominal pain, nausea, vomiting, diarrhea  or change in bowel or urinary habits, dysuria,hematuria,  rash, arthralgias, visual complaints, headache, numbness weakness or ataxia.         Objective:   Physical Exam  GEN: A/Ox3; pleasant , NAD, well nourished, overweight amb bm with childlike affect at times  Wt 208 06/13/11  > 204 11/18/2011  > 204 12/16/2011 > 01/14/2012  208  HEENT:  [place]/AT,  EACs-clear, TMs-wnl, NOSE-clear drainage  THROAT-clear, no lesions, no postnasal drip or exudate noted., several dental caries   NECK:  Supple w/ fair ROM; no JVD; normal carotid impulses w/o bruits; no thyromegaly or nodules palpated; no  lymphadenopathy.  RESP   Scattered min exp wheezes late exp, no insp wheeze  CARD:  RRR, no m/r/g  , no peripheral edema, pulses intact, no cyanosis or clubbing.  GI:   Soft & nt; nml bowel sounds; no organomegaly or masses detected.  Musco: Warm bil, no deformities or joint swelling noted. Nl gait, no restrictions                 Assessment & Plan:

## 2012-01-14 NOTE — Patient Instructions (Addendum)
See calendar for specific medication instructions and bring it back for each and every office visit for every healthcare provider you see.  Without it,  you may not receive the best quality medical care that we feel you deserve.  You will note that the calendar groups together  your maintenance  medications that are timed at particular times of the day.  Think of this as your checklist for what your doctor has instructed you to do until your next evaluation to see what benefit  there is  to staying on a consistent group of medications intended to keep you well.  The other group at the bottom is entirely up to you to use as you see fit  for specific symptoms that may arise between visits that require you to treat them on an as needed basis.  Think of this as your action plan or "what if" list.   Separating the top medications from the bottom group is fundamental to providing you adequate care going forward.    Please schedule a follow up office visit in 4 weeks, sooner if needed   LATE ADD last dulera =36, 2 samples of 60 each given

## 2012-01-14 NOTE — Progress Notes (Deleted)
Patient ID: [name], male   DOB: 12-06-1941, 71 y.o.   MRN: [id number]  Subjective:    Patient ID: [name], male    DOB: [birth year]    MRN: [id number]  HPI 11 yobm never smoker born prematurely with asthma since age 69s and documented non-adherence with best fev1 around 2 liters  November 12, 2010- - Allergic asthma, Rhinitis  Nurse-CC: follow up visit-allergies.  Due to advance to 1:10 w/ next vaccineorder. Says he's doing ok.  Declines flu vax. No seasonal flare of asthma- no ER trips or prednisone. Ne recent need for either nebulizer or rescue inhaler > no change rx    10/03/2011 NP/ follow up of b/p. Last ov he was started on Benicar 20mg  due to elevated b/p. B/P at home has been doing well w/ avg 120-130 systolic. No chest pain , headache or dyspnea. No edema.   Breathing is at his baseline w/ no flare of  Cough or wheezing. No increased SABA use.  Dulera on 0 today .  rec  Continue on Benicar 40mg  1/2 daily -for blood pressure Check blood pressure 3 times a week in morning at rest , keep log, bring back to next visit.  follow up in 6 weeks  and As needed   Limit  Ibuprofen, aleve, etc meds and avoid sudafed (decongestant/sinus )meds  We gave you a benicar , nasonex and Dulera sample today  Low salt diet, exercise as tolerated, and weight loss.     10/25/2011 f/u ov/Marium Ragan cc c/o productive cough with green mucus  X 24 hours, acute onset, assoc  chest tightness, runny nose but no need for neb while on dulera 200 2 bid which he says he's using regularly but should be out of by our count.  Did not bring meds or calendar as requested. >>doxcycline/steroid  rx    11/18/2011 f/u ov/Zarinah Oviatt cc Improved since last acute visit with NP. Still c/o nasal congestion, using med calendar but not afrin, no more green mucus I emphasized that nasal steroids approp rx  Only use your xopenex  as a rescue medication to be used if you can't catch your breath    Late ADD  Dulera count is 13  and he was given 4 weeks of samples    01/14/2012 f/u ov/Shivan Hodes dulera is zero and has an extra 64 hasn't started (no where close to a correct count)  cc breathing better, especially the nasal issues better with afrin plus nasonex combo. Minimal discolored sputum, denies daytime need for rescue  Sleeping ok without nocturnal  or early am exacerbation  of respiratory  c/o's or need for noct saba. Also denies any obvious fluctuation of symptoms with weather or environmental changes or other aggravating or alleviating factors except as outlined above   ROS  At present neg for  any significant sore throat, dysphagia, itching, sneezing,  nasal congestion or excess/ purulent secretions,  fever, chills, sweats, unintended wt loss, pleuritic or exertional cp, hempoptysis, orthopnea pnd or leg swelling.  Also denies presyncope, palpitations, heartburn, abdominal pain, nausea, vomiting, diarrhea  or change in bowel or urinary habits, dysuria,hematuria,  rash, arthralgias, visual complaints, headache, numbness weakness or ataxia.         Objective:   Physical Exam  GEN: A/Ox3; pleasant , NAD, well nourished, overweight amb bm with childlike affect at times  Wt 208 06/13/11  >   >>202 10/03/2011 >  >> 198 11/08/2011  > 204 11/18/2011  > 204 01/14/2012   HEENT:  [place]/AT,  EACs-clear, TMs-wnl, NOSE-clear drainage  THROAT-clear, no lesions, no postnasal drip or exudate noted., several dental caries   NECK:  Supple w/ fair ROM; no JVD; normal carotid impulses w/o bruits; no thyromegaly or nodules palpated; no lymphadenopathy.  RESP   Scattered min exp wheezes late exp, no insp wheeze  CARD:  RRR, no m/r/g  , no peripheral edema, pulses intact, no cyanosis or clubbing.  GI:   Soft & nt; nml bowel sounds; no organomegaly or masses detected.  Musco: Warm bil, no deformities or joint swelling noted. Nl gait, no restrictions                 Assessment & Plan:

## 2012-01-15 ENCOUNTER — Encounter: Payer: Self-pay | Admitting: Internal Medicine

## 2012-01-15 NOTE — Assessment & Plan Note (Signed)
DDX of  difficult airways managment all start with A and  include Adherence, Ace Inhibitors, Acid Reflux, Active Sinus Disease, Alpha 1 Antitripsin deficiency, Anxiety masquerading as Airways dz,  ABPA,  allergy(esp in young), Aspiration (esp in elderly), Adverse effects of DPI,  Active smokers, plus two Bs  = Bronchiectasis and Beta blocker use..and one C= CHF  Adherence is always the initial "prime suspect" and is a multilayered concern that requires a "trust but verify" approach in every patient - starting with knowing how to use medications, especially inhalers, correctly, keeping up with refills and understanding the fundamental difference between maintenance and prns vs those medications only taken for a very short course and then stopped and not refilled. His dulera does not add up to consistent 2 bid dosing but he's doing well on present rx.         Each maintenance medication was reviewed in detail including most importantly the difference between maintenance and as needed and under what circumstances the prns are to be used. This was done in the context of a medication calendar review which provided the patient with a user-friendly unambiguous mechanism for medication administration and reconciliation and provides an action plan for all active problems. It is critical that this be shown to every doctor  for modification during the office visit if necessary so the patient can use it as a working document.

## 2012-01-30 ENCOUNTER — Ambulatory Visit (INDEPENDENT_AMBULATORY_CARE_PROVIDER_SITE_OTHER): Payer: Medicare Other

## 2012-01-30 DIAGNOSIS — J309 Allergic rhinitis, unspecified: Secondary | ICD-10-CM

## 2012-02-12 ENCOUNTER — Ambulatory Visit: Payer: Medicare Other | Admitting: Internal Medicine

## 2012-02-20 ENCOUNTER — Ambulatory Visit (INDEPENDENT_AMBULATORY_CARE_PROVIDER_SITE_OTHER): Payer: Medicare Other

## 2012-02-20 DIAGNOSIS — J309 Allergic rhinitis, unspecified: Secondary | ICD-10-CM

## 2012-02-27 ENCOUNTER — Ambulatory Visit (INDEPENDENT_AMBULATORY_CARE_PROVIDER_SITE_OTHER): Payer: Medicare Other

## 2012-02-27 ENCOUNTER — Ambulatory Visit (INDEPENDENT_AMBULATORY_CARE_PROVIDER_SITE_OTHER): Payer: Medicare Other | Admitting: Internal Medicine

## 2012-02-27 ENCOUNTER — Encounter: Payer: Self-pay | Admitting: Internal Medicine

## 2012-02-27 DIAGNOSIS — J45909 Unspecified asthma, uncomplicated: Secondary | ICD-10-CM

## 2012-02-27 DIAGNOSIS — J309 Allergic rhinitis, unspecified: Secondary | ICD-10-CM | POA: Diagnosis not present

## 2012-02-27 DIAGNOSIS — I10 Essential (primary) hypertension: Secondary | ICD-10-CM | POA: Diagnosis not present

## 2012-02-27 NOTE — Patient Instructions (Signed)
See calendar for specific medication instructions and bring it back for each and every office visit for every healthcare provider you see.  Without it,  you may not receive the best quality medical care that we feel you deserve.  You will note that the calendar groups together  your maintenance  medications that are timed at particular times of the day.  Think of this as your checklist for what your doctor has instructed you to do until your next evaluation to see what benefit  there is  to staying on a consistent group of medications intended to keep you well.  The other group at the bottom is entirely up to you to use as you see fit  for specific symptoms that may arise between visits that require you to treat them on an as needed basis.  Think of this as your action plan or "what if" list.   Separating the top medications from the bottom group is fundamental to providing you adequate care going forward.   Please schedule a follow up office visit in 4 weeks, sooner if needed for CPX

## 2012-02-27 NOTE — Progress Notes (Signed)
Subjective:    Patient ID: [name], male    DOB: 1941/04/23   MRN: [id number]  HPI 57 yobm never smoker born prematurely with asthma since age 2s and documented non-adherence.  November 12, 2010- - Allergic asthma, Rhinitis  Nurse-CC: follow up visit-allergies.  Due to advance to 1:10 w/ next vaccineorder. Says he's doing ok.  Declines flu vax. No seasonal flare of asthma- no ER trips or prednisone. Ne recent need for either nebulizer or rescue inhaler > no change rx    10/03/2011 NP/ follow up of b/p. Last ov he was started on Benicar 20mg  due to elevated b/p. B/P at home has been doing well w/ avg 120-130 systolic. No chest pain , headache or dyspnea. No edema.   Breathing is at his baseline w/ no flare of  Cough or wheezing. No increased SABA use.  Dulera on 0 today .  rec  Continue on Benicar 40mg  1/2 daily -for blood pressure Check blood pressure 3 times a week in morning at rest , keep log, bring back to next visit.  follow up in 6 weeks  and As needed   Limit  Ibuprofen, aleve, etc meds and avoid sudafed (decongestant/sinus )meds  We gave you a benicar , nasonex and Dulera sample today  Low salt diet, exercise as tolerated, and weight loss.     10/25/2011 f/u ov/[name] cc c/o productive cough with green mucus  X 24 hours, acute onset, assoc  chest tightness, runny nose but no need for neb while on dulera 200 2 bid which he says he's using regularly but should be out of by our count.  Did not bring meds or calendar as requested. >>doxcycline/steroid  rx    11/18/2011 f/u ov/[name] cc Improved since last acute visit with NP. Still c/o nasal congestion, using med calendar but not afrin, no more green mucus I emphasized that nasal steroids approp rx  Only use your xopenex  as a rescue medication to be used if you can't catch your breath    Late ADD  Dulera count is 13 and he was given 4 weeks of samples    12/16/2011 f/u ov/[name] dulera is zero and has an extra 64 hasn't  started (no where close to a correct count)  cc breathing better, especially the nasal issues better with afrin plus nasonex combo. Minimal discolored sputum, denies daytime need for rescue rec dulera 200 Take 2 puffs first thing in am and then another 2 puffs about 12 hours later.   Your count is zero today - when the count reaches zero throw the inhaler  See calendar for specific medication instructions Please schedule a follow up office visit in 4 weeks, sooner if needed    01/14/2012 f/u ov/[name] dulera 36 last one cc breathing fine, not needing hfa saba but also not in his pocket as requested - never needs the neb rec See calendar for specific medication instructions  LATE ADD last dulera =36, 2 samples of 60 each given  02/27/2012 f/u ov/[name] cc no change doe, no cough, doing better with correlation with counter but still off by calendar by about 20 puffs.  Sleeping ok without nocturnal  or early am exacerbation  of respiratory  c/o's or need for noct saba. Also denies any obvious fluctuation of symptoms with weather or environmental changes or other aggravating or alleviating factors except as outlined above   ROS  At present neg for  any significant sore throat, dysphagia, itching, sneezing,  nasal congestion  or excess/ purulent secretions,  fever, chills, sweats, unintended wt loss, pleuritic or exertional cp, hempoptysis, orthopnea pnd or leg swelling.  Also denies presyncope, palpitations, heartburn, abdominal pain, nausea, vomiting, diarrhea  or change in bowel or urinary habits, dysuria,hematuria,  rash, arthralgias, visual complaints, headache, numbness weakness or ataxia.         Objective:   Physical Exam  GEN: A/Ox3; pleasant , NAD, well nourished, overweight amb bm with childlike affect at times  Wt 208 06/13/11  > 204 11/18/2011  > 204 12/16/2011 > 01/14/2012  208 > 205 02/27/2012   HEENT:  Bee/AT,  EACs-clear, TMs-wnl, NOSE-clear drainage  THROAT-clear, no lesions, no  postnasal drip or exudate noted., several dental caries   NECK:  Supple w/ fair ROM; no JVD; normal carotid impulses w/o bruits; no thyromegaly or nodules palpated; no lymphadenopathy.  RESP   Scattered min exp wheezes late exp, no insp wheeze  CARD:  RRR, no m/r/g  , no peripheral edema, pulses intact, no cyanosis or clubbing.  GI:   Soft & nt; nml bowel sounds; no organomegaly or masses detected.  Musco: Warm bil, no deformities or joint swelling noted. Nl gait, no restrictions                 Assessment & Plan:

## 2012-03-01 NOTE — Assessment & Plan Note (Signed)
-  chronic severe asthma with best FEV1 2.2 L documented 2/202 and documented non-adherence Mainly lives off samples of inhalers and nose sprays - HFA 75% June 15, 2009 > 90% August 02, 2009 > 100% December 27, 2009  - Off allergy vaccines x7/2010 > highly allergic skin tests per Dr Maple Hudson > restart vaccine November 06, 2009  - Spirometry on "good day" 01/14/12 =  FEV1 1.54  Ratio 58  All goals of chronic asthma control met including optimal function(but unfortunately no where near nl) and elimination of symptoms with minimal need for rescue therapy.  Contingencies discussed in full including contacting this office immediately if not controlling the symptoms using the rule of two's.     Each maintenance medication was reviewed in detail including most importantly the difference between maintenance and as needed and under what circumstances the prns are to be used. This was done in the context of a medication calendar review which provided the patient with a user-friendly unambiguous mechanism for medication administration and reconciliation and provides an action plan for all active problems. It is critical that this be shown to every doctor  for modification during the office visit if necessary so the patient can use it as a working document.

## 2012-03-01 NOTE — Assessment & Plan Note (Signed)
BP Readings from Last 3 Encounters:  02/27/12 130/78  01/14/12 118/74  12/16/11 152/98     Adequate control on present rx, reviewed

## 2012-03-10 ENCOUNTER — Encounter: Payer: Self-pay | Admitting: Internal Medicine

## 2012-03-13 ENCOUNTER — Ambulatory Visit (INDEPENDENT_AMBULATORY_CARE_PROVIDER_SITE_OTHER): Payer: Medicare Other

## 2012-03-13 DIAGNOSIS — J309 Allergic rhinitis, unspecified: Secondary | ICD-10-CM

## 2012-03-25 DIAGNOSIS — H40029 Open angle with borderline findings, high risk, unspecified eye: Secondary | ICD-10-CM | POA: Diagnosis not present

## 2012-03-25 DIAGNOSIS — H04219 Epiphora due to excess lacrimation, unspecified lacrimal gland: Secondary | ICD-10-CM | POA: Diagnosis not present

## 2012-03-26 ENCOUNTER — Ambulatory Visit (INDEPENDENT_AMBULATORY_CARE_PROVIDER_SITE_OTHER): Payer: Medicare Other | Admitting: Internal Medicine

## 2012-03-26 ENCOUNTER — Other Ambulatory Visit (INDEPENDENT_AMBULATORY_CARE_PROVIDER_SITE_OTHER): Payer: Medicare Other

## 2012-03-26 ENCOUNTER — Ambulatory Visit (INDEPENDENT_AMBULATORY_CARE_PROVIDER_SITE_OTHER): Payer: Medicare Other

## 2012-03-26 ENCOUNTER — Encounter: Payer: Self-pay | Admitting: Internal Medicine

## 2012-03-26 VITALS — BP 132/82 | HR 66 | Temp 97.4°F | Ht 68.0 in | Wt 203.6 lb

## 2012-03-26 DIAGNOSIS — J310 Chronic rhinitis: Secondary | ICD-10-CM | POA: Diagnosis not present

## 2012-03-26 DIAGNOSIS — J309 Allergic rhinitis, unspecified: Secondary | ICD-10-CM

## 2012-03-26 DIAGNOSIS — I10 Essential (primary) hypertension: Secondary | ICD-10-CM | POA: Diagnosis not present

## 2012-03-26 DIAGNOSIS — E785 Hyperlipidemia, unspecified: Secondary | ICD-10-CM

## 2012-03-26 DIAGNOSIS — J45909 Unspecified asthma, uncomplicated: Secondary | ICD-10-CM

## 2012-03-26 DIAGNOSIS — Z86010 Personal history of colonic polyps: Secondary | ICD-10-CM

## 2012-03-26 LAB — CBC WITH DIFFERENTIAL/PLATELET
Basophils Absolute: 0.1 10*3/uL (ref 0.0–0.1)
Eosinophils Absolute: 0.3 10*3/uL (ref 0.0–0.7)
HCT: 41.8 % (ref 39.0–52.0)
Hemoglobin: 13.9 g/dL (ref 13.0–17.0)
Lymphs Abs: 3.3 10*3/uL (ref 0.7–4.0)
MCHC: 33.1 g/dL (ref 30.0–36.0)
MCV: 88.6 fl (ref 78.0–100.0)
Monocytes Absolute: 0.6 10*3/uL (ref 0.1–1.0)
Monocytes Relative: 7 % (ref 3.0–12.0)
Neutro Abs: 4.5 10*3/uL (ref 1.4–7.7)
RDW: 13.8 % (ref 11.5–14.6)

## 2012-03-26 LAB — HEPATIC FUNCTION PANEL
ALT: 15 U/L (ref 0–53)
Albumin: 3.8 g/dL (ref 3.5–5.2)
Total Protein: 7.1 g/dL (ref 6.0–8.3)

## 2012-03-26 LAB — BASIC METABOLIC PANEL
CO2: 28 mEq/L (ref 19–32)
Calcium: 9.1 mg/dL (ref 8.4–10.5)
Creatinine, Ser: 1.3 mg/dL (ref 0.4–1.5)
GFR: 67.41 mL/min (ref >60.00)
Sodium: 144 mEq/L (ref 135–145)

## 2012-03-26 LAB — URINALYSIS
Leukocytes, UA: NEGATIVE
Specific Gravity, Urine: 1.015 (ref 1.000–1.030)
Urobilinogen, UA: 0.2 (ref 0.0–1.0)

## 2012-03-26 LAB — LIPID PANEL
Cholesterol: 216 mg/dL — ABNORMAL HIGH (ref 0–200)
Triglycerides: 131 mg/dL (ref 0.0–149.0)

## 2012-03-26 NOTE — Progress Notes (Signed)
  Subjective:    Patient ID: [name], male    DOB: February 03, 1941, 71 y.o.   MRN: [id number]  HPI  78 yobm denies ever smoking but has lifelong asthma with severe chronic patter and best FEV1 of around 2.2 liters documented 2002 followed in pulmonary for primary care also for hbp and hyperlipidemia.  03/27/12 CPX scheduled but not due until 08/07/2012.  Continues to c/o cough but no excess mucus, no change doe or need for daytime rescue or overt sinus or reflux symtpoms  Denies using rescue therapy at all, seems to be keeping up with his meds better.  ASTHMA (ICD-493.90)  --chronic severe asthma with best FEV1 2.2 L documented 01/2001 and  documented non-adherence Mainly lives off samples of inhalers and nose sprays  - HFA 75% June 15, 2009 > 90% August 02, 2009 > 100% December 27, 2009  - Off allergy vaccines x7/2010 > highly allergic skin tests per Dr Maple Hudson > restart vaccine November 06, 2009  SHOULDER PAIN, RIGHT (ICD-719.41)   HYPERTENSION, BORDERLINE (ICD-401.9)  Shingles T 8 Left 9/01  HEALTH MAINTENANCE...............................................................................................Marland KitchenWert  - Colonoscopy 12/1989  - dT 03/2005  - Pneumovax 3/04 and 05/2009  -Flu declined October 17, 2008 and September 21, 2009  -CPX 03/27/12 - Meds reviewed with pt education and computerized med calendar adjusted October 19, 2009, February 20, 2010, September 10, 2010    Review of Systems  Constitutional: Negative for fever, chills, diaphoresis, activity change, appetite change, fatigue and unexpected weight change.  HENT: Negative for hearing loss, ear pain, nosebleeds, congestion, sore throat, facial swelling, rhinorrhea, sneezing, mouth sores, trouble swallowing, neck pain, neck stiffness, dental problem, voice change, postnasal drip, sinus pressure, tinnitus and ear discharge.   Eyes: Negative for photophobia, discharge, itching and visual disturbance.  Respiratory: Positive for cough.  Negative for apnea, choking, chest tightness, shortness of breath, wheezing and stridor.   Cardiovascular: Negative for chest pain, palpitations and leg swelling.  Gastrointestinal: Negative for nausea, vomiting, abdominal pain, constipation, blood in stool and abdominal distention.  Genitourinary: Negative for dysuria, urgency, frequency, hematuria, flank pain, decreased urine volume and difficulty urinating.  Musculoskeletal: Positive for back pain. Negative for myalgias, joint swelling, arthralgias and gait problem.  Skin: Negative for color change, pallor and rash.  Neurological: Negative for dizziness, tremors, seizures, syncope, speech difficulty, weakness, light-headedness, numbness and headaches.  Hematological: Negative for adenopathy. Does not bruise/bleed easily.  Psychiatric/Behavioral: Negative for confusion, sleep disturbance and agitation. The patient is not nervous/anxious.        Objective:   Physical Exam  amb bm nad Wt 203  03/27/12 HEENT: nl dentition, turbinates, and orophanx. Nl external ear canals without cough reflex   NECK :  without JVD/Nodes/TM/ nl carotid upstrokes bilaterally   LUNGS: no acc muscle use, hyperresonant to percussion, mod increased T exp without cough on insp or exp maneuvers   CV:  RRR  no s3 or murmur or increase in P2, no edema   ABD:  soft and nontender with nl excursion in the supine position. No bruits or organomegaly, bowel sounds nl  MS:  warm without deformities, nl gait, nocalf tenderness, cyanosis or clubbing  SKIN: warm and dry without lesions    NEURO:  alert, approp, no deficits on motor or cerebellar testing  GU  Testes down bilaterally, no IH, no nodules  Uncirc  Rectal mod bph, smooth texture, stool g neg          Assessment & Plan:

## 2012-03-26 NOTE — Patient Instructions (Signed)
See calendar for specific medication instructions and bring it back for each and every office visit for every healthcare provider you see.  Without it,  you may not receive the best quality medical care that we feel you deserve.  You will note that the calendar groups together  your maintenance  medications that are timed at particular times of the day.  Think of this as your checklist for what your doctor has instructed you to do until your next evaluation to see what benefit  there is  to staying on a consistent group of medications intended to keep you well.  The other group at the bottom is entirely up to you to use as you see fit  for specific symptoms that may arise between visits that require you to treat them on an as needed basis.  Think of this as your action plan or "what if" list.   Separating the top medications from the bottom group is fundamental to providing you adequate care going forward.   Please remember to go to the lab   department downstairs for your tests - we will call you with the results when they are available.     Please schedule a follow up office visit in 4 weeks, sooner if needed

## 2012-03-29 NOTE — Assessment & Plan Note (Signed)
--  chronic severe asthma with best FEV1 2.2 L documented 2/202 and documented non-adherence Mainly lives off samples of inhalers and nose sprays - HFA 75% June 15, 2009 > 90% August 02, 2009 > 100% December 27, 2009  - Off allergy vaccines x7/2010 > highly allergic skin tests per Dr Maple Hudson > restart vaccine November 06, 2009  - Spirometry on "good day" 01/14/12 =  FEV1 1.54  Ratio 58  All goals of chronic asthma control met including optimal (but not nl)function and elimination of symptoms with minimal need for rescue therapy.  Contingencies discussed in full including contacting this office immediately if not controlling the symptoms using the rule of two's.

## 2012-03-29 NOTE — Assessment & Plan Note (Signed)
Adequate control on present rx, reviewed  

## 2012-03-29 NOTE — Assessment & Plan Note (Signed)
-   Target LDL < 130 due to h/o hbp/ male gender  Adequate control on present rx, reviewed   Lab Results  Component Value Date   LDLCALC 101* 05/31/2009

## 2012-03-30 ENCOUNTER — Telehealth: Payer: Self-pay | Admitting: Internal Medicine

## 2012-03-30 NOTE — Telephone Encounter (Signed)
Pt aware of lab work results

## 2012-03-30 NOTE — Progress Notes (Signed)
Quick Note:  Pt aware of results, no questions or concerns at this time. ______

## 2012-04-24 ENCOUNTER — Encounter: Payer: Self-pay | Admitting: Internal Medicine

## 2012-04-24 ENCOUNTER — Ambulatory Visit (INDEPENDENT_AMBULATORY_CARE_PROVIDER_SITE_OTHER): Payer: Medicare Other | Admitting: Internal Medicine

## 2012-04-24 ENCOUNTER — Ambulatory Visit (INDEPENDENT_AMBULATORY_CARE_PROVIDER_SITE_OTHER): Payer: Medicare Other

## 2012-04-24 VITALS — BP 142/78 | HR 71 | Temp 98.1°F | Ht 68.0 in | Wt 202.8 lb

## 2012-04-24 DIAGNOSIS — J309 Allergic rhinitis, unspecified: Secondary | ICD-10-CM

## 2012-04-24 DIAGNOSIS — J45909 Unspecified asthma, uncomplicated: Secondary | ICD-10-CM | POA: Diagnosis not present

## 2012-04-24 NOTE — Patient Instructions (Addendum)
See [name] in 4  weeks with all your medications, even over the counter meds, separated in two separate bags, the ones you take no matter what vs the ones you stop once you feel better and take only as needed when you feel you need them.   [name]  will generate for you a new user friendly medication calendar that will put [place] all on the same page re: your medication use.     Without this process, it simply isn't possible to assure that we are providing  your outpatient care  with  the attention to detail we feel you deserve.   If we cannot assure that you're getting that kind of care,  then we cannot manage your problem effectively from this clinic.  Once you have seen [name] and we are sure that we're all on the same page with your medication use she will arrange follow up with me.    Late add: dulera counter says 118 " it's the only one I got and I got it here)

## 2012-04-24 NOTE — Progress Notes (Signed)
  Subjective:    Patient ID: [name]    DOB: [birth year] .   MRN: [id number]    Brief patient profile: 37 yobm denies ever smoking but has lifelong asthma with severe chronic patter and best FEV1 of around 2.2 liters documented 2002 followed in pulmonary for primary care also for hbp and hyperlipidemia.   Hx: 03/27/12 CPX scheduled but not due until 08/07/2012.  Continues to c/o cough but no excess mucus, no change doe or need for daytime rescue or overt sinus or reflux symtpoms Denies using rescue therapy at all, seems to be keeping up with his meds better. rec Follow calendar  04/24/2012 f/u ov/ Caralyn Twining, doing fine but lost calendar and has two empty inhalers with saba and one dulera with count 118. No purulent sputum. Denies needing saba daytime. No overt hb or sinus complaints.  Sleeping ok without nocturnal  or early am exacerbation  of respiratory  c/o's or need for noct saba. Also denies any obvious fluctuation of symptoms with weather or environmental changes or other aggravating or alleviating factors except as outlined above     Past Medical Hx ASTHMA (ICD-493.90)  --chronic severe asthma with best FEV1 2.2 L documented 01/2001 and  documented non-adherence Mainly lives off samples of inhalers and nose sprays  - HFA 75% June 15, 2009 > 90% August 02, 2009 > 100% December 27, 2009  - Off allergy vaccines x7/2010 > highly allergic skin tests per Dr Maple Hudson > restarted vaccine November 06, 2009  SHOULDER PAIN, RIGHT (ICD-719.41)   HYPERTENSION, BORDERLINE (ICD-401.9)  Shingles T 8 Left 9/01  HEALTH MAINTENANCE...............................................................................................Marland KitchenWert  - Colonoscopy 12/1989  - dT 03/2005  - Pneumovax 3/04 and 05/2009  -Flu declined October 17, 2008 and September 21, 2009  -CPX 03/27/12 - Meds reviewed with pt education and computerized med calendar adjusted October 19, 2009, February 20, 2010, September 10, 2010    Fm Hx         Objective:   Physical Exam  amb bm nad Wt 203  03/27/12 > 04/24/2012  202  HEENT: nl dentition, turbinates, and orophanx. Nl external ear canals without cough reflex   NECK :  without JVD/Nodes/TM/ nl carotid upstrokes bilaterally   LUNGS: no acc muscle use, hyperresonant to percussion, mod increased T exp without cough on insp or exp maneuvers   CV:  RRR  no s3 or murmur or increase in P2, no edema   ABD:  soft and nontender with nl excursion in the supine position. No bruits or organomegaly, bowel sounds nl  MS:  warm without deformities, nl gait, nocalf tenderness, cyanosis or clubbing            Assessment & Plan:

## 2012-04-25 NOTE — Assessment & Plan Note (Signed)
--  chronic severe asthma with best FEV1 2.2 L documented 2/202 and documented non-adherence Mainly lives off samples of inhalers and nose sprays - HFA 75% June 15, 2009 > 90% August 02, 2009 > 100% December 27, 2009  - Off allergy vaccines  06/2009 > highly allergic skin tests per Dr Maple Hudson > restart vaccine November 06, 2009  - Spirometry on "good day" 01/14/12 =  FEV1 1.54  Ratio 58  Severe chronic allergic asthma complicated by consistent non-adherence - not able to verify today he understands the difference between dulera and his rescue, not clear why he has two saba's nearly empty and one dulera completely full  I had an extended discussion with the patient today lasting 15 to 20 minutes of a 25 minute visit on the following issues:   Each maintenance medication was reviewed in detail including most importantly the difference between maintenance and as needed and under what circumstances the prns are to be used.  Please see instructions for details which were reviewed in writing and the patient given a copy.

## 2012-05-15 ENCOUNTER — Ambulatory Visit (INDEPENDENT_AMBULATORY_CARE_PROVIDER_SITE_OTHER): Payer: Medicare Other

## 2012-05-15 DIAGNOSIS — J309 Allergic rhinitis, unspecified: Secondary | ICD-10-CM | POA: Diagnosis not present

## 2012-05-22 ENCOUNTER — Encounter: Payer: Self-pay | Admitting: Adult Health

## 2012-05-22 ENCOUNTER — Ambulatory Visit (INDEPENDENT_AMBULATORY_CARE_PROVIDER_SITE_OTHER): Payer: Medicare Other | Admitting: Adult Health

## 2012-05-22 ENCOUNTER — Ambulatory Visit (INDEPENDENT_AMBULATORY_CARE_PROVIDER_SITE_OTHER): Payer: Medicare Other

## 2012-05-22 VITALS — BP 126/88 | HR 75 | Temp 97.0°F | Ht 68.0 in | Wt 205.0 lb

## 2012-05-22 DIAGNOSIS — J45909 Unspecified asthma, uncomplicated: Secondary | ICD-10-CM | POA: Diagnosis not present

## 2012-05-22 DIAGNOSIS — J309 Allergic rhinitis, unspecified: Secondary | ICD-10-CM | POA: Diagnosis not present

## 2012-05-22 NOTE — Patient Instructions (Signed)
Follow up in 4 weeks with Wert and As needed   Follow med calenadar closely and bring to each visit.  Low salt diet, exercise as tolerated, and weight loss.  Please contact office for sooner follow up if symptoms do not improve or worsen or seek emergency care

## 2012-05-22 NOTE — Assessment & Plan Note (Signed)
Controlled on rx  Patient's medications were reviewed today and patient education was given. Computerized medication calendar was adjusted/completed follow up Dr. Sherene Sires  In  4weeks  Samples given.  Dulera on 44 , 2 dulera samples given

## 2012-05-22 NOTE — Progress Notes (Signed)
Subjective:    Patient ID: [name]    DOB: 17-Jan-1941 .   MRN: [id number]    Brief patient profile: 31 yobm denies ever smoking but has lifelong asthma with severe chronic patter and best FEV1 of around 2.2 liters documented 2002 followed in pulmonary for primary care also for hbp and hyperlipidemia.   Hx: 03/27/12 CPX scheduled but not due until 08/07/2012.  Continues to c/o cough but no excess mucus, no change doe or need for daytime rescue or overt sinus or reflux symtpoms Denies using rescue therapy at all, seems to be keeping up with his meds better. rec Follow calendar  04/24/2012 f/u ov/ wert, doing fine but lost calendar and has two empty inhalers with saba and one dulera with count 118. No purulent sputum. Denies needing saba daytime. No overt hb or sinus complaints. >>no changes   05/22/2012 Follow up and med review  Returns for follow up and med review.  We reviewed her meds and organized them into his med calendar w/ pt education  He depends on samples quite a bit.  No flare in asthma. No increased SABA use .  No nocturnal symptoms or ER visits.       Past Medical Hx ASTHMA (ICD-493.90)  --chronic severe asthma with best FEV1 2.2 L documented 01/2001 and  documented non-adherence Mainly lives off samples of inhalers and nose sprays  - HFA 75% June 15, 2009 > 90% August 02, 2009 > 100% December 27, 2009  - Off allergy vaccines x7/2010 > highly allergic skin tests per Dr Maple Hudson > restarted vaccine November 06, 2009  SHOULDER PAIN, RIGHT (ICD-719.41)   HYPERTENSION, BORDERLINE (ICD-401.9)  Shingles T 8 Left 9/01  HEALTH MAINTENANCE...............................................................................................Marland KitchenWert  - Colonoscopy 12/1989 , 2011  - dT 03/2005  - Pneumovax 3/04 and 05/2009  -Flu declined October 17, 2008 and September 21, 2009  -CPX 03/27/12 - Meds reviewed with pt education and computerized med calendar adjusted October 19, 2009, February 20, 2010, September 10, 2010 , 05/22/2012       ROS:  Constitutional:   No  weight loss, night sweats,  Fevers, chills, fatigue, or  lassitude.  HEENT:   No headaches,  Difficulty swallowing,  Tooth/dental problems, or  Sore throat,                No sneezing, itching, ear ache, nasal congestion, post nasal drip,   CV:  No chest pain,  Orthopnea, PND, swelling in lower extremities, anasarca, dizziness, palpitations, syncope.   GI  No heartburn, indigestion, abdominal pain, nausea, vomiting, diarrhea, change in bowel habits, loss of appetite, bloody stools.   Resp: No shortness of breath with exertion or at rest.  No excess mucus, no productive cough,  No non-productive cough,  No coughing up of blood.  No change in color of mucus.  No wheezing.  No chest wall deformity  Skin: no rash or lesions.  GU: no dysuria, change in color of urine, no urgency or frequency.  No flank pain, no hematuria   MS:  No joint pain or swelling.  No decreased range of motion.  No back pain.  Psych:  No change in mood or affect. No depression or anxiety.  No memory loss.            Objective:   Physical Exam  amb bm nad Wt 203  03/27/12 > 04/24/2012  202 >205 05/22/2012   HEENT: nl dentition, turbinates, and orophanx. Nl external ear canals without cough reflex  NECK :  without JVD/Nodes/TM/ nl carotid upstrokes bilaterally   LUNGS: no acc muscle use, hyperresonant to percussion, mod increased T exp without cough on insp or exp maneuvers   CV:  RRR  no s3 or murmur or increase in P2, no edema   ABD:  soft and nontender with nl excursion in the supine position. No bruits or organomegaly, bowel sounds nl  MS:  warm without deformities, nl gait, nocalf tenderness, cyanosis or clubbing            Assessment & Plan:

## 2012-06-18 ENCOUNTER — Ambulatory Visit (INDEPENDENT_AMBULATORY_CARE_PROVIDER_SITE_OTHER): Payer: Medicare Other

## 2012-06-18 DIAGNOSIS — J309 Allergic rhinitis, unspecified: Secondary | ICD-10-CM

## 2012-06-23 ENCOUNTER — Encounter: Payer: Self-pay | Admitting: Internal Medicine

## 2012-06-23 ENCOUNTER — Ambulatory Visit (INDEPENDENT_AMBULATORY_CARE_PROVIDER_SITE_OTHER): Payer: Medicare Other | Admitting: Internal Medicine

## 2012-06-23 ENCOUNTER — Ambulatory Visit (INDEPENDENT_AMBULATORY_CARE_PROVIDER_SITE_OTHER): Payer: Medicare Other

## 2012-06-23 VITALS — BP 120/86 | HR 65 | Ht 68.0 in | Wt 201.2 lb

## 2012-06-23 DIAGNOSIS — J309 Allergic rhinitis, unspecified: Secondary | ICD-10-CM | POA: Diagnosis not present

## 2012-06-23 DIAGNOSIS — J302 Other seasonal allergic rhinitis: Secondary | ICD-10-CM

## 2012-06-23 DIAGNOSIS — J45909 Unspecified asthma, uncomplicated: Secondary | ICD-10-CM

## 2012-06-23 DIAGNOSIS — J45998 Other asthma: Secondary | ICD-10-CM

## 2012-06-23 NOTE — Patient Instructions (Addendum)
We will continue your allergy vaccine shots for another year, then see what we think at next year's visit.   Continue seeing Dr Sherene Sires for primary asthma management

## 2012-06-23 NOTE — Progress Notes (Signed)
Subjective:    Patient ID: [name], male    DOB: 12-18-1941, 71 y.o.   MRN: [id number]  HPI 69yobm never smoker born prematurely with asthma since age 80s.   January 11, 2010- Allergic asthma  Doing ok building allergy vaccine here. No reactions or problems. Asthma is "as good as it gets" now. Somedays he is clear with no wheeze, then weather changes and he may wheeze a little bit. It doesn't keep him awake or keep him from doing what he wants to do. Sometimes productive cough.   May 16, 2010- Allergic asthma, Rhinitis  Returning for allergy f/u. Sees Dr Sherene Sires regularly for primary pulmonary.  He has built allergy vaccine here to maintenance at 1:50 with no reactions. He says rhinitis and asthma have done well through the Spring pollen season. Needs refill Nasonex.  Last used Xopenex rescue inhaler 2-3 days ago.  Denies nasal congestion, sneeze, blowing or headache.   November 12, 2010- - Allergic asthma, Rhinitis  Nurse-CC: follow up visit-allergies.  Due to advance to 1:10 w/ next vaccineorder. Says he's doing ok.  Declines flu vax. No seasonal flare of asthma- no ER trips or prednisone. Ne recent need for either nebulizer or rescue inhaler  November 19, 2010 ov cc cough and congestion a little worse x sev days.  December 11, 2010 ov cough better, breathing ok DULERA Count 121 > count is 39 should be 34, no sob over baseline or need for rescue.  rec no change   January 09, 2011 ov cough and sob better. dulera count 64 denies rescue need or noct complaints.   February 20, 2011 ov with count at 72 (should be 44) but cc cough/sob better.    04/04/2011 Follow up and med review.  PT returns for 6 week follow up and med review. He has brought all his meds and we organized/update his med calendar.  He depends largely on samples. He recently had some persistent dysphagia and was seen by [facility] Dr. Jarold Motto. He underwent  endo 03/20/11 -esophageal stricture-s/p dilatation, ulcerated  stricture-bx was c/w gerd -neg for barretts. Rec for PPI . He was rx  Dexilant however has not picked up from pharmacy and unfortunately is not taking pepcid or zegerid/prevacid that he has  In his bag today. He says he misunderstood the instructions. I explained the reason for PPI and potential dangers of GERD.  He will start using his PPI meds that he has presently until Dexilant is approved at the pharmacy.  His asthma has been under control with rare use of xopenex or albuterol neb . NO ER visits. NO steroid since last ov.  He was given 3 Dulera samples today.   6/06/24/11- 57 yo never smoker followed for adult onset Allergic asthma, Rhinitis, complicated by GERD/ stricture. Continues f/u for allergy component on allergy vaccine at 1:10, GH. The combination of his therapies has him better off than he used to be, but he isn't sure which is most important. Denies reactions to his allergy shots.  We reviewed meds and role of allergy vaccine in his treatment plan.   06/23/12-  37 yo never smoker followed for adult onset asthma (Dr Sherene Sires), Allergy Maple Hudson), Rhinitis, complicated by GERD/ stricture. Seen for 1 year allergy f/u. Asthma control has been better and more stable. He says he was not particularly bothered by spring pollen season or the very wet weather this year. He has continued allergy vaccine here regularly at 1:10 without problems. He began in 2010  and we discussed usual recommendation that he try to continue 3-5 years.  ROS-see HPI Constitutional:   No-   weight loss, night sweats, fevers, chills, fatigue, lassitude. HEENT:   No-  headaches, difficulty swallowing, tooth/dental problems, sore throat,       No-  sneezing, itching, ear ache, nasal congestion, post nasal drip,  CV:  No-   chest pain, orthopnea, PND, swelling in lower extremities, anasarca,  dizziness, palpitations Resp: No-   shortness of breath with exertion or at rest.              No-   productive cough,  No  non-productive cough,  No- coughing up of blood.              No-   change in color of mucus.  No- wheezing.   Skin: No-   rash or lesions. GI:  No-   heartburn, indigestion, abdominal pain, nausea, vomiting,  GU: . MS:  No-   joint pain or swelling.  . Neuro-     nothing unusual Psych:  No- change in mood or affect. No depression or anxiety.  No memory loss.  OBJ- Physical Exam General- Alert, Oriented, Affect-appropriate, Distress- none acute Skin- rash-none, lesions- none, excoriation- none Lymphadenopathy- none Head- atraumatic            Eyes- Gross vision intact, PERRLA, conjunctivae and secretions clear            Ears- Hearing, canals-normal            Nose- Clear, no-Septal dev, mucus, polyps, erosion, perforation             Throat- Mallampati II , mucosa clear , drainage- none, tonsils- atrophic Neck- flexible , trachea midline, no stridor , thyroid nl, carotid no bruit Chest - symmetrical excursion , unlabored           Heart/CV- RRR , no murmur , no gallop  , no rub, nl s1 s2                           - JVD- none , edema- none, stasis changes- none, varices- none           Lung- + coarse breath sounds- almost a light wheeze, unlabored, cough- none , dullness-none, rub- none           Chest wall-  Abd-  Br/ Gen/ Rectal- Not done, not indicated Extrem- cyanosis- none, clubbing, none, atrophy- none, strength- nl Neuro- grossly intact to observation Assessment & Plan:

## 2012-06-24 ENCOUNTER — Ambulatory Visit (INDEPENDENT_AMBULATORY_CARE_PROVIDER_SITE_OTHER): Payer: Medicare Other | Admitting: Internal Medicine

## 2012-06-24 ENCOUNTER — Encounter: Payer: Self-pay | Admitting: Internal Medicine

## 2012-06-24 VITALS — BP 130/80 | HR 70 | Temp 98.1°F

## 2012-06-24 DIAGNOSIS — J45909 Unspecified asthma, uncomplicated: Secondary | ICD-10-CM

## 2012-06-24 NOTE — Assessment & Plan Note (Signed)
-  chronic severe asthma with best FEV1 2.2 L documented 2/202 and documented non-adherence Mainly lives off samples of inhalers and nose sprays - HFA 75% June 15, 2009 > 90% August 02, 2009 > 100% December 27, 2009  - Off allergy vaccines x7/2010 > highly allergic skin tests per Dr Maple Hudson > restart vaccine November 06, 2009  - Spirometry on "good day" 01/14/12 =  FEV1 1.54  Ratio 58  All goals of chronic asthma control met including optimal function and elimination of symptoms with minimal need for rescue therapy.  Contingencies discussed in full including contacting this office immediately if not controlling the symptoms using the rule of two's.       Each maintenance medication was reviewed in detail including most importantly the difference between maintenance and as needed and under what circumstances the prns are to be used. This was done in the context of a medication calendar review which provided the patient with a user-friendly unambiguous mechanism for medication administration and reconciliation and provides an action plan for all active problems. It is critical that this be shown to every doctor  for modification during the office visit if necessary so the patient can use it as a working document.

## 2012-06-24 NOTE — Progress Notes (Signed)
Subjective:    Patient ID: [name]    DOB: 03-01-1941 .   MRN: [id number]    Brief patient profile:  71 yobm denies ever smoking but has lifelong asthma with severe chronic pattern and best FEV1 of around 2.2 liters documented 2002 followed in pulmonary for primary care also for hbp and hyperlipidemia.   Hx: 03/27/12 CPX scheduled but not due until 08/07/2012.  Continues to c/o cough but no excess mucus, no change doe or need for daytime rescue or overt sinus or reflux symtpoms Denies using rescue therapy at all, seems to be keeping up with his meds better. rec Follow calendar  04/24/2012 f/u ov/ wert, doing fine but lost calendar and has two empty inhalers with saba and one dulera with count 118. No purulent sputum. Denies needing saba daytime. No overt hb or sinus complaints. >>no changes   05/22/2012 Follow up and med review / NP ov Returns for follow up and med review.  We reviewed her meds and organized them into his med calendar w/ pt education  He depends on samples quite a bit.  No flare in asthma. No increased SABA use .  No nocturnal symptoms or ER visits.  rec Follow up in 4 weeks with Wert and As needed   Follow med calenadar closely and bring to each visit.  Low salt diet, exercise as tolerated, and weight loss.  06/24/2012 f/u ov/ Wert following med calendar cc breathing better than usual and no need for daytime saba at all.  No unusual cough, purulent sputum or sinus/hb symptoms on present rx.     Sleeping ok without nocturnal  or early am exacerbation  of respiratory  c/o's or need for noct saba. Also denies any obvious fluctuation of symptoms with weather or environmental changes or other aggravating or alleviating factors except as outlined above        Past Medical Hx ASTHMA (ICD-493.90)  --chronic severe asthma with best FEV1 2.2 L documented 01/2001 and  documented non-adherence Mainly lives off samples of inhalers and nose sprays  - HFA 75% June 15, 2009 > 90% August 02, 2009 > 100% December 27, 2009  - Off allergy vaccines x7/2010 > highly allergic skin tests per Dr Maple Hudson > restarted vaccine November 06, 2009  SHOULDER PAIN, RIGHT (ICD-719.41)   HYPERTENSION, BORDERLINE (ICD-401.9)  Shingles T 8 Left 9/01  HEALTH MAINTENANCE...............................................................................................Marland KitchenWert  - Colonoscopy 12/1989 , 2011  - dT 03/2005  - Pneumovax 3/04 and 05/2009  -Flu declined October 17, 2008 and September 21, 2009  -CPX 03/27/12 - Meds reviewed with pt education and computerized med calendar adjusted October 19, 2009, February 20, 2010, September 10, 2010 , 05/22/2012            Objective:   Physical Exam  amb bm nad  Wt 203  03/27/12 > 04/24/2012  202 >205 05/22/2012 > 06/24/2012  201  HEENT: nl dentition, turbinates, and orophanx. Nl external ear canals without cough reflex   NECK :  without JVD/Nodes/TM/ nl carotid upstrokes bilaterally   LUNGS: no acc muscle use, hyperresonant to percussion, mod increased T exp without cough on insp or exp maneuvers   CV:  RRR  no s3 or murmur or increase in P2, no edema   ABD:  soft and nontender with nl excursion in the supine position. No bruits or organomegaly, bowel sounds nl  MS:  warm without deformities, nl gait, nocalf tenderness, cyanosis or clubbing  Assessment & Plan:

## 2012-06-24 NOTE — Patient Instructions (Addendum)

## 2012-06-28 NOTE — Assessment & Plan Note (Signed)
More stable control. Dr.Wert as worked hard with him on medication compliance. Given that his allergic rhinitis symptoms have been well-controlled this spring during the bases and when many people have a lot of trouble, I suspect his allergy vaccine has also contributed to his better asthma management. When possible, continue allergy vaccine for 3-5 years seems to leave at least some people with lasting hyposensitization. He is willing to continue at least another year.

## 2012-06-28 NOTE — Assessment & Plan Note (Signed)
Rhinitis symptoms have been well controlled this spring.

## 2012-07-16 ENCOUNTER — Ambulatory Visit (INDEPENDENT_AMBULATORY_CARE_PROVIDER_SITE_OTHER): Payer: Medicare Other

## 2012-07-16 DIAGNOSIS — J309 Allergic rhinitis, unspecified: Secondary | ICD-10-CM

## 2012-08-07 ENCOUNTER — Ambulatory Visit (INDEPENDENT_AMBULATORY_CARE_PROVIDER_SITE_OTHER): Payer: Medicare Other

## 2012-08-07 DIAGNOSIS — J309 Allergic rhinitis, unspecified: Secondary | ICD-10-CM | POA: Diagnosis not present

## 2012-08-28 ENCOUNTER — Encounter: Payer: Self-pay | Admitting: Internal Medicine

## 2012-08-28 ENCOUNTER — Telehealth: Payer: Self-pay | Admitting: Internal Medicine

## 2012-08-28 ENCOUNTER — Ambulatory Visit (INDEPENDENT_AMBULATORY_CARE_PROVIDER_SITE_OTHER): Payer: Medicare Other | Admitting: Internal Medicine

## 2012-08-28 VITALS — BP 138/80 | HR 72 | Ht 68.0 in | Wt 200.6 lb

## 2012-08-28 DIAGNOSIS — J309 Allergic rhinitis, unspecified: Secondary | ICD-10-CM

## 2012-08-28 DIAGNOSIS — J45909 Unspecified asthma, uncomplicated: Secondary | ICD-10-CM

## 2012-08-28 DIAGNOSIS — J45998 Other asthma: Secondary | ICD-10-CM

## 2012-08-28 DIAGNOSIS — J302 Other seasonal allergic rhinitis: Secondary | ICD-10-CM

## 2012-08-28 MED ORDER — MOMETASONE FUROATE 50 MCG/ACT NA SUSP: 1.0000 | Freq: Two times a day (BID) | NASAL | Status: DC

## 2012-08-28 MED ORDER — METHYLPREDNISOLONE ACETATE 80 MG/ML IJ SUSP
80.0000 mg | Freq: Once | INTRAMUSCULAR | Status: AC
  Administered 2012-08-28: 80 mg via INTRAMUSCULAR

## 2012-08-28 NOTE — Patient Instructions (Addendum)
Depo 80  Continue your usual meds, including your nebulizer if you need it.  Nasonex refill script sent

## 2012-08-28 NOTE — Progress Notes (Signed)
Subjective:    Patient ID: [name], male    DOB: Sep 04, 1941, 71 y.o.   MRN: [id number]  HPI 71yobm never smoker born prematurely with asthma since age 38s.   January 11, 2010- Allergic asthma  Doing ok building allergy vaccine here. No reactions or problems. Asthma is "as good as it gets" now. Somedays he is clear with no wheeze, then weather changes and he may wheeze a little bit. It doesn't keep him awake or keep him from doing what he wants to do. Sometimes productive cough.   May 16, 2010- Allergic asthma, Rhinitis  Returning for allergy f/u. Sees Dr [name] regularly for primary pulmonary.  He has built allergy vaccine here to maintenance at 1:50 with no reactions. He says rhinitis and asthma have done well through the Spring pollen season. Needs refill Nasonex.  Last used Xopenex rescue inhaler 2-3 days ago.  Denies nasal congestion, sneeze, blowing or headache.   November 12, 2010- - Allergic asthma, Rhinitis  Nurse-CC: follow up visit-allergies.  Due to advance to 1:10 w/ next vaccineorder. Says he's doing ok.  Declines flu vax. No seasonal flare of asthma- no ER trips or prednisone. Ne recent need for either nebulizer or rescue inhaler  November 19, 2010 ov cc cough and congestion a little worse x sev days.  December 11, 2010 ov cough better, breathing ok DULERA Count 121 > count is 39 should be 34, no sob over baseline or need for rescue.  rec no change   January 09, 2011 ov cough and sob better. dulera count 64 denies rescue need or noct complaints.   February 20, 2011 ov with count at 72 (should be 44) but cc cough/sob better.    04/04/2011 Follow up and med review.  PT returns for 6 week follow up and med review. He has brought all his meds and we organized/update his med calendar.  He depends largely on samples. He recently had some persistent dysphagia and was seen by [facility] Dr. Jarold Jacobson. He underwent  endo 03/20/11 -esophageal stricture-s/p dilatation, ulcerated  stricture-bx was c/w gerd -neg for barretts. Rec for PPI . He was rx  Dexilant however has not picked up from pharmacy and unfortunately is not taking pepcid or zegerid/prevacid that he has  In his bag today. He says he misunderstood the instructions. I explained the reason for PPI and potential dangers of GERD.  He will start using his PPI meds that he has presently until Dexilant is approved at the pharmacy.  His asthma has been under control with rare use of xopenex or albuterol neb . NO ER visits. NO steroid since last ov.  He was given 3 Dulera samples today.   6/06/24/11- 71 yo never smoker followed for adult onset Allergic asthma, Rhinitis, complicated by GERD/ stricture. Continues f/u for allergy component on allergy vaccine at 1:10, GH. The combination of his therapies has him better off than he used to be, but he isn't sure which is most important. Denies reactions to his allergy shots.  We reviewed meds and role of allergy vaccine in his treatment plan.   06/23/12-  71 yo never smoker followed for adult onset asthma (Dr [name]), Allergy [name]), Rhinitis, complicated by GERD/ stricture. Seen for 1 year allergy f/u. Asthma control has been better and more stable. He says he was not particularly bothered by spring pollen season or the very wet weather this year. He has continued allergy vaccine here regularly at 1:10 without problems. He began in 2010  and we discussed usual recommendation that he try to continue 3-5 years.  08/28/12-  71 yo never smoker followed for adult onset asthma (Dr [name]), Allergy [name]), Rhinitis, complicated by GERD/ stricture. ACUTE VISIT: increased chest congestion and slight drainage in throat; cough-clear in color. Took neb tx this morning to help break up congestion. Overnight onset of wheeze cough and phlegm-white. Denies fever sweats or sore throat. Didn't recognize a reflux event.  ROS-see HPI Constitutional:   No-   weight loss, night sweats, fevers, chills,  fatigue, lassitude. HEENT:   No-  headaches, difficulty swallowing, tooth/dental problems, sore throat,       No-  sneezing, itching, ear ache, nasal congestion, post nasal drip,  CV:  No-   chest pain, orthopnea, PND, swelling in lower extremities, anasarca,  dizziness, palpitations Resp: No-   shortness of breath with exertion or at rest.              +productive cough,  + non-productive cough,  No- coughing up of blood.              No-   change in color of mucus.  + wheezing.   Skin: No-   rash or lesions. GI:  No-   heartburn, indigestion, abdominal pain, nausea, vomiting,  GU: . MS:  No-   joint pain or swelling.  . Neuro-     nothing unusual Psych:  No- change in mood or affect. No depression or anxiety.  No memory loss.  OBJ- Physical Exam General- Alert, Oriented, Affect-appropriate, Distress- none acute Skin- rash-none, lesions- none, excoriation- none Lymphadenopathy- none Head- atraumatic            Eyes- Gross vision intact, PERRLA, conjunctivae and secretions clear            Ears- Hearing, canals-normal            Nose- Clear, no-Septal dev, mucus, polyps, erosion, perforation             Throat- Mallampati II , mucosa clear , drainage- none, tonsils- atrophic Neck- flexible , trachea midline, no stridor , thyroid nl, carotid no bruit Chest - symmetrical excursion , unlabored           Heart/CV- RRR , no murmur , no gallop  , no rub, nl s1 s2                           - JVD- none , edema- none, stasis changes- none, varices- none           Lung- +  wheeze, unlabored, cough- none , dullness-none, rub- none           Chest wall-  Abd-  Br/ Gen/ Rectal- Not done, not indicated Extrem- cyanosis- none, clubbing, none, atrophy- none, strength- nl Neuro- grossly intact to observation Assessment & Plan:

## 2012-08-28 NOTE — Telephone Encounter (Signed)
I spoke with pt and offered him to come in and be seen 1st. He was fine with coming in today and is scheduled to see Dr. Maple Hudson at 3:00 for acute visit. I offered VS but either wanted to see CDY, TP OR MW. Nothing further was needed

## 2012-09-06 NOTE — Assessment & Plan Note (Addendum)
Acute exacerbation of asthma with bronchitis. An abrupt onset like this would most likely be viral unless he has refluxed. Plan-Depo-Medrol, continue routine meds

## 2012-10-02 ENCOUNTER — Ambulatory Visit (INDEPENDENT_AMBULATORY_CARE_PROVIDER_SITE_OTHER): Payer: Medicare Other | Admitting: Internal Medicine

## 2012-10-02 ENCOUNTER — Encounter: Payer: Self-pay | Admitting: Internal Medicine

## 2012-10-02 ENCOUNTER — Ambulatory Visit (INDEPENDENT_AMBULATORY_CARE_PROVIDER_SITE_OTHER): Payer: Medicare Other

## 2012-10-02 VITALS — BP 118/78 | HR 77 | Temp 97.7°F | Ht 68.0 in | Wt 193.4 lb

## 2012-10-02 DIAGNOSIS — J45909 Unspecified asthma, uncomplicated: Secondary | ICD-10-CM

## 2012-10-02 DIAGNOSIS — J309 Allergic rhinitis, unspecified: Secondary | ICD-10-CM | POA: Diagnosis not present

## 2012-10-02 NOTE — Assessment & Plan Note (Signed)
--  chronic severe asthma with best FEV1 2.2 L documented 2/202 and documented non-adherence Mainly lives off samples of inhalers and nose sprays - HFA 75% June 15, 2009 > 90% August 02, 2009 > 100% December 27, 2009  - Off allergy vaccines x7/2010 > highly allergic skin tests per Dr Maple Hudson > restart vaccine November 06, 2009  - Spirometry on "good day" 01/14/12 =  FEV1 1.54  Ratio 0.58  All goals of chronic asthma control met including optimal function (but no where near nl) and elimination of symptoms with minimal need for rescue therapy.  Contingencies discussed in full including contacting this office immediately if not controlling the symptoms using the rule of two's.       Each maintenance medication was reviewed in detail including most importantly the difference between maintenance and as needed and under what circumstances the prns are to be used. This was done in the context of a medication calendar review which provided the patient with a user-friendly unambiguous mechanism for medication administration and reconciliation and provides an action plan for all active problems. It is critical that this be shown to every doctor  for modification during the office visit if necessary so the patient can use it as a working document.

## 2012-10-02 NOTE — Progress Notes (Signed)
Subjective:    Patient ID: [name]    DOB: March 06, 1941 .   MRN: [id number]    Brief patient profile:  71 yobm denies ever smoking but has lifelong asthma with severe chronic pattern and best FEV1 of around 2.2 liters documented 2002 followed in pulmonary for primary care also for hbp and hyperlipidemia.   Hx: 03/27/12 CPX scheduled but not due until 08/07/2012.  Continues to c/o cough but no excess mucus, no change doe or need for daytime rescue or overt sinus or reflux symtpoms Denies using rescue therapy at all, seems to be keeping up with his meds better. rec Follow calendar  04/24/2012 f/u ov/ [name], doing fine but lost calendar and has two empty inhalers with saba and one dulera with count 118. No purulent sputum. Denies needing saba daytime. No overt hb or sinus complaints. >>no changes   05/22/2012 Follow up and med review / NP ov Returns for follow up and med review.  We reviewed her meds and organized them into his med calendar w/ pt education  He depends on samples quite a bit.  No flare in asthma. No increased SABA use .  No nocturnal symptoms or ER visits.  rec Follow up in 4 weeks with [name] and As needed   Follow med calenadar closely and bring to each visit.  Low salt diet, exercise as tolerated, and weight loss.  06/24/2012 f/u ov/ [name] following med calendar cc breathing better than usual and no need for daytime saba at all.    rec No change meds, use med calendar   10/02/2012 f/u ov/[name] cc breathing better, rare daytime saba, No obvious daytime variabilty or assoc chronic cough or cp or chest tightness, subjective wheeze overt sinus or hb symptoms. No unusual exp hx  Sleeping ok without nocturnal  or early am exacerbation  of respiratory  c/o's or need for noct saba. Also denies any obvious fluctuation of symptoms with weather or environmental changes or other aggravating or alleviating factors except as outlined above.  ROS  The following are not active  complaints unless bolded sore throat, dysphagia, dental problems, itching, sneezing,  nasal congestion or excess/ purulent secretions, ear ache,   fever, chills, sweats, unintended wt loss, pleuritic or exertional cp, hemoptysis,  orthopnea pnd or leg swelling, presyncope, palpitations, heartburn, abdominal pain, anorexia, nausea, vomiting, diarrhea  or change in bowel or urinary habits, change in stools or urine, dysuria,hematuria,  rash, arthralgias, visual complaints, headache, numbness weakness or ataxia or problems with walking or coordination,  change in mood/affect or memory.           Past Medical Hx ASTHMA (ICD-493.90)  --chronic severe asthma with best FEV1 2.2 L documented 01/2001 and  documented non-adherence Mainly lives off samples of inhalers and nose sprays  - HFA 75% June 15, 2009 > 90% August 02, 2009 > 100% December 27, 2009  - Off allergy vaccines x7/2010 > highly allergic skin tests per Dr Maple Hudson > restarted vaccine November 06, 2009  SHOULDER PAIN, RIGHT (ICD-719.41)   HYPERTENSION, BORDERLINE (ICD-401.9)  Shingles T 8 Left 9/01  HEALTH MAINTENANCE...............................................................................................Marland KitchenWert  - Colonoscopy 12/1989 , 2011  - dT 03/2005  - Pneumovax 3/04 and 05/2009  -Flu declined October 17, 2008 and September 21, 2009  -CPX 03/27/12 - Meds reviewed with pt education and computerized med calendar adjusted October 19, 2009, February 20, 2010, September 10, 2010 , 05/22/2012            Objective:   Physical  Exam  amb bm nad  Wt 203  03/27/12 > 04/24/2012  202 >205 05/22/2012 > 06/24/2012  201> 193 10/02/2012   HEENT: nl dentition, turbinates, and orophanx. Nl external ear canals without cough reflex   NECK :  without JVD/Nodes/TM/ nl carotid upstrokes bilaterally   LUNGS: no acc muscle use, hyperresonant to percussion, mod increased T exp without cough on insp or exp maneuvers   CV:  RRR  no s3 or murmur or increase  in P2, no edema   ABD:  soft and nontender with nl excursion in the supine position. No bruits or organomegaly, bowel sounds nl  MS:  warm without deformities, nl gait, nocalf tenderness, cyanosis or clubbing            Assessment & Plan:

## 2012-10-02 NOTE — Patient Instructions (Signed)
See calendar for specific medication instructions and bring it back for each and every office visit for every healthcare provider you see.  Without it,  you may not receive the best quality medical care that we feel you deserve.  You will note that the calendar groups together  your maintenance  medications that are timed at particular times of the day.  Think of this as your checklist for what your doctor has instructed you to do until your next evaluation to see what benefit  there is  to staying on a consistent group of medications intended to keep you well.  The other group at the bottom is entirely up to you to use as you see fit  for specific symptoms that may arise between visits that require you to treat them on an as needed basis.  Think of this as your action plan or "what if" list.   Separating the top medications from the bottom group is fundamental to providing you adequate care going forward.   

## 2012-10-08 ENCOUNTER — Encounter: Payer: Self-pay | Admitting: Internal Medicine

## 2012-11-02 ENCOUNTER — Ambulatory Visit (INDEPENDENT_AMBULATORY_CARE_PROVIDER_SITE_OTHER): Payer: Medicare Other

## 2012-11-02 DIAGNOSIS — J309 Allergic rhinitis, unspecified: Secondary | ICD-10-CM | POA: Diagnosis not present

## 2012-11-05 DIAGNOSIS — H04129 Dry eye syndrome of unspecified lacrimal gland: Secondary | ICD-10-CM | POA: Diagnosis not present

## 2012-11-05 DIAGNOSIS — H40019 Open angle with borderline findings, low risk, unspecified eye: Secondary | ICD-10-CM | POA: Diagnosis not present

## 2012-11-05 DIAGNOSIS — H04219 Epiphora due to excess lacrimation, unspecified lacrimal gland: Secondary | ICD-10-CM | POA: Diagnosis not present

## 2012-11-05 DIAGNOSIS — Z961 Presence of intraocular lens: Secondary | ICD-10-CM | POA: Diagnosis not present

## 2012-11-18 ENCOUNTER — Ambulatory Visit (INDEPENDENT_AMBULATORY_CARE_PROVIDER_SITE_OTHER): Payer: Medicare Other

## 2012-11-18 DIAGNOSIS — J309 Allergic rhinitis, unspecified: Secondary | ICD-10-CM | POA: Diagnosis not present

## 2012-12-02 ENCOUNTER — Encounter: Payer: Self-pay | Admitting: Adult Health

## 2012-12-02 ENCOUNTER — Ambulatory Visit (INDEPENDENT_AMBULATORY_CARE_PROVIDER_SITE_OTHER): Payer: Medicare Other

## 2012-12-02 ENCOUNTER — Ambulatory Visit (INDEPENDENT_AMBULATORY_CARE_PROVIDER_SITE_OTHER): Payer: Medicare Other | Admitting: Adult Health

## 2012-12-02 VITALS — BP 120/82 | HR 78 | Temp 97.0°F | Ht 68.0 in | Wt 196.0 lb

## 2012-12-02 DIAGNOSIS — J309 Allergic rhinitis, unspecified: Secondary | ICD-10-CM | POA: Diagnosis not present

## 2012-12-02 DIAGNOSIS — J45909 Unspecified asthma, uncomplicated: Secondary | ICD-10-CM

## 2012-12-02 MED ORDER — PREDNISONE 10 MG PO TABS: ORAL_TABLET | ORAL | Status: DC

## 2012-12-02 MED ORDER — CEFDINIR 300 MG PO CAPS: 300.0000 mg | ORAL_CAPSULE | Freq: Two times a day (BID) | ORAL | Status: DC

## 2012-12-02 NOTE — Assessment & Plan Note (Signed)
Flare with AB   Plan Omnicef Twice daily  for 7 days  Mucinex DM Twice daily  As needed  Cough/congestion  Prednisone taper over next week.  Fluids and rest  Please contact office for sooner follow up if symptoms do not improve or worsen or seek emergency care  follow up Dr. Sherene Sires   in 2 months s and As needed

## 2012-12-02 NOTE — Patient Instructions (Addendum)
Omnicef Twice daily  for 7 days  Mucinex DM Twice daily  As needed  Cough/congestion  Prednisone taper over next week.  Fluids and rest  Please contact office for sooner follow up if symptoms do not improve or worsen or seek emergency care  follow up Dr. Sherene Sires   in 2 months s and As needed

## 2012-12-02 NOTE — Progress Notes (Signed)
Subjective:    Patient ID: [name]    DOB: January 30, 1941 .   MRN: [id number]    Brief patient profile:  71 yobm denies ever smoking but has lifelong asthma with severe chronic pattern and best FEV1 of around 2.2 liters documented 2002 followed in pulmonary for primary care also for hbp and hyperlipidemia.   Hx: 03/27/12 CPX scheduled but not due until 08/07/2012.  Continues to c/o cough but no excess mucus, no change doe or need for daytime rescue or overt sinus or reflux symtpoms Denies using rescue therapy at all, seems to be keeping up with his meds better. rec Follow calendar  04/24/2012 f/u ov/ wert, doing fine but lost calendar and has two empty inhalers with saba and one dulera with count 118. No purulent sputum. Denies needing saba daytime. No overt hb or sinus complaints. >>no changes   05/22/2012 Follow up and med review / NP ov Returns for follow up and med review.  We reviewed her meds and organized them into his med calendar w/ pt education  He depends on samples quite a bit.  No flare in asthma. No increased SABA use .  No nocturnal symptoms or ER visits.  rec Follow up in 4 weeks with Wert and As needed   Follow med calenadar closely and bring to each visit.  Low salt diet, exercise as tolerated, and weight loss.  06/24/2012 f/u ov/ Wert following med calendar cc breathing better than usual and no need for daytime saba at all.    rec No change meds, use med calendar   10/02/2012 f/u ov/Wert cc breathing better, rare daytime saba, No obvious daytime variabilty or assoc chronic cough or cp or chest tightness, subjective wheeze overt sinus or hb symptoms. No unusual exp hx >no changes   12/02/2012 Acute OV Complains of thick green mucus prod x5 days. Patient has a mild postnasal drip. He has been using some Mucinex without much relief. Patient denies any hemoptysis, orthopnea, PND, or leg swelling. Has been greater than one year since having used prednisone or  antibiotics. Cough and congestion are worse for the last 2 days, with thick, green and yellow mucus Cough is worse first thing in the morning and late at night       Past Medical Hx ASTHMA (ICD-493.90)  --chronic severe asthma with best FEV1 2.2 L documented 01/2001 and  documented non-adherence Mainly lives off samples of inhalers and nose sprays  - HFA 75% June 15, 2009 > 90% August 02, 2009 > 100% December 27, 2009  - Off allergy vaccines x7/2010 > highly allergic skin tests per Dr Maple Hudson > restarted vaccine November 06, 2009  SHOULDER PAIN, RIGHT (ICD-719.41)   HYPERTENSION, BORDERLINE (ICD-401.9)  Shingles T 8 Left 9/01  HEALTH MAINTENANCE...............................................................................................Marland KitchenWert  - Colonoscopy 12/1989 , 2011  - dT 03/2005  - Pneumovax 3/04 and 05/2009  -Flu declined October 17, 2008 and September 21, 2009  -CPX 03/27/12 - Meds reviewed with pt education and computerized med calendar adjusted October 19, 2009, February 20, 2010, September 10, 2010 , 05/22/2012       ROS: Constitutional:   No  weight loss, night sweats,  Fevers, chills, fatigue, or  lassitude.  HEENT:   No headaches,  Difficulty swallowing,  Tooth/dental problems, or  Sore throat,                No sneezing, itching, ear ache,  +nasal congestion, post nasal drip,   CV:  No chest pain,  Orthopnea, PND, swelling in lower extremities, anasarca, dizziness, palpitations, syncope.   GI  No heartburn, indigestion, abdominal pain, nausea, vomiting, diarrhea, change in bowel habits, loss of appetite, bloody stools.   Resp:   No coughing up of blood.  No chest wall deformity  Skin: no rash or lesions.  GU: no dysuria, change in color of urine, no urgency or frequency.  No flank pain, no hematuria   MS:  No joint pain or swelling.  No decreased range of motion.  No back pain.  Psych:  No change in mood or affect. No depression or anxiety.  No memory  loss.          Objective:   Physical Exam  amb bm nad  Wt 203  03/27/12 > 04/24/2012  202 >205 05/22/2012 > 06/24/2012  201> 193 10/02/2012 >196 12/02/2012   HEENT: nl dentition, turbinates, and orophanx. Nl external ear canals without cough reflex   NECK :  without JVD/Nodes/TM/ nl carotid upstrokes bilaterally   LUNGS: no acc muscle use, hyperresonant to percussion,  Exp wheezing bilaterally   CV:  RRR  no s3 or murmur or increase in P2, no edema   ABD:  soft and nontender with nl excursion in the supine position. No bruits or organomegaly, bowel sounds nl  MS:  warm without deformities, nl gait, nocalf tenderness, cyanosis or clubbing            Assessment & Plan:

## 2012-12-18 ENCOUNTER — Ambulatory Visit (INDEPENDENT_AMBULATORY_CARE_PROVIDER_SITE_OTHER): Payer: Medicare Other

## 2012-12-18 DIAGNOSIS — J309 Allergic rhinitis, unspecified: Secondary | ICD-10-CM | POA: Diagnosis not present

## 2012-12-19 ENCOUNTER — Emergency Department (HOSPITAL_COMMUNITY): Payer: Medicare Other

## 2012-12-19 ENCOUNTER — Encounter (HOSPITAL_COMMUNITY): Payer: Self-pay | Admitting: Emergency Medicine

## 2012-12-19 ENCOUNTER — Emergency Department (HOSPITAL_COMMUNITY)
Admission: EM | Admit: 2012-12-19 | Discharge: 2012-12-20 | Disposition: A | Discharge status: Home / Self Care | Payer: Medicare Other | Attending: Emergency Medicine | Admitting: Emergency Medicine

## 2012-12-19 DIAGNOSIS — R0789 Other chest pain: Secondary | ICD-10-CM | POA: Diagnosis not present

## 2012-12-19 DIAGNOSIS — Z8739 Personal history of other diseases of the musculoskeletal system and connective tissue: Secondary | ICD-10-CM | POA: Insufficient documentation

## 2012-12-19 DIAGNOSIS — Z79899 Other long term (current) drug therapy: Secondary | ICD-10-CM | POA: Diagnosis not present

## 2012-12-19 DIAGNOSIS — R05 Cough: Secondary | ICD-10-CM | POA: Diagnosis not present

## 2012-12-19 DIAGNOSIS — Z8619 Personal history of other infectious and parasitic diseases: Secondary | ICD-10-CM | POA: Diagnosis not present

## 2012-12-19 DIAGNOSIS — K219 Gastro-esophageal reflux disease without esophagitis: Secondary | ICD-10-CM | POA: Diagnosis not present

## 2012-12-19 DIAGNOSIS — Z7982 Long term (current) use of aspirin: Secondary | ICD-10-CM | POA: Insufficient documentation

## 2012-12-19 DIAGNOSIS — J209 Acute bronchitis, unspecified: Secondary | ICD-10-CM | POA: Diagnosis not present

## 2012-12-19 DIAGNOSIS — Z8719 Personal history of other diseases of the digestive system: Secondary | ICD-10-CM | POA: Insufficient documentation

## 2012-12-19 DIAGNOSIS — Z8669 Personal history of other diseases of the nervous system and sense organs: Secondary | ICD-10-CM | POA: Insufficient documentation

## 2012-12-19 DIAGNOSIS — J45909 Unspecified asthma, uncomplicated: Secondary | ICD-10-CM | POA: Diagnosis not present

## 2012-12-19 DIAGNOSIS — J40 Bronchitis, not specified as acute or chronic: Secondary | ICD-10-CM

## 2012-12-19 DIAGNOSIS — R9431 Abnormal electrocardiogram [ECG] [EKG]: Secondary | ICD-10-CM | POA: Diagnosis not present

## 2012-12-19 NOTE — ED Provider Notes (Signed)
History     CSN: [id number]  Arrival date & time 12/19/12  2015   First MD Initiated Contact with Patient 12/19/12 2354      Chief Complaint  Patient presents with  . Cough    (Consider location/radiation/quality/duration/timing/severity/associated sxs/prior treatment) HPI This is a 71 year old male with a history of asthma. He states that after Thanksgiving, being exposed to smoke at a relative's house, he was treated with an unspecified antibiotic and steroids. His symptoms were improved until Christmas when he again was exposed to smoke a relative's house. Since then he has had worsening shortness of breath, nonproductive cough and a sensation that he cannot cough up the phlegm that he feels in his chest. He describes his symptoms as moderate to severe and not relieved by his albuterol and other medications. Her dyspnea is worse with exertion. He has had a fever to 102. He states the fever has come and gone. He has also had some pain in his left lower posterior rib area. This pain is not changed with movement, exertion, palpation, deep breathing or cough. He denies nasal congestion, body aches, nausea, vomiting or diarrhea.  Past Medical History  Diagnosis Date  . Unspecified asthma   . Dysphagia   . Cataract   . Acid reflux   . Borderline hypertension   . Shingles   . Urinary frequency   . Shoulder pain, right   . Personal history of colonic polyps 12/1989  . Hiatal hernia   . Esophageal stricture   . GERD (gastroesophageal reflux disease)   . Asthma 06/18/2011    Past Surgical History  Procedure Date  . Hernia repair   . Hemorrhoid surgery   . Tonsillectomy     Family History  Problem Relation Age of Onset  . Heart disease Mother   . Kidney failure Sister   . Breast cancer Sister   . Lung cancer Brother   . Brain cancer Sister   . Asthma Sister   . Colon cancer Neg Hx     History  Substance Use Topics  . Smoking status: Never Smoker   . Smokeless tobacco:  Never Used  . Alcohol Use: No      Review of Systems  All other systems reviewed and are negative.    Allergies  Review of patient's allergies indicates no known allergies.  Home Medications   Current Outpatient Rx  Name  Route  Sig  Dispense  Refill  . ALBUTEROL SULFATE (2.5 MG/3ML) 0.083% IN NEBU   Nebulization   Take 2.5 mg by nebulization every 6 (six) hours as needed. Shortness of breath         . ASPIRIN 81 MG PO TBEC   Oral   Take 81 mg by mouth daily.           Marland Kitchen FAMOTIDINE 20 MG PO TABS   Oral   Take 1 tablet (20 mg total) by mouth at bedtime.         . GUAIFENESIN 400 MG PO TABS   Oral   Take 800 mg by mouth every 4 (four) hours as needed. cough         . MOMETASONE FUROATE 50 MCG/ACT NA SUSP   Nasal   Place 1 spray into the nose 2 (two) times daily.   17 g   prn   . OLMESARTAN MEDOXOMIL 40 MG PO TABS   Oral   Take 20 mg by mouth daily. Take 1/2 tablet daily  BP 128/77  Pulse 73  Temp 98.1 F (36.7 C) (Oral)  Resp 16  SpO2 93%  Physical Exam General: Well-developed, well-nourished male in no acute distress; appearance consistent with age of record HENT: normocephalic, atraumatic Eyes: pupils equal round and reactive to light; extraocular muscles intact; arcus senilis bilateral Neck: supple Heart: regular rate and rhythm Lungs: Coarse expiratory sounds bilaterally with decreased sounds in the left base Chest: Nontender Abdomen: soft; nondistended Extremities: No deformity; full range of motion; pulses normal Neurologic: Awake, alert and oriented; motor function intact in all extremities and symmetric; no facial droop Skin: Warm and dry Psychiatric: Normal mood and affect    ED Course  Procedures (including critical care time)     MDM  Nursing notes and vitals signs, including pulse oximetry, reviewed.  Summary of this visit's results, reviewed by myself:  Labs:  Results for orders placed during the hospital  encounter of 12/19/12 (from the past 24 hour(s))  CBC     Status: Normal   Collection Time   12/20/12 12:30 AM      Component Value Range   WBC 6.1  4.0 - 10.5 K/uL   RBC 4.63  4.22 - 5.81 MIL/uL   Hemoglobin 13.4  13.0 - 17.0 g/dL   HCT 16.1  09.6 - 04.5 %   MCV 85.3  78.0 - 100.0 fL   MCH 28.9  26.0 - 34.0 pg   MCHC 33.9  30.0 - 36.0 g/dL   RDW 40.9  81.1 - 91.4 %   Platelets 304  150 - 400 K/uL  BASIC METABOLIC PANEL     Status: Abnormal   Collection Time   12/20/12 12:30 AM      Component Value Range   Sodium 136  135 - 145 mEq/L   Potassium 4.8  3.5 - 5.1 mEq/L   Chloride 103  96 - 112 mEq/L   CO2 23  19 - 32 mEq/L   Glucose, Bld 121 (*) 70 - 99 mg/dL   BUN 14  6 - 23 mg/dL   Creatinine, Ser 7.82  0.50 - 1.35 mg/dL   Calcium 9.0  8.4 - 95.6 mg/dL   GFR calc non Af Amer 52 (*) >90 mL/min   GFR calc Af Amer 60 (*) >90 mL/min  TROPONIN I     Status: Normal   Collection Time   12/20/12 12:30 AM      Component Value Range   Troponin I <0.30  <0.30 ng/mL    Imaging Studies: Dg Chest 2 View  12/19/2012  *RADIOLOGY REPORT*  Clinical Data: Cough and left chest pressure.  CHEST - 2 VIEW  Comparison: 08/08/2011  Findings: Stable chronic changes including partially eventration of the right hemidiaphragm and scarring and atelectasis at the left lung base.  No pulmonary edema or pneumothorax identified.  Heart size is normal.  The bony thorax is unremarkable.  IMPRESSION: Stable chronic changes.  No active disease.   Original Report Authenticated By: Irish Lack, M.D.     Date: 12/19/2012 11:51 PM  Rate: 70  Rhythm: normal sinus rhythm  QRS Axis: normal  Intervals: normal  ST/T Wave abnormalities: normal  Conduction Disutrbances: none  Narrative Interpretation: unremarkable  Comparison with previous EKG: Prolonged QT no longer present  1:23 AM Improved air movement after albuterol and Atrovent neb treatment. In light of his fever and chronic lung disease we will start him  on an antibiotic for bronchitis.  Hanley Seamen, MD 12/20/12 [phone]

## 2012-12-19 NOTE — ED Notes (Signed)
Pt states he is coming in tonight for c/o cough  Pt states it started after Thanksgiving  Pt states he has hx of asthmatic bronchitis and has been having sxs that have come and go since after Thanksgiving  Pt states he is having a hard time getting the phlegm up when he coughs

## 2012-12-20 LAB — BASIC METABOLIC PANEL
CO2: 23 mEq/L (ref 19–32)
Chloride: 103 mEq/L (ref 96–112)
Glucose, Bld: 121 mg/dL — ABNORMAL HIGH (ref 70–99)
Potassium: 4.8 mEq/L (ref 3.5–5.1)
Sodium: 136 mEq/L (ref 135–145)

## 2012-12-20 LAB — CBC
Hemoglobin: 13.4 g/dL (ref 13.0–17.0)
RBC: 4.63 MIL/uL (ref 4.22–5.81)
WBC: 6.1 10*3/uL (ref 4.0–10.5)

## 2012-12-20 LAB — TROPONIN I: Troponin I: <0.3 ng/mL (ref <0.30)

## 2012-12-20 MED ORDER — IPRATROPIUM BROMIDE 0.02 % IN SOLN
0.5000 mg | Freq: Once | RESPIRATORY_TRACT | Status: AC
  Administered 2012-12-20: 0.5 mg via RESPIRATORY_TRACT
  Filled 2012-12-20: qty 2.5

## 2012-12-20 MED ORDER — DOXYCYCLINE HYCLATE 100 MG PO CAPS: 100.0000 mg | ORAL_CAPSULE | Freq: Two times a day (BID) | ORAL | Status: DC

## 2012-12-20 MED ORDER — ALBUTEROL SULFATE (5 MG/ML) 0.5% IN NEBU
5.0000 mg | INHALATION_SOLUTION | Freq: Once | RESPIRATORY_TRACT | Status: AC
  Administered 2012-12-20: 5 mg via RESPIRATORY_TRACT
  Filled 2012-12-20: qty 1

## 2012-12-20 MED ORDER — DOXYCYCLINE HYCLATE 100 MG PO TABS
100.0000 mg | ORAL_TABLET | Freq: Once | ORAL | Status: AC
  Administered 2012-12-20: 100 mg via ORAL
  Filled 2012-12-20: qty 1

## 2012-12-20 MED ORDER — METHYLPREDNISOLONE 4 MG PO KIT: PACK | ORAL | Status: DC

## 2013-01-15 ENCOUNTER — Ambulatory Visit (INDEPENDENT_AMBULATORY_CARE_PROVIDER_SITE_OTHER): Payer: Medicare Other

## 2013-01-15 DIAGNOSIS — J309 Allergic rhinitis, unspecified: Secondary | ICD-10-CM | POA: Diagnosis not present

## 2013-01-19 ENCOUNTER — Telehealth: Payer: Self-pay | Admitting: Internal Medicine

## 2013-01-19 MED ORDER — MOMETASONE FURO-FORMOTEROL FUM 200-5 MCG/ACT IN AERO: 2.0000 | INHALATION_SPRAY | Freq: Two times a day (BID) | RESPIRATORY_TRACT | Status: DC

## 2013-01-19 NOTE — Telephone Encounter (Signed)
Spoke with patient, made him away that 2 samples will be left upfront for pick up.  Patient is already scheduled to come in Feb 01, 2013 at 1015am.  Patient to do 2 puffs BID and make sure he keeps appt.

## 2013-01-19 NOTE — Telephone Encounter (Signed)
Ok dulera 200 2bid but make sure ov before 2 samples run out ( 4 weeks) to see [name] with all meds in hand to regroup

## 2013-01-19 NOTE — Telephone Encounter (Signed)
I spoke with the pt and he is asking for an RX for Dulera 200. I do not see this on the pt med list or mentioned in OV note. Pt states he has been using this daily and usually gets samples at visits. Dr. Sherene Sires please advise if the pt is supposed to be on [facility]. Carron Curie, CMA

## 2013-02-01 ENCOUNTER — Ambulatory Visit (INDEPENDENT_AMBULATORY_CARE_PROVIDER_SITE_OTHER): Payer: Medicare Other

## 2013-02-01 ENCOUNTER — Ambulatory Visit (INDEPENDENT_AMBULATORY_CARE_PROVIDER_SITE_OTHER): Payer: Medicare Other | Admitting: Internal Medicine

## 2013-02-01 ENCOUNTER — Encounter: Payer: Self-pay | Admitting: Internal Medicine

## 2013-02-01 VITALS — BP 130/82 | HR 73 | Temp 96.9°F | Ht 68.0 in | Wt 201.0 lb

## 2013-02-01 DIAGNOSIS — J309 Allergic rhinitis, unspecified: Secondary | ICD-10-CM | POA: Diagnosis not present

## 2013-02-01 DIAGNOSIS — J45909 Unspecified asthma, uncomplicated: Secondary | ICD-10-CM | POA: Diagnosis not present

## 2013-02-01 NOTE — Progress Notes (Signed)
Subjective:    Patient ID: [name]    DOB: 1941-12-07 .   MRN: [id number]    Brief patient profile:  71 yobm denies ever smoking but has lifelong asthma with severe chronic pattern and best FEV1 of around 2.2 liters documented 2002 followed in pulmonary for primary care also for hbp and hyperlipidemia.   Hx: 03/27/12 CPX scheduled but not due until 08/07/2012.  Continues to c/o cough but no excess mucus, no change doe or need for daytime rescue or overt sinus or reflux symtpoms Denies using rescue therapy at all, seems to be keeping up with his meds better. rec Follow calendar  04/24/2012 f/u ov/ wert, doing fine but lost calendar and has two empty inhalers with saba and one dulera with count 118. No purulent sputum. Denies needing saba daytime. No overt hb or sinus complaints. >>no changes   05/22/2012 Follow up and med review / NP ov Returns for follow up and med review.  We reviewed her meds and organized them into his med calendar w/ pt education  He depends on samples quite a bit.  No flare in asthma. No increased SABA use .  No nocturnal symptoms or ER visits.  rec Follow up in 4 weeks with Wert and As needed   Follow med calenadar closely and bring to each visit.  Low salt diet, exercise as tolerated, and weight loss.  06/24/2012 f/u ov/ Wert following med calendar cc breathing better than usual and no need for daytime saba at all.    rec No change meds, use med calendar   10/02/2012 f/u ov/Wert cc breathing better, rare daytime saba,  rec Omnicef Twice daily  for 7 days  Mucinex DM Twice daily  As needed  Cough/congestion  Prednisone taper over next week    02/01/2013 f/u ov/Wert cc breathing overall better since last round of prednisone, still dep on samples for meds, denies using much hfa albtuterol and went to er p 24 h of flare s taking full advantage of action plan on med calendar   No obvious daytime variabilty or assoc chronic cough or cp or chest tightness,  subjective wheeze overt sinus or hb symptoms. No unusual exp hx  Sleeping ok without nocturnal  or early am exacerbation  of respiratory  c/o's or need for noct saba. Also denies any obvious fluctuation of symptoms with weather or environmental changes or other aggravating or alleviating factors except as outlined above.  ROS  The following are not active complaints unless bolded sore throat, dysphagia, dental problems, itching, sneezing,  nasal congestion or excess/ purulent secretions, ear ache,   fever, chills, sweats, unintended wt loss, pleuritic or exertional cp, hemoptysis,  orthopnea pnd or leg swelling, presyncope, palpitations, heartburn, abdominal pain, anorexia, nausea, vomiting, diarrhea  or change in bowel or urinary habits, change in stools or urine, dysuria,hematuria,  rash, arthralgias, visual complaints, headache, numbness weakness or ataxia or problems with walking or coordination,  change in mood/affect or memory.           Past Medical Hx ASTHMA (ICD-493.90)  --chronic severe asthma with best FEV1 2.2 L documented 01/2001 and  documented non-adherence Mainly lives off samples of inhalers and nose sprays  - HFA 75% June 15, 2009 > 90% August 02, 2009 > 100% December 27, 2009  - Off allergy vaccines x7/2010 > highly allergic skin tests per Dr Maple Hudson > restarted vaccine November 06, 2009  SHOULDER PAIN, RIGHT (ICD-719.41)   HYPERTENSION, BORDERLINE (ICD-401.9)  Shingles T  8 Left 9/01  HEALTH MAINTENANCE...............................................................................................Marland KitchenWert  - Colonoscopy 12/1989 , 2011  - dT 03/2005  - Pneumovax 3/04 and 05/2009  -Flu declined October 17, 2008 and September 21, 2009  -CPX 03/27/12 - Meds reviewed with pt education and computerized med calendar adjusted October 19, 2009, February 20, 2010, September 10, 2010 , 05/22/2012            Objective:   Physical Exam  amb bm nad  Wt 203  03/27/12 > 04/24/2012  202 >205  05/22/2012 > 06/24/2012  201> 193 10/02/2012 > 201 02/01/2013   HEENT: nl dentition, turbinates, and orophanx. Nl external ear canals without cough reflex   NECK :  without JVD/Nodes/TM/ nl carotid upstrokes bilaterally   LUNGS: no acc muscle use, hyperresonant to percussion, mod increased T exp without cough on insp or exp maneuvers   CV:  RRR  no s3 or murmur or increase in P2, no edema   ABD:  soft and nontender with nl excursion in the supine position. No bruits or organomegaly, bowel sounds nl  MS:  warm without deformities, nl gait, nocalf tenderness, cyanosis or clubbing            Assessment & Plan:

## 2013-02-01 NOTE — Patient Instructions (Addendum)
[email or web address] is a web site that will obtain generic medications on a cash basis   See [name] w/in 4 weeks with all your medications, even over the counter meds, separated in two separate bags, the ones you take no matter what vs the ones you stop once you feel better and take only as needed when you feel you need them.   Tammy  will generate for you a new user friendly medication calendar that will put [place] all on the same page re: your medication use.     Without this process, it simply isn't possible to assure that we are providing  your outpatient care  with  the attention to detail we feel you deserve.   If we cannot assure that you're getting that kind of care,  then we cannot manage your problem effectively from this clinic.  Once you have seen Tammy and we are sure that we're all on the same page with your medication use she will arrange follow up with me.

## 2013-02-02 ENCOUNTER — Ambulatory Visit: Payer: Medicare Other | Admitting: Internal Medicine

## 2013-02-02 NOTE — Assessment & Plan Note (Signed)
--  chronic severe asthma with best FEV1 2.2 L documented 01/2001 and documented non-adherence Mainly lives off samples of inhalers and nose sprays - HFA 75% June 15, 2009 > 90% August 02, 2009 > 100% December 27, 2009  - Off allergy vaccines x7/2010 > highly allergic skin tests per Dr Maple Hudson > restart vaccine November 06, 2009  - Spirometry on "good day" 01/14/12 =  FEV1 1.54  Ratio 0.58  DDX of  difficult airways managment all start with A and  include Adherence, Ace Inhibitors, Acid Reflux, Active Sinus Disease, Alpha 1 Antitripsin deficiency, Anxiety masquerading as Airways dz,  ABPA,  allergy(esp in young), Aspiration (esp in elderly), Adverse effects of DPI,  Active smokers, plus two Bs  = Bronchiectasis and Beta blocker use..and one C= CHF  Adherence is always the initial "prime suspect" and is a multilayered concern that requires a "trust but verify" approach in every patient - starting with knowing how to use medications, especially inhalers, correctly, keeping up with refills and understanding the fundamental difference between maintenance and prns vs those medications only taken for a very short course and then stopped and not refilled.   I had an extended discussion with the patient today lasting 15 to 20 minutes of a 25 minute visit on the following issues:   Each maintenance medication was reviewed in detail including most importantly the difference between maintenance and as needed and under what circumstances the prns are to be used. This was done in the context of a medication calendar review which provided the patient with a user-friendly unambiguous mechanism for medication administration and reconciliation and provides an action plan for all active problems. It is critical that this be shown to every doctor  for modification during the office visit if necessary so the patient can use it as a working document.

## 2013-02-08 ENCOUNTER — Ambulatory Visit: Payer: Medicare Other

## 2013-02-22 ENCOUNTER — Ambulatory Visit (INDEPENDENT_AMBULATORY_CARE_PROVIDER_SITE_OTHER): Payer: Medicare Other

## 2013-02-22 DIAGNOSIS — J309 Allergic rhinitis, unspecified: Secondary | ICD-10-CM

## 2013-03-01 ENCOUNTER — Ambulatory Visit (INDEPENDENT_AMBULATORY_CARE_PROVIDER_SITE_OTHER): Payer: Medicare Other | Admitting: Adult Health

## 2013-03-01 ENCOUNTER — Encounter: Payer: Self-pay | Admitting: Adult Health

## 2013-03-01 ENCOUNTER — Ambulatory Visit (INDEPENDENT_AMBULATORY_CARE_PROVIDER_SITE_OTHER): Payer: Medicare Other

## 2013-03-01 VITALS — BP 134/74 | HR 74 | Temp 98.3°F | Ht 68.0 in | Wt 197.4 lb

## 2013-03-01 DIAGNOSIS — K219 Gastro-esophageal reflux disease without esophagitis: Secondary | ICD-10-CM | POA: Diagnosis not present

## 2013-03-01 DIAGNOSIS — J45909 Unspecified asthma, uncomplicated: Secondary | ICD-10-CM | POA: Diagnosis not present

## 2013-03-01 DIAGNOSIS — I10 Essential (primary) hypertension: Secondary | ICD-10-CM | POA: Diagnosis not present

## 2013-03-01 DIAGNOSIS — J309 Allergic rhinitis, unspecified: Secondary | ICD-10-CM | POA: Diagnosis not present

## 2013-03-01 NOTE — Patient Instructions (Addendum)
Follow up in 2 months with  Wert for physical .  Follow med calenadar closely and bring to each visit.  Low salt diet, exercise as tolerated, and weight loss.  Please contact office for sooner follow up if symptoms do not improve or worsen or seek emergency care

## 2013-03-01 NOTE — Assessment & Plan Note (Signed)
Cont on diet and PPI

## 2013-03-01 NOTE — Assessment & Plan Note (Signed)
No active flare  Cont on current regimen  Patient's medications were reviewed today and patient education was given. Computerized medication calendar was adjusted/completed

## 2013-03-01 NOTE — Progress Notes (Signed)
Subjective:    Patient ID: [name]    DOB: 02-13-1941 .   MRN: [id number]    Brief patient profile:  71 yobm denies ever smoking but has lifelong asthma with severe chronic pattern and best FEV1 of around 2.2 liters documented 2002 followed in pulmonary for primary care also for hbp and hyperlipidemia.   Hx: 03/27/12 CPX scheduled but not due until 08/07/2012.  Continues to c/o cough but no excess mucus, no change doe or need for daytime rescue or overt sinus or reflux symtpoms Denies using rescue therapy at all, seems to be keeping up with his meds better. rec Follow calendar  04/24/2012 f/u ov/ wert, doing fine but lost calendar and has two empty inhalers with saba and one dulera with count 118. No purulent sputum. Denies needing saba daytime. No overt hb or sinus complaints. >>no changes   05/22/2012 Follow up and med review / NP ov Returns for follow up and med review.  We reviewed her meds and organized them into his med calendar w/ pt education  He depends on samples quite a bit.  No flare in asthma. No increased SABA use .  No nocturnal symptoms or ER visits.  rec Follow up in 4 weeks with Wert and As needed   Follow med calenadar closely and bring to each visit.  Low salt diet, exercise as tolerated, and weight loss.  06/24/2012 f/u ov/ Wert following med calendar cc breathing better than usual and no need for daytime saba at all.    rec No change meds, use med calendar   10/02/2012 f/u ov/Wert cc breathing better, rare daytime saba,  rec Omnicef Twice daily  for 7 days  Mucinex DM Twice daily  As needed  Cough/congestion  Prednisone taper over next week    02/01/2013 f/u ov/Wert cc breathing overall better since last round of prednisone, still dep on samples for meds, denies using much hfa albtuterol and went to er p 24 h of flare s taking full advantage of action plan on med calendar >no changes   03/01/2013 Follow up and med review  Returns for follow up and med  review  We reviewed all his meds and organized them into a med calendar .  pt brought all meds with him today.  no new complaints.  Requesting new neb machine/supplies No flare of cough or wheezing.  No ER visit since last ov .  Appears to be taking meds correctly , but depends largely on samples.             Past Medical Hx ASTHMA (ICD-493.90)  --chronic severe asthma with best FEV1 2.2 L documented 01/2001 and  documented non-adherence Mainly lives off samples of inhalers and nose sprays  - HFA 75% June 15, 2009 > 90% August 02, 2009 > 100% December 27, 2009  - Off allergy vaccines x7/2010 > highly allergic skin tests per Dr Maple Hudson > restarted vaccine November 06, 2009  SHOULDER PAIN, RIGHT (ICD-719.41)   HYPERTENSION, BORDERLINE (ICD-401.9)  Shingles T 8 Left 9/01  HEALTH MAINTENANCE...............................................................................................Marland KitchenWert  - Colonoscopy 12/1989 , 2011  - dT 03/2005  - Pneumovax 3/04 and 05/2009  -Flu declined October 17, 2008 and September 21, 2009  -CPX 03/27/12 - Meds reviewed with pt education and computerized med calendar adjusted October 19, 2009, February 20, 2010, September 10, 2010 , 05/22/2012 , 03/01/2013            Objective:   Physical Exam  amb bm nad  Wt  203  03/27/12 > 04/24/2012  202 >205 05/22/2012 > 06/24/2012  201> 193 10/02/2012 > 201 02/01/2013 >197 03/01/2013   HEENT: nl dentition, turbinates, and orophanx. Nl external ear canals without cough reflex, poor dentition    NECK :  without JVD/Nodes/TM/ nl carotid upstrokes bilaterally   LUNGS: no acc muscle use, hyperresonant to percussion, CTA no wheezing   CV:  RRR  no s3 or murmur or increase in P2, no edema   ABD:  soft and nontender with nl excursion in the supine position. No bruits or organomegaly, bowel sounds nl  MS:  warm without deformities, nl gait, nocalf tenderness, cyanosis or clubbing            Assessment & Plan:

## 2013-03-01 NOTE — Addendum Note (Signed)
Addended by: Boone Master E on: 03/01/2013 04:22 PM   Modules accepted: Orders

## 2013-03-01 NOTE — Assessment & Plan Note (Signed)
Controlled on rx  Advised on diet and low salt

## 2013-03-18 ENCOUNTER — Ambulatory Visit (INDEPENDENT_AMBULATORY_CARE_PROVIDER_SITE_OTHER): Payer: Medicare Other

## 2013-03-18 DIAGNOSIS — J309 Allergic rhinitis, unspecified: Secondary | ICD-10-CM

## 2013-04-06 ENCOUNTER — Ambulatory Visit (INDEPENDENT_AMBULATORY_CARE_PROVIDER_SITE_OTHER): Payer: Medicare Other

## 2013-04-06 DIAGNOSIS — J309 Allergic rhinitis, unspecified: Secondary | ICD-10-CM

## 2013-04-19 ENCOUNTER — Ambulatory Visit (INDEPENDENT_AMBULATORY_CARE_PROVIDER_SITE_OTHER): Payer: Medicare Other

## 2013-04-19 DIAGNOSIS — J309 Allergic rhinitis, unspecified: Secondary | ICD-10-CM

## 2013-04-21 DIAGNOSIS — H04209 Unspecified epiphora, unspecified lacrimal gland: Secondary | ICD-10-CM | POA: Diagnosis not present

## 2013-04-21 DIAGNOSIS — H40019 Open angle with borderline findings, low risk, unspecified eye: Secondary | ICD-10-CM | POA: Diagnosis not present

## 2013-04-30 ENCOUNTER — Encounter: Payer: Self-pay | Admitting: Internal Medicine

## 2013-04-30 ENCOUNTER — Ambulatory Visit (INDEPENDENT_AMBULATORY_CARE_PROVIDER_SITE_OTHER): Payer: Medicare Other

## 2013-04-30 ENCOUNTER — Ambulatory Visit (INDEPENDENT_AMBULATORY_CARE_PROVIDER_SITE_OTHER)
Admission: RE | Admit: 2013-04-30 | Discharge: 2013-04-30 | Disposition: A | Discharge status: Home / Self Care | Payer: Medicare Other | Source: Ambulatory Visit | Attending: Internal Medicine | Admitting: Internal Medicine

## 2013-04-30 ENCOUNTER — Other Ambulatory Visit (INDEPENDENT_AMBULATORY_CARE_PROVIDER_SITE_OTHER): Payer: Medicare Other

## 2013-04-30 ENCOUNTER — Ambulatory Visit (INDEPENDENT_AMBULATORY_CARE_PROVIDER_SITE_OTHER): Payer: Medicare Other | Admitting: Internal Medicine

## 2013-04-30 VITALS — BP 140/84 | HR 61 | Temp 97.4°F | Ht 68.0 in | Wt 195.8 lb

## 2013-04-30 DIAGNOSIS — J309 Allergic rhinitis, unspecified: Secondary | ICD-10-CM

## 2013-04-30 DIAGNOSIS — I10 Essential (primary) hypertension: Secondary | ICD-10-CM

## 2013-04-30 DIAGNOSIS — E785 Hyperlipidemia, unspecified: Secondary | ICD-10-CM

## 2013-04-30 DIAGNOSIS — J984 Other disorders of lung: Secondary | ICD-10-CM | POA: Diagnosis not present

## 2013-04-30 DIAGNOSIS — J3089 Other allergic rhinitis: Secondary | ICD-10-CM

## 2013-04-30 DIAGNOSIS — Z0000 Encounter for general adult medical examination without abnormal findings: Secondary | ICD-10-CM

## 2013-04-30 DIAGNOSIS — J45909 Unspecified asthma, uncomplicated: Secondary | ICD-10-CM

## 2013-04-30 LAB — BASIC METABOLIC PANEL
BUN: 10 mg/dL (ref 6–23)
Chloride: 109 mEq/L (ref 96–112)
Creatinine, Ser: 1.4 mg/dL (ref 0.4–1.5)
GFR: 66.06 mL/min (ref >60.00)
Glucose, Bld: 83 mg/dL (ref 70–99)

## 2013-04-30 LAB — CBC WITH DIFFERENTIAL/PLATELET
Basophils Relative: 0.5 % (ref 0.0–3.0)
Eosinophils Relative: 7.3 % — ABNORMAL HIGH (ref 0.0–5.0)
HCT: 42.4 % (ref 39.0–52.0)
Lymphs Abs: 3.5 10*3/uL (ref 0.7–4.0)
MCV: 86.3 fl (ref 78.0–100.0)
Monocytes Absolute: 0.8 10*3/uL (ref 0.1–1.0)
Monocytes Relative: 9.4 % (ref 3.0–12.0)
RBC: 4.91 Mil/uL (ref 4.22–5.81)
WBC: 8.5 10*3/uL (ref 4.5–10.5)

## 2013-04-30 LAB — TSH: TSH: 1.73 u[IU]/mL (ref 0.35–5.50)

## 2013-04-30 LAB — HEPATIC FUNCTION PANEL
AST: 20 U/L (ref 0–37)
Albumin: 3.9 g/dL (ref 3.5–5.2)
Alkaline Phosphatase: 68 U/L (ref 39–117)
Total Protein: 6.8 g/dL (ref 6.0–8.3)

## 2013-04-30 LAB — LIPID PANEL
Total CHOL/HDL Ratio: 5
Triglycerides: 153 mg/dL — ABNORMAL HIGH (ref 0.0–149.0)
VLDL: 30.6 mg/dL (ref 0.0–40.0)

## 2013-04-30 LAB — URINALYSIS
Bilirubin Urine: NEGATIVE
Ketones, ur: NEGATIVE
Leukocytes, UA: NEGATIVE
Urine Glucose: NEGATIVE
Urobilinogen, UA: 0.2 (ref 0.0–1.0)

## 2013-04-30 LAB — LDL CHOLESTEROL, DIRECT: Direct LDL: 124.7 mg/dL

## 2013-04-30 NOTE — Progress Notes (Signed)
Subjective:    Patient ID: [name]    DOB: July 08, 1941 .   MRN: [id number]    Brief patient profile:  71 yobm denies ever smoking but has lifelong asthma with severe chronic pattern and best FEV1 of around 2.2 liters documented 2002 followed in pulmonary for primary care also for hbp and hyperlipidemia.   04/24/2012 f/u ov/ [name], doing fine but lost calendar and has two empty inhalers with saba and one dulera with count 118. No purulent sputum. Denies needing saba daytime. No overt hb or sinus complaints. >>no changes    .  03/01/2013 Follow up and med review  Returns for follow up and med review  We reviewed all his meds and organized them into a med calendar .  pt brought all meds with him today.  no new complaints.  Requesting new neb machine/supplies No flare of cough or wheezing.  No ER visit since last ov .  Appears to be taking meds correctly , but depends largely on samples  04/30/2013 f/u ov/[name] re cpx for multiple chronic rhitinis hbp, asthma, gerd Last saba x one week never neb at this point, appears to be using calendar well, still sample dependent  No obvious daytime variabilty or assoc chronic cough or cp or chest tightness, subjective wheeze overt sinus or hb symptoms. No unusual exp hx or h/o childhood pna/ asthma or premature birth to his knowledge.   Sleeping ok without nocturnal  or early am exacerbation  of respiratory  c/o's or need for noct saba. Also denies any obvious fluctuation of symptoms with weather or environmental changes or other aggravating or alleviating factors except as outlined above   Current Medications, Allergies,  , Family History, and Social History were reviewed in [employer] record.  ROS  The following are not active complaints unless bolded sore throat, dysphagia, dental problems, itching, sneezing,  nasal congestion or excess/ purulent secretions, ear ache,   fever, chills, sweats, unintended wt loss, pleuritic or  exertional cp, hemoptysis,  orthopnea pnd or leg swelling, presyncope, palpitations, heartburn, abdominal pain, anorexia, nausea, vomiting, diarrhea  or change in bowel or urinary habits, change in stools or urine, dysuria,hematuria,  rash, arthralgias, visual complaints, headache, numbness weakness or ataxia or problems with walking or coordination,  change in mood/affect or memory.                 Past Medical Hx ASTHMA (ICD-493.90)  --chronic severe asthma with best FEV1 2.2 L documented 01/2001 and  documented non-adherence Mainly lives off samples of inhalers and nose sprays  - HFA 75% June 15, 2009 > 90% August 02, 2009 > 100% December 27, 2009  - Off allergy vaccines x7/2010 > highly allergic skin tests per Dr Maple Hudson > restarted vaccine November 06, 2009  SHOULDER PAIN, RIGHT (ICD-719.41)   HYPERTENSION, BORDERLINE (ICD-401.9)  Shingles T 8 Left 9/01  HEALTH MAINTENANCE...............................................................................................Marland KitchenWert  - Colonoscopy 12/1989 , 2011  - dT 03/2005  - Pneumovax 3/04 and 05/2009  -Flu declined October 17, 2008 and September 21, 2009  -CPX 04/30/2013  - Meds reviewed with pt education and computerized med calendar adjusted October 19, 2009, February 20, 2010, September 10, 2010 , 05/22/2012 , 03/01/2013            Objective:   Physical Exam  amb bm nad  Wt 203  03/27/12 > 04/24/2012  202 >205 05/22/2012 > 06/24/2012  201> 193 10/02/2012 > 201 02/01/2013 >197 03/01/2013 > 04/30/2013  195  HEENT:  nl dentition, turbinates, and orophanx. Nl external ear canals without cough reflex, poor dentition    NECK :  without JVD/Nodes/TM/ nl carotid upstrokes bilaterally   LUNGS: no acc muscle use, hyperresonant to percussion, CTA no wheezing   CV:  RRR  no s3 or murmur or increase in P2, no edema   ABD:  soft and nontender with nl excursion in the supine position. No bruits or organomegaly, bowel sounds nl  MS:  Nl gait, warm without  deformities, nl gait, no calf tenderness, cyanosis or clubbing  GU uncirc, no nodules, neg IH  Rectal mild mod BPH, stool g neg  Neuro alert, no motor or cerebellar deficits or pathologic reflexes    CXR  04/30/2013 :   Mild scarring in the left lung base. No acute abnormality is seen.       Assessment & Plan:

## 2013-04-30 NOTE — Patient Instructions (Addendum)
Please remember to go to the lab and x-ray department downstairs for your tests - we will call you with the results when they are available.      See calendar for specific medication instructions and bring it back for each and every office visit for every healthcare provider you see.  Without it,  you may not receive the best quality medical care that we feel you deserve.  You will note that the calendar groups together  your maintenance  medications that are timed at particular times of the day.  Think of this as your checklist for what your doctor has instructed you to do until your next evaluation to see what benefit  there is  to staying on a consistent group of medications intended to keep you well.  The other group at the bottom is entirely up to you to use as you see fit  for specific symptoms that may arise between visits that require you to treat them on an as needed basis.  Think of this as your action plan or "what if" list.   Separating the top medications from the bottom group is fundamental to providing you adequate care going forward.     Please schedule a follow up office visit in 6 weeks, call sooner if needed to see [name]

## 2013-05-01 NOTE — Assessment & Plan Note (Signed)
B/p elevated at ov at 160/84 09/05/2011 >>rx benicar 40mg  1/2 daily along w/ lifesytle changes   Adequate control on present rx, reviewed

## 2013-05-01 NOTE — Assessment & Plan Note (Signed)
Off allergy vaccines x7/2010 > highly allergic skin tests per Dr Maple Hudson > restarted vaccine November 06, 2009  Allergy vaccine 1:50:000 10/30/09; adv to 1:10 11/20/10 [facility]  Rx per Dr Maple Hudson

## 2013-05-01 NOTE — Assessment & Plan Note (Signed)
-   Target LDL < 130 due to h/o hbp/ male gender  Lab Results  Component Value Date   CHOL 204* 04/30/2013   HDL 41.40 04/30/2013   LDLCALC 101* 05/31/2009   LDLDIRECT 124.7 04/30/2013   TRIG 153.0* 04/30/2013   CHOLHDL 5 04/30/2013    Adequate control on present rx, reviewed

## 2013-05-01 NOTE — Assessment & Plan Note (Signed)
--  chronic severe asthma with best FEV1 2.2 L documented 01/2001 and documented non-adherence Mainly lives off samples of inhalers and nose sprays - HFA 75% June 15, 2009 > 90% August 02, 2009 > 100% December 27, 2009  - Off allergy vaccines x7/2010 > highly allergic skin tests per Dr Maple Hudson > restart vaccine November 06, 2009  - Spirometry on "good day" 01/14/12 =  FEV1 1.54  Ratio 0.58  Adequate control on present rx, reviewed   All goals of chronic asthma control met including optimal (though certainly not nl) function and elimination of symptoms with minimal need for rescue therapy.  Contingencies discussed in full including contacting this office immediately if not controlling the symptoms using the rule of two's.

## 2013-05-01 NOTE — Assessment & Plan Note (Signed)
   Each maintenance medication was reviewed in detail including most importantly the difference between maintenance and as needed and under what circumstances the prns are to be used. This was done in the context of a medication calendar review which provided the patient with a user-friendly unambiguous mechanism for medication administration and reconciliation and provides an action plan for all active problems. It is critical that this be shown to every doctor  for modification during the office visit if necessary so the patient can use it as a working document.

## 2013-05-03 NOTE — Progress Notes (Signed)
Quick Note:  Spoke with pt and notified of results per Dr. Wert. Pt verbalized understanding and denied any questions.  ______ 

## 2013-05-11 ENCOUNTER — Telehealth: Payer: Self-pay | Admitting: Internal Medicine

## 2013-05-11 NOTE — Telephone Encounter (Signed)
Spoke with patient, patient states at this OV 04/30/13 w MW they discussed Dr. Sherene Sires filling out some sort of letter or form to send to the DOT in [place] so that patient can keep his [place] license.  Patient did not drop off any form he just states Dr. Sherene Sires said he would take care of this.  Dr. Sherene Sires can you please advise on this? Thank you!

## 2013-05-11 NOTE — Telephone Encounter (Signed)
I can do this but it will require separate ov and he needs to bring the forms with him and we need to check his eyesite as well

## 2013-05-12 NOTE — Telephone Encounter (Signed)
Called spoke with patient Ov w/ MW scheduled for 5.23.14 @ 0845 - pt to bring paperwork with him to ov Nothing further needed at this time; will sign off

## 2013-05-12 NOTE — Telephone Encounter (Signed)
Pt returned call. [name] °

## 2013-05-12 NOTE — Telephone Encounter (Signed)
lmomtcb x1 

## 2013-05-14 ENCOUNTER — Encounter: Payer: Self-pay | Admitting: Internal Medicine

## 2013-05-14 ENCOUNTER — Ambulatory Visit (INDEPENDENT_AMBULATORY_CARE_PROVIDER_SITE_OTHER): Payer: Medicare Other

## 2013-05-14 ENCOUNTER — Ambulatory Visit (INDEPENDENT_AMBULATORY_CARE_PROVIDER_SITE_OTHER): Payer: Medicare Other | Admitting: Internal Medicine

## 2013-05-14 VITALS — BP 124/74 | HR 74 | Temp 97.0°F | Ht 68.0 in | Wt 197.0 lb

## 2013-05-14 DIAGNOSIS — I10 Essential (primary) hypertension: Secondary | ICD-10-CM

## 2013-05-14 DIAGNOSIS — J309 Allergic rhinitis, unspecified: Secondary | ICD-10-CM | POA: Diagnosis not present

## 2013-05-14 DIAGNOSIS — J45909 Unspecified asthma, uncomplicated: Secondary | ICD-10-CM

## 2013-05-14 NOTE — Progress Notes (Signed)
Subjective:    Patient ID: [name]    DOB: 1941-04-07 .   MRN: [id number]    Brief patient profile:  84 yobm denies ever smoking but has lifelong asthma with severe chronic pattern and best FEV1 of around 2.2 liters documented 2002 followed in pulmonary for primary care also for hbp and hyperlipidemia.  HPI  04/24/2012 f/u ov/ wert, doing fine but lost calendar and has two empty inhalers with saba and one dulera with count 118. No purulent sputum. Denies needing saba daytime. No overt hb or sinus complaints. >>no changes     05/14/2013 f/u ov/Wert re asthma/ hbp/ dot forms  Chief Complaint  Patient presents with  . DOT Physical     Pt has no new co's today.     Denies limiting sob, no need for saba on dulera 200 2bid  No obvious daytime variabilty or assoc chronic cough or cp or chest tightness, subjective wheeze overt sinus or hb symptoms. No unusual exp hx or h/o childhood pna/ asthma or premature birth to his knowledge.   Sleeping ok without nocturnal  or early am exacerbation  of respiratory  c/o's or need for noct saba. Also denies any obvious fluctuation of symptoms with weather or environmental changes or other aggravating or alleviating factors except as outlined above   Current Medications, Allergies,  , Family History, and Social History were reviewed in [employer] record.  ROS  The following are not active complaints unless bolded sore throat, dysphagia, dental problems, itching, sneezing,  nasal congestion or excess/ purulent secretions, ear ache,   fever, chills, sweats, unintended wt loss, pleuritic or exertional cp, hemoptysis,  orthopnea pnd or leg swelling, presyncope, palpitations, heartburn, abdominal pain, anorexia, nausea, vomiting, diarrhea  or change in bowel or urinary habits, change in stools or urine, dysuria,hematuria,  rash, arthralgias, visual complaints, headache, numbness weakness or ataxia or problems with walking or  coordination,  change in mood/affect or memory.                 Past Medical Hx ASTHMA (ICD-493.90)  --chronic severe asthma with best FEV1 2.2 L documented 01/2001 and  documented non-adherence Mainly lives off samples of inhalers and nose sprays  - HFA 75% June 15, 2009 > 90% August 02, 2009 > 100% December 27, 2009  - Off allergy vaccines x7/2010 > highly allergic skin tests per Dr Maple Hudson > restarted vaccine November 06, 2009  SHOULDER PAIN, RIGHT (ICD-719.41)   HYPERTENSION, BORDERLINE (ICD-401.9)  Shingles T 8 Left 9/01  HEALTH MAINTENANCE...............................................................................................Marland KitchenWert  - Colonoscopy 12/1989 , 2011  - dT 03/2005  - Pneumovax 3/04 and 05/2009  -Flu declined October 17, 2008 and September 21, 2009  -CPX 04/30/2013  - Meds reviewed with pt education and computerized med calendar adjusted October 19, 2009, February 20, 2010, September 10, 2010 , 05/22/2012 , 03/01/2013            Objective:   Physical Exam  amb bm nad  Wt 203  03/27/12 > 04/24/2012  202 >205 05/22/2012 > 06/24/2012  201> 193 10/02/2012 > 201 02/01/2013 >197 03/01/2013 > 04/30/2013  195 > 05/14/2013   197   HEENT: nl dentition, turbinates, and orophanx. Nl external ear canals without cough reflex, poor dentition    NECK :  without JVD/Nodes/TM/ nl carotid upstrokes bilaterally   LUNGS: no acc muscle use, hyperresonant to percussion,  Trance bilateral end exp wheeze, mod prolonged Texp  CV:  RRR  no s3 or murmur  or increase in P2, no edema   ABD:  soft and nontender with nl excursion in the supine position. No bruits or organomegaly, bowel sounds nl  MS:  Nl gait, warm without deformities, nl gait, no calf tenderness, cyanosis or clubbing      CXR  04/30/2013 :   Mild scarring in the left lung base. No acute abnormality is seen.       Assessment & Plan:

## 2013-05-14 NOTE — Patient Instructions (Signed)
See calendar for specific medication instructions and bring it back for each and every office visit for every healthcare provider you see.  Without it,  you may not receive the best quality medical care that we feel you deserve.  You will note that the calendar groups together  your maintenance  medications that are timed at particular times of the day.  Think of this as your checklist for what your doctor has instructed you to do until your next evaluation to see what benefit  there is  to staying on a consistent group of medications intended to keep you well.  The other group at the bottom is entirely up to you to use as you see fit  for specific symptoms that may arise between visits that require you to treat them on an as needed basis.  Think of this as your action plan or "what if" list.   Separating the top medications from the bottom group is fundamental to providing you adequate care going forward.    Keep follow up appt with Tammy

## 2013-05-14 NOTE — Assessment & Plan Note (Addendum)
--  chronic severe asthma with best FEV1 2.2 L documented 01/2001 and documented non-adherence Mainly lives off samples of inhalers and nose sprays - HFA 75% June 15, 2009 > 90% August 02, 2009 > 100% December 27, 2009  - Off allergy vaccines x7/2010 > highly allergic skin tests per Dr Maple Hudson > restart vaccine November 06, 2009  - Spirometry on "good day" 01/14/12 =  FEV1 1.54  Ratio 0.58   All goals of chronic asthma control met including optimal (though not nl) function and elimination of symptoms with minimal need for rescue therapy.    Each maintenance medication was reviewed in detail including most importantly the difference between maintenance and as needed and under what circumstances the prns are to be used. This was done in the context of a medication calendar review which provided the patient with a user-friendly unambiguous mechanism for medication administration and reconciliation and provides an action plan for all active problems. It is critical that this be shown to every doctor  for modification during the office visit if necessary so the patient can use it as a working document.      Contingencies discussed in full including contacting this office immediately if not controlling the symptoms using the rule of two's.

## 2013-05-14 NOTE — Assessment & Plan Note (Signed)
Adequate control on present rx, reviewed  

## 2013-05-21 ENCOUNTER — Ambulatory Visit: Payer: Medicare Other

## 2013-05-31 ENCOUNTER — Telehealth: Payer: Self-pay | Admitting: Internal Medicine

## 2013-05-31 NOTE — Telephone Encounter (Signed)
lmomtcb x1 

## 2013-06-01 NOTE — Telephone Encounter (Signed)
lmomtcb x 2  

## 2013-06-02 NOTE — Telephone Encounter (Signed)
Called spoke with patient who stated that he dropped off a letter 10days ago from the DOT stating that the form that MW filled out at 5.23.14 ov was incorrect (was missing MW's credentials and office information).  Pt was not given a copy of the forms and they have yet to be scanned into pt's chart.  Called the [employer] scan station @ [phone] and spoke with Misty Stanley who verified that the forms have not been scanned yet but stated that she will check to see if the forms are waiting to be sent back here.  Misty Stanley stated they do not have any pending scan documents for MW.  Called our HIM/scan dept and spoke with Trish who is checking on this as well.

## 2013-06-02 NOTE — Telephone Encounter (Signed)
Pt returned triage's call.  [name] ° °

## 2013-06-02 NOTE — Telephone Encounter (Signed)
I spoke with Misty Stanley from the [address] scan center and she could not locate any paperwork for this patient. She looked in the pending paperwork and in scanned and it is not there.  Also Trish in our medical records office and they do not have the paperwork either. I called the pt and advised him that we will have Dr. Sherene Sires complete another DOT physical form with all information completed and that we will let him know once complete. I have given the paperwork to leslie and will send message to Dr. Sherene Sires. Carron Curie, CMA

## 2013-06-03 NOTE — Telephone Encounter (Signed)
I have filled out a new form for him He needs to redo his eye exam though  MW will need to sign of on forms once eye exam done Pt has pending appt with TP for 06/11/13 and JJ will take care of eye exam at that time Pt okay with this and states nothing further needed Forms given to JJ for appt 06/11/13

## 2013-06-11 ENCOUNTER — Ambulatory Visit (INDEPENDENT_AMBULATORY_CARE_PROVIDER_SITE_OTHER): Payer: Medicare Other | Admitting: Adult Health

## 2013-06-11 ENCOUNTER — Encounter: Payer: Self-pay | Admitting: Adult Health

## 2013-06-11 VITALS — BP 124/66 | HR 66 | Temp 97.1°F | Ht 68.0 in | Wt 196.2 lb

## 2013-06-11 DIAGNOSIS — J45909 Unspecified asthma, uncomplicated: Secondary | ICD-10-CM

## 2013-06-11 DIAGNOSIS — E785 Hyperlipidemia, unspecified: Secondary | ICD-10-CM | POA: Diagnosis not present

## 2013-06-11 DIAGNOSIS — J4540 Moderate persistent asthma, uncomplicated: Secondary | ICD-10-CM

## 2013-06-11 NOTE — Assessment & Plan Note (Signed)
Controlled , without flare .

## 2013-06-11 NOTE — Patient Instructions (Addendum)
Follow up in 2 months with Wert and As needed   Low salt diet, exercise as tolerated, and weight loss.  Please contact office for sooner follow up if symptoms do not improve or worsen or seek emergency care

## 2013-06-11 NOTE — Progress Notes (Signed)
Subjective:    Patient ID: [name]    DOB: July 04, 1941   Brief patient profile:  72 yobm denies ever smoking but has lifelong asthma with severe chronic pattern and best FEV1 of around 2.2 liters documented 2002 followed in pulmonary for primary care also for hbp and hyperlipidemia.  HPI  04/24/2012 f/u ov/ wert, doing fine but lost calendar and has two empty inhalers with saba and one dulera with count 118. No purulent sputum. Denies needing saba daytime. No overt hb or sinus complaints. >>no changes     05/14/2013 f/u ov/Wert re asthma/ hbp/ dot forms  Chief Complaint  Patient presents with  . DOT Physical     Pt has no new co's today.    >>CPX and DOT    06/11/2013 Follow up  Returns for follow up  .Doing well since last visit. His asthma is under good control without flare of cough or wheezing. No increased use of albuterol. Seen last visit for DOT physical. Labs were essentially unremarkable. Patient was advised on a low-fat, low-cholesterol diet. Unfortunately, He did need his DOT forms redone b/cause they were misplaced from DOT.     Current Medications, Allergies,  , Family History, and Social History were reviewed in [employer] record.  ROS  The following are not active complaints unless bolded sore throat, dysphagia, dental problems, itching, sneezing,  nasal congestion or excess/ purulent secretions, ear ache,   fever, chills, sweats, unintended wt loss, pleuritic or exertional cp, hemoptysis,  orthopnea pnd or leg swelling, presyncope, palpitations, heartburn, abdominal pain, anorexia, nausea, vomiting, diarrhea  or change in bowel or urinary habits, change in stools or urine, dysuria,hematuria,  rash, arthralgias, visual complaints, headache, numbness weakness or ataxia or problems with walking or coordination,  change in mood/affect or memory.                 Past Medical Hx ASTHMA (ICD-493.90)  --chronic severe asthma with best  FEV1 2.2 L documented 01/2001 and  documented non-adherence Mainly lives off samples of inhalers and nose sprays  - HFA 75% June 15, 2009 > 90% August 02, 2009 > 100% December 27, 2009  - Off allergy vaccines x7/2010 > highly allergic skin tests per Dr Maple Hudson > restarted vaccine November 06, 2009  SHOULDER PAIN, RIGHT (ICD-719.41)   HYPERTENSION, BORDERLINE (ICD-401.9)  Shingles T 8 Left 9/01  HEALTH MAINTENANCE...............................................................................................Marland KitchenWert  - Colonoscopy 12/1989 , 2011  - dT 03/2005  - Pneumovax 3/04 and 05/2009  -Flu declined October 17, 2008 and September 21, 2009  -CPX 04/30/2013  - Meds reviewed with pt education and computerized med calendar adjusted October 19, 2009, February 20, 2010, September 10, 2010 , 05/22/2012 , 03/01/2013            Objective:   Physical Exam  amb bm nad  Wt 203  03/27/12 > 04/24/2012  202 >205 05/22/2012 > 06/24/2012  201> 193 10/02/2012 > 201 02/01/2013 >197 03/01/2013 > 04/30/2013  195 > 05/14/2013   197   HEENT: nl dentition, turbinates, and orophanx. Nl external ear canals without cough reflex, poor dentition    NECK :  without JVD/Nodes/TM/ nl carotid upstrokes bilaterally   LUNGS: no acc muscle use, hyperresonant to percussion,  CTA no wheezing   CV:  RRR  no s3 or murmur or increase in P2, no edema   ABD:  soft and nontender with nl excursion in the supine position. No bruits or organomegaly, bowel sounds nl  MS:  Nl gait, warm without deformities, nl gait, no calf tenderness, cyanosis or clubbing      CXR  04/30/2013 :   Mild scarring in the left lung base. No acute abnormality is seen.       Assessment & Plan:

## 2013-06-11 NOTE — Assessment & Plan Note (Signed)
At goal  Advised on diet and exercise.

## 2013-06-22 ENCOUNTER — Ambulatory Visit (INDEPENDENT_AMBULATORY_CARE_PROVIDER_SITE_OTHER): Payer: Medicare Other

## 2013-06-22 DIAGNOSIS — J309 Allergic rhinitis, unspecified: Secondary | ICD-10-CM | POA: Diagnosis not present

## 2013-06-23 ENCOUNTER — Ambulatory Visit: Payer: Medicare Other | Admitting: Internal Medicine

## 2013-07-16 ENCOUNTER — Ambulatory Visit (INDEPENDENT_AMBULATORY_CARE_PROVIDER_SITE_OTHER): Payer: Medicare Other

## 2013-07-16 DIAGNOSIS — J309 Allergic rhinitis, unspecified: Secondary | ICD-10-CM

## 2013-07-30 ENCOUNTER — Ambulatory Visit (INDEPENDENT_AMBULATORY_CARE_PROVIDER_SITE_OTHER): Payer: Medicare Other | Admitting: Internal Medicine

## 2013-07-30 ENCOUNTER — Encounter: Payer: Self-pay | Admitting: Internal Medicine

## 2013-07-30 VITALS — BP 126/82 | HR 67 | Ht 68.0 in | Wt 202.0 lb

## 2013-07-30 DIAGNOSIS — J3089 Other allergic rhinitis: Secondary | ICD-10-CM

## 2013-07-30 DIAGNOSIS — J45909 Unspecified asthma, uncomplicated: Secondary | ICD-10-CM

## 2013-07-30 DIAGNOSIS — J309 Allergic rhinitis, unspecified: Secondary | ICD-10-CM

## 2013-07-30 NOTE — Patient Instructions (Addendum)
You have been on allergy shots now for 4 years. Instead of re-ordering vaccine now, we are going to stop your allergy shots and watch to see how you do. We can re-assess later as needed.  I will be happy to see you as needed. You will continue to follow with Dr Sherene Sires for your pulmonary care.

## 2013-07-30 NOTE — Progress Notes (Signed)
Subjective:    Patient ID: [name]    DOB: 21-Dec-1941   Brief patient profile:  72 yobm denies ever smoking but has lifelong asthma with severe chronic pattern and best FEV1 of around 2.2 liters documented 2002 followed in pulmonary for primary care also for hbp and hyperlipidemia.  HPI  04/24/2012 f/u ov/ wert, doing fine but lost calendar and has two empty inhalers with saba and one dulera with count 118. No purulent sputum. Denies needing saba daytime. No overt hb or sinus complaints. >>no changes   05/14/2013 f/u ov/Wert re asthma/ hbp/ dot forms  Chief Complaint  Patient presents with  . DOT Physical     Pt has no new co's today.    >>CPX and DOT    06/11/2013 Follow up  Returns for follow up  .Doing well since last visit. His asthma is under good control without flare of cough or wheezing. No increased use of albuterol. Seen last visit for DOT physical. Labs were essentially unremarkable. Patient was advised on a low-fat, low-cholesterol diet. Unfortunately, He did need his DOT forms redone b/cause they were misplaced from DOT.   07/30/13- 72 yobm never smoker followed by Dr.Wert for chronic asthma and seen today for annual allergy followup on allergy vaccine.. Yearly follow up.  No problems with allergies at this time.  Occas cough with white mucus. Allergy vaccine 1:10 since 2010 [facility] He is reserved, but but indicates allergy vaccine seems to help with no problems associated. No nasal congestion or drainage. Little cough or wheeze. Current Medications, Allergies,  , Family History, and Social History were reviewed in [employer] record.  ROS  The following are not active complaints unless bolded sore throat, dysphagia, dental problems, itching, sneezing,  nasal congestion or excess/ purulent secretions, ear ache,   fever, chills, sweats, unintended wt loss, pleuritic or exertional cp, hemoptysis,  orthopnea pnd or leg swelling, presyncope, palpitations,  heartburn, abdominal pain, anorexia, nausea, vomiting, diarrhea  or change in bowel or urinary habits, change in stools or urine, dysuria,hematuria,  rash, arthralgias, visual complaints, headache, numbness weakness or ataxia or problems with walking or coordination,  change in mood/affect or memory.  Past Medical Hx ASTHMA (ICD-493.90)  --chronic severe asthma with best FEV1 2.2 L documented 01/2001 and  documented non-adherence Mainly lives off samples of inhalers and nose sprays  - HFA 75% June 15, 2009 > 90% August 02, 2009 > 100% December 27, 2009  - Off allergy vaccines x7/2010 > highly allergic skin tests per Dr Maple Hudson > restarted vaccine November 06, 2009  SHOULDER PAIN, RIGHT (ICD-719.41)   HYPERTENSION, BORDERLINE (ICD-401.9)  Shingles T 8 Left 9/01  HEALTH MAINTENANCE...............................................................................................Marland KitchenWert  - Colonoscopy 12/1989 , 2011  - dT 03/2005  - Pneumovax 3/04 and 05/2009  -Flu declined October 17, 2008 and September 21, 2009  -CPX 04/30/2013  - Meds reviewed with pt education and computerized med calendar adjusted October 19, 2009, February 20, 2010, September 10, 2010 , 05/22/2012 , 03/01/2013     Objective:  OBJ- Physical Exam General- Alert, Oriented, Affect-appropriate, Distress- none acute Skin- rash-none, lesions- none, excoriation- none Lymphadenopathy- none Head- atraumatic            Eyes- Gross vision intact, PERRLA, conjunctivae and secretions clear            Ears- Hearing, canals-normal            Nose- Clear, no-Septal dev, mucus, polyps, erosion, perforation  Throat- Mallampati II , mucosa clear , drainage- none, tonsils- atrophic Neck- flexible , trachea midline, no stridor , thyroid nl, carotid no bruit Chest - symmetrical excursion , unlabored           Heart/CV- RRR , no murmur , no gallop  , no rub, nl s1 s2                           - JVD- none , edema- none, stasis changes- none, varices-  none           Lung- +slight end-expiratory wheezing, unlabored, cough- none , dullness-none, rub- none           Chest wall-  Abd-  Br/ Gen/ Rectal- Not done, not indicated Extrem- cyanosis- none, clubbing, none, atrophy- none, strength- nl Neuro- grossly intact to observation      Assessment & Plan:

## 2013-08-11 NOTE — Assessment & Plan Note (Signed)
He describes good symptom control. He's been on allergy vaccine now for 4 years and seems better overall. we discussed goals  plan-stopped allergy vaccine now. Watch for stability.

## 2013-08-11 NOTE — Assessment & Plan Note (Signed)
We're stopping allergy vaccine now after 4 years. Watch for stability

## 2013-08-13 ENCOUNTER — Encounter: Payer: Self-pay | Admitting: Internal Medicine

## 2013-08-13 ENCOUNTER — Ambulatory Visit (INDEPENDENT_AMBULATORY_CARE_PROVIDER_SITE_OTHER): Payer: Medicare Other | Admitting: Internal Medicine

## 2013-08-13 VITALS — BP 124/72 | HR 68 | Temp 97.2°F | Ht 68.0 in | Wt 199.0 lb

## 2013-08-13 DIAGNOSIS — I10 Essential (primary) hypertension: Secondary | ICD-10-CM | POA: Diagnosis not present

## 2013-08-13 DIAGNOSIS — J302 Other seasonal allergic rhinitis: Secondary | ICD-10-CM

## 2013-08-13 DIAGNOSIS — J309 Allergic rhinitis, unspecified: Secondary | ICD-10-CM

## 2013-08-13 DIAGNOSIS — J45909 Unspecified asthma, uncomplicated: Secondary | ICD-10-CM | POA: Diagnosis not present

## 2013-08-13 NOTE — Assessment & Plan Note (Signed)
Adequate control on present rx, reviewed > no change in rx needed   

## 2013-08-13 NOTE — Progress Notes (Signed)
Subjective:    Patient ID: [name]    DOB: 1941-08-10   Brief patient profile:  72 yobm denies ever smoking but has lifelong asthma with severe chronic pattern and best FEV1 of around 2.2 liters documented 2002 followed in pulmonary for primary care also for hbp and hyperlipidemia.  HPI  04/24/2012 f/u ov/ wert, doing fine but lost calendar and has two empty inhalers with saba and one dulera with count 118. No purulent sputum. Denies needing saba daytime. No overt hb or sinus complaints. >>no changes / discussed adherence    07/30/13 Allergy eval rx complete x 4 years > d/c  08/13/2013 f/u ov/Wert re asthma/ hbp Chief Complaint  Patient presents with  . Asthma    Breathing is doing well. Reports coughing from time to time. Denies SOB, chest tightness or wheezing at this time.   rare need for saba hfa, even less for neb (maybe once a month)  No obvious daytime variabilty or assoc chronic cough or cp or chest tightness, subjective wheeze overt sinus or hb symptoms. No unusual exp hx or h/o childhood pna/ asthma or knowledge of premature birth.   Sleeping ok without nocturnal  or early am exacerbation  of respiratory  c/o's or need for noct saba. Also denies any obvious fluctuation of symptoms with weather or environmental changes or other aggravating or alleviating factors except as outlined above     Current Medications, Allergies,  , Family History, and Social History were reviewed in [employer] record.  ROS  The following are not active complaints unless bolded sore throat, dysphagia, dental problems, itching, sneezing,  nasal congestion or excess/ purulent secretions, ear ache,   fever, chills, sweats, unintended wt loss, pleuritic or exertional cp, hemoptysis,  orthopnea pnd or leg swelling, presyncope, palpitations, heartburn, abdominal pain, anorexia, nausea, vomiting, diarrhea  or change in bowel or urinary habits, change in stools or urine,  dysuria,hematuria,  rash, arthralgias, visual complaints, headache, numbness weakness or ataxia or problems with walking or coordination,  change in mood/affect or memory.                 Past Medical Hx ASTHMA (ICD-493.90)  --chronic severe asthma with best FEV1 2.2 L documented 01/2001 and  documented non-adherence Mainly lives off samples of inhalers and nose sprays  - HFA 75% June 15, 2009 > 90% August 02, 2009 > 100% December 27, 2009  - Off allergy vaccines x7/2010 > highly allergic skin tests per Dr Maple Hudson > restarted vaccine November 06, 2009  SHOULDER PAIN, RIGHT (ICD-719.41)   HYPERTENSION, BORDERLINE (ICD-401.9)  Shingles T 8 Left 9/01  HEALTH MAINTENANCE...............................................................................................Marland KitchenWert  - Colonoscopy 12/1989 , 2011  - dT 03/2005  - Pneumovax 3/04 and 05/2009  -Flu declined October 17, 2008 and September 21, 2009  -CPX 04/30/2013  - Meds reviewed with pt education and computerized med calendar adjusted October 19, 2009, February 20, 2010, September 10, 2010 , 05/22/2012 , 03/01/2013            Objective:   Physical Exam  amb bm nad  Wt 203  03/27/12  Wt Readings from Last 3 Encounters:  08/13/13 199 lb (90.266 kg)  07/30/13 202 lb (91.627 kg)  06/11/13 196 lb 3.2 oz (88.996 kg)     HEENT: nl dentition, turbinates, and orophanx. Nl external ear canals without cough reflex, poor dentition    NECK :  without JVD/Nodes/TM/ nl carotid upstrokes bilaterally   LUNGS: no acc muscle use, hyperresonant to  percussion,  CTA trace mid to late bilateral wheezing   CV:  RRR  no s3 or murmur or increase in P2, no edema   ABD:  soft and nontender with nl excursion in the supine position. No bruits or organomegaly, bowel sounds nl  MS:  Nl gait, warm without deformities, nl gait, no calf tenderness, cyanosis or clubbing      CXR  04/30/2013 :   Mild scarring in the left lung base. No acute abnormality is  seen.       Assessment & Plan:

## 2013-08-13 NOTE — Assessment & Plan Note (Signed)
Off allergy vaccines x7/2010 > highly allergic skin tests per Dr Maple Hudson > restarted vaccine November 06, 2009  Allergy vaccine 1:50:000 10/30/09; adv to 1:10 11/20/10 [facility]. D/C 07/30/13  Discussed with pt being on lookout for increasing symptoms of rhinitis or asthma

## 2013-08-13 NOTE — Patient Instructions (Addendum)
See calendar for specific medication instructions and bring it back for each and every office visit for every healthcare provider you see.  Without it,  you may not receive the best quality medical care that we feel you deserve.  You will note that the calendar groups together  your maintenance  medications that are timed at particular times of the day.  Think of this as your checklist for what your doctor has instructed you to do until your next evaluation to see what benefit  there is  to staying on a consistent group of medications intended to keep you well.  The other group at the bottom is entirely up to you to use as you see fit  for specific symptoms that may arise between visits that require you to treat them on an as needed basis.  Think of this as your action plan or "what if" list.   Separating the top medications from the bottom group is fundamental to providing you adequate care going forward.    Please schedule a follow up visit in 3 months to see [name]  but call sooner if needed

## 2013-08-13 NOTE — Assessment & Plan Note (Signed)
--  chronic severe asthma with best FEV1 2.2 L documented 01/2001 and documented non-adherence Mainly lives off samples of inhalers and nose sprays - HFA 75% June 15, 2009 > 90% August 02, 2009 > 100% December 27, 2009  - Off allergy vaccines x7/2010 > highly allergic skin tests per Dr Maple Hudson > restart vaccine November 06, 2009>stop 07/30/13 to watch - Spirometry on "good day" 01/14/12 =  FEV1 1.54  Ratio 0.58   All goals of chronic asthma control met including optimal 9though certainly not nl) function and elimination of symptoms with minimal need for rescue therapy.  Contingencies discussed in full including contacting this office immediately if not controlling the symptoms using the rule of two's.     Adherence continues to be a challenge related to cost of meds but no room to cut back at this point esp now that off shots    Each maintenance medication was reviewed in detail including most importantly the difference between maintenance and as needed and under what circumstances the prns are to be used. This was done in the context of a medication calendar review which provided the patient with a user-friendly unambiguous mechanism for medication administration and reconciliation and provides an action plan for all active problems. It is critical that this be shown to every doctor  for modification during the office visit if necessary so the patient can use it as a working document.

## 2013-09-15 ENCOUNTER — Telehealth: Payer: Self-pay | Admitting: Internal Medicine

## 2013-09-15 NOTE — Telephone Encounter (Signed)
LMOMTCB x 1 

## 2013-09-16 ENCOUNTER — Encounter: Payer: Self-pay | Admitting: Adult Health

## 2013-09-16 ENCOUNTER — Ambulatory Visit (INDEPENDENT_AMBULATORY_CARE_PROVIDER_SITE_OTHER): Payer: Medicare Other | Admitting: Adult Health

## 2013-09-16 VITALS — BP 126/66 | HR 72 | Temp 97.3°F | Ht 68.0 in | Wt 201.2 lb

## 2013-09-16 DIAGNOSIS — J45909 Unspecified asthma, uncomplicated: Secondary | ICD-10-CM

## 2013-09-16 MED ORDER — CEFDINIR 300 MG PO CAPS: 300.0000 mg | ORAL_CAPSULE | Freq: Two times a day (BID) | ORAL | Status: DC

## 2013-09-16 MED ORDER — PREDNISONE 10 MG PO TABS: ORAL_TABLET | ORAL | Status: DC

## 2013-09-16 NOTE — Telephone Encounter (Signed)
Spoke with patient-he will be here today at noon to see TP.

## 2013-09-16 NOTE — Telephone Encounter (Signed)
Last OV 08/13/13. Pt states he had a productive cough with green phlegm and sinus congestion with green phlegm over the weekend. He states he had some doxycycline at home and took this. He states he feels better but still has productive cough with clear phlegm and sinus congestion with clear phlegm as well. Pt denies any SOB, wheezing, chest tightness at this time but is concerned this will happen if he does not get medication. Pt is requesting rx for abx. Please advise. Carron Curie, CMA No Known Allergies

## 2013-09-16 NOTE — Telephone Encounter (Signed)
Clear mucus = no abx needed Prefer ov with [name] but if declines: Prednisone 10 mg take  4 each am x 2 days,   2 each am x 2 days,  1 each am x 2 days and stop

## 2013-09-16 NOTE — Assessment & Plan Note (Signed)
Exacerbation with URI   Plan  Omnicef Twice daily  for 7 days  Mucinex DM Twice daily  As needed  Cough/congestion  Prednisone taper over next week.  Fluids and rest  Please contact office for sooner follow up if symptoms do not improve or worsen or seek emergency care  Follow up in November as planned

## 2013-09-16 NOTE — Progress Notes (Signed)
Subjective:    Patient ID: [name]    DOB: 15-Aug-1941   Brief patient profile:  72 yobm denies ever smoking but has lifelong asthma with severe chronic pattern and best FEV1 of around 2.2 liters documented 2002 followed in pulmonary for primary care also for hbp and hyperlipidemia.  HPI  04/24/2012 f/u ov/ wert, doing fine but lost calendar and has two empty inhalers with saba and one dulera with count 118. No purulent sputum. Denies needing saba daytime. No overt hb or sinus complaints. >>no changes / discussed adherence    07/30/13 Allergy eval rx complete x 4 years > d/c  08/13/2013 f/u ov/Wert re asthma/ hbp Chief Complaint  Patient presents with  . Asthma    Breathing is doing well. Reports coughing from time to time. Denies SOB, chest tightness or wheezing at this time.   rare need for saba hfa, even less for neb (maybe once a month) >no changes   09/16/2013 Acute OV  Pt complains of prod cough with white mucus x1 week - denies SOB,  tightness, hemoptysis, n/v, edema Complains over the last week. The cough and congestion have worsened. Now. He has very thick mucus, nasal drainage, and intermittent wheezing. Patient denies any hemoptysis, orthopnea, PND, or leg swelling.  Has had to use his rescue inhaler more often than usual. Patient has been using Mucinex DM without much help.    Current Medications, Allergies,  , Family History, and Social History were reviewed in [employer] record.  ROS  The following are not active complaints unless bolded sore throat, dysphagia, dental problems, itching, sneezing,  nasal congestion or excess/ purulent secretions, ear ache,   fever, chills, sweats, unintended wt loss, pleuritic or exertional cp, hemoptysis,  orthopnea pnd or leg swelling, presyncope, palpitations, heartburn, abdominal pain, anorexia, nausea, vomiting, diarrhea  or change in bowel or urinary habits, change in stools or urine, dysuria,hematuria,   rash, arthralgias, visual complaints, headache, numbness weakness or ataxia or problems with walking or coordination,  change in mood/affect or memory.                 Past Medical Hx ASTHMA (ICD-493.90)  --chronic severe asthma with best FEV1 2.2 L documented 01/2001 and  documented non-adherence Mainly lives off samples of inhalers and nose sprays  - HFA 75% June 15, 2009 > 90% August 02, 2009 > 100% December 27, 2009  - Off allergy vaccines x7/2010 > highly allergic skin tests per Dr Maple Hudson > restarted vaccine November 06, 2009  SHOULDER PAIN, RIGHT (ICD-719.41)   HYPERTENSION, BORDERLINE (ICD-401.9)  Shingles T 8 Left 9/01  HEALTH MAINTENANCE...............................................................................................Marland KitchenWert  - Colonoscopy 12/1989 , 2011  - dT 03/2005  - Pneumovax 3/04 and 05/2009  -Flu declined October 17, 2008 and September 21, 2009  -CPX 04/30/2013  - Meds reviewed with pt education and computerized med calendar adjusted October 19, 2009, February 20, 2010, September 10, 2010 , 05/22/2012 , 03/01/2013            Objective:   Physical Exam  amb bm nad     HEENT: nl dentition, turbinates, and orophanx. Nl external ear canals without cough reflex, poor dentition    NECK :  without JVD/Nodes/TM/ nl carotid upstrokes bilaterally   LUNGS: no acc muscle use, hyperresonant to percussion,   trace mid to late bilateral wheezing   CV:  RRR  no s3 or murmur or increase in P2, no edema   ABD:  soft and nontender with  nl excursion in the supine position. No bruits or organomegaly, bowel sounds nl  MS:  Nl gait, warm without deformities, nl gait, no calf tenderness, cyanosis or clubbing      CXR  04/30/2013 :   Mild scarring in the left lung base. No acute abnormality is seen.       Assessment & Plan:

## 2013-09-16 NOTE — Patient Instructions (Addendum)
Omnicef Twice daily  for 7 days  Mucinex DM Twice daily  As needed  Cough/congestion  Prednisone taper over next week.  Fluids and rest  Please contact office for sooner follow up if symptoms do not improve or worsen or seek emergency care  Follow up in November as planned

## 2013-10-05 ENCOUNTER — Telehealth: Payer: Self-pay | Admitting: Internal Medicine

## 2013-10-05 MED ORDER — PREDNISONE 10 MG PO TABS: ORAL_TABLET | ORAL | Status: DC

## 2013-10-05 NOTE — Telephone Encounter (Signed)
Prednisone taper  Prednisone 10mg  4 tabs for 2 days, then 3 tabs for 2 days, 2 tabs for 2 days, then 1 tab for 2 days, then stop #20 , if not better needs ov  Please contact office for sooner follow up if symptoms do not improve or worsen or seek emergency care

## 2013-10-05 NOTE — Telephone Encounter (Signed)
Last ov 9.25.14 w/ TP for acute visit.  Pt reported that his symptoms are still lingering from ov w/ wheezing this morning, chest congestion, occ prod cough with white mucus and occasional pain on his right side.  Pt denies any f/c/s, tightness, SOB.  Though he is not much better since last ov, pt blames this on the weather and would like recs over the phone rather than ov.  [facility].  As pt has recently seen TP, will forward to TP for recs. Patient Instructions    Omnicef Twice daily for 7 days  Mucinex DM Twice daily As needed Cough/congestion  Prednisone taper over next week.  Fluids and rest  Please contact office for sooner follow up if symptoms do not improve or worsen or seek emergency care  Follow up in November as planned    Tammy please advise, thanks.

## 2013-10-05 NOTE — Telephone Encounter (Signed)
Pt aware of RX sent and to call if no better for OV.

## 2013-10-06 ENCOUNTER — Telehealth: Payer: Self-pay | Admitting: Gastroenterology

## 2013-10-06 NOTE — Telephone Encounter (Signed)
Spoke with patient-states he feels like his air supply gets cut off and cant understand what is causing it. Pt will come in to see MW tomorrow 10/07/13 at 2:00pm.

## 2013-10-06 NOTE — Telephone Encounter (Signed)
lmom for pt that we don't feel as though we can help his problem, he needs to check with his PCP.

## 2013-10-06 NOTE — Telephone Encounter (Signed)
Pt states he can't explain it, but sometimes when he is lying down on his left side, he feels like his air gets cut off; he can turn to his right and he can breathe again. Explained to pt I feel this is not our problem and he should discuss this with his PCP. Dr Sherene Sires, can you help with this pt? Thanks.

## 2013-10-06 NOTE — Telephone Encounter (Signed)
No obvious reason for this, will address at next ov, ok to move up ov if getting worse, in meantime just avoid this position

## 2013-10-07 ENCOUNTER — Encounter: Payer: Self-pay | Admitting: Internal Medicine

## 2013-10-07 ENCOUNTER — Ambulatory Visit (INDEPENDENT_AMBULATORY_CARE_PROVIDER_SITE_OTHER)
Admission: RE | Admit: 2013-10-07 | Discharge: 2013-10-07 | Disposition: A | Discharge status: Home / Self Care | Payer: Medicare Other | Source: Ambulatory Visit | Attending: Internal Medicine | Admitting: Internal Medicine

## 2013-10-07 ENCOUNTER — Ambulatory Visit (INDEPENDENT_AMBULATORY_CARE_PROVIDER_SITE_OTHER): Payer: Medicare Other | Admitting: Internal Medicine

## 2013-10-07 VITALS — BP 124/76 | HR 71 | Temp 97.7°F | Ht 68.0 in | Wt 199.4 lb

## 2013-10-07 DIAGNOSIS — K219 Gastro-esophageal reflux disease without esophagitis: Secondary | ICD-10-CM | POA: Diagnosis not present

## 2013-10-07 DIAGNOSIS — J45909 Unspecified asthma, uncomplicated: Secondary | ICD-10-CM

## 2013-10-07 DIAGNOSIS — R05 Cough: Secondary | ICD-10-CM | POA: Diagnosis not present

## 2013-10-07 MED ORDER — PREDNISONE (PAK) 10 MG PO TABS: ORAL_TABLET | ORAL | Status: DC

## 2013-10-07 NOTE — Progress Notes (Signed)
Subjective:    Patient ID: [name]    DOB: May 25, 1941   Brief patient profile:  72 yobm denies ever smoking but has lifelong asthma with severe chronic pattern and best FEV1 of around 2.2 liters documented 2002 followed in pulmonary for primary care also for hbp and hyperlipidemia.  HPI  04/24/2012 f/u ov/ [name], doing fine but lost calendar and has two empty inhalers with saba and one dulera with count 118. No purulent sputum. Denies needing saba daytime. No overt hb or sinus complaints. >>no changes / discussed adherence    07/30/13 Allergy eval rx complete x 4 years > d/c  08/13/2013 f/u ov/[name] re asthma/ hbp Chief Complaint  Patient presents with  . Asthma    Breathing is doing well. Reports coughing from time to time. Denies SOB, chest tightness or wheezing at this time.   rare need for saba hfa, even less for neb (maybe once a month) >no changes   09/16/2013 Acute OV  Pt complains of prod cough with white mucus x1 week - denies SOB,  tightness, hemoptysis, n/v, edema Complains over the last week. The cough and congestion have worsened. Now. He has very thick mucus, nasal drainage, and intermittent wheezing. Patient denies any hemoptysis, orthopnea, PND, or leg swelling.  Has had to use his rescue inhaler more often than usual. Patient has been using Mucinex DM without much help. rec Omnicef Twice daily  for 7 days  Mucinex DM Twice daily  As needed  Cough/congestion  Prednisone taper over next week.    10/07/2013 f/u ov/[name] re:  Sob  Chief Complaint  Patient presents with  . Acute Visit    Pt states when he lays on his left side he feels SOB and like his airway is being cut off- onset was approx 3 wks ago. He has cough at night- prod with white sputum.    confused with meds, all symptoms better p prednisone, has ventolin with  Count 46 says not using neb at all   No obvious day to day or daytime variabilty or assoc chronic cough or cp or  overt sinus or hb symptoms.  No unusual exp hx or h/o childhood pna/ asthma or knowledge of premature birth.  Sleeping ok without nocturnal  or early am exacerbation  of respiratory  c/o's or need for noct saba. Also denies any obvious fluctuation of symptoms with weather or environmental changes or other aggravating or alleviating factors except as outlined above   Current Medications, Allergies, Complete Past Medical History, Past Surgical History, Family History, and Social History were reviewed in [employer] record.  ROS  The following are not active complaints unless bolded sore throat, dysphagia, dental problems, itching, sneezing,  nasal congestion or excess/ purulent secretions, ear ache,   fever, chills, sweats, unintended wt loss, pleuritic or exertional cp, hemoptysis,  orthopnea pnd or leg swelling, presyncope, palpitations, heartburn, abdominal pain, anorexia, nausea, vomiting, diarrhea  or change in bowel or urinary habits, change in stools or urine, dysuria,hematuria,  rash, arthralgias, visual complaints, headache, numbness weakness or ataxia or problems with walking or coordination,  change in mood/affect or memory.                         Past Medical Hx ASTHMA (ICD-493.90)  --chronic severe asthma with best FEV1 2.2 L documented 01/2001 and  documented non-adherence Mainly lives off samples of inhalers and nose sprays  - HFA 75% June 15, 2009 >  90% August 02, 2009 > 100% December 27, 2009  - Off allergy vaccines x7/2010 > highly allergic skin tests per Dr Maple Hudson > restarted vaccine November 06, 2009  SHOULDER PAIN, RIGHT (ICD-719.41)   HYPERTENSION, BORDERLINE (ICD-401.9)  Shingles T 8 Left 9/01  HEALTH MAINTENANCE...............................................................................................Marland KitchenWert  - Colonoscopy 12/1989 , 2011  - dT 03/2005  - Pneumovax 3/04 and 05/2009  -Flu declined October 17, 2008 and September 21, 2009  -CPX 04/30/2013  - Meds reviewed  with pt education and computerized med calendar adjusted October 19, 2009, February 20, 2010, September 10, 2010 , 05/22/2012 , 03/01/2013            Objective:   Physical Exam  amb bm nad with very unsual affect, more child like than baseline  Wt Readings from Last 3 Encounters:  10/07/13 199 lb 6.4 oz (90.447 kg)  09/16/13 201 lb 3.2 oz (91.264 kg)  08/13/13 199 lb (90.266 kg)         HEENT: nl dentition, turbinates, and orophanx. Nl external ear canals without cough reflex, poor dentition    NECK :  without JVD/Nodes/TM/ nl carotid upstrokes bilaterally   LUNGS: no acc muscle use, hyperresonant to percussion,  Early  bilateral wheezing   CV:  RRR  no s3 or murmur or increase in P2, no edema   ABD:  soft and nontender with nl excursion in the supine position. No bruits or organomegaly, bowel sounds nl  MS:  Nl gait, warm without deformities, nl gait, no calf tenderness, cyanosis or clubbing      CXR  10/07/13 Chronic interstitial thickening which is likely related to the  clinical history of asthma/bronchitis. No acute superimposed  process. .       Assessment & Plan:

## 2013-10-07 NOTE — Patient Instructions (Addendum)
GERD (REFLUX)  is an extremely common cause of respiratory symptoms, many times with no significant heartburn at all.    It can be treated with medication, but also with lifestyle changes including avoidance of late meals, excessive alcohol, smoking cessation, and avoid fatty foods, chocolate, peppermint, colas, red wine, and acidic juices such as orange juice.  NO MINT OR MENTHOL PRODUCTS SO NO COUGH DROPS  USE SUGARLESS CANDY INSTEAD (jolley ranchers or Stover's)  NO OIL BASED VITAMINS - use powdered substitutes.  Only use your albuterol (ventolin) as a rescue medication to be used if you can't catch your breath by resting or doing a relaxed purse lip breathing pattern.  - The less you use it, the better it will work when you need it. - Ok to use up to every 4 hours if you must but call for immediate appointment if use goes up over your usual need - Don't leave home without it !!  (think of it like your spare tire for your car)  - only use albuterol neb if you tried ventolin first and it doesn't relieve your breathlessness   Prednisone 10 mg take  4 each am x 2 days,   2 each am x 2 days,  1 each am x 2 days and stop  Please remember to go to the  x-ray department downstairs for your tests - we will call you with the results when they are available.     See [name] w/in 2 weeks with all your medications, even over the counter meds, separated in two separate bags, the ones you take no matter what vs the ones you stop once you feel better and take only as needed when you feel you need them.   Tammy  will generate for you a new user friendly medication calendar that will put [place] all on the same page re: your medication use.     Without this process, it simply isn't possible to assure that we are providing  your outpatient care  with  the attention to detail we feel you deserve.   If we cannot assure that you're getting that kind of care,  then we cannot manage your problem effectively from this  clinic.  Once you have seen Tammy and we are sure that we're all on the same page with your medication use she will arrange follow up with me.

## 2013-10-08 NOTE — Progress Notes (Signed)
Quick Note:  Advised pt of CXR results per MW. Pt verbalized understanding and has no further questions at this time ______ 

## 2013-10-09 NOTE — Assessment & Plan Note (Signed)
--  chronic severe asthma with best FEV1 2.2 L documented 01/2001 and documented non-adherence Mainly lives off samples of inhalers and nose sprays - HFA 75% June 15, 2009 > 90% August 02, 2009 > 100% December 27, 2009  - Off allergy vaccines x7/2010 > highly allergic skin tests per Dr Maple Hudson > restart vaccine November 06, 2009>stop 07/30/13 to watch - Spirometry on "good day" 01/14/12 =  FEV1 1.54  Ratio 0.58  Symptoms remain difficult to control  DDX of  difficult airways managment all start with A and  include Adherence, Ace Inhibitors, Acid Reflux, Active Sinus Disease, Alpha 1 Antitripsin deficiency, Anxiety masquerading as Airways dz,  ABPA,  allergy(esp in young), Aspiration (esp in elderly), Adverse effects of DPI,  Active smokers, plus two Bs  = Bronchiectasis and Beta blocker use..and one C= CHF  Adherence is always the initial "prime suspect" and is a multilayered concern that requires a "trust but verify" approach in every patient - starting with knowing how to use medications, especially inhalers, correctly, keeping up with refills and understanding the fundamental difference between maintenance and prns vs those medications only taken for a very short course and then stopped and not refilled. This is the biggest obstacle and is related to insight and resources and has been a constant challenge here    Each maintenance medication was reviewed in detail including most importantly the difference between maintenance and as needed and under what circumstances the prns are to be used. This was done in the context of a medication calendar review which provided the patient with a user-friendly unambiguous mechanism for medication administration and reconciliation and provides an action plan for all active problems. It is critical that this be shown to every doctor  for modification during the office visit if necessary so the patient can use it as a working document.      rx pred x 6 days then regoup w/in  2 weeks to trust but verify than he is actually taking the meds he says he's on

## 2013-10-09 NOTE — Assessment & Plan Note (Signed)
Strongly suspect this is contributing to some of his upper airway/ atypical symptoms > reviewed diet and meds

## 2013-10-26 ENCOUNTER — Ambulatory Visit (INDEPENDENT_AMBULATORY_CARE_PROVIDER_SITE_OTHER): Payer: Medicare Other | Admitting: Adult Health

## 2013-10-26 ENCOUNTER — Encounter: Payer: Self-pay | Admitting: Adult Health

## 2013-10-26 VITALS — BP 116/78 | HR 85 | Temp 98.2°F | Ht 68.0 in | Wt 196.2 lb

## 2013-10-26 DIAGNOSIS — J45909 Unspecified asthma, uncomplicated: Secondary | ICD-10-CM | POA: Diagnosis not present

## 2013-10-26 MED ORDER — ALBUTEROL SULFATE HFA 108 (90 BASE) MCG/ACT IN AERS: 2.0000 | INHALATION_SPRAY | RESPIRATORY_TRACT | Status: DC | PRN

## 2013-10-26 MED ORDER — MOMETASONE FURO-FORMOTEROL FUM 200-5 MCG/ACT IN AERO: 2.0000 | INHALATION_SPRAY | Freq: Two times a day (BID) | RESPIRATORY_TRACT | Status: DC

## 2013-10-26 MED ORDER — MOMETASONE FUROATE 50 MCG/ACT NA SUSP: 1.0000 | Freq: Two times a day (BID) | NASAL | Status: DC

## 2013-10-26 NOTE — Progress Notes (Signed)
Subjective:    Patient ID: [name]    DOB: 1941/07/19   Brief patient profile:  72 yobm denies ever smoking but has lifelong asthma with severe chronic pattern and best FEV1 of around 2.2 liters documented 2002 followed in pulmonary for primary care also for hbp and hyperlipidemia.  HPI  04/24/2012 f/u ov/ wert, doing fine but lost calendar and has two empty inhalers with saba and one dulera with count 118. No purulent sputum. Denies needing saba daytime. No overt hb or sinus complaints. >>no changes / discussed adherence   07/30/13 Allergy eval rx complete x 4 years > d/c  08/13/2013 f/u ov/Wert re asthma/ hbp Chief Complaint  Patient presents with  . Asthma    Breathing is doing well. Reports coughing from time to time. Denies SOB, chest tightness or wheezing at this time.   rare need for saba hfa, even less for neb (maybe once a month) >no changes   09/16/2013 Acute OV  Pt complains of prod cough with white mucus x1 week - denies SOB,  tightness, hemoptysis, n/v, edema Complains over the last week. The cough and congestion have worsened. Now. He has very thick mucus, nasal drainage, and intermittent wheezing. Patient denies any hemoptysis, orthopnea, PND, or leg swelling.  Has had to use his rescue inhaler more often than usual. Patient has been using Mucinex DM without much help. rec Omnicef Twice daily  for 7 days  Mucinex DM Twice daily  As needed  Cough/congestion  Prednisone taper over next week.    10/07/2013 f/u ov/Wert re:  Sob  Chief Complaint  Patient presents with  . Acute Visit    Pt states when he lays on his left side he feels SOB and like his airway is being cut off- onset was approx 3 wks ago. He has cough at night- prod with white sputum.    confused with meds, all symptoms better p prednisone, has ventolin with  Count 46 says not using neb at all  >>pred taper   10/26/2013 Follow up and Med review  Pt returns for follow up and med review  Pt reviewed  all her meds and organized them into a med calendar with pt education Pt with asthma flare last ov, tx with steroid taper .  Feeling better back to his baseline Decreased SABA use. Ventolin on 41 today.  CXR last ov with no acute changes  Depends on samples greatly.  No chest pain , orthopnea, edema or fever.   Current Medications, Allergies, Complete Past Medical History, Past Surgical History, Family History, and Social History were reviewed in [employer] record.  ROS  The following are not active complaints unless bolded sore throat, dysphagia, dental problems, itching, sneezing,  nasal congestion or excess/ purulent secretions, ear ache,   fever, chills, sweats, unintended wt loss, pleuritic or exertional cp, hemoptysis,  orthopnea pnd or leg swelling, presyncope, palpitations, heartburn, abdominal pain, anorexia, nausea, vomiting, diarrhea  or change in bowel or urinary habits, change in stools or urine, dysuria,hematuria,  rash, arthralgias, visual complaints, headache, numbness weakness or ataxia or problems with walking or coordination,  change in mood/affect or memory.                         Past Medical Hx ASTHMA (ICD-493.90)  --chronic severe asthma with best FEV1 2.2 L documented 01/2001 and  documented non-adherence Mainly lives off samples of inhalers and nose sprays  - HFA 75%  June 15, 2009 > 90% August 02, 2009 > 100% December 27, 2009  - Off allergy vaccines x7/2010 > highly allergic skin tests per Dr Maple Hudson > restarted vaccine November 06, 2009  SHOULDER PAIN, RIGHT (ICD-719.41)   HYPERTENSION, BORDERLINE (ICD-401.9)  Shingles T 8 Left 9/01  HEALTH MAINTENANCE...............................................................................................Marland KitchenWert  - Colonoscopy 12/1989 , 2011  - dT 03/2005  - Pneumovax 3/04 and 05/2009  -Flu declined October 17, 2008 and September 21, 2009  -CPX 04/30/2013  - Meds reviewed with pt education and  computerized med calendar adjusted October 19, 2009, February 20, 2010, September 10, 2010 , 05/22/2012 , 03/01/2013, 10/26/2013            Objective:   Physical Exam  amb bm nad       HEENT: nl dentition, turbinates, and orophanx. Nl external ear canals without cough reflex, poor dentition    NECK :  without JVD/Nodes/TM/ nl carotid upstrokes bilaterally   LUNGS: no acc muscle use, hyperresonant to percussion,  No wheezing   CV:  RRR  no s3 or murmur or increase in P2, no edema   ABD:  soft and nontender with nl excursion in the supine position. No bruits or organomegaly, bowel sounds nl  MS:  Nl gait, warm without deformities, nl gait, no calf tenderness, cyanosis or clubbing      CXR  10/07/13 Chronic interstitial thickening which is likely related to the  clinical history of asthma/bronchitis. No acute superimposed  process. .       Assessment & Plan:

## 2013-10-26 NOTE — Patient Instructions (Signed)
Follow up in 6 weeks with Dr. Sherene Sires  And As needed   Follow med calenadar closely and bring to each visit.

## 2013-10-26 NOTE — Assessment & Plan Note (Addendum)
Recurrent flare - now improving from recent flare  Patient's medications were reviewed today and patient education was given. Computerized medication calendar was adjusted/completed   Plan  Cont on current regime  follow up 6 week with Dr. Sherene Sires

## 2013-11-12 ENCOUNTER — Encounter: Payer: Medicare Other | Admitting: Adult Health

## 2013-11-22 ENCOUNTER — Ambulatory Visit (INDEPENDENT_AMBULATORY_CARE_PROVIDER_SITE_OTHER): Payer: Medicare Other | Admitting: Internal Medicine

## 2013-11-22 ENCOUNTER — Encounter: Payer: Self-pay | Admitting: Internal Medicine

## 2013-11-22 ENCOUNTER — Ambulatory Visit (INDEPENDENT_AMBULATORY_CARE_PROVIDER_SITE_OTHER)
Admission: RE | Admit: 2013-11-22 | Discharge: 2013-11-22 | Disposition: A | Discharge status: Home / Self Care | Payer: Medicare Other | Source: Ambulatory Visit | Attending: Internal Medicine | Admitting: Internal Medicine

## 2013-11-22 ENCOUNTER — Telehealth: Payer: Self-pay | Admitting: Internal Medicine

## 2013-11-22 ENCOUNTER — Other Ambulatory Visit (INDEPENDENT_AMBULATORY_CARE_PROVIDER_SITE_OTHER): Payer: Medicare Other

## 2013-11-22 VITALS — BP 112/70 | HR 90 | Temp 98.0°F | Ht 68.0 in | Wt 198.0 lb

## 2013-11-22 DIAGNOSIS — J9819 Other pulmonary collapse: Secondary | ICD-10-CM | POA: Diagnosis not present

## 2013-11-22 DIAGNOSIS — J069 Acute upper respiratory infection, unspecified: Secondary | ICD-10-CM | POA: Insufficient documentation

## 2013-11-22 DIAGNOSIS — H524 Presbyopia: Secondary | ICD-10-CM | POA: Diagnosis not present

## 2013-11-22 DIAGNOSIS — J45909 Unspecified asthma, uncomplicated: Secondary | ICD-10-CM

## 2013-11-22 DIAGNOSIS — H04219 Epiphora due to excess lacrimation, unspecified lacrimal gland: Secondary | ICD-10-CM | POA: Diagnosis not present

## 2013-11-22 DIAGNOSIS — H40019 Open angle with borderline findings, low risk, unspecified eye: Secondary | ICD-10-CM | POA: Diagnosis not present

## 2013-11-22 DIAGNOSIS — H04129 Dry eye syndrome of unspecified lacrimal gland: Secondary | ICD-10-CM | POA: Diagnosis not present

## 2013-11-22 LAB — CBC WITH DIFFERENTIAL/PLATELET
Basophils Relative: 0.3 % (ref 0.0–3.0)
Eosinophils Relative: 5.6 % — ABNORMAL HIGH (ref 0.0–5.0)
HCT: 42.5 % (ref 39.0–52.0)
Hemoglobin: 14.2 g/dL (ref 13.0–17.0)
Lymphs Abs: 2.7 10*3/uL (ref 0.7–4.0)
MCV: 87.3 fl (ref 78.0–100.0)
Monocytes Absolute: 0.9 10*3/uL (ref 0.1–1.0)
Monocytes Relative: 7.2 % (ref 3.0–12.0)
Neutro Abs: 8 10*3/uL — ABNORMAL HIGH (ref 1.4–7.7)
Neutrophils Relative %: 65.3 % (ref 43.0–77.0)
Platelets: 307 10*3/uL (ref 150.0–400.0)
RBC: 4.86 Mil/uL (ref 4.22–5.81)
WBC: 12.3 10*3/uL — ABNORMAL HIGH (ref 4.5–10.5)

## 2013-11-22 LAB — BASIC METABOLIC PANEL
BUN: 10 mg/dL (ref 6–23)
CO2: 28 mEq/L (ref 19–32)
Calcium: 9.2 mg/dL (ref 8.4–10.5)
Creatinine, Ser: 1.3 mg/dL (ref 0.4–1.5)
GFR: 67.68 mL/min (ref >60.00)
Glucose, Bld: 85 mg/dL (ref 70–99)
Sodium: 142 mEq/L (ref 135–145)

## 2013-11-22 MED ORDER — AZITHROMYCIN 250 MG PO TABS: ORAL_TABLET | ORAL | Status: DC

## 2013-11-22 MED ORDER — PREDNISONE (PAK) 10 MG PO TABS: ORAL_TABLET | ORAL | Status: DC

## 2013-11-22 MED ORDER — PHENYLEPH-PROMETHAZINE-COD 5-6.25-10 MG/5ML PO SYRP: 10.0000 mL | ORAL_SOLUTION | Freq: Four times a day (QID) | ORAL | Status: DC | PRN

## 2013-11-22 NOTE — Progress Notes (Signed)
Subjective:    Patient ID: [name]    DOB: 1941/02/09   Brief patient profile:  72 yobm denies ever smoking but has lifelong asthma with severe chronic pattern and best FEV1 of around 2.2 liters documented 2002 followed in pulmonary for primary care also for hbp and hyperlipidemia.  HPI  04/24/2012 f/u ov/ Azaylia Fong, doing fine but lost calendar and has two empty inhalers with saba and one dulera with count 118. No purulent sputum. Denies needing saba daytime. No overt hb or sinus complaints. >>no changes / discussed adherence   07/30/13 Allergy eval rx complete x 4 years > d/c  08/13/2013 f/u ov/Frisco Cordts re asthma/ hbp Chief Complaint  Patient presents with  . Asthma    Breathing is doing well. Reports coughing from time to time. Denies SOB, chest tightness or wheezing at this time.   rare need for saba hfa, even less for neb (maybe once a month) >no changes   09/16/2013 Acute OV  Pt complains of prod cough with white mucus x1 week - denies SOB,  tightness, hemoptysis, n/v, edema Complains over the last week. The cough and congestion have worsened. Now. He has very thick mucus, nasal drainage, and intermittent wheezing. Patient denies any hemoptysis, orthopnea, PND, or leg swelling.  Has had to use his rescue inhaler more often than usual. Patient has been using Mucinex DM without much help. rec Omnicef Twice daily  for 7 days  Mucinex DM Twice daily  As needed  Cough/congestion  Prednisone taper over next week.    10/07/2013 f/u ov/Saanvika Vazques re:  Sob  Chief Complaint  Patient presents with  . Acute Visit    Pt states when he lays on his left side he feels SOB and like his airway is being cut off- onset was approx 3 wks ago. He has cough at night- prod with white sputum.    confused with meds, all symptoms better p prednisone, has ventolin with  Count 46 says not using neb at all  >>pred taper   10/26/2013 Follow up and Med review  Pt returns for follow up and med review  Pt reviewed  all her meds and organized them into a med calendar with pt education Pt with asthma flare last ov, tx with steroid taper .  Feeling better back to his baseline Decreased SABA use. Ventolin on 41 today.  CXR last ov with no acute changes  Depends on samples greatly.  No chest pain , orthopnea, edema or fever.  rec  No change rx    11/22/2013 acute ov/Evania Lyne re: asthma exac  Chief Complaint  Patient presents with  . Acute Visit    Pt c/o prod cough with large amounts of green sputum x 3 days- he also reports having minimal increase wheezing and SOB.   cough so hard vomited earlier today. Still not using much saba. Onset of symptoms was abrupt with mild sore throat.  No ha, rigors, arthralgias  No obvious day to day or daytime variabilty or assoc  cp or chest tightness, overt sinus or hb symptoms. No unusual exp hx or h/o childhood pna/ asthma or knowledge of premature birth.  Sleeping ok without nocturnal  or early am exacerbation  of respiratory  c/o's or need for noct saba. Also denies any obvious fluctuation of symptoms with weather or environmental changes or other aggravating or alleviating factors except as outlined above   Current Medications, Allergies, Complete Past Medical History, Past Surgical History, Family History, and Social History were  reviewed in [employer] electronic medical record.  ROS  The following are not active complaints unless bolded sore throat, dysphagia, dental problems, itching, sneezing,  nasal congestion or excess/ purulent secretions, ear ache,   fever, chills, sweats, unintended wt loss, pleuritic or exertional cp, hemoptysis,  orthopnea pnd or leg swelling, presyncope, palpitations, heartburn, abdominal pain, anorexia, nausea, vomiting, diarrhea  or change in bowel or urinary habits, change in stools or urine, dysuria,hematuria,  rash, arthralgias, visual complaints, headache, numbness weakness or ataxia or problems with walking or coordination,  change  in mood/affect or memory.         Past Medical Hx ASTHMA (ICD-493.90)  --chronic severe asthma with best FEV1 2.2 L documented 01/2001 and  documented non-adherence Mainly lives off samples of inhalers and nose sprays  - HFA 75% June 15, 2009 > 90% August 02, 2009 > 100% December 27, 2009  - Off allergy vaccines x7/2010 > highly allergic skin tests per Dr Maple Hudson > restarted vaccine November 06, 2009  SHOULDER PAIN, RIGHT (ICD-719.41)   HYPERTENSION, BORDERLINE (ICD-401.9)  Shingles T 8 Left 9/01  HEALTH MAINTENANCE...............................................................................................Marland KitchenWert  - Colonoscopy 12/1989 , 2011  - dT 03/2005  - Pneumovax 3/04 and 05/2009  -Flu declined October 17, 2008 and September 21, 2009  -CPX 04/30/2013  - Meds reviewed with pt education and computerized med calendar adjusted October 19, 2009, February 20, 2010, September 10, 2010 , 05/22/2012 , 03/01/2013, 10/26/2013            Objective:   Physical Exam  amb bm nad    Wt Readings from Last 3 Encounters:  11/22/13 198 lb (89.812 kg)  10/26/13 196 lb 3.2 oz (88.996 kg)  10/07/13 199 lb 6.4 oz (90.447 kg)         HEENT: nl dentition, turbinates, and orophanx. Nl external ear canals without cough reflex, poor dentition    NECK :  without JVD/Nodes/TM/ nl carotid upstrokes bilaterally   LUNGS: no acc muscle use, hyperresonant to percussion,  Mid bilateral exp wheezing   CV:  RRR  no s3 or murmur or increase in P2, no edema   ABD:  soft and nontender with nl excursion in the supine position. No bruits or organomegaly, bowel sounds nl  MS:  Nl gait, warm without deformities, nl gait, no calf tenderness, cyanosis or clubbing      CXR  11/22/2013 : Increased peribronchial thickening with minimal atelectasis at the left lung base. Findings are consistent with bronchitis       Assessment & Plan:

## 2013-11-22 NOTE — Assessment & Plan Note (Signed)
rz empricially with zpak as no pna on cxr

## 2013-11-22 NOTE — Assessment & Plan Note (Signed)
--  chronic severe asthma with best FEV1 2.2 L documented 01/2001 and documented non-adherence Mainly lives off samples of inhalers and nose sprays - HFA 75% June 15, 2009 > 90% August 02, 2009 > 100% December 27, 2009  - Off allergy vaccines x7/2010 > highly allergic skin tests per Dr Maple Hudson > restart vaccine November 06, 2009>stop 07/30/13 to watch - Spirometry on "good day" 01/14/12 =  FEV1 1.54  Ratio 0.58  Despite typical trigger (URI) not using saba as per action plan on med calendar and has no albuterol neb solution   rx Prednisone 10 mg take  4 each am x 2 days,   2 each am x 2 days,  1 each am x 2 days and stop     Each maintenance medication was reviewed in detail including most importantly the difference between maintenance and as needed and under what circumstances the prns are to be used. This was done in the context of a medication calendar review which provided the patient with a user-friendly unambiguous mechanism for medication administration and reconciliation and provides an action plan for all active problems. It is critical that this be shown to every doctor  for modification during the office visit if necessary so the patient can use it as a working document.

## 2013-11-22 NOTE — Telephone Encounter (Signed)
If he's vomiting there's really no way to try to treat him over the phone or even in the clinic and he should go to er - if feeling good enough to come to office and no longer vomiting then add him on to my schedule today

## 2013-11-22 NOTE — Telephone Encounter (Signed)
appt set for today at 10:45. Carron Curie, CMA

## 2013-11-22 NOTE — Patient Instructions (Addendum)
zpak Prednisone 10 mg take  4 each am x 2 days,   2 each am x 2 days,  1 each am x 2 days and stop  For nausea or severe cough/ gagging > Phenergan vc with codeine 2 tsp every 4 hours   See calendar for specific medication instructions and bring it back for each and every office visit for every healthcare provider you see.  Without it,  you may not receive the best quality medical care that we feel you deserve.  You will note that the calendar groups together  your maintenance  medications that are timed at particular times of the day.  Think of this as your checklist for what your doctor has instructed you to do until your next evaluation to see what benefit  there is  to staying on a consistent group of medications intended to keep you well.  The other group at the bottom is entirely up to you to use as you see fit  for specific symptoms that may arise between visits that require you to treat them on an as needed basis.  Think of this as your action plan or "what if" list.   Separating the top medications from the bottom group is fundamental to providing you adequate care going forward.    Please remember to go to the  Lab and  x-ray department downstairs for your tests - we will call you with the results when they are available.  Keep previous appt > if get worse go to ER

## 2013-11-22 NOTE — Progress Notes (Signed)
Quick Note:  Spoke with pt and notified of results per Dr. Wert. Pt verbalized understanding and denied any questions.  ______ 

## 2013-11-22 NOTE — Telephone Encounter (Signed)
Called spoke with patient who c/o prod cough with yellow/green mucus, some wheezing, increased SOB, some head congestion, some f/c/s x2 days, does report nausea and vomiting x1 last night.  Denies PND, hemoptysis, edema, chest tightness.  Last ov 11.4.14 w/ TP for med calendar Upcoming ov w/ MW 12.16.14 NKDA - verified [facility]  Dr Sherene Sires please advise, thank you.

## 2013-12-06 ENCOUNTER — Encounter: Payer: Self-pay | Admitting: *Deleted

## 2013-12-07 ENCOUNTER — Encounter: Payer: Self-pay | Admitting: Internal Medicine

## 2013-12-07 ENCOUNTER — Ambulatory Visit (INDEPENDENT_AMBULATORY_CARE_PROVIDER_SITE_OTHER): Payer: Medicare Other | Admitting: Internal Medicine

## 2013-12-07 VITALS — BP 148/80 | HR 70 | Temp 97.5°F | Ht 68.0 in | Wt 200.0 lb

## 2013-12-07 DIAGNOSIS — J45909 Unspecified asthma, uncomplicated: Secondary | ICD-10-CM | POA: Diagnosis not present

## 2013-12-07 NOTE — Patient Instructions (Addendum)
See calendar for specific medication instructions and bring it back for each and every office visit for every healthcare provider you see.  Without it,  you may not receive the best quality medical care that we feel you deserve.  You will note that the calendar groups together  your maintenance  medications that are timed at particular times of the day.  Think of this as your checklist for what your doctor has instructed you to do until your next evaluation to see what benefit  there is  to staying on a consistent group of medications intended to keep you well.  The other group at the bottom is entirely up to you to use as you see fit  for specific symptoms that may arise between visits that require you to treat them on an as needed basis.  Think of this as your action plan or "what if" list.   Separating the top medications from the bottom group is fundamental to providing you adequate care going forward.    Please schedule a follow up office visit in 6 weeks, call sooner if needed to see [name]

## 2013-12-07 NOTE — Progress Notes (Signed)
Subjective:    Patient ID: [name]    DOB: 02/01/1941   Brief patient profile:  72 yobm denies ever smoking but has lifelong asthma with severe chronic pattern and best FEV1 of around 2.2 liters documented 2002 followed in pulmonary for primary care also for hbp and hyperlipidemia.  HPI  04/24/2012 f/u ov/ [name], doing fine but lost calendar and has two empty inhalers with saba and one dulera with count 118. No purulent sputum. Denies needing saba daytime. No overt hb or sinus complaints. >>no changes / discussed adherence   07/30/13 Allergy eval rx complete x 4 years > d/c and f/u allergy prn   08/13/2013 f/u ov/[name] re asthma/ hbp Chief Complaint  Patient presents with  . Asthma    Breathing is doing well. Reports coughing from time to time. Denies SOB, chest tightness or wheezing at this time.   rare need for saba hfa, even less for neb (maybe once a month) >no changes   09/16/2013 Acute OV  Pt complains of prod cough with white mucus x1 week - denies SOB,  tightness, hemoptysis, n/v, edema Complains over the last week. The cough and congestion have worsened. Now. He has very thick mucus, nasal drainage, and intermittent wheezing. Patient denies any hemoptysis, orthopnea, PND, or leg swelling.  Has had to use his rescue inhaler more often than usual. Patient has been using Mucinex DM without much help. rec Omnicef Twice daily  for 7 days  Mucinex DM Twice daily  As needed  Cough/congestion  Prednisone taper over next week.    10/07/2013 f/u ov/[name] re:  Sob  Chief Complaint  Patient presents with  . Acute Visit    Pt states when he lays on his left side he feels SOB and like his airway is being cut off- onset was approx 3 wks ago. He has cough at night- prod with white sputum.    confused with meds, all symptoms better p prednisone, has ventolin with  Count 46 says not using neb at all  >>pred taper   10/26/2013 Follow up and Med review  Pt returns for follow up and med  review  Pt reviewed all her meds and organized them into a med calendar with pt education Pt with asthma flare last ov, tx with steroid taper .  Feeling better back to his baseline Decreased SABA use. Ventolin on 41 today.  CXR last ov with no acute changes  Depends on samples greatly.  No chest pain , orthopnea, edema or fever.  rec  No change rx    11/22/2013 acute ov/[name] re: asthma exac  Chief Complaint  Patient presents with  . Acute Visit    Pt c/o prod cough with large amounts of green sputum x 3 days- he also reports having minimal increase wheezing and SOB.   cough so hard vomited earlier today. Still not using much saba. Onset of symptoms was abrupt with mild sore throat.  No ha, rigors, arthralgias rec zpak Prednisone 10 mg take  4 each am x 2 days,   2 each am x 2 days,  1 each am x 2 days and stop For nausea or severe cough/ gagging > Phenergan vc with codeine 2 tsp every 4 hours  See calendar   12/07/2013 f/u ov/[name] re: severe chronic asthma  Chief Complaint  Patient presents with  . Follow-up    Pt states breathing much better, back at baseline. He states has minimal wheezing and cough. Cough is prod with  minimal clear sputum.    No need at all for prns No increase itching sneezing off allergy shots since 07/2013 Denies problems filling rx and following med calendar   No obvious day to day or daytime variabilty or assoc  cp or chest tightness, overt sinus or hb symptoms. No unusual exp hx or h/o childhood pna/ asthma or knowledge of premature birth.  Sleeping ok without nocturnal  or early am exacerbation  of respiratory  c/o's or need for noct saba. Also denies any obvious fluctuation of symptoms with weather or environmental changes or other aggravating or alleviating factors except as outlined above   Current Medications, Allergies, Complete Past Medical History, Past Surgical History, Family History, and Social History were reviewed in [employer] record.  ROS  The following are not active complaints unless bolded sore throat, dysphagia, dental problems, itching, sneezing,  nasal congestion or excess/ purulent secretions, ear ache,   fever, chills, sweats, unintended wt loss, pleuritic or exertional cp, hemoptysis,  orthopnea pnd or leg swelling, presyncope, palpitations, heartburn, abdominal pain, anorexia, nausea, vomiting, diarrhea  or change in bowel or urinary habits, change in stools or urine, dysuria,hematuria,  rash, arthralgias, visual complaints, headache, numbness weakness or ataxia or problems with walking or coordination,  change in mood/affect or memory.         Past Medical Hx ASTHMA (ICD-493.90)  --chronic severe asthma with best FEV1 2.2 L documented 01/2001 and  documented non-adherence Mainly lives off samples of inhalers and nose sprays  - HFA 75% June 15, 2009 > 90% August 02, 2009 > 100% December 27, 2009  - Off allergy vaccines x7/2010 > highly allergic skin tests per Dr [name] > restarted vaccine November 06, 2009 > stopped  07/2013  SHOULDER PAIN, RIGHT (ICD-719.41)   HYPERTENSION, BORDERLINE (ICD-401.9)  Shingles T 8 Left 9/01  HEALTH MAINTENANCE...............................................................................................Marland KitchenWert  - Colonoscopy 12/1989 , 2011  - dT 03/2005  - Pneumovax 3/04 and 05/2009  -Flu declined October 17, 2008 and September 21, 2009  -CPX 04/30/2013  - Meds reviewed with pt education and computerized med calendar adjusted October 19, 2009, February 20, 2010, September 10, 2010 , 05/22/2012 , 03/01/2013, 10/26/2013            Objective:   Physical Exam  amb bm nad    Wt Readings from Last 3 Encounters:  12/07/13 200 lb (90.719 kg)  11/22/13 198 lb (89.812 kg)  10/26/13 196 lb 3.2 oz (88.996 kg)        HEENT: nl dentition, turbinates, and orophanx. Nl external ear canals without cough reflex, poor dentition    NECK :  without JVD/Nodes/TM/ nl carotid  upstrokes bilaterally   LUNGS: no acc muscle use, hyperresonant to percussion,  Min late bilateral exp wheezing   CV:  RRR  no s3 or murmur or increase in P2, no edema   ABD:  soft and nontender with nl excursion in the supine position. No bruits or organomegaly, bowel sounds nl  MS:  Nl gait, warm without deformities, nl gait, no calf tenderness, cyanosis or clubbing      CXR  11/22/2013 : Increased peribronchial thickening with minimal atelectasis at the left lung base. Findings are consistent with bronchitis       Assessment & Plan:

## 2013-12-07 NOTE — Assessment & Plan Note (Signed)
--  chronic severe asthma with best FEV1 2.2 L documented 01/2001 and documented non-adherence Mainly lives off samples of inhalers and nose sprays - HFA 75% June 15, 2009 > 90% August 02, 2009 > 100% December 27, 2009  - Off allergy vaccines x7/2010 > highly allergic skin tests per Dr Maple Hudson > restart vaccine November 06, 2009>stop 07/30/13 to watch - Spirometry on "good day" 01/14/12 =  FEV1 1.54  Ratio 0.58  Adequate control on present rx, reviewed > no change in rx needed      Each maintenance medication was reviewed in detail including most importantly the difference between maintenance and as needed and under what circumstances the prns are to be used. This was done in the context of a medication calendar review which provided the patient with a user-friendly unambiguous mechanism for medication administration and reconciliation and provides an action plan for all active problems. It is critical that this be shown to every doctor  for modification during the office visit if necessary so the patient can use it as a working document.

## 2013-12-10 ENCOUNTER — Ambulatory Visit (INDEPENDENT_AMBULATORY_CARE_PROVIDER_SITE_OTHER): Payer: Medicare Other | Admitting: Gastroenterology

## 2013-12-10 ENCOUNTER — Encounter: Payer: Self-pay | Admitting: Gastroenterology

## 2013-12-10 VITALS — BP 170/90 | HR 84 | Ht 67.5 in | Wt 198.2 lb

## 2013-12-10 DIAGNOSIS — I10 Essential (primary) hypertension: Secondary | ICD-10-CM | POA: Diagnosis not present

## 2013-12-10 DIAGNOSIS — Z860100 Personal history of colon polyps, unspecified: Secondary | ICD-10-CM

## 2013-12-10 DIAGNOSIS — K219 Gastro-esophageal reflux disease without esophagitis: Secondary | ICD-10-CM | POA: Diagnosis not present

## 2013-12-10 MED ORDER — LANSOPRAZOLE 30 MG PO CPDR: 30.0000 mg | DELAYED_RELEASE_CAPSULE | Freq: Every day | ORAL | Status: DC

## 2013-12-10 NOTE — Patient Instructions (Signed)
Please follow up in one year  New prescription for Lansoprazole was sent to your pharmacy   You will be due for a recall colonoscopy in May 2016. We will send you a reminder in the mail when it gets closer to that time.

## 2013-12-10 NOTE — Progress Notes (Signed)
This is a 72 year old African American male who has chronic acid reflux and a large hiatal hernia.  He is currently doing well on Prevacid 30 mg a day.  He denies burning substernal chest pain, dysphagia, any hepatobiliary or lower gastrointestinal complaints.  Review of his record shows he had multiple polyps removed by Dr. Carman Ching 4 years ago.  He is due for followup colonoscopy in may of 2016.  His only other medical problem is mild asthmatic bronchitis requiring inhalers, and essential hypertension.  He is having regular bowel movements without melena or hematochezia.  His appetite is good and his weight is stable.  Current Medications, Allergies, Past Medical History, Past Surgical History, Family History and Social History were reviewed in [employer] record.  ROS: All systems were reviewed and are negative unless otherwise stated in the HPI.          Physical Exam: Blood pressure 170/90, pulse 84 and regular and weight 198 with a BMI of 30.57.  Nonsmoker.  Chest is clear except for anterior wheezes bilaterally.  He appears to be in a regular rhythm without murmurs gallops or rubs.  There is no hepatosplenomegaly, abdominal masses or tenderness.  Bowel sounds are normal.  Mental status is normal.    Assessment and Plan: I renewed his Prevacid 30 mg a day for his acid reflux which seems to be under good control.  We will put him in our system for followup colonoscopy in may of 2016.  I also will send a copy this note to Dr. Sandrea Hughs per his hypertension.  Standard antireflux maneuvers reviewed with patient.  Cc Dr Sandrea Hughs

## 2013-12-24 ENCOUNTER — Telehealth: Payer: Self-pay | Admitting: Internal Medicine

## 2013-12-24 MED ORDER — PREDNISONE 10 MG PO TABS: ORAL_TABLET | ORAL | Status: DC

## 2013-12-24 MED ORDER — AZITHROMYCIN 250 MG PO TABS: ORAL_TABLET | ORAL | Status: DC

## 2013-12-24 NOTE — Telephone Encounter (Signed)
Called, spoke with pt.  Informed him of below recs per Dr. Melvyn Novas.  He verbalized understanding of this, is aware rxs sent to [facility], and is to call back if symptoms do not improve or worsen and seek emergency care if needed.

## 2013-12-24 NOTE — Telephone Encounter (Signed)
Prednisone 10 mg take  4 each am x 2 days,   2 each am x 2 days,  1 each am x 2 days and stop zpak 

## 2013-12-24 NOTE — Telephone Encounter (Signed)
Called and spoke with pt and he stated that he has a cold/cough with clear sputum, sneezing.  Denies any fever, body aches or nasal congestion.  This has been going on x 4 days.  Pt is calling for MW recs.  MW please advise. Thanks  No Known Allergies   Current Outpatient Prescriptions on File Prior to Visit  Medication Sig Dispense Refill  . albuterol (PROAIR HFA) 108 (90 BASE) MCG/ACT inhaler Inhale 2 puffs into the lungs every 4 (four) hours as needed for wheezing or shortness of breath (((PLAN B))).  18 g  5  . albuterol (PROVENTIL) (2.5 MG/3ML) 0.083% nebulizer solution 1 treatment every 4-6 hours as needed for wheezing ((PLAN C))      . aspirin 81 MG EC tablet Take 81 mg by mouth daily.        . cetirizine (ZYRTEC) 10 MG tablet Take 10 mg by mouth daily as needed for allergies.      . famotidine (PEPCID) 20 MG tablet Take 1 tablet (20 mg total) by mouth at bedtime.      Marland Kitchen guaifenesin (MUCUS RELIEF) 400 MG TABS 2-3 every 12 hours as needed for cough      . lansoprazole (PREVACID) 30 MG capsule Take 1 capsule (30 mg total) by mouth daily before breakfast.  30 capsule  6  . mometasone (NASONEX) 50 MCG/ACT nasal spray Place 1 spray into the nose 2 (two) times daily.  17 g  5  . mometasone-formoterol (DULERA) 200-5 MCG/ACT AERO Inhale 2 puffs into the lungs 2 (two) times daily.  13 g  5  . Naproxen Sodium (ALEVE) 220 MG CAPS Per bottle as needed for joint pain      . olmesartan (BENICAR) 40 MG tablet Take 1/2 tablet daily      . oxymetazoline (AFRIN) 0.05 % nasal spray 2 puffs twice daily x 5 days, off x 2 weeks      . Phenyleph-Promethazine-Cod 5-6.25-10 MG/5ML SYRP Take 10 mLs by mouth 4 (four) times daily as needed.  180 mL  0  . Respiratory Therapy Supplies (FLUTTER) DEVI Use every 4 hours as needed      . Simethicone (GAS-X EXTRA STRENGTH) 125 MG CAPS Per box as needed      . [DISCONTINUED] pantoprazole (PROTONIX) 40 MG tablet Take 1 tablet (40 mg total) by mouth daily.  30 tablet  11    No current facility-administered medications on file prior to visit.

## 2014-01-13 DIAGNOSIS — H40019 Open angle with borderline findings, low risk, unspecified eye: Secondary | ICD-10-CM | POA: Diagnosis not present

## 2014-01-13 DIAGNOSIS — Z961 Presence of intraocular lens: Secondary | ICD-10-CM | POA: Diagnosis not present

## 2014-01-13 DIAGNOSIS — H524 Presbyopia: Secondary | ICD-10-CM | POA: Diagnosis not present

## 2014-01-13 DIAGNOSIS — H04129 Dry eye syndrome of unspecified lacrimal gland: Secondary | ICD-10-CM | POA: Diagnosis not present

## 2014-01-13 DIAGNOSIS — H04219 Epiphora due to excess lacrimation, unspecified lacrimal gland: Secondary | ICD-10-CM | POA: Diagnosis not present

## 2014-01-18 ENCOUNTER — Encounter: Payer: Self-pay | Admitting: Adult Health

## 2014-01-18 ENCOUNTER — Ambulatory Visit (INDEPENDENT_AMBULATORY_CARE_PROVIDER_SITE_OTHER): Payer: Medicare Other | Admitting: Adult Health

## 2014-01-18 VITALS — BP 142/72 | HR 74 | Temp 97.4°F | Ht 67.5 in | Wt 204.0 lb

## 2014-01-18 DIAGNOSIS — J45901 Unspecified asthma with (acute) exacerbation: Secondary | ICD-10-CM

## 2014-01-18 DIAGNOSIS — J45909 Unspecified asthma, uncomplicated: Secondary | ICD-10-CM

## 2014-01-18 MED ORDER — PREDNISONE 10 MG PO TABS: ORAL_TABLET | ORAL | Status: DC

## 2014-01-18 MED ORDER — LEVALBUTEROL HCL 0.63 MG/3ML IN NEBU
0.6300 mg | INHALATION_SOLUTION | Freq: Once | RESPIRATORY_TRACT | Status: AC
  Administered 2014-01-18: 0.63 mg via RESPIRATORY_TRACT

## 2014-01-18 NOTE — Patient Instructions (Signed)
Prednisone taper over next week.  Continue on current regimen   Please contact office for sooner follow up if symptoms do not improve or worsen or seek emergency care  follow up Dr. Melvyn Novas  In 6 weeks and As needed

## 2014-01-18 NOTE — Progress Notes (Signed)
Subjective:    Patient ID: [name]    DOB: 05-12-1941   Brief patient profile:  87 yobm denies ever smoking but has lifelong asthma with severe chronic pattern and best FEV1 of around 2.2 liters documented 2002 followed in pulmonary for primary care also for hbp and hyperlipidemia.  HPI  04/24/2012 f/u ov/ wert, doing fine but lost calendar and has two empty inhalers with saba and one dulera with count 118. No purulent sputum. Denies needing saba daytime. No overt hb or sinus complaints. >>no changes / discussed adherence   07/30/13 Allergy eval rx complete x 4 years > d/c and f/u allergy prn   08/13/2013 f/u ov/Wert re asthma/ hbp Chief Complaint  Patient presents with  . Asthma    Breathing is doing well. Reports coughing from time to time. Denies SOB, chest tightness or wheezing at this time.   rare need for saba hfa, even less for neb (maybe once a month) >no changes   09/16/2013 Acute OV  Pt complains of prod cough with white mucus x1 week - denies SOB,  tightness, hemoptysis, n/v, edema Complains over the last week. The cough and congestion have worsened. Now. He has very thick mucus, nasal drainage, and intermittent wheezing. Patient denies any hemoptysis, orthopnea, PND, or leg swelling.  Has had to use his rescue inhaler more often than usual. Patient has been using Mucinex DM without much help. rec Omnicef Twice daily  for 7 days  Mucinex DM Twice daily  As needed  Cough/congestion  Prednisone taper over next week.    10/07/2013 f/u ov/Wert re:  Sob  Chief Complaint  Patient presents with  . Acute Visit    Pt states when he lays on his left side he feels SOB and like his airway is being cut off- onset was approx 3 wks ago. He has cough at night- prod with white sputum.    confused with meds, all symptoms better p prednisone, has ventolin with  Count 46 says not using neb at all  >>pred taper   10/26/2013 Follow up and Med review  Pt returns for follow up and med  review  Pt reviewed all her meds and organized them into a med calendar with pt education Pt with asthma flare last ov, tx with steroid taper .  Feeling better back to his baseline Decreased SABA use. Ventolin on 41 today.  CXR last ov with no acute changes  Depends on samples greatly.  No chest pain , orthopnea, edema or fever.  rec  No change rx    11/22/2013 acute ov/Wert re: asthma exac  Chief Complaint  Patient presents with  . Acute Visit    Pt c/o prod cough with large amounts of green sputum x 3 days- he also reports having minimal increase wheezing and SOB.   cough so hard vomited earlier today. Still not using much saba. Onset of symptoms was abrupt with mild sore throat.  No ha, rigors, arthralgias rec zpak Prednisone 10 mg take  4 each am x 2 days,   2 each am x 2 days,  1 each am x 2 days and stop For nausea or severe cough/ gagging > Phenergan vc with codeine 2 tsp every 4 hours  See calendar cxr w/ bronchitic changes.    12/07/2013 f/u ov/Wert re: severe chronic asthma  Chief Complaint  Patient presents with  . Follow-up    Pt states breathing much better, back at baseline. He states has minimal wheezing and  cough. Cough is prod with minimal clear sputum.    No need at all for prns No increase itching sneezing off allergy shots since 07/2013 Denies problems filling rx and following med calendar >>no changes  01/18/2014 Follow up  Returns  6 week follow up Asthma - pt reports is still wheezing despite therapy, cough occasionally producing clear mucus, a little increased SOB. No discolored mucus , fever, chest pain, orthopnea or edema.     Current Medications, Allergies, Complete Past Medical History, Past Surgical History, Family History, and Social History  were reviewed in [employer] record.  ROS  The following are not active complaints unless bolded sore throat, dysphagia, dental problems, itching, sneezing,  nasal congestion or  excess/ purulent secretions, ear ache,   fever, chills, sweats, unintended wt loss, pleuritic or exertional cp, hemoptysis,  orthopnea pnd or leg swelling, presyncope, palpitations, heartburn, abdominal pain, anorexia, nausea, vomiting, diarrhea  or change in bowel or urinary habits, change in stools or urine, dysuria,hematuria,  rash, arthralgias, visual complaints, headache, numbness weakness or ataxia or problems with walking or coordination,  change in mood/affect or memory.         Past Medical Hx ASTHMA (ICD-493.90)  --chronic severe asthma with best FEV1 2.2 L documented 01/2001 and  documented non-adherence Mainly lives off samples of inhalers and nose sprays  - HFA 75% June 15, 2009 > 90% August 02, 2009 > 100% December 27, 2009  - Off allergy vaccines x7/2010 > highly allergic skin tests per Dr Annamaria Boots > restarted vaccine November 06, 2009 > stopped  07/2013  SHOULDER PAIN, RIGHT (ICD-719.41)   HYPERTENSION, BORDERLINE (ICD-401.9)  Shingles T 8 Left 9/01  HEALTH MAINTENANCE...............................................................................................Marland KitchenWert  - Colonoscopy 12/1989 , 2011  - dT 03/2005  - Pneumovax 3/04 and 05/2009  -Flu declined October 17, 2008 and September 21, 2009  -CPX 04/30/2013  - Meds reviewed with pt education and computerized med calendar adjusted October 19, 2009, February 20, 2010, September 10, 2010 , 05/22/2012 , 03/01/2013, 10/26/2013            Objective:   Physical Exam  amb bm nad     HEENT: nl dentition, turbinates, and orophanx. Nl external ear canals without cough reflex, poor dentition    NECK :  without JVD/Nodes/TM/ nl carotid upstrokes bilaterally   LUNGS: no acc muscle use, hyperresonant to percussion,   Few exp wheezes   CV:  RRR  no s3 or murmur or increase in P2, no edema   ABD:  soft and nontender with nl excursion in the supine position. No bruits or organomegaly, bowel sounds nl  MS:  Nl gait, warm without  deformities, nl gait, no calf tenderness, cyanosis or clubbing      CXR  11/22/2013 : Increased peribronchial thickening with minimal atelectasis at the left lung base. Findings are consistent with bronchitis       Assessment & Plan:

## 2014-01-20 NOTE — Assessment & Plan Note (Signed)
Recurrent flare  Steroid discussion w/ pt education   Plan  Prednisone taper over next week.  Continue on current regimen   Please contact office for sooner follow up if symptoms do not improve or worsen or seek emergency care  follow up Dr. Melvyn Novas  In 6 weeks and As needed

## 2014-02-01 ENCOUNTER — Telehealth: Payer: Self-pay | Admitting: Gastroenterology

## 2014-02-01 MED ORDER — OMEPRAZOLE 40 MG PO CPDR: 40.0000 mg | DELAYED_RELEASE_CAPSULE | Freq: Every day | ORAL | Status: DC

## 2014-02-01 NOTE — Telephone Encounter (Signed)
Spoke with patient and per patient RX copay for lansoprazole is $50, cannot afford it. I advised I can send Omeprazole that's normally the lease cost RX for PPI's but cannot be 100 % sure until it is ran through his insurance company. Patient verbalized understanding. Advised patient to call me back if issues of cost at pharmacy.

## 2014-03-01 ENCOUNTER — Ambulatory Visit (INDEPENDENT_AMBULATORY_CARE_PROVIDER_SITE_OTHER): Payer: Medicare Other | Admitting: Internal Medicine

## 2014-03-01 ENCOUNTER — Encounter: Payer: Self-pay | Admitting: Internal Medicine

## 2014-03-01 VITALS — BP 146/82 | HR 79 | Temp 97.3°F | Ht 67.5 in | Wt 205.0 lb

## 2014-03-01 DIAGNOSIS — J45909 Unspecified asthma, uncomplicated: Secondary | ICD-10-CM | POA: Diagnosis not present

## 2014-03-01 MED ORDER — ALBUTEROL SULFATE HFA 108 (90 BASE) MCG/ACT IN AERS: 2.0000 | INHALATION_SPRAY | RESPIRATORY_TRACT | Status: DC | PRN

## 2014-03-01 MED ORDER — PREDNISONE 10 MG PO TABS: ORAL_TABLET | ORAL | Status: DC

## 2014-03-01 MED ORDER — MOMETASONE FURO-FORMOTEROL FUM 200-5 MCG/ACT IN AERO: 2.0000 | INHALATION_SPRAY | Freq: Two times a day (BID) | RESPIRATORY_TRACT | Status: DC

## 2014-03-01 NOTE — Progress Notes (Signed)
Subjective:    Patient ID: [name]    DOB: 05-12-1941   Brief patient profile:  87 yobm denies ever smoking but has lifelong asthma with severe chronic pattern and best FEV1 of around 2.2 liters documented 2002 followed in pulmonary for primary care also for hbp and hyperlipidemia.  HPI  04/24/2012 f/u ov/ wert, doing fine but lost calendar and has two empty inhalers with saba and one dulera with count 118. No purulent sputum. Denies needing saba daytime. No overt hb or sinus complaints. >>no changes / discussed adherence   07/30/13 Allergy eval rx complete x 4 years > d/c and f/u allergy prn   08/13/2013 f/u ov/Wert re asthma/ hbp Chief Complaint  Patient presents with  . Asthma    Breathing is doing well. Reports coughing from time to time. Denies SOB, chest tightness or wheezing at this time.   rare need for saba hfa, even less for neb (maybe once a month) >no changes   09/16/2013 Acute OV  Pt complains of prod cough with white mucus x1 week - denies SOB,  tightness, hemoptysis, n/v, edema Complains over the last week. The cough and congestion have worsened. Now. He has very thick mucus, nasal drainage, and intermittent wheezing. Patient denies any hemoptysis, orthopnea, PND, or leg swelling.  Has had to use his rescue inhaler more often than usual. Patient has been using Mucinex DM without much help. rec Omnicef Twice daily  for 7 days  Mucinex DM Twice daily  As needed  Cough/congestion  Prednisone taper over next week.    10/07/2013 f/u ov/Wert re:  Sob  Chief Complaint  Patient presents with  . Acute Visit    Pt states when he lays on his left side he feels SOB and like his airway is being cut off- onset was approx 3 wks ago. He has cough at night- prod with white sputum.    confused with meds, all symptoms better p prednisone, has ventolin with  Count 46 says not using neb at all  >>pred taper   10/26/2013 Follow up and Med review  Pt returns for follow up and med  review  Pt reviewed all her meds and organized them into a med calendar with pt education Pt with asthma flare last ov, tx with steroid taper .  Feeling better back to his baseline Decreased SABA use. Ventolin on 41 today.  CXR last ov with no acute changes  Depends on samples greatly.  No chest pain , orthopnea, edema or fever.  rec  No change rx    11/22/2013 acute ov/Wert re: asthma exac  Chief Complaint  Patient presents with  . Acute Visit    Pt c/o prod cough with large amounts of green sputum x 3 days- he also reports having minimal increase wheezing and SOB.   cough so hard vomited earlier today. Still not using much saba. Onset of symptoms was abrupt with mild sore throat.  No ha, rigors, arthralgias rec zpak Prednisone 10 mg take  4 each am x 2 days,   2 each am x 2 days,  1 each am x 2 days and stop For nausea or severe cough/ gagging > Phenergan vc with codeine 2 tsp every 4 hours  See calendar cxr w/ bronchitic changes.    12/07/2013 f/u ov/Wert re: severe chronic asthma  Chief Complaint  Patient presents with  . Follow-up    Pt states breathing much better, back at baseline. He states has minimal wheezing and  cough. Cough is prod with minimal clear sputum.    No need at all for prns No increase itching sneezing off allergy shots since 07/2013 Denies problems filling rx and following med calendar >>no changes  01/18/2014 Follow up  Returns  6 week follow up Asthma - pt reports is still wheezing despite therapy, cough occasionally producing clear mucus, a little increased SOB. rec pred x 6   03/01/2014 f/u ov/Wert re: asthma flare Chief Complaint  Patient presents with  . Follow-up    Pt c/o wheezing in the evening and early am for the past several days- relates to weather change. He is using nebulizer several times per wk.   saba hfa in pocket empty. Mostly has samples in med bag No purulent sputum.    No obvious day to day or daytime variabilty or assoc  chronic cough or cp or chest tightness, subjective wheeze overt sinus or hb symptoms. No unusual exp hx or h/o childhood pna/ asthma or knowledge of premature birth.  Sleeping ok without nocturnal  or early am exacerbation  of respiratory  c/o's or need for noct saba. Also denies any obvious fluctuation of symptoms with weather or environmental changes or other aggravating or alleviating factors except as outlined above   Current Medications, Allergies, Complete Past Medical History, Past Surgical History, Family History, and Social History were reviewed in [employer] record.  ROS  The following are not active complaints unless bolded sore throat, dysphagia, dental problems, itching, sneezing,  nasal congestion or excess/ purulent secretions, ear ache,   fever, chills, sweats, unintended wt loss, pleuritic or exertional cp, hemoptysis,  orthopnea pnd or leg swelling, presyncope, palpitations, heartburn, abdominal pain, anorexia, nausea, vomiting, diarrhea  or change in bowel or urinary habits, change in stools or urine, dysuria,hematuria,  rash, arthralgias, visual complaints, headache, numbness weakness or ataxia or problems with walking or coordination,  change in mood/affect or memory.         Past Medical Hx ASTHMA (ICD-493.90)  --chronic severe asthma with best FEV1 2.2 L documented 01/2001 and  documented non-adherence Mainly lives off samples of inhalers and nose sprays  - HFA 75% June 15, 2009 > 90% August 02, 2009 > 100% December 27, 2009  - Off allergy vaccines x7/2010 > highly allergic skin tests per Dr Annamaria Boots > restarted vaccine November 06, 2009 > stopped  07/2013  SHOULDER PAIN, RIGHT (ICD-719.41)   HYPERTENSION, BORDERLINE (ICD-401.9)  Shingles T 8 Left 9/01  HEALTH MAINTENANCE...............................................................................................Marland KitchenWert  - Colonoscopy 12/1989 , 2011  - dT 03/2005  - Pneumovax 3/04 and 05/2009  -Flu  declined October 17, 2008 and September 21, 2009  -CPX 04/30/2013  - Meds reviewed with pt education and computerized med calendar adjusted October 19, 2009, February 20, 2010, September 10, 2010 , 05/22/2012 , 03/01/2013, 10/26/2013            Objective:   Physical Exam  Wt Readings from Last 3 Encounters:  03/01/14 205 lb (92.987 kg)  01/18/14 204 lb (92.534 kg)  12/10/13 198 lb 4 oz (89.926 kg)      amb bm nad     HEENT: nl dentition, turbinates, and orophanx. Nl external ear canals without cough reflex, poor dentition    NECK :  without JVD/Nodes/TM/ nl carotid upstrokes bilaterally   LUNGS: no acc muscle use, hyperresonant to percussion,   Few exp wheezes   CV:  RRR  no s3 or murmur or increase in P2, no edema   ABD:  soft and nontender with nl excursion in the supine position. No bruits or organomegaly, bowel sounds nl  MS:  Nl gait, warm without deformities, nl gait, no calf tenderness, cyanosis or clubbing      CXR  11/22/2013 : Increased peribronchial thickening with minimal atelectasis at the left lung base. Findings are consistent with bronchitis       Assessment & Plan:

## 2014-03-01 NOTE — Patient Instructions (Addendum)
Work on Technical sales engineer, smooth and deep  Prednisone 10 mg take  4 each am x 2 days,   2 each am x 2 days,  1 each am x 2 days and stop   See calendar for specific medication instructions and bring it back for each and every office visit for every healthcare provider you see.  Without it,  you may not receive the best quality medical care that we feel you deserve.  You will note that the calendar groups together  your maintenance  medications that are timed at particular times of the day.  Think of this as your checklist for what your doctor has instructed you to do until your next evaluation to see what benefit  there is  to staying on a consistent group of medications intended to keep you well.  The other group at the bottom is entirely up to you to use as you see fit  for specific symptoms that may arise between visits that require you to treat them on an as needed basis.  Think of this as your action plan or "what if" list.   Separating the top medications from the bottom group is fundamental to providing you adequate care going forward.   Please schedule a follow up office visit in 4 weeks, sooner if needed with [name] and all your meds and med calendar

## 2014-03-01 NOTE — Assessment & Plan Note (Signed)
--  chronic severe asthma with best FEV1 2.2 L documented 01/2001 and documented non-adherence Mainly lives off samples of inhalers and nose sprays  - Off allergy vaccines x7/2010 > highly allergic skin tests per Dr Annamaria Boots > restart vaccine November 06, 2009>stop 07/30/13 to watch - Spirometry on "good day" 01/14/12 =  FEV1 1.54  Ratio 0.58    DDX of  difficult airways managment all start with A and  include Adherence, Ace Inhibitors, Acid Reflux, Active Sinus Disease, Alpha 1 Antitripsin deficiency, Anxiety masquerading as Airways dz,  ABPA,  allergy(esp in young), Aspiration (esp in elderly), Adverse effects of DPI,  Active smokers, plus two Bs  = Bronchiectasis and Beta blocker use..and one C= CHF  Adherence is always the initial "prime suspect" and is a multilayered concern that requires a "trust but verify" approach in every patient - starting with knowing how to use medications, especially inhalers, correctly, keeping up with refills and understanding the fundamental difference between maintenance and prns vs those medications only taken for a very short course and then stopped and not refilled.  - highly reliant on samples - hfa saba empty in pocket - The proper method of use, as well as anticipated side effects, of a metered-dose inhaler are discussed and demonstrated to the patient. Improved effectiveness after extensive coaching during this visit to a level of approximately  75% from baseline of < 25%  ? Acid (or non-acid) GERD > always difficult to exclude as up to 75% of pts in some series report no assoc GI/ Heartburn symptoms> rec continue  max (24h)  acid suppression and diet restrictions/ reviewed and instructions given in writing.   ? Allergy > Prednisone 10 mg take  4 each am x 2 days,   2 each am x 2 days,  1 each am x 2 days and stop '   Each maintenance medication was reviewed in detail including most importantly the difference between maintenance and as needed and under what  circumstances the prns are to be used. This was done in the context of a medication calendar review which provided the patient with a user-friendly unambiguous mechanism for medication administration and reconciliation and provides an action plan for all active problems. It is critical that this be shown to every doctor  for modification during the office visit if necessary so the patient can use it as a working document.      Marland Kitchen

## 2014-03-17 ENCOUNTER — Telehealth: Payer: Self-pay | Admitting: Internal Medicine

## 2014-03-17 MED ORDER — PREDNISONE 10 MG PO TABS: ORAL_TABLET | ORAL | Status: DC

## 2014-03-17 MED ORDER — AZITHROMYCIN 250 MG PO TABS: ORAL_TABLET | ORAL | Status: DC

## 2014-03-17 NOTE — Telephone Encounter (Signed)
Called spoke with pt. She c/o prod cough w/ yellow phlem, PND, nasal congestion x couple days. No wheezing, no chest tx, no increase SOB. He has been taking OTC cough syrup. Please advise MW thanks  No Known Allergies

## 2014-03-17 NOTE — Telephone Encounter (Signed)
zpak Prednisone 10 mg take  4 each am x 2 days,   2 each am x 2 days,  1 each am x 2 days and stop  

## 2014-03-17 NOTE — Telephone Encounter (Signed)
Pt aware of MW recs. rx's have been sent in. Nothing further needed

## 2014-03-23 DIAGNOSIS — H40029 Open angle with borderline findings, high risk, unspecified eye: Secondary | ICD-10-CM | POA: Diagnosis not present

## 2014-03-23 DIAGNOSIS — Z961 Presence of intraocular lens: Secondary | ICD-10-CM | POA: Diagnosis not present

## 2014-03-23 DIAGNOSIS — H524 Presbyopia: Secondary | ICD-10-CM | POA: Diagnosis not present

## 2014-03-23 DIAGNOSIS — H04129 Dry eye syndrome of unspecified lacrimal gland: Secondary | ICD-10-CM | POA: Diagnosis not present

## 2014-03-29 ENCOUNTER — Ambulatory Visit: Payer: Medicare Other | Admitting: Adult Health

## 2014-03-29 ENCOUNTER — Ambulatory Visit: Payer: Medicare Other | Admitting: Internal Medicine

## 2014-03-30 ENCOUNTER — Encounter: Payer: Self-pay | Admitting: Adult Health

## 2014-03-30 ENCOUNTER — Ambulatory Visit (INDEPENDENT_AMBULATORY_CARE_PROVIDER_SITE_OTHER): Payer: Medicare Other | Admitting: Adult Health

## 2014-03-30 VITALS — BP 110/68 | HR 86 | Temp 98.2°F | Ht 68.0 in | Wt 204.0 lb

## 2014-03-30 DIAGNOSIS — J45909 Unspecified asthma, uncomplicated: Secondary | ICD-10-CM | POA: Diagnosis not present

## 2014-03-30 NOTE — Patient Instructions (Addendum)
Follow up in 6 weeks with Dr. Melvyn Novas for physical   And As needed   Follow med calenadar closely and bring to each visit.  Bring Formulary to next office visit .

## 2014-03-30 NOTE — Assessment & Plan Note (Addendum)
Recent flare now resolved  Patient's medications were reviewed today and patient education was given. Computerized medication calendar was adjusted/completed   Plan  Follow up in 6 weeks with Dr. Melvyn Novas for physical   And As needed   Follow med calenadar closely and bring to each visit.  Bring Formulary to next office visit .

## 2014-03-30 NOTE — Progress Notes (Signed)
Subjective:    Patient ID: [name]    DOB: 05-12-1941   Brief patient profile:  87 yobm denies ever smoking but has lifelong asthma with severe chronic pattern and best FEV1 of around 2.2 liters documented 2002 followed in pulmonary for primary care also for hbp and hyperlipidemia.  HPI  04/24/2012 f/u ov/ wert, doing fine but lost calendar and has two empty inhalers with saba and one dulera with count 118. No purulent sputum. Denies needing saba daytime. No overt hb or sinus complaints. >>no changes / discussed adherence   07/30/13 Allergy eval rx complete x 4 years > d/c and f/u allergy prn   08/13/2013 f/u ov/Wert re asthma/ hbp Chief Complaint  Patient presents with  . Asthma    Breathing is doing well. Reports coughing from time to time. Denies SOB, chest tightness or wheezing at this time.   rare need for saba hfa, even less for neb (maybe once a month) >no changes   09/16/2013 Acute OV  Pt complains of prod cough with white mucus x1 week - denies SOB,  tightness, hemoptysis, n/v, edema Complains over the last week. The cough and congestion have worsened. Now. He has very thick mucus, nasal drainage, and intermittent wheezing. Patient denies any hemoptysis, orthopnea, PND, or leg swelling.  Has had to use his rescue inhaler more often than usual. Patient has been using Mucinex DM without much help. rec Omnicef Twice daily  for 7 days  Mucinex DM Twice daily  As needed  Cough/congestion  Prednisone taper over next week.    10/07/2013 f/u ov/Wert re:  Sob  Chief Complaint  Patient presents with  . Acute Visit    Pt states when he lays on his left side he feels SOB and like his airway is being cut off- onset was approx 3 wks ago. He has cough at night- prod with white sputum.    confused with meds, all symptoms better p prednisone, has ventolin with  Count 46 says not using neb at all  >>pred taper   10/26/2013 Follow up and Med review  Pt returns for follow up and med  review  Pt reviewed all her meds and organized them into a med calendar with pt education Pt with asthma flare last ov, tx with steroid taper .  Feeling better back to his baseline Decreased SABA use. Ventolin on 41 today.  CXR last ov with no acute changes  Depends on samples greatly.  No chest pain , orthopnea, edema or fever.  rec  No change rx    11/22/2013 acute ov/Wert re: asthma exac  Chief Complaint  Patient presents with  . Acute Visit    Pt c/o prod cough with large amounts of green sputum x 3 days- he also reports having minimal increase wheezing and SOB.   cough so hard vomited earlier today. Still not using much saba. Onset of symptoms was abrupt with mild sore throat.  No ha, rigors, arthralgias rec zpak Prednisone 10 mg take  4 each am x 2 days,   2 each am x 2 days,  1 each am x 2 days and stop For nausea or severe cough/ gagging > Phenergan vc with codeine 2 tsp every 4 hours  See calendar cxr w/ bronchitic changes.    12/07/2013 f/u ov/Wert re: severe chronic asthma  Chief Complaint  Patient presents with  . Follow-up    Pt states breathing much better, back at baseline. He states has minimal wheezing and  cough. Cough is prod with minimal clear sputum.    No need at all for prns No increase itching sneezing off allergy shots since 07/2013 Denies problems filling rx and following med calendar >>no changes  01/18/2014 Follow up  Returns  6 week follow up Asthma - pt reports is still wheezing despite therapy, cough occasionally producing clear mucus, a little increased SOB. rec pred x 6   03/01/2014 f/u ov/Wert re: asthma flare Chief Complaint  Patient presents with  . Follow-up    Pt c/o wheezing in the evening and early am for the past several days- relates to weather change. He is using nebulizer several times per wk.   saba hfa in pocket empty. Mostly has samples in med bag No purulent sputum. >>pred taper   03/30/2014 Follow up and Med review   Returns for follow up and med review .  We reviewed all his meds and organized them into a med calendar with pt education.  He depends greatly on samples. Dulera inhaler is empty today . Says he can not afford - requested to bring formulary to next ov to help with more cost effective choices if possible.  Breathing has improved since last OV.  Pollen is still causing issues. Reports coughing with production of light green mucus on off, clears after 1 coughing episode. No fever.  Denies chest tightness, SOB or wheezing. Was treated with steroid taper last ov , feels better near baseline, we discussed symptom treatment/trigger control     Current Medications, Allergies, Complete Past Medical History, Past Surgical History, Family History, and Social History were reviewed in [employer] record.  ROS  The following are not active complaints unless bolded sore throat, dysphagia, dental problems, itching, sneezing,  nasal congestion or excess/ purulent secretions, ear ache,   fever, chills, sweats, unintended wt loss, pleuritic or exertional cp, hemoptysis,  orthopnea pnd or leg swelling, presyncope, palpitations, heartburn, abdominal pain, anorexia, nausea, vomiting, diarrhea  or change in bowel or urinary habits, change in stools or urine, dysuria,hematuria,  rash, arthralgias, visual complaints, headache, numbness weakness or ataxia or problems with walking or coordination,  change in mood/affect or memory.         Past Medical Hx ASTHMA (ICD-493.90)  --chronic severe asthma with best FEV1 2.2 L documented 01/2001 and  documented non-adherence Mainly lives off samples of inhalers and nose sprays  - HFA 75% June 15, 2009 > 90% August 02, 2009 > 100% December 27, 2009  - Off allergy vaccines x7/2010 > highly allergic skin tests per Dr Annamaria Boots > restarted vaccine November 06, 2009 > stopped  07/2013  SHOULDER PAIN, RIGHT (ICD-719.41)   HYPERTENSION, BORDERLINE (ICD-401.9)   Shingles T 8 Left 9/01  HEALTH MAINTENANCE...............................................................................................Marland KitchenWert  - Colonoscopy 12/1989 , 2011  - dT 03/2005  - Pneumovax 3/04 and 05/2009  -Flu declined October 17, 2008 and September 21, 2009  -CPX 04/30/2013  - Meds reviewed with pt education and computerized med calendar adjusted October 19, 2009, February 20, 2010, September 10, 2010 , 05/22/2012 , 03/01/2013, 10/26/2013 , 03/30/2014            Objective:   Physical Exam    amb bm nad     HEENT: nl dentition, turbinates, and orophanx. Nl external ear canals without cough reflex, poor dentition    NECK :  without JVD/Nodes/TM/ nl carotid upstrokes bilaterally   LUNGS: no acc muscle use, hyperresonant to percussion,   Trace wheezing on forced exp .  CV:  RRR  no s3 or murmur or increase in P2, no edema   ABD:  soft and nontender with nl excursion in the supine position. No bruits or organomegaly, bowel sounds nl  MS:  Nl gait, warm without deformities, nl gait, no calf tenderness, cyanosis or clubbing      CXR  11/22/2013 : Increased peribronchial thickening with minimal atelectasis at the left lung base. Findings are consistent with bronchitis       Assessment & Plan:

## 2014-03-30 NOTE — Addendum Note (Signed)
Addended by: Parke Poisson E on: 03/30/2014 01:00 PM   Modules accepted: Orders

## 2014-03-31 DIAGNOSIS — D369 Benign neoplasm, unspecified site: Secondary | ICD-10-CM | POA: Diagnosis not present

## 2014-04-30 ENCOUNTER — Telehealth: Payer: Self-pay | Admitting: Internal Medicine

## 2014-04-30 MED ORDER — ALBUTEROL SULFATE (2.5 MG/3ML) 0.083% IN NEBU: 2.5000 mg | INHALATION_SOLUTION | RESPIRATORY_TRACT | Status: DC | PRN

## 2014-04-30 NOTE — Telephone Encounter (Signed)
Called again by patient. Rx not available. Will call Rx into [facility].

## 2014-04-30 NOTE — Telephone Encounter (Signed)
Received call, Rx not transmitted based on last call. Retransmitted.

## 2014-04-30 NOTE — Telephone Encounter (Signed)
Patient called for refill of nebulized albuterol. Script written.

## 2014-05-04 ENCOUNTER — Ambulatory Visit (INDEPENDENT_AMBULATORY_CARE_PROVIDER_SITE_OTHER): Payer: Medicare Other | Admitting: Internal Medicine

## 2014-05-04 ENCOUNTER — Encounter: Payer: Self-pay | Admitting: Internal Medicine

## 2014-05-04 VITALS — BP 144/84 | HR 77 | Temp 97.8°F | Ht 68.0 in | Wt 202.0 lb

## 2014-05-04 DIAGNOSIS — J45909 Unspecified asthma, uncomplicated: Secondary | ICD-10-CM | POA: Diagnosis not present

## 2014-05-04 MED ORDER — PREDNISONE 10 MG PO TABS: ORAL_TABLET | ORAL | Status: DC

## 2014-05-04 NOTE — Patient Instructions (Addendum)
Prednisone 10 mg take  4 each am x 2 days,   2 each am x 2 days,  1 each am x 2 days and stop   See calendar for specific medication instructions and bring it back for each and every office visit for every healthcare provider you see.  Without it,  you may not receive the best quality medical care that we feel you deserve.  You will note that the calendar groups together  your maintenance  medications that are timed at particular times of the day.  Think of this as your checklist for what your doctor has instructed you to do until your next evaluation to see what benefit  there is  to staying on a consistent group of medications intended to keep you well.  The other group at the bottom is entirely up to you to use as you see fit  for specific symptoms that may arise between visits that require you to treat them on an as needed basis.  Think of this as your action plan or "what if" list.   Separating the top medications from the bottom group is fundamental to providing you adequate care going forward.    Bring all meds back in two weeks in two bags organized as above for CPX on return Add has dulera #45 count and no more at home so sample #60 puffs x one given

## 2014-05-04 NOTE — Progress Notes (Signed)
Subjective:    Patient ID: [name]    DOB: 05-12-1941   Brief patient profile:  87 yobm denies ever smoking but has lifelong asthma with severe chronic pattern and best FEV1 of around 2.2 liters documented 2002 followed in pulmonary for primary care also for hbp and hyperlipidemia.  HPI  04/24/2012 f/u ov/ [name], doing fine but lost calendar and has two empty inhalers with saba and one dulera with count 118. No purulent sputum. Denies needing saba daytime. No overt hb or sinus complaints. >>no changes / discussed adherence   07/30/13 Allergy eval rx complete x 4 years > d/c and f/u allergy prn   08/13/2013 f/u ov/[name] re asthma/ hbp Chief Complaint  Patient presents with  . Asthma    Breathing is doing well. Reports coughing from time to time. Denies SOB, chest tightness or wheezing at this time.   rare need for saba hfa, even less for neb (maybe once a month) >no changes   09/16/2013 Acute OV  Pt complains of prod cough with white mucus x1 week - denies SOB,  tightness, hemoptysis, n/v, edema Complains over the last week. The cough and congestion have worsened. Now. He has very thick mucus, nasal drainage, and intermittent wheezing. Patient denies any hemoptysis, orthopnea, PND, or leg swelling.  Has had to use his rescue inhaler more often than usual. Patient has been using Mucinex DM without much help. rec Omnicef Twice daily  for 7 days  Mucinex DM Twice daily  As needed  Cough/congestion  Prednisone taper over next week.    10/07/2013 f/u ov/Vivianne Jacobson re:  Sob  Chief Complaint  Patient presents with  . Acute Visit    Pt states when he lays on his left side he feels SOB and like his airway is being cut off- onset was approx 3 wks ago. He has cough at night- prod with white sputum.    confused with meds, all symptoms better p prednisone, has ventolin with  Count 46 says not using neb at all  >>pred taper   10/26/2013 Follow up and Med review  Pt returns for follow up and med  review  Pt reviewed all her meds and organized them into a med calendar with pt education Pt with asthma flare last ov, tx with steroid taper .  Feeling better back to his baseline Decreased SABA use. Ventolin on 41 today.  CXR last ov with no acute changes  Depends on samples greatly.  No chest pain , orthopnea, edema or fever.  rec  No change rx    11/22/2013 acute ov/[name] re: asthma exac  Chief Complaint  Patient presents with  . Acute Visit    Pt c/o prod cough with large amounts of green sputum x 3 days- he also reports having minimal increase wheezing and SOB.   cough so hard vomited earlier today. Still not using much saba. Onset of symptoms was abrupt with mild sore throat.  No ha, rigors, arthralgias rec zpak Prednisone 10 mg take  4 each am x 2 days,   2 each am x 2 days,  1 each am x 2 days and stop For nausea or severe cough/ gagging > Phenergan vc with codeine 2 tsp every 4 hours  See calendar cxr w/ bronchitic changes.    12/07/2013 f/u ov/[name] re: severe chronic asthma  Chief Complaint  Patient presents with  . Follow-up    Pt states breathing much better, back at baseline. He states has minimal wheezing and  cough. Cough is prod with minimal clear sputum.    No need at all for prns No increase itching sneezing off allergy shots since 07/2013 Denies problems filling rx and following med calendar >>no changes  01/18/2014 Follow up  Returns  6 week follow up Asthma - pt reports is still wheezing despite therapy, cough occasionally producing clear mucus, a little increased SOB. rec pred x 6   03/01/2014 f/u ov/[name] re: asthma flare Chief Complaint  Patient presents with  . Follow-up    Pt c/o wheezing in the evening and early am for the past several days- relates to weather change. He is using nebulizer several times per wk.   saba hfa in pocket empty. Mostly has samples in med bag No purulent sputum. >>pred taper   03/30/2014 Follow up and Med review   Returns for follow up and med review .  We reviewed all his meds and organized them into a med calendar with pt education.  He depends greatly on samples. Dulera inhaler is empty today . Says he can not afford - requested to bring formulary to next ov to help with more cost effective choices if possible.  Breathing has improved since last OV.  Pollen is still causing issues. Reports coughing with production of light green mucus on off, clears after 1 coughing episode. No fever.    Was treated with steroid taper last ov , feels better near baseline, we discussed symptom treatment/trigger control rec Follow up in 6 weeks with Dr. Melvyn Novas for physical   And As needed   Follow med calenadar closely and bring to each visit.  Bring Formulary to next office visit .    05/04/2014 f/u ov/[name] Bring re: chronic asthma/ ? Adherence (dulera 200 sample still has 45 left p 4 weeks sample 4/8) Chief Complaint  Patient presents with  . Follow-up    c.o sometimes prod cough with clear mucous, no other complaints at this time.      Very inactive but Not limited by breathing from desired activities   Denies need for saba in any form Cough is mostly in am but does not disturb sleep  No obvious day to day or daytime variabilty or assoc  cp or chest tightness, subjective wheeze overt sinus or hb symptoms. No unusual exp hx or h/o childhood pna/   knowledge of premature birth.  Sleeping ok without nocturnal  or early am exacerbation  of respiratory  c/o's or need for noct saba. Also denies any obvious fluctuation of symptoms with weather or environmental changes or other aggravating or alleviating factors except as outlined above   Current Medications, Allergies, Complete Past Medical History, Past Surgical History, Family History, and Social History were reviewed in [employer] record.  ROS  The following are not active complaints unless bolded sore throat, dysphagia, dental problems, itching,  sneezing,  nasal congestion or excess/ purulent secretions, ear ache,   fever, chills, sweats, unintended wt loss, pleuritic or exertional cp, hemoptysis,  orthopnea pnd or leg swelling, presyncope, palpitations, heartburn, abdominal pain, anorexia, nausea, vomiting, diarrhea  or change in bowel or urinary habits, change in stools or urine, dysuria,hematuria,  rash, arthralgias, visual complaints, headache, numbness weakness or ataxia or problems with walking or coordination,  change in mood/affect or memory.        Past Medical Hx ASTHMA (ICD-493.90)  --chronic severe asthma with best FEV1 2.2 L documented 01/2001 and  documented non-adherence Mainly lives off samples of inhalers and nose sprays  -  HFA 75% June 15, 2009 > 90% August 02, 2009 > 100% December 27, 2009  - Off allergy vaccines x7/2010 > highly allergic skin tests per Dr Annamaria Boots > restarted vaccine November 06, 2009 > stopped  07/2013  SHOULDER PAIN, RIGHT (ICD-719.41)   HYPERTENSION, BORDERLINE (ICD-401.9)  Shingles T 8 Left 9/01  HEALTH MAINTENANCE...............................................................................................Marland KitchenWert  - Colonoscopy 12/1989 , 2011  - dT 03/2005  - Pneumovax 3/04 and 05/2009  -Flu declined October 17, 2008 and September 21, 2009  -CPX 04/30/2013  - Meds reviewed with pt education and computerized med calendar adjusted October 19, 2009, February 20, 2010, September 10, 2010 , 05/22/2012 , 03/01/2013, 10/26/2013 , 03/30/2014            Objective:   Physical Exam    amb bm nad     Wt Readings from Last 3 Encounters:  05/04/14 202 lb (91.627 kg)  03/30/14 204 lb (92.534 kg)  03/01/14 205 lb (92.987 kg)      HEENT: nl dentition, turbinates, and orophanx. Nl external ear canals without cough reflex, poor dentition    NECK :  without JVD/Nodes/TM/ nl carotid upstrokes bilaterally   LUNGS: no acc muscle use, hyperresonant to percussion,   Trace wheezing on forced exp, mild increase exp  time  CV:  RRR  no s3 or murmur or increase in P2, no edema   ABD:  soft and nontender with nl excursion in the supine position. No bruits or organomegaly, bowel sounds nl  MS:  Nl gait, warm without deformities, nl gait, no calf tenderness, cyanosis or clubbing      CXR  11/22/2013 : Increased peribronchial thickening with minimal atelectasis at the left lung base. Findings are consistent with bronchitis       Assessment & Plan:

## 2014-05-06 NOTE — Assessment & Plan Note (Signed)
--  chronic severe asthma with best FEV1 2.2 L documented 01/2001 and documented non-adherence Mainly lives off samples of inhalers and nose sprays  - Off allergy vaccines x7/2010 > highly allergic skin tests per Dr Annamaria Boots > restart vaccine November 06, 2009>stop 07/30/13 to watch - Spirometry on "good day" 01/14/12 =  FEV1 1.54  Ratio 0.58  The proper method of use, as well as anticipated side effects, of a metered-dose inhaler are discussed and demonstrated to the patient. Improved effectiveness after extensive coaching during this visit to a level of approximately  90% from baseline of 75%  I had an extended discussion with the patient today lasting 15 to 20 minutes of a 25 minute visit on the following issues:    Each maintenance medication was reviewed in detail including most importantly the difference between maintenance and as needed and under what circumstances the prns are to be used. This was done in the context of a medication calendar review which provided the patient with a user-friendly unambiguous mechanism for medication administration and reconciliation and provides an action plan for all active problems. It is critical that this be shown to every doctor  for modification during the office visit if necessary so the patient can use it as a working document.      His numbers don't add up > given one more sample and f/u in 2 weeks planned  Prednisone 10 mg take  4 each am x 2 days,   2 each am x 2 days,  1 each am x 2 days and stop

## 2014-05-11 ENCOUNTER — Ambulatory Visit: Payer: Medicare Other | Admitting: Internal Medicine

## 2014-05-19 ENCOUNTER — Ambulatory Visit: Payer: Medicare Other | Admitting: Internal Medicine

## 2014-05-23 ENCOUNTER — Encounter: Payer: Self-pay | Admitting: Internal Medicine

## 2014-05-23 ENCOUNTER — Other Ambulatory Visit: Payer: Self-pay | Admitting: Internal Medicine

## 2014-05-23 ENCOUNTER — Ambulatory Visit (INDEPENDENT_AMBULATORY_CARE_PROVIDER_SITE_OTHER): Payer: Medicare Other | Admitting: Internal Medicine

## 2014-05-23 VITALS — BP 134/84 | HR 76 | Temp 97.9°F | Ht 68.0 in | Wt 201.0 lb

## 2014-05-23 DIAGNOSIS — J45909 Unspecified asthma, uncomplicated: Secondary | ICD-10-CM | POA: Diagnosis not present

## 2014-05-23 NOTE — Patient Instructions (Signed)
dulera 200 Take 2 puffs first thing in am and then another 2 puffs about 12 hours later - work on smooth deep breath  Prednisone 10 mg take  4 each am x 2 days,   2 each am x 2 days,  1 each am x 2 days and stop   See calendar for specific medication instructions and bring it back for each and every office visit for every healthcare provider you see.  Without it,  you may not receive the best quality medical care that we feel you deserve.  See [name] in 4  weeks with all your medications, even over the counter meds, separated in two separate bags, the ones you take no matter what vs the ones you stop once you feel better and take only as needed when you feel you need them.   Tammy  will generate for you a new user friendly medication calendar that will put [place] all on the same page re: your medication use.     Without this process, it simply isn't possible to assure that we are providing  your outpatient care  with  the attention to detail we feel you deserve.   If we cannot assure that you're getting that kind of care,  then we cannot manage your problem effectively from this clinic.  Once you have seen Tammy and we are sure that we're all on the same page with your medication use she will arrange follow up with me.      Marland Kitchen

## 2014-05-23 NOTE — Progress Notes (Signed)
Subjective:    Patient ID: [name]    DOB: 03-Jul-1941     Brief patient profile: 19 yobm denies ever smoking but has lifelong asthma with severe chronic pattern and best FEV1 of around 2.2 liters documented 2002 followed in pulmonary for primary care also for hbp and hyperlipidemia.  HPI  04/24/2012 f/u ov/ [name], doing fine but lost calendar and has two empty inhalers with saba and one dulera with count 118. No purulent sputum. Denies needing saba daytime. No overt hb or sinus complaints. >>no changes / discussed adherence   07/30/13 Allergy eval rx complete x 4 years > d/c and f/u allergy prn   08/13/2013 f/u ov/[name] re asthma/ hbp Chief Complaint  Patient presents with  . Asthma    Breathing is doing well. Reports coughing from time to time. Denies SOB, chest tightness or wheezing at this time.   rare need for saba hfa, even less for neb (maybe once a month) >no changes   09/16/2013 Acute OV  Pt complains of prod cough with white mucus x1 week - denies SOB,  tightness, hemoptysis, n/v, edema Complains over the last week. The cough and congestion have worsened. Now. He has very thick mucus, nasal drainage, and intermittent wheezing. Patient denies any hemoptysis, orthopnea, PND, or leg swelling.  Has had to use his rescue inhaler more often than usual. Patient has been using Mucinex DM without much help. rec Omnicef Twice daily  for 7 days  Mucinex DM Twice daily  As needed  Cough/congestion  Prednisone taper over next week.    10/07/2013 f/u ov/[name] re:  Sob  Chief Complaint  Patient presents with  . Acute Visit    Pt states when he lays on his left side he feels SOB and like his airway is being cut off- onset was approx 3 wks ago. He has cough at night- prod with white sputum.    confused with meds, all symptoms better p prednisone, has ventolin with  Count 46 says not using neb at all  >>pred taper      11/22/2013 acute ov/[name] re: asthma exac  Chief Complaint   Patient presents with  . Acute Visit    Pt c/o prod cough with large amounts of green sputum x 3 days- he also reports having minimal increase wheezing and SOB.   cough so hard vomited earlier today. Still not using much saba. Onset of symptoms was abrupt with mild sore throat.  No ha, rigors, arthralgias rec zpak Prednisone 10 mg take  4 each am x 2 days,   2 each am x 2 days,  1 each am x 2 days and stop For nausea or severe cough/ gagging > Phenergan vc with codeine 2 tsp every 4 hours  See calendar cxr w/ bronchitic changes.    03/01/2014 f/u ov/[name] re: asthma flare Chief Complaint  Patient presents with  . Follow-up    Pt c/o wheezing in the evening and early am for the past several days- relates to weather change. He is using nebulizer several times per wk.   saba hfa in pocket empty. Mostly has samples in med bag No purulent sputum. >>pred taper       05/04/2014 f/u ov/[name] re: chronic asthma/ ? Adherence (dulera 200 sample still has 45 left p 4 weeks sample given 4/8) Chief Complaint  Patient presents with  . Follow-up    c.o sometimes prod cough with clear mucous, no other complaints at this time.   Very  inactive but Not limited by breathing from desired activities   Denies need for saba in any form Cough is mostly in am but does not disturb sleep rec Prednisone 10 mg take  4 each am x 2 days,   2 each am x 2 days,  1 each am x 2 days and stop  See calendar for specific medication instructions and bring it back for each and every office visit for every healthcare provider you see.  Without it,  you may not receive the best quality medical care that we feel you deserve. Bring all meds back in two weeks in two bags organized as above for CPX on return Add has dulera #45 count and no more at home so sample #60 puffs x one given    05/23/2014 acute  ov/[name] re:  Asthma flare/ has full sample plus 5 left on next to last  (20 days x 4 per day so should have used 80 puffs but  only used 40 puffs)   Chief Complaint  Patient presents with  . Follow-up    Pt states had some wheezing this am. He states cough is some better since last visit, but still producing some clear sputum.  He is using rescue inhaler once per wk on average. Has used neb x 1 since last visit.     No obvious day to day or daytime variabilty or assoc  cp or chest tightness,   overt sinus or hb symptoms. No unusual exp hx or h/o childhood pna/   knowledge of premature birth.  Sleeping ok without nocturnal  or early am exacerbation  of respiratory  c/o's or need for noct saba. Also denies any obvious fluctuation of symptoms with weather or environmental changes or other aggravating or alleviating factors except as outlined above   Current Medications, Allergies, Complete Past Medical History, Past Surgical History, Family History, and Social History were reviewed in [employer] record.  ROS  The following are not active complaints unless bolded sore throat, dysphagia, dental problems, itching, sneezing,  nasal congestion or excess/ purulent secretions, ear ache,   fever, chills, sweats, unintended wt loss, pleuritic or exertional cp, hemoptysis,  orthopnea pnd or leg swelling, presyncope, palpitations, heartburn, abdominal pain, anorexia, nausea, vomiting, diarrhea  or change in bowel or urinary habits, change in stools or urine, dysuria,hematuria,  rash, arthralgias, visual complaints, headache, numbness weakness or ataxia or problems with walking or coordination,  change in mood/affect or memory.        Past Medical Hx ASTHMA (ICD-493.90)  --chronic severe asthma with best FEV1 2.2 L documented 01/2001 and  documented non-adherence Mainly lives off samples of inhalers and nose sprays  - HFA 75% June 15, 2009 > 90% August 02, 2009 > 100% December 27, 2009  - Off allergy vaccines x7/2010 > highly allergic skin tests per Dr Annamaria Boots > restarted vaccine November 06, 2009 > stopped   07/2013  SHOULDER PAIN, RIGHT (ICD-719.41)   HYPERTENSION, BORDERLINE (ICD-401.9)  Shingles T 8 Left 9/01  HEALTH MAINTENANCE...............................................................................................Marland KitchenWert  - Colonoscopy 12/1989 , 2011  - dT 03/2005  - Pneumovax 3/04 and 05/2009  -Flu declined October 17, 2008 and September 21, 2009  -CPX 04/30/2013  - Meds reviewed with pt education and computerized med calendar adjusted October 19, 2009, February 20, 2010, September 10, 2010 , 05/22/2012 , 03/01/2013, 10/26/2013 , 03/30/2014            Objective:   Physical Exam    amb bm  nad  Mild increased wob    05/23/2014         201  Wt Readings from Last 3 Encounters:  05/04/14 202 lb (91.627 kg)  03/30/14 204 lb (92.534 kg)  03/01/14 205 lb (92.987 kg)      HEENT: nl dentition, turbinates, and orophanx. Nl external ear canals without cough reflex, poor dentition    NECK :  without JVD/Nodes/TM/ nl carotid upstrokes bilaterally   LUNGS: no acc muscle use, hyperresonant to percussion,   Mid bilateral exp  wheezing on forced exp, mild increase exp time  CV:  RRR  no s3 or murmur or increase in P2, no edema   ABD:  soft and nontender with nl excursion in the supine position. No bruits or organomegaly, bowel sounds nl  MS:  Nl gait, warm without deformities, nl gait, no calf tenderness, cyanosis or clubbing      CXR  11/22/2013 : Increased peribronchial thickening with minimal atelectasis at the left lung base. Findings are consistent with bronchitis       Assessment & Plan:

## 2014-05-24 ENCOUNTER — Encounter: Payer: Self-pay | Admitting: Internal Medicine

## 2014-05-24 NOTE — Telephone Encounter (Signed)
Pt says that the pharm says that they didn't receive prescript refill it was to go to [facility] on [address].[name]

## 2014-05-24 NOTE — Assessment & Plan Note (Signed)
--  chronic severe asthma with best FEV1 2.2 L documented 01/2001 and documented non-adherence Mainly lives off samples of inhalers and nose sprays  - Off allergy vaccines x7/2010 > highly allergic skin tests per Dr Annamaria Boots > restart vaccine November 06, 2009>stop 07/30/13 to watch - Spirometry on "good day" 01/14/12 =  FEV1 1.54  Ratio 0.58  DDX of  difficult airways managment all start with A and  include Adherence, Ace Inhibitors, Acid Reflux, Active Sinus Disease, Alpha 1 Antitripsin deficiency, Anxiety masquerading as Airways dz,  ABPA,  allergy(esp in young), Aspiration (esp in elderly), Adverse effects of DPI,  Active smokers, plus two Bs  = Bronchiectasis and Beta blocker use..and one C= CHF  Adherence is always the initial "prime suspect" and is a multilayered concern that requires a "trust but verify" approach in every patient - starting with knowing how to use medications, especially inhalers, correctly, keeping up with refills and understanding the fundamental difference between maintenance and prns vs those medications only taken for a very short course and then stopped and not refilled.  - The proper method of use, as well as anticipated side effects, of a metered-dose inhaler are discussed and demonstrated to the patient. Improved effectiveness after extensive coaching during this visit to a level of approximately  90% from baseline of maybe 50% - counts on dulera samples do not add up - admits he may have missed one or two but missed about half of the doses per my count  ? Allergy > Prednisone 10 mg take  4 each am x 2 days,   2 each am x 2 days,  1 each am x 2 days and stop and may need to revisit restart allergy shots though note no prominent upper airway features so less likely   ? Acid (or non-acid) GERD > always difficult to exclude as up to 75% of pts in some series report no assoc GI/ Heartburn symptoms> rec continue max (24h)  acid suppression and diet restrictions/ reviewed and  instructions given in writing.     Each maintenance medication was reviewed in detail including most importantly the difference between maintenance and as needed and under what circumstances the prns are to be used. This was done in the context of a medication calendar review which provided the patient with a user-friendly unambiguous mechanism for medication administration and reconciliation and provides an action plan for all active problems. It is critical that this be shown to every doctor  for modification during the office visit if necessary so the patient can use it as a working document.

## 2014-06-20 ENCOUNTER — Encounter: Payer: Medicare Other | Admitting: Adult Health

## 2014-07-04 ENCOUNTER — Encounter: Payer: Self-pay | Admitting: Adult Health

## 2014-07-04 ENCOUNTER — Ambulatory Visit (INDEPENDENT_AMBULATORY_CARE_PROVIDER_SITE_OTHER): Payer: Medicare Other | Admitting: Adult Health

## 2014-07-04 VITALS — BP 110/66 | HR 74 | Temp 97.9°F | Ht 68.0 in | Wt 201.0 lb

## 2014-07-04 DIAGNOSIS — J45909 Unspecified asthma, uncomplicated: Secondary | ICD-10-CM

## 2014-07-04 DIAGNOSIS — J4540 Moderate persistent asthma, uncomplicated: Secondary | ICD-10-CM

## 2014-07-04 MED ORDER — ALBUTEROL SULFATE HFA 108 (90 BASE) MCG/ACT IN AERS: 2.0000 | INHALATION_SPRAY | RESPIRATORY_TRACT | Status: DC | PRN

## 2014-07-04 MED ORDER — MOMETASONE FURO-FORMOTEROL FUM 200-5 MCG/ACT IN AERO: 2.0000 | INHALATION_SPRAY | Freq: Two times a day (BID) | RESPIRATORY_TRACT | Status: DC

## 2014-07-04 MED ORDER — MOMETASONE FUROATE 50 MCG/ACT NA SUSP: 1.0000 | Freq: Two times a day (BID) | NASAL | Status: DC

## 2014-07-04 NOTE — Addendum Note (Signed)
Addended by: Parke Poisson E on: 07/04/2014 05:34 PM   Modules accepted: Orders

## 2014-07-04 NOTE — Assessment & Plan Note (Addendum)
Recent flare now resolved  Patient's medications were reviewed today and patient education was given. Computerized medication calendar was adjusted/completed    Plan  Follow up in 3  weeks with Dr. Melvyn Novas for physical   And As needed   Follow med calenadar closely and bring to each visit.  Bring Formulary to next office visit .

## 2014-07-04 NOTE — Progress Notes (Signed)
Subjective:    Patient ID: [name]    DOB: 03-Jul-1941     Brief patient profile: 19 yobm denies ever smoking but has lifelong asthma with severe chronic pattern and best FEV1 of around 2.2 liters documented 2002 followed in pulmonary for primary care also for hbp and hyperlipidemia.  HPI  04/24/2012 f/u ov/ wert, doing fine but lost calendar and has two empty inhalers with saba and one dulera with count 118. No purulent sputum. Denies needing saba daytime. No overt hb or sinus complaints. >>no changes / discussed adherence   07/30/13 Allergy eval rx complete x 4 years > d/c and f/u allergy prn   08/13/2013 f/u ov/Wert re asthma/ hbp Chief Complaint  Patient presents with  . Asthma    Breathing is doing well. Reports coughing from time to time. Denies SOB, chest tightness or wheezing at this time.   rare need for saba hfa, even less for neb (maybe once a month) >no changes   09/16/2013 Acute OV  Pt complains of prod cough with white mucus x1 week - denies SOB,  tightness, hemoptysis, n/v, edema Complains over the last week. The cough and congestion have worsened. Now. He has very thick mucus, nasal drainage, and intermittent wheezing. Patient denies any hemoptysis, orthopnea, PND, or leg swelling.  Has had to use his rescue inhaler more often than usual. Patient has been using Mucinex DM without much help. rec Omnicef Twice daily  for 7 days  Mucinex DM Twice daily  As needed  Cough/congestion  Prednisone taper over next week.    10/07/2013 f/u ov/Wert re:  Sob  Chief Complaint  Patient presents with  . Acute Visit    Pt states when he lays on his left side he feels SOB and like his airway is being cut off- onset was approx 3 wks ago. He has cough at night- prod with white sputum.    confused with meds, all symptoms better p prednisone, has ventolin with  Count 46 says not using neb at all  >>pred taper      11/22/2013 acute ov/Wert re: asthma exac  Chief Complaint   Patient presents with  . Acute Visit    Pt c/o prod cough with large amounts of green sputum x 3 days- he also reports having minimal increase wheezing and SOB.   cough so hard vomited earlier today. Still not using much saba. Onset of symptoms was abrupt with mild sore throat.  No ha, rigors, arthralgias rec zpak Prednisone 10 mg take  4 each am x 2 days,   2 each am x 2 days,  1 each am x 2 days and stop For nausea or severe cough/ gagging > Phenergan vc with codeine 2 tsp every 4 hours  See calendar cxr w/ bronchitic changes.    03/01/2014 f/u ov/Wert re: asthma flare Chief Complaint  Patient presents with  . Follow-up    Pt c/o wheezing in the evening and early am for the past several days- relates to weather change. He is using nebulizer several times per wk.   saba hfa in pocket empty. Mostly has samples in med bag No purulent sputum. >>pred taper       05/04/2014 f/u ov/Wert re: chronic asthma/ ? Adherence (dulera 200 sample still has 45 left p 4 weeks sample given 4/8) Chief Complaint  Patient presents with  . Follow-up    c.o sometimes prod cough with clear mucous, no other complaints at this time.   Very  inactive but Not limited by breathing from desired activities   Denies need for saba in any form Cough is mostly in am but does not disturb sleep rec Prednisone 10 mg take  4 each am x 2 days,   2 each am x 2 days,  1 each am x 2 days and stop  See calendar for specific medication instructions and bring it back for each and every office visit for every healthcare provider you see.  Without it,  you may not receive the best quality medical care that we feel you deserve. Bring all meds back in two weeks in two bags organized as above for CPX on return Add has dulera #45 count and no more at home so sample #60 puffs x one given    05/23/2014 acute  ov/Wert re:  Asthma flare/ has full sample plus 5 left on next to last  (20 days x 4 per day so should have used 80 puffs but  only used 40 puffs)   Chief Complaint  Patient presents with  . Follow-up    Pt states had some wheezing this am. He states cough is some better since last visit, but still producing some clear sputum.  He is using rescue inhaler once per wk on average. Has used neb x 1 since last visit.   >>pred taper     07/04/2014 Follow up and Med review  Returns for follow up and med review .  We reviewed all his meds and organized them into a med calendar with pt education.  He depends greatly on samples. Breathing has improved since last OV.     Denies chest tightness, SOB or fever .  Was treated with steroid taper last ov , feels better near baseline, we discussed symptom treatment/trigger control   Reports heat does cause some wheezing and dyspnea on /off.     Current Medications, Allergies, Complete Past Medical History, Past Surgical History, Family History, and Social History were reviewed in [facility] record.  ROS  The following are not active complaints unless bolded sore throat, dysphagia, dental problems, itching, sneezing,  nasal congestion or excess/ purulent secretions, ear ache,   fever, chills, sweats, unintended wt loss, pleuritic or exertional cp, hemoptysis,  orthopnea pnd or leg swelling, presyncope, palpitations, heartburn, abdominal pain, anorexia, nausea, vomiting, diarrhea  or change in bowel or urinary habits, change in stools or urine, dysuria,hematuria,  rash, arthralgias, visual complaints, headache, numbness weakness or ataxia or problems with walking or coordination,  change in mood/affect or memory.        Past Medical Hx ASTHMA (ICD-493.90)  --chronic severe asthma with best FEV1 2.2 L documented 01/2001 and  documented non-adherence Mainly lives off samples of inhalers and nose sprays  - HFA 75% June 15, 2009 > 90% August 02, 2009 > 100% December 27, 2009  - Off allergy vaccines x7/2010 > highly allergic skin tests per Dr Annamaria Boots > restarted  vaccine November 06, 2009 > stopped  07/2013  SHOULDER PAIN, RIGHT (ICD-719.41)   HYPERTENSION, BORDERLINE (ICD-401.9)  Shingles T 8 Left 9/01  HEALTH MAINTENANCE...............................................................................................Marland KitchenWert  - Colonoscopy 12/1989 , 2011  - dT 03/2005  - Pneumovax 3/04 and 05/2009  -Flu declined October 17, 2008 and September 21, 2009  -CPX 04/30/2013  - Meds reviewed with pt education and computerized med calendar adjusted October 19, 2009, February 20, 2010, September 10, 2010 , 05/22/2012 , 03/01/2013, 10/26/2013 , 03/30/2014 . 07/04/2014  Objective:   Physical Exam    amb bm nad     05/23/2014         201> 201 07/04/2014      HEENT: nl dentition, turbinates, and orophanx. Nl external ear canals without cough reflex, poor dentition    NECK :  without JVD/Nodes/TM/ nl carotid upstrokes bilaterally   LUNGS: no acc muscle use, hyperresonant to percussion,     CV:  RRR  no s3 or murmur or increase in P2, no edema   ABD:  soft and nontender with nl excursion in the supine position. No bruits or organomegaly, bowel sounds nl  MS:  Nl gait, warm without deformities, nl gait, no calf tenderness, cyanosis or clubbing      CXR  11/22/2013 : Increased peribronchial thickening with minimal atelectasis at the left lung base. Findings are consistent with bronchitis       Assessment & Plan:

## 2014-07-04 NOTE — Patient Instructions (Signed)
Follow up in 3  weeks with Dr. Melvyn Novas for physical   And As needed   Follow med calenadar closely and bring to each visit.  Bring Formulary to next office visit .

## 2014-07-25 ENCOUNTER — Ambulatory Visit (INDEPENDENT_AMBULATORY_CARE_PROVIDER_SITE_OTHER): Payer: Medicare Other | Admitting: Internal Medicine

## 2014-07-25 ENCOUNTER — Encounter: Payer: Self-pay | Admitting: Internal Medicine

## 2014-07-25 ENCOUNTER — Ambulatory Visit (INDEPENDENT_AMBULATORY_CARE_PROVIDER_SITE_OTHER)
Admission: RE | Admit: 2014-07-25 | Discharge: 2014-07-25 | Disposition: A | Discharge status: Home / Self Care | Payer: Medicare Other | Source: Ambulatory Visit | Attending: Internal Medicine | Admitting: Internal Medicine

## 2014-07-25 ENCOUNTER — Other Ambulatory Visit (INDEPENDENT_AMBULATORY_CARE_PROVIDER_SITE_OTHER): Payer: Medicare Other

## 2014-07-25 VITALS — BP 132/80 | HR 76 | Temp 97.3°F | Ht 68.0 in | Wt 196.0 lb

## 2014-07-25 DIAGNOSIS — E785 Hyperlipidemia, unspecified: Secondary | ICD-10-CM

## 2014-07-25 DIAGNOSIS — J45909 Unspecified asthma, uncomplicated: Secondary | ICD-10-CM | POA: Diagnosis not present

## 2014-07-25 DIAGNOSIS — J309 Allergic rhinitis, unspecified: Secondary | ICD-10-CM

## 2014-07-25 DIAGNOSIS — I10 Essential (primary) hypertension: Secondary | ICD-10-CM

## 2014-07-25 DIAGNOSIS — J9819 Other pulmonary collapse: Secondary | ICD-10-CM | POA: Diagnosis not present

## 2014-07-25 DIAGNOSIS — Z23 Encounter for immunization: Secondary | ICD-10-CM | POA: Diagnosis not present

## 2014-07-25 DIAGNOSIS — J3089 Other allergic rhinitis: Secondary | ICD-10-CM

## 2014-07-25 DIAGNOSIS — K219 Gastro-esophageal reflux disease without esophagitis: Secondary | ICD-10-CM

## 2014-07-25 DIAGNOSIS — Z0000 Encounter for general adult medical examination without abnormal findings: Secondary | ICD-10-CM

## 2014-07-25 DIAGNOSIS — J302 Other seasonal allergic rhinitis: Secondary | ICD-10-CM

## 2014-07-25 DIAGNOSIS — J4550 Severe persistent asthma, uncomplicated: Secondary | ICD-10-CM

## 2014-07-25 LAB — LIPID PANEL
CHOL/HDL RATIO: 5
Cholesterol: 189 mg/dL (ref 0–200)
HDL: 41.9 mg/dL (ref >39.00)
LDL CALC: 113 mg/dL — AB (ref 0–99)
NonHDL: 147.1
TRIGLYCERIDES: 169 mg/dL — AB (ref 0.0–149.0)
VLDL: 33.8 mg/dL (ref 0.0–40.0)

## 2014-07-25 LAB — HEPATIC FUNCTION PANEL
ALBUMIN: 3.8 g/dL (ref 3.5–5.2)
ALT: 17 U/L (ref 0–53)
AST: 22 U/L (ref 0–37)
Alkaline Phosphatase: 72 U/L (ref 39–117)
BILIRUBIN DIRECT: 0.1 mg/dL (ref 0.0–0.3)
TOTAL PROTEIN: 6.6 g/dL (ref 6.0–8.3)
Total Bilirubin: 0.7 mg/dL (ref 0.2–1.2)

## 2014-07-25 LAB — URINALYSIS
Bilirubin Urine: NEGATIVE
KETONES UR: NEGATIVE
LEUKOCYTES UA: NEGATIVE
Nitrite: NEGATIVE
SPECIFIC GRAVITY, URINE: 1.01 (ref 1.000–1.030)
Total Protein, Urine: NEGATIVE
URINE GLUCOSE: NEGATIVE
Urobilinogen, UA: 0.2 (ref 0.0–1.0)
pH: 6 (ref 5.0–8.0)

## 2014-07-25 LAB — BASIC METABOLIC PANEL
BUN: 13 mg/dL (ref 6–23)
CO2: 28 meq/L (ref 19–32)
Calcium: 9.3 mg/dL (ref 8.4–10.5)
Chloride: 106 mEq/L (ref 96–112)
Creatinine, Ser: 1.4 mg/dL (ref 0.4–1.5)
GFR: 62.13 mL/min (ref >60.00)
GLUCOSE: 83 mg/dL (ref 70–99)
POTASSIUM: 3.7 meq/L (ref 3.5–5.1)
SODIUM: 139 meq/L (ref 135–145)

## 2014-07-25 LAB — CBC WITH DIFFERENTIAL/PLATELET
BASOS PCT: 0.5 % (ref 0.0–3.0)
Basophils Absolute: 0 10*3/uL (ref 0.0–0.1)
EOS ABS: 1 10*3/uL — AB (ref 0.0–0.7)
EOS PCT: 11 % — AB (ref 0.0–5.0)
HCT: 42.5 % (ref 39.0–52.0)
Hemoglobin: 14.3 g/dL (ref 13.0–17.0)
LYMPHS PCT: 36.8 % (ref 12.0–46.0)
Lymphs Abs: 3.2 10*3/uL (ref 0.7–4.0)
MCHC: 33.5 g/dL (ref 30.0–36.0)
MCV: 86.7 fl (ref 78.0–100.0)
Monocytes Absolute: 0.7 10*3/uL (ref 0.1–1.0)
Monocytes Relative: 7.7 % (ref 3.0–12.0)
NEUTROS ABS: 3.9 10*3/uL (ref 1.4–7.7)
Neutrophils Relative %: 44 % (ref 43.0–77.0)
Platelets: 330 10*3/uL (ref 150.0–400.0)
RBC: 4.91 Mil/uL (ref 4.22–5.81)
RDW: 14 % (ref 11.5–15.5)
WBC: 8.8 10*3/uL (ref 4.0–10.5)

## 2014-07-25 LAB — TSH: TSH: 1.18 u[IU]/mL (ref 0.35–4.50)

## 2014-07-25 NOTE — Patient Instructions (Addendum)
Work on inhaler technique:  relax and gently blow all the way out then take a nice smooth deep breath back in, triggering the inhaler at same time you start breathing in.  Hold for up to 5 seconds if you can.  Rinse and gargle with water when done  Prevar, tetanus shots today  Please remember to go to the lab and x-ray department downstairs for your tests - we will call you with the results when they are available.    Please schedule a follow up office visit in 4 weeks, sooner if needed to see Tammy for new med calendar with all meds in hand  Late add : has exactly 30 days dulera samples and no more at home

## 2014-07-25 NOTE — Progress Notes (Signed)
Subjective:    Patient ID: [name]    DOB: 07/13/1941     Brief patient profile: 52 yobm denies ever smoking but has lifelong asthma with severe chronic pattern and best FEV1 of around 2.2 liters documented 2002 followed in pulmonary for primary care also for hbp and hyperlipidemia.   HPI  04/24/2012 f/u ov/ wert, doing fine but lost calendar and has two empty inhalers with saba and one dulera with count 118. No purulent sputum. Denies needing saba daytime. No overt hb or sinus complaints. >>no changes / discussed adherence   07/30/13 Allergy eval rx complete x 4 years > d/c and f/u allergy prn    07/25/2014 f/u ov/Wert re: severe chronic asthma/hbp/gerd  Chief Complaint  Patient presents with  . Annual Exam    Pt is fasting. He states still having some increased wheezing and dyspnea since last visit- relates to hot weather. Using rescue inhaler daily.  Has used neb x 1 since last visit.    No obvious day to day or daytime variabilty or assoc chronic cough or cp or chest tightness overt sinus or hb symptoms. No unusual exp hx or h/o childhood pna/ asthma or knowledge of premature birth.  Sleeping ok without nocturnal  or early am exacerbation  of respiratory  c/o's or need for noct saba. Also denies any obvious fluctuation of symptoms with weather or environmental changes or other aggravating or alleviating factors except as outlined above   Current Medications, Allergies, Complete Past Medical History, Past Surgical History, Family History, and Social History were reviewed in [employer] [employer] record.  ROS  The following are not active complaints unless bolded sore throat, dysphagia, dental problems, itching, sneezing,  nasal congestion or excess/ purulent secretions, ear ache,   fever, chills, sweats, unintended wt loss, pleuritic or exertional cp, hemoptysis,  orthopnea pnd or leg swelling, presyncope, palpitations, heartburn, abdominal pain, anorexia, nausea,  vomiting, diarrhea  or change in bowel or urinary habits, change in stools or urine, dysuria,hematuria,  rash, arthralgias, visual complaints, headache, numbness weakness or ataxia or problems with walking or coordination,  change in mood/affect or memory.              Past Medical Hx ASTHMA (ICD-493.90)  --chronic severe asthma with best FEV1 2.2 L documented 01/2001 and  documented non-adherence Mainly lives off samples of inhalers and nose sprays  - HFA 75% June 15, 2009 > 90% August 02, 2009 > 100% December 27, 2009  - Off allergy vaccines x7/2010 > highly allergic skin tests per Dr Annamaria Boots > restarted vaccine November 06, 2009 > stopped  07/2013  SHOULDER PAIN, RIGHT (ICD-719.41)   HYPERTENSION, BORDERLINE (ICD-401.9)  Shingles T 8 Left 9/01  HEALTH MAINTENANCE...............................................................................................Marland KitchenWert  - Colonoscopy 12/1989 , 2011  - dT 07/25/2014  - Pneumovax 3/04 and 05/2009 - Prevnar 07/25/14 -Flu declined October 17, 2008 and September 21, 2009  -CPX 07/25/2014  - Meds reviewed with pt education and computerized med calendar adjusted October 19, 2009, February 20, 2010, September 10, 2010 , 05/22/2012 , 03/01/2013, 10/26/2013 , 03/30/2014 . 07/04/2014            Objective:   Physical Exam    amb bm nad     05/23/2014         201> 201 07/04/2014 > 07/25/14  196     HEENT: nl dentition, turbinates, and orophanx. Nl external ear canals without cough reflex, poor dentition    NECK :  without JVD/Nodes/TM/ nl  carotid upstrokes bilaterally   LUNGS: no acc muscle use, hyperresonant to percussion,  Distant mid bilater exp wheeze   CV:  RRR  no s3 or murmur or increase in P2, no edema   ABD:  soft and nontender with nl excursion in the supine position. No bruits or organomegaly, bowel sounds nl  MS:  Nl gait, warm without deformities, nl gait, no calf tenderness, cyanosis or clubbing  GU testes down, no IH/ no nodules  Rectal :mod  bph/ smooth s nodules, stool g neg      CXR  11/22/2013 : Increased peribronchial thickening with minimal atelectasis at the left lung base. Findings are consistent with bronchitis       Assessment & Plan:

## 2014-07-27 NOTE — Assessment & Plan Note (Signed)
-   Colonoscopy 12/1989, Dr. Oletta Lamas ?2011  - dT 03/2005 - Pneumovax 3/04 and 05/2009 -Flu declined October 17, 2008 and September 21, 2009  - Meds reviewed with pt education and computerized med calendar adjusted October 19, 2009, February 20, 2010, September 10, 2010 , 04/04/2011 - Prevnar 07/25/14

## 2014-07-27 NOTE — Assessment & Plan Note (Signed)
--  chronic severe asthma with best FEV1 2.2 L documented 01/2001 and documented non-adherence Mainly lives off samples of inhalers and nose sprays  - Off allergy vaccines x7/2010 > highly allergic skin tests per Dr Annamaria Boots > restart vaccine November 06, 2009>stop 07/30/13 to watch - Spirometry on "good day" 01/14/12 =  FEV1 1.54  Ratio 0.58 - 05/23/14 p extensive coaching HFA effectiveness =    90% from baseline of 50%   Not Adequate control on present rx, reviewed > no change in rx needed for now but need to assure compliance Will give 4 week sample and see in 4 weeks

## 2014-07-27 NOTE — Assessment & Plan Note (Signed)
Adequate control on present rx, reviewed > no change in rx needed   

## 2014-07-27 NOTE — Assessment & Plan Note (Signed)
Off allergy vaccines x7/2010 > highly allergic skin tests per Dr Annamaria Boots > restarted vaccine November 06, 2009  Allergy vaccine 1:50:000 10/30/09; adv to 1:10 11/20/10 [facility]. D/C 07/30/13  May need to revisit if symptoms and need for prednisone increase

## 2014-07-27 NOTE — Assessment & Plan Note (Signed)
-   Target LDL < 130 due to h/o hbp/ male gender  Lab Results  Component Value Date   CHOL 189 07/25/2014   HDL 41.90 07/25/2014   LDLCALC 113* 07/25/2014   LDLDIRECT 124.7 04/30/2013   TRIG 169.0* 07/25/2014   CHOLHDL 5 07/25/2014     Adequate control on present rx, reviewed > no change in rx needed

## 2014-07-27 NOTE — Assessment & Plan Note (Signed)
GERD>>endo 03/20/11 -esophageal stricture-s/p dilatation, ulcerated stricture-bx was c/w gerd -neg for barretts. Rec for PPI  F/by Dr. Danise [facility]   Adequate control on present rx, reviewed > no change in rx needed

## 2014-08-22 ENCOUNTER — Ambulatory Visit (INDEPENDENT_AMBULATORY_CARE_PROVIDER_SITE_OTHER): Payer: Medicare Other | Admitting: Adult Health

## 2014-08-22 ENCOUNTER — Encounter: Payer: Self-pay | Admitting: Adult Health

## 2014-08-22 ENCOUNTER — Ambulatory Visit: Payer: Medicare Other | Admitting: Internal Medicine

## 2014-08-22 VITALS — BP 138/84 | HR 86 | Ht 68.0 in | Wt 198.2 lb

## 2014-08-22 DIAGNOSIS — J45909 Unspecified asthma, uncomplicated: Secondary | ICD-10-CM | POA: Diagnosis not present

## 2014-08-22 DIAGNOSIS — I10 Essential (primary) hypertension: Secondary | ICD-10-CM

## 2014-08-22 DIAGNOSIS — J4550 Severe persistent asthma, uncomplicated: Secondary | ICD-10-CM

## 2014-08-22 NOTE — Progress Notes (Signed)
Subjective:    Patient ID: [name]    DOB: [birth year]   Brief patient profile: 84 yobm denies ever smoking but has lifelong asthma with severe chronic pattern and best FEV1 of around 2.2 liters documented 2002 followed in pulmonary for primary care also for hbp and hyperlipidemia.  HPI   07/30/13 Allergy eval rx complete x 4 years > d/c and f/u allergy prn    07/25/2014 f/u ov/Wert re: severe chronic asthma/hbp/gerd  Chief Complaint  Patient presents with  . Annual Exam    Pt is fasting. He states still having some increased wheezing and dyspnea since last visit- relates to hot weather. Using rescue inhaler daily.  Has used neb x 1 since last visit.   >>prevnar /TD vaccine   08/22/2014 Follow up and Med Review  Pt returns for 4 week follow up for Asthma  And Med review  Pt brought all his meds in today for review . We reviewed all his meds and organized them into a med calendar w/ pt education . Unfortunately he brought in 2  Dulera inhalers one that is empty and 1 that has 64 doses in it, not sure which one he is using.  He is not taking omeprazole . Unclear if taking benicar We discussed importance of med compliance.  Says he forgets sometimes.  We discussed his poor dentition, he awaiting dental ov.  No chest pain , orthopnea, edema or feve.r  Says overall breathing is doing well with no flare in cough or wheezing.      Current Medications, Allergies, Complete Past Medical History, Past Surgical History, Family History, and Social History were reviewed in [employer] record.  ROS  The following are not active complaints unless bolded sore throat, dysphagia, dental problems, itching, sneezing,  nasal congestion or excess/ purulent secretions, ear ache,   fever, chills, sweats, unintended wt loss, pleuritic or exertional cp, hemoptysis,  orthopnea pnd or leg swelling, presyncope, palpitations, heartburn, abdominal pain, anorexia, nausea, vomiting, diarrhea   or change in bowel or urinary habits, change in stools or urine, dysuria,hematuria,  rash, arthralgias, visual complaints, headache, numbness weakness or ataxia or problems with walking or coordination,  change in mood/affect or memory.              Past Medical Hx ASTHMA (ICD-493.90)  --chronic severe asthma with best FEV1 2.2 L documented 01/2001 and  documented non-adherence Mainly lives off samples of inhalers and nose sprays  - HFA 75% June 15, 2009 > 90% August 02, 2009 > 100% December 27, 2009  - Off allergy vaccines x7/2010 > highly allergic skin tests per Dr Annamaria Boots > restarted vaccine November 06, 2009 > stopped  07/2013  SHOULDER PAIN, RIGHT (ICD-719.41)   HYPERTENSION, BORDERLINE (ICD-401.9)  Shingles T 8 Left 9/01  HEALTH MAINTENANCE...............................................................................................Marland KitchenWert  - Colonoscopy 12/1989 , 2011  - dT 07/25/2014  - Pneumovax 3/04 and 05/2009 - Prevnar 07/25/14 -Flu declined October 17, 2008 and September 21, 2009  -CPX 07/25/2014  - Meds reviewed with pt education and computerized med calendar adjusted October 19, 2009, February 20, 2010, September 10, 2010 , 05/22/2012 , 03/01/2013, 10/26/2013 , 03/30/2014 . 07/04/2014 ,. 08/22/2014            Objective:   Physical Exam    amb bm nad     05/23/2014         201> 201 07/04/2014 > 07/25/14  196>198 08/22/2014      HEENT: nl dentition, turbinates, and orophanx.  Nl external ear canals without cough reflex, poor dentition    NECK :  without JVD/Nodes/TM/ nl carotid upstrokes bilaterally   LUNGS: no acc muscle use, hyperresonant to percussion,  Distant BS w/ no wheezing    CV:  RRR  no s3 or murmur or increase in P2, no edema   ABD:  soft and nontender with nl excursion in the supine position. No bruits or organomegaly, bowel sounds nl  MS:  Nl gait, warm without deformities, nl gait, no calf tenderness, cyanosis or clubbing      CXR  11/22/2013 : Increased  peribronchial thickening with minimal atelectasis at the left lung base. Findings are consistent with bronchitis       Assessment & Plan:

## 2014-08-22 NOTE — Patient Instructions (Signed)
Follow up Dr. Melvyn Novas  In 2 months and As needed   Follow med calenadar closely and bring to each visit.

## 2014-08-22 NOTE — Assessment & Plan Note (Signed)
Controlled  Low salt diet

## 2014-08-22 NOTE — Assessment & Plan Note (Addendum)
Chronic asthma with frequent flares partially due to med compliance.  Patient's medications were reviewed today and patient education was given. Computerized medication calendar was adjusted/completed   Plan  Cont on current regimen  follow up 2 months  Advised on Dulera compliance

## 2014-08-24 ENCOUNTER — Encounter: Payer: Self-pay | Admitting: Internal Medicine

## 2014-09-19 ENCOUNTER — Ambulatory Visit (INDEPENDENT_AMBULATORY_CARE_PROVIDER_SITE_OTHER): Payer: Medicare Other | Admitting: Adult Health

## 2014-09-19 ENCOUNTER — Telehealth: Payer: Self-pay | Admitting: Internal Medicine

## 2014-09-19 ENCOUNTER — Encounter: Payer: Self-pay | Admitting: Adult Health

## 2014-09-19 VITALS — BP 120/74 | HR 73 | Temp 98.0°F | Ht 68.0 in | Wt 194.8 lb

## 2014-09-19 DIAGNOSIS — M7989 Other specified soft tissue disorders: Secondary | ICD-10-CM | POA: Diagnosis not present

## 2014-09-19 DIAGNOSIS — R2240 Localized swelling, mass and lump, unspecified lower limb: Secondary | ICD-10-CM | POA: Insufficient documentation

## 2014-09-19 NOTE — Progress Notes (Signed)
Pt seen and examined and agree with a/p

## 2014-09-19 NOTE — Progress Notes (Signed)
Subjective:    Patient ID: [name]    DOB: 04/07/1941   Brief patient profile: 25 yobm denies ever smoking but has lifelong asthma with severe chronic pattern and best FEV1 of around 2.2 liters documented 2002 followed in pulmonary for primary care also for hbp and hyperlipidemia.  HPI   07/30/13 Allergy eval rx complete x 4 years > d/c and f/u allergy prn    07/25/2014 f/u ov/Wert re: severe chronic asthma/hbp/gerd  Chief Complaint  Patient presents with  . Annual Exam    Pt is fasting. He states still having some increased wheezing and dyspnea since last visit- relates to hot weather. Using rescue inhaler daily.  Has used neb x 1 since last visit.   >>prevnar /TD vaccine   08/22/2014 Follow up and Med Review  Pt returns for 4 week follow up for Asthma  And Med review  Pt brought all his meds in today for review . We reviewed all his meds and organized them into a med calendar w/ pt education . Unfortunately he brought in 2  Dulera inhalers one that is empty and 1 that has 64 doses in it, not sure which one he is using.  He is not taking omeprazole . Unclear if taking benicar We discussed importance of med compliance.  Says he forgets sometimes.  We discussed his poor dentition, he awaiting dental ov.  No chest pain , orthopnea, edema or feve.r  Says overall breathing is doing well with no flare in cough or wheezing.  >>no changes   09/19/2014 Acute OV  Complains of swelling along right side between calf and shin on right leg. noticed this last PM.  No pain. No injury no soreness. No chest pain, hemoptyiss ,or dyspnea.  Used some biofreeze on the calf with no change in swelling.  Denies any redness, calf pain, painful with movement or gait.  Noticed along the lower right leg "goose egg" popped up , not tender  With no redness.  No recent long distance travel.  No hx of DVT      Current Medications, Allergies, Complete Past Medical History, Past Surgical History, Family  History, and Social History were reviewed in [employer] record.  ROS  The following are not active complaints unless bolded sore throat, dysphagia, dental problems, itching, sneezing,  nasal congestion or excess/ purulent secretions, ear ache,   fever, chills, sweats, unintended wt loss, pleuritic or exertional cp, hemoptysis,  orthopnea pnd or leg swelling, presyncope, palpitations, heartburn, abdominal pain, anorexia, nausea, vomiting, diarrhea  or change in bowel or urinary habits, change in stools or urine, dysuria,hematuria,  Rash, isual complaints, headache, numbness weakness or ataxia or problems with walking or coordination,  change in mood/affect or memory.              Past Medical Hx ASTHMA (ICD-493.90)  --chronic severe asthma with best FEV1 2.2 L documented 01/2001 and  documented non-adherence Mainly lives off samples of inhalers and nose sprays  - HFA 75% June 15, 2009 > 90% August 02, 2009 > 100% December 27, 2009  - Off allergy vaccines x7/2010 > highly allergic skin tests per Dr Annamaria Boots > restarted vaccine November 06, 2009 > stopped  07/2013  SHOULDER PAIN, RIGHT (ICD-719.41)   HYPERTENSION, BORDERLINE (ICD-401.9)  Shingles T 8 Left 9/01  HEALTH MAINTENANCE...............................................................................................Marland KitchenWert  - Colonoscopy 12/1989 , 2011  - dT 07/25/2014  - Pneumovax 3/04 and 05/2009 - Prevnar 07/25/14 -Flu declined October 17, 2008 and September 21, 2009  -CPX 07/25/2014  - Meds reviewed with pt education and computerized med calendar adjusted October 19, 2009, February 20, 2010, September 10, 2010 , 05/22/2012 , 03/01/2013, 10/26/2013 , 03/30/2014 . 07/04/2014 ,. 08/22/2014            Objective:   Physical Exam    amb bm nad     05/23/2014         201> 201 07/04/2014 > 07/25/14  196>198 08/22/2014 >194 09/19/2014      HEENT: nl dentition, turbinates, and orophanx. Nl external ear canals without cough reflex, poor  dentition    NECK :  without JVD/Nodes/TM/ nl carotid upstrokes bilaterally   LUNGS: no acc muscle use, hyperresonant to percussion,  Distant BS w/ no wheezing    CV:  RRR  no s3 or murmur or increase in P2, no edema   ABD:  soft and nontender with nl excursion in the supine position. No bruits or organomegaly, bowel sounds nl  MS:  Nl gait, warm without deformities, nl gait, no calf tenderness, cyanosis or clubbing Neg homans sign ,nontender calf  Along medial right lower leg -small soft nontender swelling noted , no redness , post knee nontender and no swelling     CXR  11/22/2013 : Increased peribronchial thickening with minimal atelectasis at the left lung base. Findings are consistent with bronchitis       Assessment & Plan:

## 2014-09-19 NOTE — Patient Instructions (Signed)
Keep leg elevated as able  Will refer to ortho for evaluation  Tylenol As needed if develops pain Call back if changes with redness or pain or worsening swelling Please contact office for sooner follow up if symptoms do not improve or worsen or seek emergency care  Follow up Dr. Melvyn Novas  As planned and As needed

## 2014-09-19 NOTE — Telephone Encounter (Signed)
Called pt. appt scheduled to see TP this afternoon. Nothing further needed

## 2014-09-19 NOTE — Addendum Note (Signed)
Addended by: Parke Poisson E on: 09/19/2014 02:36 PM   Modules accepted: Orders

## 2014-09-19 NOTE — Assessment & Plan Note (Signed)
Sudden onset of localized right lower leg swelling ? Etiology possible muscular in nature .  No redness , neg homans sign and no pain -low suspicion for DVT -appears more muscular +/- skeletal in nature  Doubt baker's cyst with no pain or swelling post knee.  Will refer to Ortho for evaluation -hold on xray for now .

## 2014-09-20 DIAGNOSIS — R229 Localized swelling, mass and lump, unspecified: Secondary | ICD-10-CM | POA: Diagnosis not present

## 2014-09-28 DIAGNOSIS — R2241 Localized swelling, mass and lump, right lower limb: Secondary | ICD-10-CM | POA: Diagnosis not present

## 2014-10-04 DIAGNOSIS — R2241 Localized swelling, mass and lump, right lower limb: Secondary | ICD-10-CM | POA: Diagnosis not present

## 2014-10-10 ENCOUNTER — Telehealth: Payer: Self-pay | Admitting: Internal Medicine

## 2014-10-10 NOTE — Telephone Encounter (Signed)
Pt returned call & can be reached at [phone].  [name]

## 2014-10-10 NOTE — Telephone Encounter (Signed)
Returning a call [phone]

## 2014-10-10 NOTE — Telephone Encounter (Signed)
LM x1 for pt to return call.

## 2014-10-10 NOTE — Telephone Encounter (Signed)
Called, spoke with pt. C/o PND and cough with yellowish green mucus x 1 wk.  Denies chest tightness, increased SOB, or f/c/s.   Requesting abx -  Offered OV today; pt declined. OV scheduled for tomorrow at 9:15 am with TP.  Pt confirmed appt and voiced no further questions or concerns at this time.

## 2014-10-11 ENCOUNTER — Encounter: Payer: Self-pay | Admitting: Adult Health

## 2014-10-11 ENCOUNTER — Ambulatory Visit (INDEPENDENT_AMBULATORY_CARE_PROVIDER_SITE_OTHER): Payer: Medicare Other | Admitting: Adult Health

## 2014-10-11 VITALS — BP 106/58 | HR 83 | Temp 97.0°F | Ht 68.0 in | Wt 198.8 lb

## 2014-10-11 DIAGNOSIS — J209 Acute bronchitis, unspecified: Secondary | ICD-10-CM | POA: Insufficient documentation

## 2014-10-11 MED ORDER — CEFDINIR 300 MG PO CAPS: 300.0000 mg | ORAL_CAPSULE | Freq: Two times a day (BID) | ORAL | Status: DC

## 2014-10-11 MED ORDER — PROMETHAZINE-CODEINE 6.25-10 MG/5ML PO SYRP: 5.0000 mL | ORAL_SOLUTION | Freq: Four times a day (QID) | ORAL | Status: DC | PRN

## 2014-10-11 NOTE — Patient Instructions (Signed)
Omnicef Twice daily  for 7 days  Mucinex DM Twice daily  As needed  Cough/congestion  Phenergan w/ Codeine 1 tsp every 6hrs as needed , may make you sleepy.  Fluids and rest  Please contact office for sooner follow up if symptoms do not improve or worsen or seek emergency care  Follow up in November as planned

## 2014-10-11 NOTE — Progress Notes (Signed)
Subjective:    Patient ID: [name]    DOB: [birth year]   Brief patient profile: 33 yobm denies ever smoking but has lifelong asthma with severe chronic pattern and best FEV1 of around 2.2 liters documented 2002 followed in pulmonary for primary care also for hbp and hyperlipidemia.  HPI   07/30/13 Allergy eval rx complete x 4 years > d/c and f/u allergy prn    07/25/2014 f/u ov/Wert re: severe chronic asthma/hbp/gerd  Chief Complaint  Patient presents with  . Annual Exam    Pt is fasting. He states still having some increased wheezing and dyspnea since last visit- relates to hot weather. Using rescue inhaler daily.  Has used neb x 1 since last visit.   >>prevnar /TD vaccine   08/22/2014 Follow up and Med Review  Pt returns for 4 week follow up for Asthma  And Med review  Pt brought all his meds in today for review . We reviewed all his meds and organized them into a med calendar w/ pt education . Unfortunately he brought in 2  Dulera inhalers one that is empty and 1 that has 64 doses in it, not sure which one he is using.  He is not taking omeprazole . Unclear if taking benicar We discussed importance of med compliance.  Says he forgets sometimes.  We discussed his poor dentition, he awaiting dental ov.  No chest pain , orthopnea, edema or feve.r  Says overall breathing is doing well with no flare in cough or wheezing.  >>no changes   09/19/2014 Acute OV  Complains of swelling along right side between calf and shin on right leg. noticed this last PM.  No pain. No injury no soreness. No chest pain, hemoptyiss ,or dyspnea.  Used some biofreeze on the calf with no change in swelling.  Denies any redness, calf pain, painful with movement or gait.  Noticed along the lower right leg "goose egg" popped up , not tender  With no redness.  No recent long distance travel.  No hx of DVT  >>refer to ortho   10/11/2014 Acute OV  Pt complains of productive cough with yellow/green mucus, some  wheezing and tightness at night x1 week.  Denies any dyspnea, f/c/s, n/v/d, hemoptysis. Using Mucinex without much help. No recent abx.  Was seen last ov with right lower leg muscle pain , referred to ortho and informed that was a muscle strain, resolve w/in few weeks. No further issues. . No notes available for review.  Sings in gospel group -"Chosen" .      Current Medications, Allergies, Complete Past Medical History, Past Surgical History, Family History, and Social History were reviewed in [employer] record.  ROS  The following are not active complaints unless bolded sore throat, dysphagia, dental problems, itching,  , ear ache,   fever, chills, sweats, unintended wt loss, pleuritic or exertional cp, hemoptysis,  orthopnea pnd or leg swelling, presyncope, palpitations, heartburn, abdominal pain, anorexia, nausea, vomiting, diarrhea  or change in bowel or urinary habits, change in stools or urine, dysuria,hematuria,  Rash, isual complaints, headache, numbness weakness or ataxia or problems with walking or coordination,  change in mood/affect or memory.           Past Medical Hx ASTHMA (ICD-493.90)  --chronic severe asthma with best FEV1 2.2 L documented 01/2001 and  documented non-adherence Mainly lives off samples of inhalers and nose sprays  - HFA 75% June 15, 2009 > 90% August 02, 2009 >  100% December 27, 2009  - Off allergy vaccines x7/2010 > highly allergic skin tests per Dr Annamaria Boots > restarted vaccine November 06, 2009 > stopped  07/2013  SHOULDER PAIN, RIGHT (ICD-719.41)   HYPERTENSION, BORDERLINE (ICD-401.9)  Shingles T 8 Left 9/01  HEALTH MAINTENANCE...............................................................................................Marland KitchenWert  - Colonoscopy 12/1989 , 2011  - dT 07/25/2014  - Pneumovax 3/04 and 05/2009 - Prevnar 07/25/14 -Flu declined October 17, 2008 and September 21, 2009 , declines flu shot 2015  -CPX 07/25/2014  - Meds reviewed with pt  education and computerized med calendar adjusted October 19, 2009, February 20, 2010, September 10, 2010 , 05/22/2012 , 03/01/2013, 10/26/2013 , 03/30/2014 . 07/04/2014 ,. 08/22/2014            Objective:   Physical Exam    amb bm nad     05/23/2014         201> 201 07/04/2014 > 07/25/14  196>198 08/22/2014 >194 09/19/2014 >198 10/11/2014      HEENT: nl dentition, turbinates, and orophanx. Nl external ear canals without cough reflex, poor dentition    NECK :  without JVD/Nodes/TM/ nl carotid upstrokes bilaterally   LUNGS: no acc muscle use, hyperresonant to percussion,  Distant BS w/ no wheezing    CV:  RRR  no s3 or murmur or increase in P2, no edema   ABD:  soft and nontender with nl excursion in the supine position. No bruits or organomegaly, bowel sounds nl  MS:  Nl gait, warm without deformities, nl gait, no calf tenderness, cyanosis or clubbing       CXR  07/25/14  Stable peribronchial thickening compatible with bronchitis.  Bibasilar atelectasis.       Assessment & Plan:

## 2014-10-11 NOTE — Assessment & Plan Note (Signed)
Omnicef Twice daily  for 7 days  Mucinex DM Twice daily  As needed  Cough/congestion  Phenergan w/ Codeine 1 tsp every 6hrs as needed , may make you sleepy.  Fluids and rest  Please contact office for sooner follow up if symptoms do not improve or worsen or seek emergency care  Follow up in November as planned

## 2014-10-24 ENCOUNTER — Ambulatory Visit (INDEPENDENT_AMBULATORY_CARE_PROVIDER_SITE_OTHER): Payer: Medicare Other | Admitting: Internal Medicine

## 2014-10-24 ENCOUNTER — Encounter: Payer: Self-pay | Admitting: Internal Medicine

## 2014-10-24 VITALS — BP 140/90 | HR 66 | Ht 68.0 in | Wt 196.4 lb

## 2014-10-24 DIAGNOSIS — I10 Essential (primary) hypertension: Secondary | ICD-10-CM | POA: Diagnosis not present

## 2014-10-24 DIAGNOSIS — J4550 Severe persistent asthma, uncomplicated: Secondary | ICD-10-CM | POA: Diagnosis not present

## 2014-10-24 MED ORDER — MOMETASONE FURO-FORMOTEROL FUM 200-5 MCG/ACT IN AERO: 2.0000 | INHALATION_SPRAY | Freq: Two times a day (BID) | RESPIRATORY_TRACT | Status: DC

## 2014-10-24 NOTE — Patient Instructions (Addendum)
Ok to leave off the omeprazole as per your med calendar    Please schedule a follow up office visit in 4 weeks, sooner if needed to see Tammy with your drug formulary (related to blood pressure and asthma medications)  so we work together to keep you from running out of medications.  Late dulera count 41 and given 2 boxes

## 2014-10-24 NOTE — Assessment & Plan Note (Signed)
--  chronic severe asthma with best FEV1 2.2 L documented 01/2001 and documented non-adherence Mainly lives off samples of inhalers and nose sprays  - Off allergy vaccines x7/2010 > highly allergic skin tests per Dr Annamaria Boots > restart vaccine November 06, 2009>stop 07/30/13 to watch - Spirometry on "good day" 01/14/12 =  FEV1 1.54  Ratio 0.58 - 05/23/14 p extensive coaching HFA effectiveness =    90% from baseline of 50%   DDX of  difficult airways management all start with A and  include Adherence, Ace Inhibitors, Acid Reflux, Active Sinus Disease, Alpha 1 Antitripsin deficiency, Anxiety masquerading as Airways dz,  ABPA,  allergy(esp in young), Aspiration (esp in elderly), Adverse effects of DPI,  Active smokers, plus two Bs  = Bronchiectasis and Beta blocker use..and one C= CHF  Adherence is always the initial "prime suspect" and is a multilayered concern that requires a "trust but verify" approach in every patient - starting with knowing how to use medications, especially inhalers, correctly, keeping up with refills and understanding the fundamental difference between maintenance and prns vs those medications only taken for a very short course and then stopped and not refilled.  - need to do a trust but verify visit each time (See instructions for specific recommendations which were reviewed directly with the patient who was given a copy with highlighter outlining the key components.)  ? Acid (or non-acid) GERD > always difficult to exclude as up to 75% of pts in some series report no assoc GI/ Heartburn symptoms> rec reduce pepcid  To hs only to see what difference if any this makes

## 2014-10-24 NOTE — Progress Notes (Signed)
Subjective:   Patient ID: [name]    DOB: Aug 12, 1941      Brief patient profile: 5 yobm denies ever smoking but has lifelong asthma with severe chronic pattern and best FEV1 of around 2.2 liters documented 2002 followed in pulmonary for primary care also for hbp and hyperlipidemia.    HPI   07/30/13 Allergy eval rx complete x 4 years > d/c and f/u allergy prn    07/25/2014 f/u ov/Stewart Pimenta re: severe chronic asthma/hbp/gerd  Chief Complaint  Patient presents with  . Annual Exam    Pt is fasting. He states still having some increased wheezing and dyspnea since last visit- relates to hot weather. Using rescue inhaler daily.  Has used neb x 1 since last visit.   >>prevnar /TD vaccine    10/24/2014 f/u ov/Shawntina Diffee re: severe asthma/dep on samples / f/u hbp/ also sample dep  Chief Complaint  Patient presents with  . Follow-up    Pt states that his cough is better. Breathing back to his normal baseline. No new co's today.   last saba one day prior to [place]    Not limited by breathing from desired activities    No obvious day to day or daytime variabilty or assoc chronic cough or cp or chest tightness, subjective wheeze overt sinus or hb symptoms. No unusual exp hx or h/o childhood pna/ asthma or knowledge of premature birth.  Sleeping ok without nocturnal  or early am exacerbation  of respiratory  c/o's or need for noct saba. Also denies any obvious fluctuation of symptoms with weather or environmental changes or other aggravating or alleviating factors except as outlined above   Current Medications, Allergies, Complete Past Medical History, Past Surgical History, Family History, and Social History were reviewed in [employer] record.  ROS  The following are not active complaints unless bolded sore throat, dysphagia, dental problems, itching, sneezing,  nasal congestion or excess/ purulent secretions, ear ache,   fever, chills, sweats, unintended wt loss, pleuritic or  exertional cp, hemoptysis,  orthopnea pnd or leg swelling, presyncope, palpitations, heartburn, abdominal pain, anorexia, nausea, vomiting, diarrhea  or change in bowel or urinary habits, change in stools or urine, dysuria,hematuria,  rash, arthralgias, visual complaints, headache, numbness weakness or ataxia or problems with walking or coordination,  change in mood/affect or memory.            .  Past Medical Hx ASTHMA (ICD-493.90)  --chronic severe asthma with best FEV1 2.2 L documented 01/2001 and  documented non-adherence Mainly lives off samples of inhalers and nose sprays  - HFA 75% June 15, 2009 > 90% August 02, 2009 > 100% December 27, 2009  - Off allergy vaccines x7/2010 > highly allergic skin tests per Dr Annamaria Boots > restarted vaccine November 06, 2009 > stopped  07/2013  SHOULDER PAIN, RIGHT (ICD-719.41)   HYPERTENSION, BORDERLINE (ICD-401.9)  Shingles T 8 Left 9/01  HEALTH MAINTENANCE...............................................................................................Marland KitchenWert  - Colonoscopy 12/1989 , 2011  - dT 07/25/2014  - Pneumovax 3/04 and 05/2009 - Prevnar 07/25/14 -Flu declined October 17, 2008 and September 21, 2009 , declines flu shot 2015  -CPX 07/25/2014  - Meds reviewed with pt education and computerized med calendar adjusted October 19, 2009, February 20, 2010, September 10, 2010 , 05/22/2012 , 03/01/2013, 10/26/2013 , 03/30/2014 . 07/04/2014 ,. 08/22/2014            Objective:   Physical Exam    amb bm nad     05/23/2014  201> 201  07/04/2014 > 07/25/14  196>198 08/22/2014 >194 09/19/2014 >198 10/11/2014 > 10/24/2014  196     HEENT: nl dentition, turbinates, and orophanx. Nl external ear canals without cough reflex, poor dentition    NECK :  without JVD/Nodes/TM/ nl carotid upstrokes bilaterally   LUNGS: no acc muscle use, hyperresonant to percussion,  Distant BS w/ no wheezing    CV:  RRR  no s3 or murmur or increase in P2, no edema   ABD:  soft and nontender with nl  excursion in the supine position. No bruits or organomegaly, bowel sounds nl  MS:  Nl gait, warm without deformities, nl gait, no calf tenderness, cyanosis or clubbing       CXR  07/25/14  Stable peribronchial thickening compatible with bronchitis.  Bibasilar atelectasis.       Assessment & Plan:

## 2014-10-24 NOTE — Assessment & Plan Note (Signed)
Adequate control on present rx, reviewed > no change in rx needed  But does need to be on a formulary/affordable arb (asked him to bring it next ov)

## 2014-10-26 ENCOUNTER — Encounter: Payer: Self-pay | Admitting: Internal Medicine

## 2014-11-02 DIAGNOSIS — M9914 Subluxation complex (vertebral) of sacral region: Secondary | ICD-10-CM | POA: Diagnosis not present

## 2014-11-02 DIAGNOSIS — M9911 Subluxation complex (vertebral) of cervical region: Secondary | ICD-10-CM | POA: Diagnosis not present

## 2014-11-02 DIAGNOSIS — M5033 Other cervical disc degeneration, cervicothoracic region: Secondary | ICD-10-CM | POA: Diagnosis not present

## 2014-11-02 DIAGNOSIS — M5136 Other intervertebral disc degeneration, lumbar region: Secondary | ICD-10-CM | POA: Diagnosis not present

## 2014-11-02 DIAGNOSIS — M9912 Subluxation complex (vertebral) of thoracic region: Secondary | ICD-10-CM | POA: Diagnosis not present

## 2014-11-02 DIAGNOSIS — M9915 Subluxation complex (vertebral) of pelvic region: Secondary | ICD-10-CM | POA: Diagnosis not present

## 2014-11-02 DIAGNOSIS — M9913 Subluxation complex (vertebral) of lumbar region: Secondary | ICD-10-CM | POA: Diagnosis not present

## 2014-11-21 ENCOUNTER — Encounter: Payer: Self-pay | Admitting: Adult Health

## 2014-11-21 ENCOUNTER — Ambulatory Visit (INDEPENDENT_AMBULATORY_CARE_PROVIDER_SITE_OTHER): Payer: Medicare Other | Admitting: Adult Health

## 2014-11-21 VITALS — BP 128/80 | HR 74 | Temp 96.9°F | Ht 68.0 in | Wt 195.4 lb

## 2014-11-21 DIAGNOSIS — K219 Gastro-esophageal reflux disease without esophagitis: Secondary | ICD-10-CM | POA: Diagnosis not present

## 2014-11-21 DIAGNOSIS — J3089 Other allergic rhinitis: Secondary | ICD-10-CM

## 2014-11-21 DIAGNOSIS — J309 Allergic rhinitis, unspecified: Secondary | ICD-10-CM

## 2014-11-21 DIAGNOSIS — I10 Essential (primary) hypertension: Secondary | ICD-10-CM

## 2014-11-21 DIAGNOSIS — J4550 Severe persistent asthma, uncomplicated: Secondary | ICD-10-CM | POA: Diagnosis not present

## 2014-11-21 DIAGNOSIS — J302 Other seasonal allergic rhinitis: Secondary | ICD-10-CM

## 2014-11-21 MED ORDER — MOMETASONE FUROATE 50 MCG/ACT NA SUSP: 1.0000 | Freq: Two times a day (BID) | NASAL | Status: DC

## 2014-11-21 MED ORDER — MOMETASONE FURO-FORMOTEROL FUM 200-5 MCG/ACT IN AERO: 2.0000 | INHALATION_SPRAY | Freq: Two times a day (BID) | RESPIRATORY_TRACT | Status: DC

## 2014-11-21 NOTE — Assessment & Plan Note (Signed)
Compensated without flare 

## 2014-11-21 NOTE — Patient Instructions (Signed)
Follow up in 6 weeks with Dr. Melvyn Novas and As needed   Bring Drug  Formulary to next office visit .

## 2014-11-21 NOTE — Progress Notes (Signed)
Subjective:   Patient ID: [name]    DOB: December 17, 1941      Brief patient profile: 4 yobm denies ever smoking but has lifelong asthma with severe chronic pattern and best FEV1 of around 2.2 liters documented 2002 followed in pulmonary for primary care also for hbp and hyperlipidemia.    HPI   07/30/13 Allergy eval rx complete x 4 years > d/c and f/u allergy prn    07/25/2014 f/u ov/Wert re: severe chronic asthma/hbp/gerd  Chief Complaint  Patient presents with  . Annual Exam    Pt is fasting. He states still having some increased wheezing and dyspnea since last visit- relates to hot weather. Using rescue inhaler daily.  Has used neb x 1 since last visit.   >>prevnar /TD vaccine    10/24/2014 f/u ov/Wert re: severe asthma/dep on samples / f/u hbp/ also sample dep  Chief Complaint  Patient presents with  . Follow-up    Pt states that his cough is better. Breathing back to his normal baseline. No new co's today.   last saba one day prior to OV   >no changes   11/21/2014 Follow up  severe asthma/HTN dep on samples  Patient returns for a one-month follow-up. Overall  he's been doing well with no flare cough, wheezing or shortness of breath We reviewed his medications with patient education. Patient does have trouble affording his co-pays. He has Medicare and therefore does not benefit from coupons. We discussed him bringing in his Medicare formulary.  Going to dentist this week to evaluate dental issues.  Patient denies any chest pain, orthopnea, PND, leg swelling, hemoptysis.  Current Medications, Allergies, Complete Past Medical History, Past Surgical History, Family History, and Social History were reviewed in [employer] record.  ROS  The following are not active complaints unless bolded sore throat, dysphagia, dental problems, itching, sneezing,  nasal congestion or excess/ purulent secretions, ear ache,   fever, chills, sweats, unintended wt loss,  pleuritic or exertional cp, hemoptysis,  orthopnea pnd or leg swelling, presyncope, palpitations, heartburn, abdominal pain, anorexia, nausea, vomiting, diarrhea  or change in bowel or urinary habits, change in stools or urine, dysuria,hematuria,  rash, arthralgias, visual complaints, headache, numbness weakness or ataxia or problems with walking or coordination,  change in mood/affect or memory.            .  Past Medical Hx ASTHMA (ICD-493.90)  --chronic severe asthma with best FEV1 2.2 L documented 01/2001 and  documented non-adherence Mainly lives off samples of inhalers and nose sprays  - HFA 75% June 15, 2009 > 90% August 02, 2009 > 100% December 27, 2009  - Off allergy vaccines x7/2010 > highly allergic skin tests per Dr Annamaria Boots > restarted vaccine November 06, 2009 > stopped  07/2013  SHOULDER PAIN, RIGHT (ICD-719.41)   HYPERTENSION, BORDERLINE (ICD-401.9)  Shingles T 8 Left 9/01  HEALTH MAINTENANCE...............................................................................................Marland KitchenWert  - Colonoscopy 12/1989 , 2011  - dT 07/25/2014  - Pneumovax 3/04 and 05/2009 - Prevnar 07/25/14 -Flu declined October 17, 2008 and September 21, 2009 , declines flu shot 2015  -CPX 07/25/2014  - Meds reviewed with pt education and computerized med calendar adjusted October 19, 2009, February 20, 2010, September 10, 2010 , 05/22/2012 , 03/01/2013, 10/26/2013 , 03/30/2014 . 07/04/2014 ,. 08/22/2014            Objective:   Physical Exam    amb bm nad     05/23/2014  201> 201 07/04/2014 > 07/25/14  098>119 08/22/2014 >194 09/19/2014 >198 10/11/2014 > 10/24/2014  196>195     HEENT: nl dentition, turbinates, and orophanx. Nl external ear canals without cough reflex, poor dentition    NECK :  without JVD/Nodes/TM/ nl carotid upstrokes bilaterally   LUNGS: no acc muscle use, hyperresonant to percussion,  Distant BS w/ no wheezing    CV:  RRR  no s3 or murmur or increase in P2, no edema   ABD:  soft and  nontender with nl excursion in the supine position. No bruits or organomegaly, bowel sounds nl  MS:  Nl gait, warm without deformities, nl gait, no calf tenderness, cyanosis or clubbing       CXR  07/25/14  Stable peribronchial thickening compatible with bronchitis.  Bibasilar atelectasis.       Assessment & Plan:

## 2014-11-21 NOTE — Assessment & Plan Note (Signed)
Compensated on present regimen Follow up in 6 weeks

## 2014-11-21 NOTE — Assessment & Plan Note (Signed)
Compensated on present regimen Advised on a low sodium diet Follow-up in 6 weeks and as needed

## 2014-11-21 NOTE — Addendum Note (Signed)
Addended by: Virl Cagey on: 11/21/2014 10:04 AM   Modules accepted: Orders

## 2015-01-02 ENCOUNTER — Encounter: Payer: Self-pay | Admitting: Internal Medicine

## 2015-01-02 ENCOUNTER — Ambulatory Visit (INDEPENDENT_AMBULATORY_CARE_PROVIDER_SITE_OTHER): Payer: Medicare Other | Admitting: Internal Medicine

## 2015-01-02 VITALS — BP 126/74 | HR 78 | Ht 68.0 in | Wt 193.0 lb

## 2015-01-02 DIAGNOSIS — I10 Essential (primary) hypertension: Secondary | ICD-10-CM

## 2015-01-02 DIAGNOSIS — J4550 Severe persistent asthma, uncomplicated: Secondary | ICD-10-CM | POA: Diagnosis not present

## 2015-01-02 NOTE — Progress Notes (Signed)
Subjective:   Patient ID: [name]    DOB: 09/07/1941      Brief patient profile: 7 yobm denies ever smoking but has lifelong asthma with severe chronic pattern and best FEV1 of around 2.2 liters documented 2002 followed in pulmonary for primary care also for hbp and hyperlipidemia.    HPI   07/30/13 Allergy eval rx completed x 4 years > d/c'd and f/u allergy prn    07/25/2014 f/u ov/[name] re: severe chronic asthma/hbp/gerd  Chief Complaint  Patient presents with  . Annual Exam    Pt is fasting. He states still having some increased wheezing and dyspnea since last visit- relates to hot weather. Using rescue inhaler daily.  Has used neb x 1 since last visit.   >>prevnar /TD vaccine   01/02/2015 f/u ov/[name] re: dtc asthma/ hbp/ still dep on samples and did not bring formulary  Chief Complaint  Patient presents with  . Follow-up    Pt states that he is doing well and denies any new co's today. He is using proair 1 to 2 x per wk on average.       Not limited by breathing from desired activities  But very sedentary  No obvious day to day or daytime variabilty or assoc chronic cough or cp or chest tightness, subjective wheeze overt sinus or hb symptoms. No unusual exp hx or h/o childhood pna/ asthma or knowledge of premature birth.  Sleeping ok without nocturnal  or early am exacerbation  of respiratory  c/o's or need for noct saba. Also denies any obvious fluctuation of symptoms with weather or environmental changes or other aggravating or alleviating factors except as outlined above   Current Medications, Allergies, Complete Past Medical History, Past Surgical History, Family History, and Social History were reviewed in [employer] record.  ROS  The following are not active complaints unless bolded sore throat, dysphagia, dental problems, itching, sneezing,  nasal congestion or excess/ purulent secretions, ear ache,   fever, chills, sweats, unintended wt  loss, pleuritic or exertional cp, hemoptysis,  orthopnea pnd or leg swelling, presyncope, palpitations, heartburn, abdominal pain, anorexia, nausea, vomiting, diarrhea  or change in bowel or urinary habits, change in stools or urine, dysuria,hematuria,  rash, arthralgias, visual complaints, headache, numbness weakness or ataxia or problems with walking or coordination,  change in mood/affect or memory.             Past Medical Hx ASTHMA (ICD-493.90)  --chronic severe asthma with best FEV1 2.2 L documented 01/2001 and  documented non-adherence Mainly lives off samples of inhalers and nose sprays  - HFA 75% June 15, 2009 > 90% August 02, 2009 > 100% December 27, 2009  - Off allergy vaccines x7/2010 > highly allergic skin tests per Dr [name] > restarted vaccine November 06, 2009 > stopped  07/2013  SHOULDER PAIN, RIGHT (ICD-719.41)   HYPERTENSION, BORDERLINE (ICD-401.9)  Shingles T 8 Left 9/01  HEALTH MAINTENANCE...............................................................................................Marland KitchenWert  - Colonoscopy 12/1989 , 2011  - dT 07/25/2014  - Pneumovax 3/04 and 05/2009 - Prevnar 07/25/14 -Flu declined October 17, 2008 and September 21, 2009 , declines flu shot 2015  -CPX 07/25/2014  - Meds reviewed with pt education and computerized med calendar adjusted October 19, 2009, February 20, 2010, September 10, 2010 , 05/22/2012 , 03/01/2013, 10/26/2013 , 03/30/2014 . 07/04/2014 ,. 08/22/2014            Objective:   Physical Exam    amb bm nad  05/23/2014  201> 201 07/04/2014 > 07/25/14  196>198 08/22/2014 >194 09/19/2014 >198 10/11/2014 > 10/24/2014  196>   01/02/2015 193      HEENT: nl dentition, turbinates, and orophanx. Nl external ear canals without cough reflex, poor dentition    NECK :  without JVD/Nodes/TM/ nl carotid upstrokes bilaterally   LUNGS: no acc muscle use, hyperresonant to percussion,  Distant BS w/ no wheezing    CV:  RRR  no s3 or murmur or increase in P2, no edema    ABD:  soft and nontender with nl excursion in the supine position. No bruits or organomegaly, bowel sounds nl  MS:  Nl gait, warm without deformities, nl gait, no calf tenderness, cyanosis or clubbing       CXR  07/25/14  Stable peribronchial thickening compatible with bronchitis.  Bibasilar atelectasis.       Assessment & Plan:

## 2015-01-02 NOTE — Patient Instructions (Signed)
See calendar for specific medication instructions and bring it back for each and every office visit for every healthcare provider you see.  Without it,  you may not receive the best quality medical care that we feel you deserve.  You will note that the calendar groups together  your maintenance  medications that are timed at particular times of the day.  Think of this as your checklist for what your doctor has instructed you to do until your next evaluation to see what benefit  there is  to staying on a consistent group of medications intended to keep you well.  The other group at the bottom is entirely up to you to use as you see fit  for specific symptoms that may arise between visits that require you to treat them on an as needed basis.  Think of this as your action plan or "what if" list.   Separating the top medications from the bottom group is fundamental to providing you adequate care going forward.   See [name] w/in 2 weeks with all your medications, even over the counter meds, separated in two separate bags and your drug formulary

## 2015-01-05 ENCOUNTER — Encounter: Payer: Self-pay | Admitting: Internal Medicine

## 2015-01-05 NOTE — Assessment & Plan Note (Signed)
Adequate control on present rx, reviewed > no change in rx needed   

## 2015-01-05 NOTE — Assessment & Plan Note (Signed)
--  chronic severe asthma with best FEV1 2.2 L documented 01/2001 and documented non-adherence Mainly lives off samples of inhalers and nose sprays  - Off allergy vaccines x7/2010 > highly allergic skin tests per Dr Young > restart vaccine November 06, 2009>stop 07/30/13 to watch - Spirometry on "good day" 01/14/12 =  FEV1 1.54  Ratio 0.58 - 05/23/14 p extensive coaching HFA effectiveness =    90% from baseline of 50%   I had an extended discussion with the patient reviewing all relevant studies completed to date and  lasting 15 to 20 minutes of a 25 minute visit on the following ongoing concerns:  All goals of chronic asthma control met including optimal though certainly not nl function and elimination of symptoms with minimal need for rescue therapy  Contingencies discussed in full including contacting this office immediately if not controlling the symptoms using the rule of two's.     Reviewed issue of formulary restrictions and need to get him on a regimen where he is not 100% dep on samples, which tend to run out between visits.  

## 2015-01-16 ENCOUNTER — Ambulatory Visit (INDEPENDENT_AMBULATORY_CARE_PROVIDER_SITE_OTHER): Payer: Medicare Other | Admitting: Adult Health

## 2015-01-16 ENCOUNTER — Encounter: Payer: Self-pay | Admitting: Adult Health

## 2015-01-16 VITALS — BP 124/74 | HR 79 | Temp 97.5°F | Ht 68.0 in | Wt 195.8 lb

## 2015-01-16 DIAGNOSIS — J4550 Severe persistent asthma, uncomplicated: Secondary | ICD-10-CM

## 2015-01-16 DIAGNOSIS — I10 Essential (primary) hypertension: Secondary | ICD-10-CM | POA: Diagnosis not present

## 2015-01-16 MED ORDER — OLMESARTAN MEDOXOMIL 40 MG PO TABS: 20.0000 mg | ORAL_TABLET | Freq: Every day | ORAL | Status: DC

## 2015-01-16 MED ORDER — MOMETASONE FUROATE 50 MCG/ACT NA SUSP: 1.0000 | Freq: Two times a day (BID) | NASAL | Status: DC

## 2015-01-16 MED ORDER — MOMETASONE FURO-FORMOTEROL FUM 200-5 MCG/ACT IN AERO: 2.0000 | INHALATION_SPRAY | Freq: Two times a day (BID) | RESPIRATORY_TRACT | Status: DC

## 2015-01-16 MED ORDER — OLMESARTAN MEDOXOMIL 20 MG PO TABS: 20.0000 mg | ORAL_TABLET | Freq: Every day | ORAL | Status: DC

## 2015-01-16 NOTE — Assessment & Plan Note (Signed)
Controlled on current regimen Patient is to bring  Formulary back to the office  Plan  Follow up in 6 weeks with Dr. Melvyn Novas and As needed   Bring Drug  Formulary- this is very important to bring back to our office.  Follow med calendar closely and bring to each visit.  Please contact office for sooner follow up if symptoms do not improve or worsen or seek emergency care

## 2015-01-16 NOTE — Progress Notes (Signed)
Subjective:   Patient ID: [name]    DOB: 23-Dec-1941      Brief patient profile: 34 yobm denies ever smoking but has lifelong asthma with severe chronic pattern and best FEV1 of around 2.2 liters documented 2002 followed in pulmonary for primary care also for hbp and hyperlipidemia.    HPI   07/30/13 Allergy eval rx completed x 4 years > d/c'd and f/u allergy prn    07/25/2014 f/u ov/Wert re: severe chronic asthma/hbp/gerd  Chief Complaint  Patient presents with  . Annual Exam    Pt is fasting. He states still having some increased wheezing and dyspnea since last visit- relates to hot weather. Using rescue inhaler daily.  Has used neb x 1 since last visit.   >>prevnar /TD vaccine   01/02/2015 f/u ov/Wert re: dtc asthma/ hbp/ still dep on samples and did not bring formulary  Chief Complaint  Patient presents with  . Follow-up    Pt states that he is doing well and denies any new co's today. He is using proair 1 to 2 x per wk on average.    >>no changes   01/16/2015 Follow up and Med Review : Asthma  Patient returns for a one-month follow-up and medication review We reviewed all his medications organize them into a medication calendar with patient education Unfortunately, patient did not bring his formulary and for his visit today We did contact his pharmacy and several of his medications are not covered. We did provide samples and encouraged him to bring back a formulary so we can pick a medication that is covered. He agreed to get a copy and bring back to ofifce  Says overall breathing is doing good, with no flare of cough or wheezing.  Patient denies he chest pain, orthopnea, PND or leg swelling     Current Medications, Allergies, Complete Past Medical History, Past Surgical History, Family History, and Social History were reviewed in [employer] record.  ROS  The following are not active complaints unless bolded sore throat, dysphagia, dental  problems, itching, sneezing,  nasal congestion or excess/ purulent secretions, ear ache,   fever, chills, sweats, unintended wt loss, pleuritic or exertional cp, hemoptysis,  orthopnea pnd or leg swelling, presyncope, palpitations, heartburn, abdominal pain, anorexia, nausea, vomiting, diarrhea  or change in bowel or urinary habits, change in stools or urine, dysuria,hematuria,  rash, arthralgias, visual complaints, headache, numbness weakness or ataxia or problems with walking or coordination,  change in mood/affect or memory.             Past Medical Hx ASTHMA (ICD-493.90)  --chronic severe asthma with best FEV1 2.2 L documented 01/2001 and  documented non-adherence Mainly lives off samples of inhalers and nose sprays  - HFA 75% June 15, 2009 > 90% August 02, 2009 > 100% December 27, 2009  - Off allergy vaccines x7/2010 > highly allergic skin tests per Dr Annamaria Boots > restarted vaccine November 06, 2009 > stopped  07/2013  SHOULDER PAIN, RIGHT (ICD-719.41)   HYPERTENSION, BORDERLINE (ICD-401.9)  Shingles T 8 Left 9/01  HEALTH MAINTENANCE...............................................................................................Marland KitchenWert  - Colonoscopy 12/1989 , 2011  - dT 07/25/2014  - Pneumovax 3/04 and 05/2009 - Prevnar 07/25/14 -Flu declined October 17, 2008 and September 21, 2009 , declines flu shot 2015  -CPX 07/25/2014  - Meds reviewed with pt education and computerized med calendar adjusted October 19, 2009, February 20, 2010, September 10, 2010 , 05/22/2012 , 03/01/2013, 10/26/2013 , 03/30/2014 . 07/04/2014 ,. 08/22/2014 ,  01/16/2015            Objective:   Physical Exam    amb bm nad     05/23/2014  201> 201 07/04/2014 > 07/25/14  196>198 08/22/2014 >194 09/19/2014 >198 10/11/2014 > 10/24/2014  196>   01/02/2015 193      HEENT: nl dentition, turbinates, and orophanx. Nl external ear canals without cough reflex, poor dentition    NECK :  without JVD/Nodes/TM/ nl carotid upstrokes bilaterally   LUNGS:  no acc muscle use, hyperresonant to percussion,  Distant BS w/ no wheezing    CV:  RRR  no s3 or murmur or increase in P2, no edema   ABD:  soft and nontender with nl excursion in the supine position. No bruits or organomegaly, bowel sounds nl  MS:  Nl gait, warm without deformities, nl gait, no calf tenderness, cyanosis or clubbing       CXR  07/25/14  Stable peribronchial thickening compatible with bronchitis.  Bibasilar atelectasis.       Assessment & Plan:

## 2015-01-16 NOTE — Patient Instructions (Signed)
Follow up in 6 weeks with Dr. Melvyn Novas and As needed   Bring Drug  Formulary- this is very important to bring back to our office.  Follow med calendar closely and bring to each visit.  Please contact office for sooner follow up if symptoms do not improve or worsen or seek emergency care

## 2015-01-16 NOTE — Addendum Note (Signed)
Addended by: Parke Poisson E on: 01/16/2015 04:13 PM   Modules accepted: Orders

## 2015-01-16 NOTE — Assessment & Plan Note (Addendum)
Compensated on present regimen Benicar is not covered on his formulary. Patient is to bring back a formulary to our office Advised on a low salt diet  Plan  Follow up in 6 weeks with Dr. Melvyn Novas and As needed   Bring Drug  Formulary- this is very important to bring back to our office.  Follow med calendar closely and bring to each visit.  Please contact office for sooner follow up if symptoms do not improve or worsen or seek emergency care

## 2015-01-23 ENCOUNTER — Encounter: Payer: Self-pay | Admitting: Internal Medicine

## 2015-01-23 ENCOUNTER — Telehealth: Payer: Self-pay | Admitting: Internal Medicine

## 2015-01-23 DIAGNOSIS — R1310 Dysphagia, unspecified: Secondary | ICD-10-CM

## 2015-01-23 DIAGNOSIS — K219 Gastro-esophageal reflux disease without esophagitis: Secondary | ICD-10-CM

## 2015-01-23 DIAGNOSIS — R1319 Other dysphagia: Secondary | ICD-10-CM

## 2015-01-23 NOTE — Telephone Encounter (Signed)
Spoke with pt.  States his is having a difficult time digesting food and dysphagia.  Would like an appt with GI ASAP.  Pt has a pending appt with GI in March but doesn't feel like he can wait this long.  He would like our office to make a referral to try to get a sooner appt.  Spoke with Tammy P, NP - ok to proceed with GI referral for above.  Referral placed.  Pt aware PCCs will be contacting with further information regarding appt.  He verbalized understanding and voiced no further questions or concerns at this time.

## 2015-01-27 NOTE — Addendum Note (Signed)
Addended by: Parke Poisson E on: 01/27/2015 01:40 PM   Modules accepted: Orders, Medications

## 2015-01-31 ENCOUNTER — Encounter: Payer: Self-pay | Admitting: Physician Assistant

## 2015-01-31 ENCOUNTER — Encounter (HOSPITAL_COMMUNITY): Payer: Self-pay | Admitting: *Deleted

## 2015-01-31 ENCOUNTER — Ambulatory Visit (INDEPENDENT_AMBULATORY_CARE_PROVIDER_SITE_OTHER): Payer: Medicare Other | Admitting: Physician Assistant

## 2015-01-31 VITALS — BP 140/80 | HR 71 | Ht 68.0 in | Wt 195.6 lb

## 2015-01-31 DIAGNOSIS — Z86010 Personal history of colonic polyps: Secondary | ICD-10-CM

## 2015-01-31 DIAGNOSIS — K219 Gastro-esophageal reflux disease without esophagitis: Secondary | ICD-10-CM | POA: Diagnosis not present

## 2015-01-31 DIAGNOSIS — R1314 Dysphagia, pharyngoesophageal phase: Secondary | ICD-10-CM | POA: Diagnosis not present

## 2015-01-31 MED ORDER — PANTOPRAZOLE SODIUM 40 MG PO TBEC: 40.0000 mg | DELAYED_RELEASE_TABLET | Freq: Every day | ORAL | Status: DC

## 2015-01-31 NOTE — Patient Instructions (Signed)
You have been scheduled for an endoscopy. Please follow written instructions given to you at your visit today. If you use inhalers (even only as needed), please bring them with you on the day of your procedure. Your physician has requested that you go to www.startemmi.com and enter the access code given to you at your visit today. This web site gives a general overview about your procedure. However, you should still follow specific instructions given to you by our office regarding your preparation for the procedure. We have sent the following medications to your pharmacy for you to pick up at your convenience: Pantoprazole  Gastroesophageal Reflux Disease, Adult Gastroesophageal reflux disease (GERD) happens when acid from your stomach flows up into the esophagus. When acid comes in contact with the esophagus, the acid causes soreness (inflammation) in the esophagus. Over time, GERD may create small holes (ulcers) in the lining of the esophagus. CAUSES   Increased body weight. This puts pressure on the stomach, making acid rise from the stomach into the esophagus.  Smoking. This increases acid production in the stomach.  Drinking alcohol. This causes decreased pressure in the lower esophageal sphincter (valve or ring of muscle between the esophagus and stomach), allowing acid from the stomach into the esophagus.  Late evening meals and a full stomach. This increases pressure and acid production in the stomach.  A malformed lower esophageal sphincter. Sometimes, no cause is found. SYMPTOMS   Burning pain in the lower part of the mid-chest behind the breastbone and in the mid-stomach area. This may occur twice a week or more often.  Trouble swallowing.  Sore throat.  Dry cough.  Asthma-like symptoms including chest tightness, shortness of breath, or wheezing. DIAGNOSIS  Your caregiver may be able to diagnose GERD based on your symptoms. In some cases, X-rays and other tests may be done  to check for complications or to check the condition of your stomach and esophagus. TREATMENT  Your caregiver may recommend over-the-counter or prescription medicines to help decrease acid production. Ask your caregiver before starting or adding any new medicines.  HOME CARE INSTRUCTIONS   Change the factors that you can control. Ask your caregiver for guidance concerning weight loss, quitting smoking, and alcohol consumption.  Avoid foods and drinks that make your symptoms worse, such as:  Caffeine or alcoholic drinks.  Chocolate.  Peppermint or mint flavorings.  Garlic and onions.  Spicy foods.  Citrus fruits, such as oranges, lemons, or limes.  Tomato-based foods such as sauce, chili, salsa, and pizza.  Fried and fatty foods.  Avoid lying down for the 3 hours prior to your bedtime or prior to taking a nap.  Eat small, frequent meals instead of large meals.  Wear loose-fitting clothing. Do not wear anything tight around your waist that causes pressure on your stomach.  Raise the head of your bed 6 to 8 inches with wood blocks to help you sleep. Extra pillows will not help.  Only take over-the-counter or prescription medicines for pain, discomfort, or fever as directed by your caregiver.  Do not take aspirin, ibuprofen, or other nonsteroidal anti-inflammatory drugs (NSAIDs). SEEK IMMEDIATE MEDICAL CARE IF:   You have pain in your arms, neck, jaw, teeth, or back.  Your pain increases or changes in intensity or duration.  You develop nausea, vomiting, or sweating (diaphoresis).  You develop shortness of breath, or you faint.  Your vomit is green, yellow, black, or looks like coffee grounds or blood.  Your stool is red, bloody, or  black. These symptoms could be signs of other problems, such as heart disease, gastric bleeding, or esophageal bleeding. MAKE SURE YOU:   Understand these instructions.  Will watch your condition.  Will get help right away if you are  not doing well or get worse. Document Released: 09/18/2005 Document Revised: 03/02/2012 Document Reviewed: 06/28/2011 [facility] Patient Information 2015 [place], [place]. This information is not intended to replace advice given to you by your health care provider. Make sure you discuss any questions you have with your health care provider.

## 2015-01-31 NOTE — Progress Notes (Signed)
Patient ID: [name], male   DOB: [birth year], 74 y.o.   MRN: [id number]     History of Present Illness:   [name] is a 74 year old African American male with a long-standing history of chronic acid reflux and a large hiatal hernia. He was previously followed by Dr. Sharlett Iles and was last seen in December 2074 he has had multiple EGDs several of which had dilation of esophageal strictures. A because he has been having nocturnal regurgitation every night for several months. He also has a burning in the esophagus. He has had multiple episodes of dysphasia to solids. He has not had to spit his food out, but has to stop eating and let it pass. He had previously been well maintained on Prevacid, but because he was feeling well he discontinued his Prevacid shortly after his last visit with Dr. Sharlett Iles. He occasionally uses it on an as-needed basis perhaps once or twice per month. He has no epigastric pain, nausea, or vomiting. His appetites been good and his weight has been stable. He's had no bloody or tarry stools. Review of his record shows he had multiple polyps removed by Dr. Oletta Lamas in 2010 per Dr. Buel Ream last note in December 2014, [name] will be due for surveillance colonoscopy in May 2016. The patient also has a history of severe chronic asthma with his best FEV1 around 2.2 L. He has a history of high blood pressure and hyperlipidemia lipidemia as well. He has had allergy shots for years which he completed approximately 2 years ago. He does have occasional dyspnea in hot weather. He uses a rescue inhaler daily. He does have a flutter device that he uses sometimes at night as well.   Past Medical History  Diagnosis Date  . Unspecified asthma(493.90)   . Dysphagia   . Cataract   . Acid reflux   . Borderline hypertension   . Shingles   . Urinary frequency   . Shoulder pain, right   . Personal history of colonic polyps 12/1989  . Hiatal hernia     large  . Esophageal stricture    . GERD (gastroesophageal reflux disease)   . Asthma 06/18/2011  . Colon polyps 2011    1 Hyperplastic and 1 Tubular Adenoma     Past Surgical History  Procedure Laterality Date  . Hernia repair    . Hemorrhoid surgery    . Tonsillectomy     Family History  Problem Relation Age of Onset  . Heart disease Mother   . Kidney failure Sister   . Breast cancer Sister   . Lung cancer Brother   . Brain cancer Sister   . Asthma Sister   . Colon cancer Neg Hx    History  Substance Use Topics  . Smoking status: Never Smoker   . Smokeless tobacco: Never Used  . Alcohol Use: No   Current Outpatient Prescriptions  Medication Sig Dispense Refill  . albuterol (PROAIR HFA) 108 (90 BASE) MCG/ACT inhaler Inhale 2 puffs into the lungs every 4 (four) hours as needed for wheezing or shortness of breath (((PLAN B))). 18 g 5  . albuterol (PROVENTIL) (2.5 MG/3ML) 0.083% nebulizer solution Take 3 mLs (2.5 mg total) by nebulization every 4 (four) hours as needed for wheezing or shortness of breath. 1 treatment every 4 hours as needed for wheezing ((PLAN C)) 75 mL 11  . aspirin 81 MG EC tablet Take 81 mg by mouth daily.      . famotidine (PEPCID) 20 MG  tablet Take 1 tablet (20 mg total) by mouth at bedtime.    Marland Kitchen guaifenesin (MUCUS RELIEF) 400 MG TABS 2-3 every 12 hours as needed for cough    . mometasone (NASONEX) 50 MCG/ACT nasal spray Place 1 spray into the nose 2 (two) times daily. 7.5 g 0  . mometasone-formoterol (DULERA) 200-5 MCG/ACT AERO Inhale 2 puffs into the lungs 2 (two) times daily. 1 Inhaler 0  . Multiple Vitamin (MULTIVITAMIN) tablet Take 1 tablet by mouth daily.    . Naproxen Sodium (ALEVE) 220 MG CAPS Per bottle as needed for joint pain    . olmesartan (BENICAR) 40 MG tablet Take 1/2 tablet daily    . oxymetazoline (AFRIN) 0.05 % nasal spray 2 puffs twice daily x 5 days, off x 2 weeks    . promethazine-codeine (PHENERGAN WITH CODEINE) 6.25-10 MG/5ML syrup Take 5 mLs by mouth every 6 (six)  hours as needed for cough. 240 mL 0  . Respiratory Therapy Supplies (FLUTTER) DEVI Use every 4 hours as needed    . Simethicone (GAS-X EXTRA STRENGTH) 125 MG CAPS Per box as needed    . olmesartan (BENICAR) 40 MG tablet Take 0.5 tablets (20 mg total) by mouth daily. 14 tablet 0  . pantoprazole (PROTONIX) 40 MG tablet Take 1 tablet (40 mg total) by mouth daily. 90 tablet 3   No current facility-administered medications for this visit.   No Known Allergies    Review of Systems: Gen: Denies any fever, chills, sweats, anorexia, fatigue, weakness, malaise, weight loss, and sleep disorder CV: Denies chest pain, angina, palpitations, syncope, orthopnea, PND, peripheral edema, and claudication. Resp: Denies dyspnea at rest, dyspnea with exercise, cough, sputum, wheezing, coughing up blood, and pleurisy. GI: Denies vomiting blood, jaundice, and fecal incontinence.   Denies dysphagia or odynophagia. GU : Denies urinary burning, blood in urine, urinary frequency, urinary hesitancy, nocturnal urination, and urinary incontinence. MS: Denies joint pain, limitation of movement, and swelling, stiffness, low back pain, extremity pain. Denies muscle weakness, cramps, atrophy.  Derm: Denies rash, itching, dry skin, hives, moles, warts, or unhealing ulcers.  Psych: Denies depression, anxiety, memory loss, suicidal ideation, hallucinations, paranoia, and confusion. Heme: Denies bruising, bleeding, and enlarged lymph nodes. Neuro:  Denies any headaches, dizziness, paresthesia Endo:  Denies any problems with DM, thyroid, adrenal    Physical Exam: General: Pleasant, well developed male in no acute distress Head: Normocephalic and atraumatic Eyes:  sclerae anicteric, conjunctiva pink  Ears: Normal auditory acuity Lungs: Clear throughout to auscultation Heart: Regular rate and rhythm Abdomen: Soft, non distended, non-tender. No masses, no hepatomegaly. Normal bowel sound Musculoskeletal: Symmetrical with no  gross deformities  Extremities: No edema  Neurological: Alert oriented x 4, grossly nonfocal Psychological:  Alert and cooperative. Normal mood and affect  Assessment and Recommendations: #1. GERD and dysphagia. An antireflux regimen has been reviewed at length. He will be given a trial of pantoprazole 40 mg by mouth every morning 30 minutes prior to breakfast. He will be scheduled for an EGD with possible dilation.The risks, benefits, and alternatives to endoscopy with possible biopsy and possible dilation were discussed with the patient and they consent to proceed. The procedure will be scheduled at the hospital due to the patient's respiratory history. The procedure will be scheduled with Dr. Carlean Purl. Further recommendations will be made pending the findings of the above.  #2. His tree of colon polyps. Patient is due for surveillance in May 2016.    Ryn Peine, Vita Barley PA-C 01/31/2015,

## 2015-02-07 ENCOUNTER — Encounter (HOSPITAL_COMMUNITY)
Admission: RE | Disposition: A | Discharge status: Home / Self Care | Payer: Self-pay | Source: Ambulatory Visit | Attending: Internal Medicine

## 2015-02-07 ENCOUNTER — Ambulatory Visit (HOSPITAL_COMMUNITY)
Admission: RE | Admit: 2015-02-07 | Discharge: 2015-02-07 | Disposition: A | Discharge status: Home / Self Care | Payer: Medicare Other | Source: Ambulatory Visit | Attending: Internal Medicine | Admitting: Internal Medicine

## 2015-02-07 ENCOUNTER — Ambulatory Visit (HOSPITAL_COMMUNITY): Payer: Medicare Other | Admitting: Anesthesiology

## 2015-02-07 ENCOUNTER — Encounter (HOSPITAL_COMMUNITY): Payer: Self-pay | Admitting: *Deleted

## 2015-02-07 DIAGNOSIS — Z7982 Long term (current) use of aspirin: Secondary | ICD-10-CM | POA: Diagnosis not present

## 2015-02-07 DIAGNOSIS — R1310 Dysphagia, unspecified: Secondary | ICD-10-CM | POA: Diagnosis not present

## 2015-02-07 DIAGNOSIS — Z86010 Personal history of colonic polyps: Secondary | ICD-10-CM | POA: Insufficient documentation

## 2015-02-07 DIAGNOSIS — J45909 Unspecified asthma, uncomplicated: Secondary | ICD-10-CM | POA: Diagnosis not present

## 2015-02-07 DIAGNOSIS — I10 Essential (primary) hypertension: Secondary | ICD-10-CM | POA: Diagnosis not present

## 2015-02-07 DIAGNOSIS — K222 Esophageal obstruction: Secondary | ICD-10-CM | POA: Insufficient documentation

## 2015-02-07 DIAGNOSIS — Z79899 Other long term (current) drug therapy: Secondary | ICD-10-CM | POA: Diagnosis not present

## 2015-02-07 DIAGNOSIS — K449 Diaphragmatic hernia without obstruction or gangrene: Secondary | ICD-10-CM | POA: Diagnosis not present

## 2015-02-07 DIAGNOSIS — K219 Gastro-esophageal reflux disease without esophagitis: Secondary | ICD-10-CM | POA: Insufficient documentation

## 2015-02-07 DIAGNOSIS — R1314 Dysphagia, pharyngoesophageal phase: Secondary | ICD-10-CM | POA: Diagnosis not present

## 2015-02-07 DIAGNOSIS — E785 Hyperlipidemia, unspecified: Secondary | ICD-10-CM | POA: Insufficient documentation

## 2015-02-07 HISTORY — PX: BALLOON DILATION: SHX5330

## 2015-02-07 HISTORY — PX: ESOPHAGOGASTRODUODENOSCOPY (EGD) WITH PROPOFOL: SHX5813

## 2015-02-07 SURGERY — ESOPHAGOGASTRODUODENOSCOPY (EGD) WITH PROPOFOL
Anesthesia: Monitor Anesthesia Care

## 2015-02-07 MED ORDER — PROPOFOL INFUSION 10 MG/ML OPTIME
INTRAVENOUS | Status: DC | PRN
  Administered 2015-02-07: 200 ug/kg/min via INTRAVENOUS

## 2015-02-07 MED ORDER — PROPOFOL 10 MG/ML IV BOLUS
INTRAVENOUS | Status: AC
Start: 2015-02-07 — End: 2015-02-07
  Filled 2015-02-07: qty 20

## 2015-02-07 MED ORDER — LACTATED RINGERS IV SOLN
INTRAVENOUS | Status: DC
  Administered 2015-02-07: 1000 mL via INTRAVENOUS

## 2015-02-07 MED ORDER — PROPOFOL 10 MG/ML IV BOLUS
INTRAVENOUS | Status: AC
  Filled 2015-02-07: qty 20

## 2015-02-07 MED ORDER — BUTAMBEN-TETRACAINE-BENZOCAINE 2-2-14 % EX AERO
INHALATION_SPRAY | CUTANEOUS | Status: DC | PRN
  Administered 2015-02-07: 2 via TOPICAL

## 2015-02-07 MED ORDER — SODIUM CHLORIDE 0.9 % IV SOLN: INTRAVENOUS | Status: DC

## 2015-02-07 SURGICAL SUPPLY — 14 items

## 2015-02-07 NOTE — Interval H&P Note (Signed)
History and Physical Interval Note:  02/07/2015 1:17 PM  [name]  has presented today for surgery, with the diagnosis of gerd, dysphagia  The various methods of treatment have been discussed with the patient and family. After consideration of risks, benefits and other options for treatment, the patient has consented to  Procedure(s): ESOPHAGOGASTRODUODENOSCOPY (EGD) WITH PROPOFOL (N/A) BALLOON DILATION (N/A) as a surgical intervention .  The patient's history has been reviewed, patient examined, no change in status, stable for surgery.  I have reviewed the patient's chart and labs.  Questions were answered to the patient's satisfaction.     Silvano Rusk

## 2015-02-07 NOTE — Anesthesia Preprocedure Evaluation (Addendum)
Anesthesia Evaluation  Patient identified by MRN, date of birth, ID band Patient awake    Reviewed: Allergy & Precautions, H&P , NPO status , Patient's Chart, lab work & pertinent test results  Airway Mallampati: II  TM Distance: >3 FB Neck ROM: full    Dental  (+) Poor Dentition, Missing, Dental Advisory Given Many missing front teeth:   Pulmonary asthma ,  Severe asthma breath sounds clear to auscultation  Pulmonary exam normal       Cardiovascular Exercise Tolerance: Good hypertension, Pt. on medications Rhythm:regular Rate:Normal     Neuro/Psych negative neurological ROS  negative psych ROS   GI/Hepatic negative GI ROS, Neg liver ROS, GERD-  Medicated and Controlled,  Endo/Other  negative endocrine ROS  Renal/GU negative Renal ROS  negative genitourinary   Musculoskeletal   Abdominal   Peds  Hematology negative hematology ROS (+)   Anesthesia Other Findings   Reproductive/Obstetrics negative OB ROS                            Anesthesia Physical Anesthesia Plan  ASA: III  Anesthesia Plan: MAC   Post-op Pain Management:    Induction:   Airway Management Planned:   Additional Equipment:   Intra-op Plan:   Post-operative Plan:   Informed Consent: I have reviewed the patients History and Physical, chart, labs and discussed the procedure including the risks, benefits and alternatives for the proposed anesthesia with the patient or authorized representative who has indicated his/her understanding and acceptance.   Dental Advisory Given  Plan Discussed with: CRNA and Surgeon  Anesthesia Plan Comments:         Anesthesia Quick Evaluation

## 2015-02-07 NOTE — Anesthesia Postprocedure Evaluation (Signed)
  Anesthesia Post-op Note  Patient: [name]  Procedure(s) Performed: Procedure(s) (LRB): ESOPHAGOGASTRODUODENOSCOPY (EGD) WITH PROPOFOL (N/A) BALLOON DILATION (N/A)  Patient Location: PACU  Anesthesia Type: MAC  Level of Consciousness: awake and alert   Airway and Oxygen Therapy: Patient Spontanous Breathing  Post-op Pain: mild  Post-op Assessment: Post-op Vital signs reviewed, Patient's Cardiovascular Status Stable, Respiratory Function Stable, Patent Airway and No signs of Nausea or vomiting  Last Vitals:  Filed Vitals:   02/07/15 1359  BP: 143/92  Pulse: 72  Temp:   Resp: 12    Post-op Vital Signs: stable   Complications: No apparent anesthesia complications

## 2015-02-07 NOTE — H&P (View-Only) (Signed)
Patient ID: [name], male   DOB: 05/17/1941, 74 y.o.   MRN: [id number]     History of Present Illness:   [name] is a 74 year old African American male with a long-standing history of chronic acid reflux and a large hiatal hernia. He was previously followed by Dr. Sharlett Iles and was last seen in December 2014 he has had multiple EGDs several of which had dilation of esophageal strictures. A because he has been having nocturnal regurgitation every night for several months. He also has a burning in the esophagus. He has had multiple episodes of dysphasia to solids. He has not had to spit his food out, but has to stop eating and let it pass. He had previously been well maintained on Prevacid, but because he was feeling well he discontinued his Prevacid shortly after his last visit with Dr. Sharlett Iles. He occasionally uses it on an as-needed basis perhaps once or twice per month. He has no epigastric pain, nausea, or vomiting. His appetites been good and his weight has been stable. He's had no bloody or tarry stools. Review of his record shows he had multiple polyps removed by Dr. Oletta Lamas in 2010 per Dr. Buel Ream last note in December 2014, [name] will be due for surveillance colonoscopy in May 2016. The patient also has a history of severe chronic asthma with his best FEV1 around 2.2 L. He has a history of high blood pressure and hyperlipidemia lipidemia as well. He has had allergy shots for years which he completed approximately 2 years ago. He does have occasional dyspnea in hot weather. He uses a rescue inhaler daily. He does have a flutter device that he uses sometimes at night as well.   Past Medical History  Diagnosis Date  . Unspecified asthma(493.90)   . Dysphagia   . Cataract   . Acid reflux   . Borderline hypertension   . Shingles   . Urinary frequency   . Shoulder pain, right   . Personal history of colonic polyps 12/1989  . Hiatal hernia     large  . Esophageal stricture    . GERD (gastroesophageal reflux disease)   . Asthma 06/18/2011  . Colon polyps 2011    1 Hyperplastic and 1 Tubular Adenoma     Past Surgical History  Procedure Laterality Date  . Hernia repair    . Hemorrhoid surgery    . Tonsillectomy     Family History  Problem Relation Age of Onset  . Heart disease Mother   . Kidney failure Sister   . Breast cancer Sister   . Lung cancer Brother   . Brain cancer Sister   . Asthma Sister   . Colon cancer Neg Hx    History  Substance Use Topics  . Smoking status: Never Smoker   . Smokeless tobacco: Never Used  . Alcohol Use: No   Current Outpatient Prescriptions  Medication Sig Dispense Refill  . albuterol (PROAIR HFA) 108 (90 BASE) MCG/ACT inhaler Inhale 2 puffs into the lungs every 4 (four) hours as needed for wheezing or shortness of breath (((PLAN B))). 18 g 5  . albuterol (PROVENTIL) (2.5 MG/3ML) 0.083% nebulizer solution Take 3 mLs (2.5 mg total) by nebulization every 4 (four) hours as needed for wheezing or shortness of breath. 1 treatment every 4 hours as needed for wheezing ((PLAN C)) 75 mL 11  . aspirin 81 MG EC tablet Take 81 mg by mouth daily.      . famotidine (PEPCID) 20 MG  tablet Take 1 tablet (20 mg total) by mouth at bedtime.    Marland Kitchen guaifenesin (MUCUS RELIEF) 400 MG TABS 2-3 every 12 hours as needed for cough    . mometasone (NASONEX) 50 MCG/ACT nasal spray Place 1 spray into the nose 2 (two) times daily. 7.5 g 0  . mometasone-formoterol (DULERA) 200-5 MCG/ACT AERO Inhale 2 puffs into the lungs 2 (two) times daily. 1 Inhaler 0  . Multiple Vitamin (MULTIVITAMIN) tablet Take 1 tablet by mouth daily.    . Naproxen Sodium (ALEVE) 220 MG CAPS Per bottle as needed for joint pain    . olmesartan (BENICAR) 40 MG tablet Take 1/2 tablet daily    . oxymetazoline (AFRIN) 0.05 % nasal spray 2 puffs twice daily x 5 days, off x 2 weeks    . promethazine-codeine (PHENERGAN WITH CODEINE) 6.25-10 MG/5ML syrup Take 5 mLs by mouth every 6 (six)  hours as needed for cough. 240 mL 0  . Respiratory Therapy Supplies (FLUTTER) DEVI Use every 4 hours as needed    . Simethicone (GAS-X EXTRA STRENGTH) 125 MG CAPS Per box as needed    . olmesartan (BENICAR) 40 MG tablet Take 0.5 tablets (20 mg total) by mouth daily. 14 tablet 0  . pantoprazole (PROTONIX) 40 MG tablet Take 1 tablet (40 mg total) by mouth daily. 90 tablet 3   No current facility-administered medications for this visit.   No Known Allergies    Review of Systems: Gen: Denies any fever, chills, sweats, anorexia, fatigue, weakness, malaise, weight loss, and sleep disorder CV: Denies chest pain, angina, palpitations, syncope, orthopnea, PND, peripheral edema, and claudication. Resp: Denies dyspnea at rest, dyspnea with exercise, cough, sputum, wheezing, coughing up blood, and pleurisy. GI: Denies vomiting blood, jaundice, and fecal incontinence.   Denies dysphagia or odynophagia. GU : Denies urinary burning, blood in urine, urinary frequency, urinary hesitancy, nocturnal urination, and urinary incontinence. MS: Denies joint pain, limitation of movement, and swelling, stiffness, low back pain, extremity pain. Denies muscle weakness, cramps, atrophy.  Derm: Denies rash, itching, dry skin, hives, moles, warts, or unhealing ulcers.  Psych: Denies depression, anxiety, memory loss, suicidal ideation, hallucinations, paranoia, and confusion. Heme: Denies bruising, bleeding, and enlarged lymph nodes. Neuro:  Denies any headaches, dizziness, paresthesia Endo:  Denies any problems with DM, thyroid, adrenal    Physical Exam: General: Pleasant, well developed male in no acute distress Head: Normocephalic and atraumatic Eyes:  sclerae anicteric, conjunctiva pink  Ears: Normal auditory acuity Lungs: Clear throughout to auscultation Heart: Regular rate and rhythm Abdomen: Soft, non distended, non-tender. No masses, no hepatomegaly. Normal bowel sound Musculoskeletal: Symmetrical with no  gross deformities  Extremities: No edema  Neurological: Alert oriented x 4, grossly nonfocal Psychological:  Alert and cooperative. Normal mood and affect  Assessment and Recommendations: #1. GERD and dysphagia. An antireflux regimen has been reviewed at length. He will be given a trial of pantoprazole 40 mg by mouth every morning 30 minutes prior to breakfast. He will be scheduled for an EGD with possible dilation.The risks, benefits, and alternatives to endoscopy with possible biopsy and possible dilation were discussed with the patient and they consent to proceed. The procedure will be scheduled at the hospital due to the patient's respiratory history. The procedure will be scheduled with Dr. Carlean Purl. Further recommendations will be made pending the findings of the above.  #2. His tree of colon polyps. Patient is due for surveillance in May 2016.    Jameca Chumley, Vita Barley PA-C 01/31/2015,

## 2015-02-07 NOTE — Discharge Instructions (Signed)
° °  I dilated a narrow area of the esophagus called a stricture. This should improve the swallowing problems. If not - call me back.  I appreciate the opportunity to care for you. Gatha Mayer, MD, FACG  YOU HAD AN ENDOSCOPIC PROCEDURE TODAY: Refer to the procedure report and other information in the discharge instructions given to you for any specific questions about what was found during the examination. If this information does not answer your questions, please call Dr. Celesta Aver office at [phone] to clarify.   YOU SHOULD EXPECT: Some feelings of bloating in the abdomen. Passage of more gas than usual. Walking can help get rid of the air that was put into your GI tract during the procedure and reduce the bloating. If you had a lower endoscopy (such as a colonoscopy or flexible sigmoidoscopy) you may notice spotting of blood in your stool or on the toilet paper. Some abdominal soreness may be present for a day or two, also.  DIET:  Clear liquids only until 330 PM then soft foods today. Try normal foods tomorrow.   Drink plenty of fluids but you should avoid alcoholic beverages for 24 hours.   ACTIVITY: Your care partner should take you home directly after the procedure. You should plan to take it easy, moving slowly for the rest of the day. You can resume normal activity the day after the procedure however YOU SHOULD NOT DRIVE, use power tools, machinery or perform tasks that involve climbing or major physical exertion for 24 hours (because of the sedation medicines used during the test).   SYMPTOMS TO REPORT IMMEDIATELY: A gastroenterologist can be reached at any hour. Please call [phone]  for any of the following symptoms:  Following upper endoscopy (EGD, EUS, ERCP, esophageal dilation) Vomiting of blood or coffee ground material  New, significant abdominal pain  New, significant chest pain or pain under the shoulder blades  Painful or persistently difficult swallowing    New shortness of breath  Black, tarry-looking or red, bloody stools

## 2015-02-07 NOTE — Transfer of Care (Signed)
Immediate Anesthesia Transfer of Care Note  Patient: [name]  Procedure(s) Performed: Procedure(s): ESOPHAGOGASTRODUODENOSCOPY (EGD) WITH PROPOFOL (N/A) BALLOON DILATION (N/A)  Patient Location: PACU  Anesthesia Type:MAC  Level of Consciousness: sedated  Airway & Oxygen Therapy: Patient Spontanous Breathing and Patient connected to face mask oxygen  Post-op Assessment: Report given to RN and Post -op Vital signs reviewed and stable  Post vital signs: Reviewed and stable  Last Vitals:  Filed Vitals:   02/07/15 1345  BP: 174/111  Temp:   Resp: 16    Complications: No apparent anesthesia complications

## 2015-02-07 NOTE — Op Note (Signed)
[facility] [place], [address]   ENDOSCOPY PROCEDURE REPORT  PATIENT: Hector, Jacobson  MR#: [id number] BIRTHDATE: Mar 24, 1941 , 73  yrs. old GENDER: male ENDOSCOPIST: Gatha Mayer, MD, [facility] PROCEDURE DATE:  02/07/2015 PROCEDURE:  Esophagoscopy ASA CLASS:     Class III INDICATIONS:  therapeutic procedure and dilate esophagus for dysphagia. MEDICATIONS: Monitored anesthesia care and Per Anesthesia TOPICAL ANESTHETIC: none  DESCRIPTION OF PROCEDURE: After the risks benefits and alternatives of the procedure were thoroughly explained, informed consent was obtained.  The    endoscope was introduced through the mouth and advanced to the stomach antrum , Without limitations.  The instrument was slowly withdrawn as the mucosa was fully examined.    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  Dilated to 18 and 19 mm with balloon - good result with tear and small heme seen.  2) 3-4 cm hiatal hernia 3) Otherwise normal esophagus and stomach. Retroflexed views revealed a hiatal hernia.     The scope was then withdrawn from the patient and the procedure completed.  COMPLICATIONS: There were no immediate complications.  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  Dilated to 18 and 19 mm with balloon - good result with tear and small heme seen. 2) 3-4 cm hiatal hernia 3) Otherwise normal esophagus and stomach  RECOMMENDATIONS: 1.  Clear liquids until 330 PM  , then soft foods rest of day. Resume prior diet tomorrow. 2.  Call back if persistent dysphagia 3.  Stay on PPI   eSigned:  Gatha Mayer, MD, [facility] 02/07/2015 2:07 PM    CC:The Patient

## 2015-02-08 ENCOUNTER — Encounter (HOSPITAL_COMMUNITY): Payer: Self-pay | Admitting: Internal Medicine

## 2015-02-09 NOTE — Progress Notes (Signed)
Agree w/ Ms. Hvozdovic's note and mangement.  

## 2015-02-17 DIAGNOSIS — H40029 Open angle with borderline findings, high risk, unspecified eye: Secondary | ICD-10-CM | POA: Diagnosis not present

## 2015-02-17 DIAGNOSIS — H35033 Hypertensive retinopathy, bilateral: Secondary | ICD-10-CM | POA: Diagnosis not present

## 2015-02-17 DIAGNOSIS — H524 Presbyopia: Secondary | ICD-10-CM | POA: Diagnosis not present

## 2015-02-17 DIAGNOSIS — Z961 Presence of intraocular lens: Secondary | ICD-10-CM | POA: Diagnosis not present

## 2015-02-27 ENCOUNTER — Ambulatory Visit: Payer: Medicare Other | Admitting: Internal Medicine

## 2015-03-06 ENCOUNTER — Encounter: Payer: Self-pay | Admitting: Internal Medicine

## 2015-03-06 ENCOUNTER — Ambulatory Visit (INDEPENDENT_AMBULATORY_CARE_PROVIDER_SITE_OTHER): Payer: Medicare Other | Admitting: Internal Medicine

## 2015-03-06 VITALS — BP 118/70 | HR 73 | Ht 68.0 in | Wt 198.0 lb

## 2015-03-06 DIAGNOSIS — J4550 Severe persistent asthma, uncomplicated: Secondary | ICD-10-CM

## 2015-03-06 NOTE — Patient Instructions (Addendum)
See calendar for specific medication instructions and bring it back for each and every office visit for every healthcare provider you see.  Without it,  you may not receive the best quality medical care that we feel you deserve.  You will note that the calendar groups together  your maintenance  medications that are timed at particular times of the day.  Think of this as your checklist for what your doctor has instructed you to do until your next evaluation to see what benefit  there is  to staying on a consistent group of medications intended to keep you well.  The other group at the bottom is entirely up to you to use as you see fit  for specific symptoms that may arise between visits that require you to treat them on an as needed basis.  Think of this as your action plan or "what if" list.   Separating the top medications from the bottom group is fundamental to providing you adequate care going forward.     Please schedule a follow up office visit in 4 weeks, sooner if needed to see Tammy with your list, your meds and your formulary  dulera 18 and has sample x 2 weeks so given another and should have count of 18 in return in one m

## 2015-03-06 NOTE — Progress Notes (Signed)
Subjective:   Patient ID: [name]    DOB: 1941-03-24      Brief patient profile: 44 yobm denies ever smoking but has lifelong asthma with severe chronic pattern and best FEV1 of around 2.2 liters documented 2002 followed in pulmonary for primary care also for hbp and hyperlipidemia.    HPI   07/30/13 Allergy eval rx completed x 4 years > d/c'd and f/u allergy prn    07/25/2014 f/u ov/Justine Cossin re: severe chronic asthma/hbp/gerd  Chief Complaint  Patient presents with  . Annual Exam    Pt is fasting. He states still having some increased wheezing and dyspnea since last visit- relates to hot weather. Using rescue inhaler daily.  Has used neb x 1 since last visit.   >>prevnar /TD vaccine   01/02/2015 f/u ov/Doranne Schmutz re: dtc asthma/ hbp/ still dep on samples and did not bring formulary  Chief Complaint  Patient presents with  . Follow-up    Pt states that he is doing well and denies any new co's today. He is using proair 1 to 2 x per wk on average.    >>no changes   01/16/2015 Follow up and Med Review : Asthma  Patient returns for a one-month follow-up and medication review We reviewed all his medications organize them into a medication calendar with patient education Unfortunately, patient did not bring his formulary and for his visit today We did contact his pharmacy and several of his medications are not covered. We did provide samples and encouraged him to bring back a formulary so we can pick a medication that is covered. He agreed to get a copy and bring back to office rec Follow up in 6 weeks with Dr. Melvyn Novas and As needed   Bring Drug  Formulary- this is very important to bring back to our office.  Follow med calendar closely and bring to each visit.    03/06/2015 f/u ov/Miaa Latterell re: chronic asthma/ non adherence / relies on samples/ no formulary / has med calendar  Chief Complaint  Patient presents with  . Follow-up    Pt says asthma has been well controlled. Pt has a little cough,  wheezing but after using inhaler it is corrected.  states using saba no more than twice weekly  No obvious day to day or daytime variabilty or assoc excess/purulent sputum  or cp or chest tightness, subjective wheeze overt sinus or hb symptoms. No unusual exp hx or h/o childhood pna/ asthma or knowledge of premature birth.  Sleeping ok without nocturnal  or early am exacerbation  of respiratory  c/o's or need for noct saba. Also denies any obvious fluctuation of symptoms with weather or environmental changes or other aggravating or alleviating factors except as outlined above   Current Medications, Allergies, Complete Past Medical History, Past Surgical History, Family History, and Social History were reviewed in [employer] record.  ROS  The following are not active complaints unless bolded sore throat, dysphagia, dental problems, itching, sneezing,  nasal congestion or excess/ purulent secretions, ear ache,   fever, chills, sweats, unintended wt loss, pleuritic or exertional cp, hemoptysis,  orthopnea pnd or leg swelling, presyncope, palpitations, heartburn, abdominal pain, anorexia, nausea, vomiting, diarrhea  or change in bowel or urinary habits, change in stools or urine, dysuria,hematuria,  rash, arthralgias, visual complaints, headache, numbness weakness or ataxia or problems with walking or coordination,  change in mood/affect or memory.  Past Medical Hx ASTHMA (ICD-493.90)  --chronic severe asthma with best FEV1 2.2 L documented 01/2001 and  documented non-adherence Mainly lives off samples of inhalers and nose sprays  - HFA 75% June 15, 2009 > 90% August 02, 2009 > 100% December 27, 2009  - Off allergy vaccines x7/2010 > highly allergic skin tests per Dr Annamaria Boots > restarted vaccine November 06, 2009 > stopped  07/2013  SHOULDER PAIN, RIGHT (ICD-719.41)   HYPERTENSION, BORDERLINE (ICD-401.9)  Shingles T 8 Left 9/01  HEALTH  MAINTENANCE...............................................................................................Marland KitchenWert  - Colonoscopy 12/1989 , 2011  - dT 07/25/2014  - Pneumovax 3/04 and 05/2009 - Prevnar 07/25/14 -Flu declined October 17, 2008 and September 21, 2009 , declines flu shot 2015  -CPX 07/25/2014  - Meds reviewed with pt education and computerized med calendar adjusted October 19, 2009, February 20, 2010, September 10, 2010 , 05/22/2012 , 03/01/2013, 10/26/2013 , 03/30/2014 . 07/04/2014 ,. 08/22/2014 , 01/16/2015            Objective:   Physical Exam    amb bm nad     05/23/2014  201> 201 07/04/2014 > 07/25/14  196>198 08/22/2014 >194 09/19/2014 >198 10/11/2014 > 10/24/2014  196>   01/02/2015 193 > 03/06/2015  198      HEENT: nl dentition, turbinates, and orophanx. Nl external ear canals without cough reflex, poor dentition    NECK :  without JVD/Nodes/TM/ nl carotid upstrokes bilaterally   LUNGS: no acc muscle use, hyperresonant to percussion,  Distant BS w/ no wheezing    CV:  RRR  no s3 or murmur or increase in P2, no edema   ABD:  soft and nontender with nl excursion in the supine position. No bruits or organomegaly, bowel sounds nl  MS:  Nl gait, warm without deformities, nl gait, no calf tenderness, cyanosis or clubbing       CXR  07/25/14  Stable peribronchial thickening compatible with bronchitis.  Bibasilar atelectasis.       Assessment & Plan:

## 2015-03-07 ENCOUNTER — Encounter: Payer: Self-pay | Admitting: Internal Medicine

## 2015-03-07 NOTE — Assessment & Plan Note (Signed)
--  chronic severe asthma with best FEV1 2.2 L documented 01/2001 and documented non-adherence Mainly lives off samples of inhalers and nose sprays  - Off allergy vaccines x7/2010 > highly allergic skin tests per Dr Annamaria Boots > restart vaccine November 06, 2009>stop 07/30/13 to watch - Spirometry on "good day" 01/14/12 =  FEV1 1.54  Ratio 0.58 - 05/23/14 p extensive coaching HFA effectiveness =    90% from baseline of 50%   I had an extended discussion with the patient reviewing all relevant studies completed to date and  lasting 15 to 20 minutes of a 25 minute visit on the following ongoing concerns:  1) he has not provided [place] a drug formulary and so is still sample dependent. Explained this is not appropriate and we need to work with his insurance restrictions to come up with an effective/ affordable combination of meds but he'll need to meet [place] half way  2) Each maintenance medication was reviewed in detail including most importantly the difference between maintenance and as needed and under what circumstances the prns are to be used. This was done in the context of a medication calendar review which provided the patient with a user-friendly unambiguous mechanism for medication administration and reconciliation and provides an action plan for all active problems. It is critical that this be shown to every doctor  for modification during the office visit if necessary so the patient can use it as a working document.    3) samples provided for 1 m contingent on him returning with formular and we need to keep count of  the dulera to be sure he is using it consistently  4) See instructions for specific recommendations which were reviewed directly with the patient who was given a copy with highlighter outlining the key components.

## 2015-03-13 ENCOUNTER — Ambulatory Visit: Payer: Medicare Other | Admitting: Internal Medicine

## 2015-04-03 ENCOUNTER — Encounter: Payer: Self-pay | Admitting: Adult Health

## 2015-04-03 ENCOUNTER — Ambulatory Visit (INDEPENDENT_AMBULATORY_CARE_PROVIDER_SITE_OTHER): Payer: Medicare Other | Admitting: Adult Health

## 2015-04-03 VITALS — BP 130/80 | HR 75 | Temp 97.5°F | Ht 68.0 in | Wt 198.6 lb

## 2015-04-03 DIAGNOSIS — I10 Essential (primary) hypertension: Secondary | ICD-10-CM

## 2015-04-03 DIAGNOSIS — K219 Gastro-esophageal reflux disease without esophagitis: Secondary | ICD-10-CM

## 2015-04-03 DIAGNOSIS — J4550 Severe persistent asthma, uncomplicated: Secondary | ICD-10-CM | POA: Diagnosis not present

## 2015-04-03 MED ORDER — MOMETASONE FURO-FORMOTEROL FUM 200-5 MCG/ACT IN AERO: 2.0000 | INHALATION_SPRAY | Freq: Two times a day (BID) | RESPIRATORY_TRACT | Status: DC

## 2015-04-03 NOTE — Progress Notes (Signed)
Subjective:   Patient ID: [name]    DOB: 1941/05/10      Brief patient profile: 66 yobm denies ever smoking but has lifelong asthma with severe chronic pattern and best FEV1 of around 2.2 liters documented 2002 followed in pulmonary for primary care also for hbp and hyperlipidemia.    HPI   07/30/13 Allergy eval rx completed x 4 years > d/c'd and f/u allergy prn    07/25/2014 f/u ov/Wert re: severe chronic asthma/hbp/gerd  Chief Complaint  Patient presents with  . Annual Exam    Pt is fasting. He states still having some increased wheezing and dyspnea since last visit- relates to hot weather. Using rescue inhaler daily.  Has used neb x 1 since last visit.   >>prevnar /TD vaccine   01/02/2015 f/u ov/Wert re: dtc asthma/ hbp/ still dep on samples and did not bring formulary  Chief Complaint  Patient presents with  . Follow-up    Pt states that he is doing well and denies any new co's today. He is using proair 1 to 2 x per wk on average.    >>no changes   01/16/2015 Follow up and Med Review : Asthma  Patient returns for a one-month follow-up and medication review We reviewed all his medications organize them into a medication calendar with patient education Unfortunately, patient did not bring his formulary and for his visit today We did contact his pharmacy and several of his medications are not covered. We did provide samples and encouraged him to bring back a formulary so we can pick a medication that is covered. He agreed to get a copy and bring back to office rec Follow up in 6 weeks with Dr. Melvyn Novas and As needed   Bring Drug  Formulary- this is very important to bring back to our office.  Follow med calendar closely and bring to each visit.    03/06/2015 f/u ov/Wert re: chronic asthma/ non adherence / relies on samples/ no formulary / has med calendar  Chief Complaint  Patient presents with  . Follow-up    Pt says asthma has been well controlled. Pt has a little cough,  wheezing but after using inhaler it is corrected.  states using saba no more than twice weekly >>no changes   04/03/2015 Follow up : chronic asthma/ HTN/non adherence / relies on samples/ no formulary / has med calendar  Pt returns for 1 month follow up .  Doing well with no asthma flare , on Dulera .  No increased SABA use.  Needs samples.  No flare of sinus drainage , on nasonex.  Immunization utd  Has colonoscopy next month.  We discussed a healthy low salt and cholesterol diet  Feels good overall.  B/p is ok on Benicar . Discussed wt loss .  No chest pain, orthopnea, edema , bloody stools, fever .  Discussed poor dentition , talked about establishing with dentisit.       Current Medications, Allergies, Complete Past Medical History, Past Surgical History, Family History, and Social History were reviewed in [employer] [facility] record.  ROS  The following are not active complaints unless bolded sore throat, dysphagia, dental problems, itching, sneezing,  nasal congestion or excess/ purulent secretions, ear ache,   fever, chills, sweats, unintended wt loss, pleuritic or exertional cp, hemoptysis,  orthopnea pnd or leg swelling, presyncope, palpitations, heartburn, abdominal pain, anorexia, nausea, vomiting, diarrhea  or change in bowel or urinary habits, change in stools or urine, dysuria,hematuria,  rash, arthralgias, visual complaints, headache, numbness weakness or ataxia or problems with walking or coordination,  change in mood/affect or memory.              Past Medical Hx ASTHMA (ICD-493.90)  --chronic severe asthma with best FEV1 2.2 L documented 01/2001 and  documented non-adherence Mainly lives off samples of inhalers and nose sprays  - HFA 75% June 15, 2009 > 90% August 02, 2009 > 100% December 27, 2009  - Off allergy vaccines x7/2010 > highly allergic skin tests per Dr Annamaria Boots > restarted vaccine November 06, 2009 > stopped  07/2013  SHOULDER PAIN, RIGHT  (ICD-719.41)   HYPERTENSION, BORDERLINE (ICD-401.9)  Shingles T 8 Left 9/01  HEALTH MAINTENANCE...............................................................................................Marland KitchenWert  - Colonoscopy 12/1989 , 2011  - dT 07/25/2014  - Pneumovax 3/04 and 05/2009 - Prevnar 07/25/14 -Flu declined October 17, 2008 and September 21, 2009 , declines flu shot 2015  -CPX 07/25/2014  - Meds reviewed with pt education and computerized med calendar adjusted October 19, 2009, February 20, 2010, September 10, 2010 , 05/22/2012 , 03/01/2013, 10/26/2013 , 03/30/2014 . 07/04/2014 ,. 08/22/2014 , 01/16/2015            Objective:   Physical Exam    amb bm nad     05/23/2014  201> 201 07/04/2014 > 07/25/14  196>198 08/22/2014 >194 09/19/2014 >198 10/11/2014 > 10/24/2014  196>   01/02/2015 193 > 03/06/2015  198 >198 04/03/2015      HEENT: nl dentition, turbinates, and orophanx. Nl external ear canals without cough reflex, poor dentition    NECK :  without JVD/Nodes/TM/ nl carotid upstrokes bilaterally   LUNGS: no acc muscle use, hyperresonant to percussion,  Distant BS w/ no wheezing    CV:  RRR  no s3 or murmur or increase in P2, no edema   ABD:  soft and nontender with nl excursion in the supine position. No bruits or organomegaly, bowel sounds nl  MS:  Nl gait, warm without deformities, nl gait, no calf tenderness, cyanosis or clubbing       CXR  07/25/14  Stable peribronchial thickening compatible with bronchitis.  Bibasilar atelectasis.       Assessment & Plan:

## 2015-04-03 NOTE — Assessment & Plan Note (Signed)
Controlled   Plan  ollow up in 6 -8 weeks with Dr. Melvyn Novas and As needed   Follow med calendar closely and bring to each visit.  Need to establish with dentist  Please contact office for sooner follow up if symptoms do not improve or worsen or seek emergency care

## 2015-04-03 NOTE — Patient Instructions (Addendum)
Follow up in 6 -8 weeks with Dr. Melvyn Novas and As needed   Follow med calendar closely and bring to each visit.  Low cholesterol and salt diet .  Exercise as able, weight loss.  Need to establish with dentist  Please contact office for sooner follow up if symptoms do not improve or worsen or seek emergency care

## 2015-04-03 NOTE — Assessment & Plan Note (Signed)
Compensated   Plan  ollow up in 6 -8 weeks with Dr. Melvyn Novas and As needed   Follow med calendar closely and bring to each visit.  Please contact office for sooner follow up if symptoms do not improve or worsen or seek emergency care

## 2015-04-03 NOTE — Assessment & Plan Note (Signed)
Controlled on rx   Plan  ollow up in 6 -8 weeks with Dr. Melvyn Novas and As needed   Follow med calendar closely and bring to each visit.  Low cholesterol and salt diet .  Exercise as able, weight loss.  Need to establish with dentist  Please contact office for sooner follow up if symptoms do not improve or worsen or seek emergency care

## 2015-04-20 DIAGNOSIS — H40023 Open angle with borderline findings, high risk, bilateral: Secondary | ICD-10-CM | POA: Diagnosis not present

## 2015-05-16 ENCOUNTER — Ambulatory Visit (INDEPENDENT_AMBULATORY_CARE_PROVIDER_SITE_OTHER): Payer: Medicare Other | Admitting: Internal Medicine

## 2015-05-16 ENCOUNTER — Encounter: Payer: Self-pay | Admitting: Internal Medicine

## 2015-05-16 VITALS — BP 140/84 | HR 74 | Ht 68.0 in | Wt 196.0 lb

## 2015-05-16 DIAGNOSIS — J4550 Severe persistent asthma, uncomplicated: Secondary | ICD-10-CM

## 2015-05-16 DIAGNOSIS — I10 Essential (primary) hypertension: Secondary | ICD-10-CM

## 2015-05-16 DIAGNOSIS — J302 Other seasonal allergic rhinitis: Secondary | ICD-10-CM

## 2015-05-16 DIAGNOSIS — J3089 Other allergic rhinitis: Secondary | ICD-10-CM

## 2015-05-16 DIAGNOSIS — E785 Hyperlipidemia, unspecified: Secondary | ICD-10-CM

## 2015-05-16 DIAGNOSIS — J309 Allergic rhinitis, unspecified: Secondary | ICD-10-CM | POA: Diagnosis not present

## 2015-05-16 NOTE — Progress Notes (Signed)
Subjective:   Patient ID: [name]    DOB: 12/12/1941      Brief patient profile: 74 yobm denies ever smoking but has lifelong asthma with severe chronic pattern and best FEV1 of around 2.2 liters documented 2002 followed in pulmonary for primary care also for hbp and hyperlipidemia.    HPI   07/30/13 Allergy eval rx completed x 4 years > d/c'd and f/u allergy prn    07/25/2014 f/u ov/Merrit Friesen re: severe chronic asthma/hbp/gerd  Chief Complaint  Patient presents with  . Annual Exam    Pt is fasting. He states still having some increased wheezing and dyspnea since last visit- relates to hot weather. Using rescue inhaler daily.  Has used neb x 1 since last visit.   >>prevnar /TD vaccine     04/03/2015 Follow up : chronic asthma/ HTN/non adherence / relies on samples/ no formulary / has med calendar  Pt returns for 1 month follow up .  Doing well with no asthma flare , on Dulera . Marland Kitchen  Need to establish with dentist > multiple teeth extracted   05/16/2015 f/u ov/Adarsh Mundorf re: hbp/ hyperlipidemia/ severe chronic asthma/ rhinitis using med calendar but still having hard time keeping up with meds  Chief Complaint  Patient presents with  . Follow-up    Pt states doing well and denies any co's. He states he seldom uses rescue inhaler/neb.     Insurance won't pay for nasonex/ informed flonased and nasacort aq otc and formulary issues (see a/p)  Not limited by breathing from desired activities  But no really very active  No obvious day to day or daytime variabilty or assoc chronic cough or cp or chest tightness, subjective wheeze overt sinus or hb symptoms. No unusual exp hx or h/o childhood pna/ asthma or knowledge of premature birth.  Sleeping ok without nocturnal  or early am exacerbation  of respiratory  c/o's or need for noct saba. Also denies any obvious fluctuation of symptoms with weather or environmental changes or other aggravating or alleviating factors except as outlined above    Current Medications, Allergies, Complete Past Medical History, Past Surgical History, Family History, and Social History were reviewed in [employer] record.  ROS  The following are not active complaints unless bolded sore throat, dysphagia, dental problems, itching, sneezing,  nasal congestion or excess/ purulent secretions, ear ache,   fever, chills, sweats, unintended wt loss, pleuritic or exertional cp, hemoptysis,  orthopnea pnd or leg swelling, presyncope, palpitations, heartburn, abdominal pain, anorexia, nausea, vomiting, diarrhea  or change in bowel or urinary habits, change in stools or urine, dysuria,hematuria,  rash, arthralgias, visual complaints, headache, numbness weakness or ataxia or problems with walking or coordination,  change in mood/affect or memory.                      Past Medical Hx ASTHMA (ICD-493.90)  --chronic severe asthma with best FEV1 2.2 L documented 01/2001 and  documented non-adherence Mainly lives off samples of inhalers and nose sprays  - HFA 75% June 15, 2009 > 90% August 02, 2009 > 100% December 27, 2009  - Off allergy vaccines x7/2010 > highly allergic skin tests per Dr Annamaria Boots > restarted vaccine November 06, 2009 > stopped  07/2013  SHOULDER PAIN, RIGHT (ICD-719.41)   HYPERTENSION, BORDERLINE (ICD-401.9)  Shingles T 8 Left 9/01  HEALTH MAINTENANCE...............................................................................................Marland KitchenWert  - Colonoscopy 12/1989 , 2011  - dT 07/25/2014  - Pneumovax 3/04 and 05/2009 - Prevnar 07/25/14 -  Flu declined October 17, 2008 and September 21, 2009 , declined flu shot 2015  -CPX 07/25/2014  - Meds reviewed with pt education and computerized med calendar adjusted October 19, 2009, February 20, 2010, September 10, 2010 , 05/22/2012 , 03/01/2013, 10/26/2013 , 03/30/2014 . 07/04/2014 ,. 08/22/2014 , 01/16/2015            Objective:   Physical Exam    amb bm nad / occ throat clearing      05/23/2014  201> 201 07/04/2014 > 07/25/14  196>198 08/22/2014 >194 09/19/2014 >198 10/11/2014 > 10/24/2014  196>   01/02/2015 193 > 03/06/2015  198 >198 04/03/2015 > 05/16/2015 196      HEENT: nl dentition, turbinates, and orophanx. Nl external ear canals without cough reflex, poor dentition    NECK :  without JVD/Nodes/TM/ nl carotid upstrokes bilaterally   LUNGS: no acc muscle use, hyperresonant to percussion,  Distant BS w/ no wheezing    CV:  RRR  no s3 or murmur or increase in P2, no edema   ABD:  soft and nontender with nl excursion in the supine position. No bruits or organomegaly, bowel sounds nl  MS:  Nl gait, warm without deformities, nl gait, no calf tenderness, cyanosis or clubbing       CXR  07/25/14  Stable peribronchial thickening compatible with bronchitis.  Bibasilar atelectasis.       Assessment & Plan:

## 2015-05-16 NOTE — Patient Instructions (Signed)
See calendar for specific medication instructions and bring it back for each and every office visit for every healthcare provider you see.  Without it,  you may not receive the best quality medical care that we feel you deserve. If new medications are added, add them to the list   You will note that the calendar groups together  your maintenance  medications that are timed at particular times of the day.  Think of this as your checklist for what your doctor has instructed you to do until your next evaluation to see what benefit  there is  to staying on a consistent group of medications intended to keep you well.  The other group at the bottom is entirely up to you to use as you see fit  for specific symptoms that may arise between visits that require you to treat them on an as needed basis.  Think of this as your action plan or "what if" list.   Separating the top medications from the bottom group is fundamental to providing you adequate care going forward.    Please schedule a follow up visit in 3 months but call sooner if needed to see [name]

## 2015-05-17 ENCOUNTER — Encounter: Payer: Self-pay | Admitting: Internal Medicine

## 2015-05-17 NOTE — Assessment & Plan Note (Signed)
Marginally Adequate control on present rx, reviewed > no change in rx needed

## 2015-05-17 NOTE — Assessment & Plan Note (Signed)
--  chronic severe asthma with best FEV1 2.2 L documented 01/2001 and documented non-adherence Mainly lives off samples of inhalers and nose sprays  - Off allergy vaccines x7/2010 > highly allergic skin tests per Dr Annamaria Boots > restart vaccine November 06, 2009>stop 07/30/13 to watch - Spirometry on "good day" 01/14/12 =  FEV1 1.54  Ratio 0.58 - 05/23/14 p extensive coaching HFA effectiveness =    90% from baseline of 50%    Remains well controlled but tenuous with major challenge keeping him on approp maint rx  I had an extended discussion with the patient reviewing all relevant studies completed to date and  lasting 15 to 20 minutes of a 25 minute visit on the following ongoing concerns:  1) Each maintenance medication was reviewed in detail including most importantly the difference between maintenance and as needed and under what circumstances the prns are to be used. This was done in the context of a medication calendar review which provided the patient with a user-friendly unambiguous mechanism for medication administration and reconciliation and provides an action plan for all active problems. It is critical that this be shown to every doctor  for modification during the office visit if necessary so the patient can use it as a working document.     2)  Formulary restrictions will be an ongoing challenge for the forseable future and I would be happy to pick an alternative if the pt will first  provide me a list of them but pt  will need to return here for training for any new device that is required eg dpi vs hfa vs respimat.    In meantime we can always provide samples so the patient never runs out of any needed respiratory medications.    3) See instructions for specific recommendations which were reviewed directly with the patient who was given a copy with highlighter outlining the key components.

## 2015-05-17 NOTE — Assessment & Plan Note (Signed)
-   Target LDL < 130 due to h/o hbp/ male gender  Lab Results  Component Value Date   CHOL 189 07/25/2014   HDL 41.90 07/25/2014   LDLCALC 113* 07/25/2014   LDLDIRECT 124.7 04/30/2013   TRIG 169.0* 07/25/2014   CHOLHDL 5 07/25/2014     Adequate control on present rx, reviewed > no change in rx needed

## 2015-05-17 NOTE — Assessment & Plan Note (Addendum)
Off allergy vaccines x7/2010 > highly allergic skin tests per Dr Annamaria Boots > restarted vaccine November 06, 2009  Allergy vaccine 1:50:000 10/30/09; adv to 1:10 11/20/10 GH. D/Cd 07/30/13  Mild flare off nasonex >>> Reviewed formulary restrictions / otc options

## 2015-05-30 ENCOUNTER — Encounter (HOSPITAL_COMMUNITY): Payer: Self-pay | Admitting: Emergency Medicine

## 2015-05-30 ENCOUNTER — Emergency Department (HOSPITAL_COMMUNITY): Payer: Medicare Other

## 2015-05-30 ENCOUNTER — Telehealth: Payer: Self-pay | Admitting: Internal Medicine

## 2015-05-30 ENCOUNTER — Emergency Department (HOSPITAL_COMMUNITY)
Admission: EM | Admit: 2015-05-30 | Discharge: 2015-05-30 | Disposition: A | Discharge status: Home / Self Care | Payer: Medicare Other | Attending: Emergency Medicine | Admitting: Emergency Medicine

## 2015-05-30 DIAGNOSIS — J984 Other disorders of lung: Secondary | ICD-10-CM | POA: Diagnosis not present

## 2015-05-30 DIAGNOSIS — R42 Dizziness and giddiness: Secondary | ICD-10-CM | POA: Diagnosis not present

## 2015-05-30 DIAGNOSIS — Z79899 Other long term (current) drug therapy: Secondary | ICD-10-CM | POA: Insufficient documentation

## 2015-05-30 DIAGNOSIS — I10 Essential (primary) hypertension: Secondary | ICD-10-CM | POA: Diagnosis not present

## 2015-05-30 DIAGNOSIS — Z8669 Personal history of other diseases of the nervous system and sense organs: Secondary | ICD-10-CM | POA: Diagnosis not present

## 2015-05-30 DIAGNOSIS — Z86010 Personal history of colonic polyps: Secondary | ICD-10-CM | POA: Diagnosis not present

## 2015-05-30 DIAGNOSIS — Z7982 Long term (current) use of aspirin: Secondary | ICD-10-CM | POA: Diagnosis not present

## 2015-05-30 DIAGNOSIS — R200 Anesthesia of skin: Secondary | ICD-10-CM | POA: Diagnosis not present

## 2015-05-30 DIAGNOSIS — K219 Gastro-esophageal reflux disease without esophagitis: Secondary | ICD-10-CM | POA: Insufficient documentation

## 2015-05-30 DIAGNOSIS — R030 Elevated blood-pressure reading, without diagnosis of hypertension: Secondary | ICD-10-CM | POA: Diagnosis not present

## 2015-05-30 DIAGNOSIS — Z7951 Long term (current) use of inhaled steroids: Secondary | ICD-10-CM | POA: Diagnosis not present

## 2015-05-30 DIAGNOSIS — Z8619 Personal history of other infectious and parasitic diseases: Secondary | ICD-10-CM | POA: Insufficient documentation

## 2015-05-30 DIAGNOSIS — J45909 Unspecified asthma, uncomplicated: Secondary | ICD-10-CM | POA: Diagnosis not present

## 2015-05-30 LAB — CBC WITH DIFFERENTIAL/PLATELET
Basophils Absolute: 0.1 10*3/uL (ref 0.0–0.1)
Basophils Relative: 1 % (ref 0–1)
EOS ABS: 0.4 10*3/uL (ref 0.0–0.7)
EOS PCT: 4 % (ref 0–5)
HCT: 40.4 % (ref 39.0–52.0)
HEMOGLOBIN: 13.4 g/dL (ref 13.0–17.0)
Lymphocytes Relative: 33 % (ref 12–46)
Lymphs Abs: 3.3 10*3/uL (ref 0.7–4.0)
MCH: 29.1 pg (ref 26.0–34.0)
MCHC: 33.2 g/dL (ref 30.0–36.0)
MCV: 87.6 fL (ref 78.0–100.0)
Monocytes Absolute: 0.7 10*3/uL (ref 0.1–1.0)
Monocytes Relative: 7 % (ref 3–12)
Neutro Abs: 5.6 10*3/uL (ref 1.7–7.7)
Neutrophils Relative %: 55 % (ref 43–77)
Platelets: 301 10*3/uL (ref 150–400)
RBC: 4.61 MIL/uL (ref 4.22–5.81)
RDW: 13.6 % (ref 11.5–15.5)
WBC: 10.1 10*3/uL (ref 4.0–10.5)

## 2015-05-30 LAB — BASIC METABOLIC PANEL
Anion gap: 10 (ref 5–15)
BUN: 13 mg/dL (ref 6–20)
CO2: 24 mmol/L (ref 22–32)
CREATININE: 1.43 mg/dL — AB (ref 0.61–1.24)
Calcium: 9 mg/dL (ref 8.9–10.3)
Chloride: 107 mmol/L (ref 101–111)
GFR calc Af Amer: 54 mL/min — ABNORMAL LOW (ref >60)
GFR calc non Af Amer: 46 mL/min — ABNORMAL LOW (ref >60)
Glucose, Bld: 107 mg/dL — ABNORMAL HIGH (ref 65–99)
Potassium: 3.6 mmol/L (ref 3.5–5.1)
SODIUM: 141 mmol/L (ref 135–145)

## 2015-05-30 LAB — TROPONIN I

## 2015-05-30 MED ORDER — ALBUTEROL SULFATE (2.5 MG/3ML) 0.083% IN NEBU
5.0000 mg | INHALATION_SOLUTION | Freq: Once | RESPIRATORY_TRACT | Status: AC
  Administered 2015-05-30: 5 mg via RESPIRATORY_TRACT
  Filled 2015-05-30: qty 6

## 2015-05-30 NOTE — ED Notes (Signed)
Pt states he woke up with acid reflux and burning in his throat  Pt states he got up and felt a little woozy and disoriented   Pt states he checked his blood pressure and it was 160/100  Pt states he had a temp of 101   Pt states he was having tingling in his left hand

## 2015-05-30 NOTE — ED Provider Notes (Signed)
CSN: [id number]     Arrival date & time 05/30/15  0335 History   First MD Initiated Contact with Patient 05/30/15 [phone]     Chief Complaint  Patient presents with  . Hypertension     (Consider location/radiation/quality/duration/timing/severity/associated sxs/prior Treatment) HPI Comments: 74 year old male woke up around 3:00 this morning with reflux-like symptoms and left hand and arm tingling and numbness with elevated blood pressure. No history of similar. Patient has had high blood pressure before but never numbness and tingling in his left arm. Patient denies cardiac history however does have high lipids, high blood pressure. History of asthma she has spreading treatments at home and mild wheezing recently.nonsmoker. No chest pain or shortness of breath. Patient low-grade fever this morning 101. Symptoms resolved after waiting in ER this morning.patient on Benicar for blood pressure takes regularly.  Patient is a 74 y.o. male presenting with hypertension. The history is provided by the patient.  Hypertension Pertinent negatives include no chest pain, no abdominal pain, no headaches and no shortness of breath.    Past Medical History  Diagnosis Date  . Unspecified asthma(493.90)   . Dysphagia   . Cataract   . Acid reflux   . Borderline hypertension   . Shingles   . Urinary frequency   . Shoulder pain, right   . Personal history of colonic polyps 12/1989  . Esophageal stricture   . GERD (gastroesophageal reflux disease)   . Asthma 06/18/2011  . Colon polyps 2011    1 Hyperplastic and 1 Tubular Adenoma    Past Surgical History  Procedure Laterality Date  . Hernia repair    . Hemorrhoid surgery    . Tonsillectomy    . Esophagogastroduodenoscopy (egd) with propofol N/A 02/07/2015    Procedure: ESOPHAGOGASTRODUODENOSCOPY (EGD) WITH PROPOFOL;  Surgeon: Gatha Mayer, MD;  Location: [facility];  Service: Endoscopy;  Laterality: N/A;  . Balloon dilation N/A 02/07/2015    Procedure:  BALLOON DILATION;  Surgeon: Gatha Mayer, MD;  Location: [facility];  Service: Endoscopy;  Laterality: N/A;   Family History  Problem Relation Age of Onset  . Heart disease Mother   . Kidney failure Sister   . Breast cancer Sister   . Lung cancer Brother   . Brain cancer Sister   . Asthma Sister   . Colon cancer Neg Hx    History  Substance Use Topics  . Smoking status: Never Smoker   . Smokeless tobacco: Never Used  . Alcohol Use: No    Review of Systems  Constitutional: Negative for fever and chills.  HENT: Negative for congestion.   Eyes: Negative for visual disturbance.  Respiratory: Negative for shortness of breath.   Cardiovascular: Negative for chest pain.  Gastrointestinal: Negative for vomiting and abdominal pain.  Genitourinary: Negative for dysuria and flank pain.  Musculoskeletal: Negative for back pain, neck pain and neck stiffness.  Skin: Negative for rash.  Neurological: Positive for light-headedness and numbness. Negative for weakness and headaches.      Allergies  Review of patient's allergies indicates no known allergies.  Home Medications   Prior to Admission medications   Medication Sig Start Date End Date Taking? Authorizing Provider  albuterol (PROAIR HFA) 108 (90 BASE) MCG/ACT inhaler Inhale 2 puffs into the lungs every 4 (four) hours as needed for wheezing or shortness of breath (((PLAN B))). 07/04/14  Yes Tammy S Parrett, NP  albuterol (PROVENTIL) (2.5 MG/3ML) 0.083% nebulizer solution Take 3 mLs (2.5 mg total) by nebulization every 4 (  four) hours as needed for wheezing or shortness of breath. 1 treatment every 4 hours as needed for wheezing ((PLAN C)) 04/30/14  Yes Mariea Clonts, MD  aspirin 81 MG EC tablet Take 81 mg by mouth daily.     Yes Historical Provider, MD  famotidine (PEPCID) 20 MG tablet Take 20 mg by mouth daily.   Yes Historical Provider, MD  mometasone (NASONEX) 50 MCG/ACT nasal spray Place 1 spray into the nose 2 (two) times  daily. 11/21/14 12/09/16 Yes Tammy S Parrett, NP  mometasone-formoterol (DULERA) 200-5 MCG/ACT AERO Inhale 2 puffs into the lungs 2 (two) times daily. 11/21/14  Yes Tammy S Parrett, NP  Naproxen Sodium (ALEVE) 220 MG CAPS Take 1 capsule by mouth 2 (two) times daily as needed (for pain). Per bottle as needed for joint pain   Yes Historical Provider, MD  olmesartan (BENICAR) 40 MG tablet Take 20 mg by mouth every evening. Take 1/2 tablet daily   Yes Historical Provider, MD  oxymetazoline (AFRIN) 0.05 % nasal spray Place 1 spray into both nostrils 2 (two) times daily as needed for congestion.   Yes Historical Provider, MD  pantoprazole (PROTONIX) 40 MG tablet Take 1 tablet (40 mg total) by mouth daily. 01/31/15  Yes Lori P Hvozdovic, PA-C  promethazine-codeine (PHENERGAN WITH CODEINE) 6.25-10 MG/5ML syrup Take 5 mLs by mouth every 6 (six) hours as needed for cough. 10/11/14  Yes Tammy S Parrett, NP  Simethicone (GAS-X EXTRA STRENGTH) 125 MG CAPS Take 1 capsule by mouth every 6 (six) hours as needed (for gas). Per box as needed   Yes Historical Provider, MD   BP 150/87 mmHg  Pulse 71  Temp(Src) 97.7 F (36.5 C) (Oral)  Resp 17  Ht 5\' 8"  (1.727 m)  Wt 180 lb (81.647 kg)  BMI 27.38 kg/m2  SpO2 95% Physical Exam  Constitutional: He is oriented to person, place, and time. He appears well-developed and well-nourished.  HENT:  Head: Normocephalic and atraumatic.  Eyes: Conjunctivae are normal. Right eye exhibits no discharge. Left eye exhibits no discharge.  Neck: Normal range of motion. Neck supple. No tracheal deviation present.  Cardiovascular: Normal rate and regular rhythm.   Pulmonary/Chest: Effort normal and breath sounds normal.  Abdominal: Soft. He exhibits no distension. There is no tenderness. There is no guarding.  Musculoskeletal: He exhibits no edema.  Neurological: He is alert and oriented to person, place, and time. No cranial nerve deficit or sensory deficit. Coordination normal. GCS  eye subscore is 4. GCS verbal subscore is 5. GCS motor subscore is 6.  5+ strength in UE and LE with f/e at major joints. Sensation to palpation intact in UE and LE. CNs 2-12 grossly intact.  EOMFI.  PERRL.   Finger nose and coordination intact bilateral.   Visual fields intact to finger testing.   Skin: Skin is warm. No rash noted.  Psychiatric: He has a normal mood and affect.  Nursing note and vitals reviewed.   ED Course  Procedures (including critical care time) Labs Review Labs Reviewed  BASIC METABOLIC PANEL - Abnormal; Notable for the following:    Glucose, Bld 107 (*)    Creatinine, Ser 1.43 (*)    GFR calc non Af Amer 46 (*)    GFR calc Af Amer 54 (*)    All other components within normal limits  CBC WITH DIFFERENTIAL/PLATELET  TROPONIN I    Imaging Review Dg Chest 2 View  05/30/2015   CLINICAL DATA:  Hypertension  EXAM:  CHEST - 2 VIEW  COMPARISON:  07/25/2014  FINDINGS: Cardiac shadow is stable but mildly enlarged. 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 No focal infiltrate or sizable effusion is seen. Stable scarring is noted in the bases bilaterally.  IMPRESSION: No active disease.   Electronically Signed   By: Inez Catalina M.D.   On: 05/30/2015 08:32   Mr Brain Wo Contrast  05/30/2015   CLINICAL DATA:  74 year old male with blurred vision 3 a.m. this morning. Symptoms have resolved since. High blood pressure. Left arm numbness. Initial encounter.  EXAM: MRI HEAD WITHOUT CONTRAST  TECHNIQUE: Multiplanar, multiecho pulse sequences of the brain and surrounding structures were obtained without intravenous contrast.  COMPARISON:  None.  FINDINGS: No acute infarct.  Minimal small vessel disease type changes.  No intracranial hemorrhage.  Mild global atrophy without hydrocephalus.  No intracranial mass lesion noted on this unenhanced exam.  Major intracranial vascular structures are patent.  Post lens replacement.  Minimal exophthalmos.  Mild narrowing upper  cervical canal. Cervical medullary junction, pituitary region and pineal region unremarkable.  Minimal mucosal thickening paranasal sinuses.  IMPRESSION: No acute infarct.  Minimal small vessel disease type changes.  Please see above   Electronically Signed   By: Genia Del M.D.   On: 05/30/2015 09:36     EKG Interpretation   Date/Time:  Tuesday May 30 2015 04:21:11 EDT Ventricular Rate:  76 PR Interval:  170 QRS Duration: 90 QT Interval:  409 QTC Calculation: 460 R Axis:   68 Text Interpretation:  Sinus rhythm Abnormal R-wave progression, early  transition Borderline T abnormalities, diffuse leads Confirmed by Letisia Schwalb   MD, Ita Fritzsche ([id number]) on 05/30/2015 8:22:50 AM      MDM   Final diagnoses:  Numbness of left hand  Essential hypertension   Patient currently has had blood pressure with no symptoms however presented withleft arm numbness tingling elevated blood pressure. With new episode for patient and elevated blood pressure not controlled plan for MRI the brain to look for signs of stroke early this morning. Patient has an NIH score of 0 currently.  Patient has normal neuro exam in the ER. Blood pressure improved with time and patient has outpatient follow-up. MRI results reviewed no acute stroke.  Results and differential diagnosis were discussed with the patient/parent/guardian. Close follow up outpatient was discussed, comfortable with the plan.   Medications  albuterol (PROVENTIL) (2.5 MG/3ML) 0.083% nebulizer solution 5 mg (5 mg Nebulization Given 05/30/15 0745)    Filed Vitals:   05/30/15 0422 05/30/15 0717 05/30/15 0747 05/30/15 1009  BP: 139/74 162/99  150/87  Pulse: 74 77  71  Temp:      TempSrc:      Resp: 21 18  17   Height:      Weight:      SpO2: 93% 97% 94% 95%    Final diagnoses:  Numbness of left hand  Essential hypertension       Elnora Morrison, MD 05/30/15 1031

## 2015-05-30 NOTE — ED Notes (Signed)
Patient transported to MRI 

## 2015-05-30 NOTE — Telephone Encounter (Signed)
Spoke with Cheron Every [facility])- states that the patient is currently in [facility] ED- has been there since 3am this morning.  Pt was having increased wheezing, elevated BP (160/100), fever 101, double vision and tingling in left arm/hand. Pt has been given a neb treatment already and is being sent for a CXR and MRI(head).  Would like Dr Melvyn Novas to be aware.

## 2015-05-30 NOTE — Telephone Encounter (Signed)
aware

## 2015-05-30 NOTE — Discharge Instructions (Signed)
If you were given medicines take as directed.  If you are on coumadin or contraceptives realize their levels and effectiveness is altered by many different medicines.  If you have any reaction (rash, tongues swelling, other) to the medicines stop taking and see a physician.    If your blood pressure was elevated in the ER make sure you follow up for management with a primary doctor or return for chest pain, shortness of breath or stroke symptoms.  Please follow up as directed and return to the ER or see a physician for new or worsening symptoms.  Thank you. Filed Vitals:   05/30/15 0422 05/30/15 0717 05/30/15 0747 05/30/15 1009  BP: 139/74 162/99  150/87  Pulse: 74 77  71  Temp:      TempSrc:      Resp: 21 18  17   Height:      Weight:      SpO2: 93% 97% 94% 95%

## 2015-06-02 ENCOUNTER — Ambulatory Visit (INDEPENDENT_AMBULATORY_CARE_PROVIDER_SITE_OTHER): Payer: Medicare Other | Admitting: Internal Medicine

## 2015-06-02 ENCOUNTER — Encounter: Payer: Self-pay | Admitting: Internal Medicine

## 2015-06-02 VITALS — BP 120/76 | HR 87 | Ht 68.0 in | Wt 196.0 lb

## 2015-06-02 DIAGNOSIS — K2100 Gastro-esophageal reflux disease with esophagitis, without bleeding: Secondary | ICD-10-CM

## 2015-06-02 DIAGNOSIS — J4550 Severe persistent asthma, uncomplicated: Secondary | ICD-10-CM

## 2015-06-02 DIAGNOSIS — I10 Essential (primary) hypertension: Secondary | ICD-10-CM

## 2015-06-02 NOTE — Progress Notes (Signed)
Subjective:   Patient ID: [name]    DOB: 12-14-1941      Brief patient profile: 74 yobm denies ever smoking but has lifelong asthma with severe chronic pattern and best FEV1 of around 2.2 liters documented 2002 followed in pulmonary for primary care also for hbp and hyperlipidemia.    HPI   07/30/13 Allergy eval rx completed x 4 years > d/c'd and f/u allergy prn    07/25/2014 f/u ov/Lashanda Storlie re: severe chronic asthma/hbp/gerd  Chief Complaint  Patient presents with  . Annual Exam    Pt is fasting. He states still having some increased wheezing and dyspnea since last visit- relates to hot weather. Using rescue inhaler daily.  Has used neb x 1 since last visit.   >>prevnar /TD vaccine     04/03/2015 Follow up : chronic asthma/ HTN/non adherence / relies on samples/ no formulary / has med calendar  Pt returns for 1 month follow up .  Doing well with no asthma flare , on Dulera . Marland Kitchen  Need to establish with dentist > multiple teeth extracted   05/16/2015 f/u ov/Ayodeji Keimig re: hbp/ hyperlipidemia/ severe chronic asthma/ rhinitis using med calendar but still having hard time keeping up with meds  Chief Complaint  Patient presents with  . Follow-up    Pt states doing well and denies any co's. He states he seldom uses rescue inhaler/neb.   insurance won't pay for nasonex/ informed flonased and nasacort aq otc and formulary issues (see a/p) rec No change rx/ follow med calendar   06/02/2015 f/u ov/Boneta Standre re: post er f/u for atypica cp attributed to gerd  Chief Complaint  Patient presents with  . Follow-up    Pt went to ED due to increased SOB 05/30/15. He feels well today and BP normal at 120/76.   woke up with   Burning SSCP  3am 6/7 and then got woozy /worried/ L arm tingly so went to er with elevated bp neg w/u and no sob  - was supposed to be on pepcid qhs but admits not taking lately   Completely back to baseline p rx in ER and resumed maint rx   Not limited by breathing from desired  activities  But not really very active - minimal saba use    No obvious day to day or daytime variabilty or assoc chronic cough or cp or chest tightness, subjective wheeze overt sinus or hb symptoms. No unusual exp hx or h/o childhood pna/ asthma or knowledge of premature birth.  Sleeping ok without nocturnal  or early am exacerbation  of respiratory  c/o's or need for noct saba. Also denies any obvious fluctuation of symptoms with weather or environmental changes or other aggravating or alleviating factors except as outlined above   Current Medications, Allergies, Complete Past Medical History, Past Surgical History, Family History, and Social History were reviewed in [employer] record.  ROS  The following are not active complaints unless bolded sore throat, dysphagia, dental problems, itching, sneezing,  nasal congestion or excess/ purulent secretions, ear ache,   fever, chills, sweats, unintended wt loss, pleuritic or exertional cp, hemoptysis,  orthopnea pnd or leg swelling, presyncope, palpitations, heartburn, abdominal pain, anorexia, nausea, vomiting, diarrhea  or change in bowel or urinary habits, change in stools or urine, dysuria,hematuria,  rash, arthralgias, visual complaints, headache, numbness weakness or ataxia or problems with walking or coordination,  change in mood/affect or memory.  Past Medical Hx ASTHMA (ICD-493.90)  --chronic severe asthma with best FEV1 2.2 L documented 01/2001 and  documented non-adherence Mainly lives off samples of inhalers and nose sprays  - HFA 75% June 15, 2009 > 90% August 02, 2009 > 100% December 27, 2009  - Off allergy vaccines x7/2010 > highly allergic skin tests per Dr Annamaria Boots > restarted vaccine November 06, 2009 > stopped  07/2013  SHOULDER PAIN, RIGHT (ICD-719.41)   HYPERTENSION, BORDERLINE (ICD-401.9)  Shingles T 8 Left 9/01  HEALTH  MAINTENANCE...............................................................................................Marland KitchenWert  - Colonoscopy 12/1989 , 2011  - dT 07/25/2014  - Pneumovax 3/04 and 05/2009 - Prevnar 07/25/14 -Flu declined October 17, 2008 and September 21, 2009 , declined flu shot 2015  -CPX 07/25/2014  - Meds reviewed with pt education and computerized med calendar adjusted October 19, 2009, February 20, 2010, September 10, 2010 , 05/22/2012 , 03/01/2013, 10/26/2013 , 03/30/2014 . 07/04/2014 ,. 08/22/2014 , 01/16/2015            Objective:   Physical Exam    amb bm nad     05/23/2014  201> 201 07/04/2014 > 07/25/14  196>198 08/22/2014 >194 09/19/2014 >198 10/11/2014 > 10/24/2014  196>   01/02/2015 193 > 03/06/2015  198 >198 04/03/2015 > 05/16/2015 196 >  06/02/2015 196      HEENT: nl dentition, turbinates, and orophanx. Nl external ear canals without cough reflex, poor dentition    NECK :  without JVD/Nodes/TM/ nl carotid upstrokes bilaterally   LUNGS: no acc muscle use, hyperresonant to percussion,  Distant BS bilaterally  w/ no wheezing    CV:  RRR  no s3 or murmur or increase in P2, no edema   ABD:  soft and nontender with nl excursion in the supine position. No bruits or organomegaly, bowel sounds nl  MS:  Nl gait, warm without deformities, nl gait, no calf tenderness, cyanosis or clubbing        I personally reviewed images and agree with radiology impression as follows:  CXR:  05/30/15 Cardiac shadow is stable but mildly enlarged. 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 No focal infiltrate or sizable effusion is seen. Stable scarring is noted in the bases bilaterally.        Assessment & Plan:

## 2015-06-02 NOTE — Patient Instructions (Addendum)
Change follow up to  See [name] w/in 2 weeks with all your medications, even over the counter meds, separated in two separate bags, the ones you take no matter what vs the ones you stop once you feel better and take only as needed when you feel you need them.   Tammy  will generate for you a new user friendly medication calendar that will put [place] all on the same page re: your medication use.     Without this process, it simply isn't possible to assure that we are providing  your outpatient care  with  the attention to detail we feel you deserve.   If we cannot assure that you're getting that kind of care,  then we cannot manage your problem effectively from this clinic.  Once you have seen Tammy and we are sure that we're all on the same page with your medication use she will arrange follow up with me. Late add reassess finances/forumlary issues at next ov to prevent future events leading to unwarranted  ER use

## 2015-06-03 ENCOUNTER — Encounter: Payer: Self-pay | Admitting: Internal Medicine

## 2015-06-03 NOTE — Assessment & Plan Note (Signed)
Adequate control on present rx, reviewed > no change in rx needed  (unclear whether bp up due to non-adherence but fine again back on meds  Need to reassess finances/forumlary issues at next ov to prevent future events leading to unwarranted  ER use

## 2015-06-03 NOTE — Assessment & Plan Note (Signed)
--  chronic severe asthma with best FEV1 2.2 L documented 01/2001 and documented non-adherence Mainly lives off samples of inhalers and nose sprays  - Off allergy vaccines x7/2010 > highly allergic skin tests per Dr Annamaria Boots > restart vaccine November 06, 2009>stop 07/30/13 to watch - Spirometry on "good day" 01/14/12 =  FEV1 1.54  Ratio 0.58 - 05/23/14 p extensive coaching HFA effectiveness =    90% from baseline of 50%  All goals of chronic asthma control met including optimal (though no where near nl) function and elimination of symptoms with minimal need for rescue therapy.  Contingencies discussed in full including contacting this office immediately if not controlling the symptoms using the rule of two's.

## 2015-06-03 NOTE — Assessment & Plan Note (Signed)
GERD>>endo 03/20/11 -esophageal stricture-s/p dilatation, ulcerated stricture-bx was c/w gerd -neg for barretts. Rec for PPI  F/by Dr. Danise [facility]    Unfortunately not compliant with meds for gerd  (? Cost issue) and ended up in ER for exac of gerd  I had an extended discussion with the patient reviewing all relevant studies completed to date and  lasting 15 to 20 minutes of a 25 minute visit on the following ongoing concerns:   Emphasized difference between the top portion of his calendar = bag #1 is up to [place] (HCP) to adjust, bag #2 is up to you and is the bottom portion and gered rxis in the top.    Each maintenance medication was reviewed in detail including most importantly the difference between maintenance and as needed and under what circumstances the prns are to be used. This was done in the context of a medication calendar review which provided the patient with a user-friendly unambiguous mechanism for medication administration and reconciliation and provides an action plan for all active problems. It is critical that this be shown to every doctor  for modification during the office visit if necessary so the patient can use it as a working document.      Will continue to use a trust but verify approach here with freq f/u to keep him from running out of meds and into  ER

## 2015-06-16 ENCOUNTER — Encounter: Payer: Self-pay | Admitting: Adult Health

## 2015-06-16 ENCOUNTER — Ambulatory Visit (INDEPENDENT_AMBULATORY_CARE_PROVIDER_SITE_OTHER): Payer: Medicare Other | Admitting: Adult Health

## 2015-06-16 VITALS — BP 136/86 | HR 83 | Temp 97.8°F | Ht 68.0 in | Wt 197.0 lb

## 2015-06-16 DIAGNOSIS — J4550 Severe persistent asthma, uncomplicated: Secondary | ICD-10-CM

## 2015-06-16 DIAGNOSIS — I10 Essential (primary) hypertension: Secondary | ICD-10-CM

## 2015-06-16 NOTE — Progress Notes (Signed)
Subjective:   Patient ID: [name]    DOB: October 21, 1941      Brief patient profile: 74 yobm denies ever smoking but has lifelong asthma with severe chronic pattern and best FEV1 of around 2.2 liters documented 2002 followed in pulmonary for primary care also for hbp and hyperlipidemia.    HPI   07/30/13 Allergy eval rx completed x 4 years > d/c'd and f/u allergy prn    07/25/2014 f/u ov/Wert re: severe chronic asthma/hbp/gerd  Chief Complaint  Patient presents with  . Annual Exam    Pt is fasting. He states still having some increased wheezing and dyspnea since last visit- relates to hot weather. Using rescue inhaler daily.  Has used neb x 1 since last visit.   >>prevnar /TD vaccine     04/03/2015 Follow up : chronic asthma/ HTN/non adherence / relies on samples/ no formulary / has med calendar  Pt returns for 1 month follow up .  Doing well with no asthma flare , on Dulera . Marland Kitchen  Need to establish with dentist > multiple teeth extracted   05/16/2015 f/u ov/Wert re: hbp/ hyperlipidemia/ severe chronic asthma/ rhinitis using med calendar but still having hard time keeping up with meds  Chief Complaint  Patient presents with  . Follow-up    Pt states doing well and denies any co's. He states he seldom uses rescue inhaler/neb.   insurance won't pay for nasonex/ informed flonased and nasacort aq otc and formulary issues (see a/p) rec No change rx/ follow med calendar   06/02/2015 f/u ov/Wert re: post er f/u for atypica cp attributed to gerd  Chief Complaint  Patient presents with  . Follow-up    Pt went to ED due to increased SOB 05/30/15. He feels well today and BP normal at 120/76.   woke up with   Burning SSCP  3am 6/7 and then got woozy /worried/ L arm tingly so went to er with elevated bp neg w/u and no sob  - was supposed to be on pepcid qhs but admits not taking lately   Completely back to baseline p rx in ER and resumed maint rx  >no changes  06/16/2015 follow-up and  medication review:  hbp/ hyperlipidemia/ severe chronic asthma/ rhinitis Patient returns for two-week follow-up. Patient says overall his breathing has been doing well. We reviewed all his medications organize them into a medication count with patient education unfortunately Patient did not bring his formulary today. He denies any chest pain, orthopnea, PND or leg swelling He denies a flare of cough or wheezing. Does appear to be taking his inhaler as directed. However, he does continue to depend on samples. Even with his insurance. He has trouble affording the copayments.    Current Medications, Allergies, Complete Past Medical History, Past Surgical History, Family History, and Social History were reviewed in [employer] record.  ROS  The following are not active complaints unless bolded sore throat, dysphagia, dental problems, itching, sneezing,  nasal congestion or excess/ purulent secretions, ear ache,   fever, chills, sweats, unintended wt loss, pleuritic or exertional cp, hemoptysis,  orthopnea pnd or leg swelling, presyncope, palpitations, heartburn, abdominal pain, anorexia, nausea, vomiting, diarrhea  or change in bowel or urinary habits, change in stools or urine, dysuria,hematuria,  rash, arthralgias, visual complaints, headache, numbness weakness or ataxia or problems with walking or coordination,  change in mood/affect or memory.             Past Medical Hx  ASTHMA (ICD-493.90)  --chronic severe asthma with best FEV1 2.2 L documented 01/2001 and  documented non-adherence Mainly lives off samples of inhalers and nose sprays  - HFA 75% June 15, 2009 > 90% August 02, 2009 > 100% December 27, 2009  - Off allergy vaccines x7/2010 > highly allergic skin tests per Dr Annamaria Boots > restarted vaccine November 06, 2009 > stopped  07/2013  SHOULDER PAIN, RIGHT (ICD-719.41)   HYPERTENSION, BORDERLINE (ICD-401.9)  Shingles T 8 Left 9/01  HEALTH  MAINTENANCE...............................................................................................Marland KitchenWert  - Colonoscopy 12/1989 , 2011  - dT 07/25/2014  - Pneumovax 3/04 and 05/2009 - Prevnar 07/25/14 -Flu declined October 17, 2008 and September 21, 2009 , declined flu shot 2015  -CPX 07/25/2014  - Meds reviewed with pt education and computerized med calendar adjusted October 19, 2009, February 20, 2010, September 10, 2010 , 05/22/2012 , 03/01/2013, 10/26/2013 , 03/30/2014 . 07/04/2014 ,. 08/22/2014 , 01/16/2015 06/16/2015            Objective:   Physical Exam    amb bm nad     05/23/2014  201> 201 07/04/2014 > 07/25/14  196>198 08/22/2014 >194 09/19/2014 >198 10/11/2014 > 10/24/2014  196>   01/02/2015 193 > 03/06/2015  198 >198 04/03/2015 > 05/16/2015 196 >  06/02/2015 196 >197 06/16/2015      HEENT: nl dentition, turbinates, and orophanx. Nl external ear canals without cough reflex, poor dentition    NECK :  without JVD/Nodes/TM/ nl carotid upstrokes bilaterally   LUNGS: no acc muscle use, hyperresonant to percussion,  Distant BS bilaterally  w/ no wheezing    CV:  RRR  no s3 or murmur or increase in P2, no edema   ABD:  soft and nontender with nl excursion in the supine position. No bruits or organomegaly, bowel sounds nl  MS:  Nl gait, warm without deformities, nl gait, no calf tenderness, cyanosis or clubbing         CXR:  05/30/15 Cardiac shadow is stable but mildly enlarged. 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 No focal infiltrate or sizable effusion is seen. Stable scarring is noted in the bases bilaterally.        Assessment & Plan:

## 2015-06-16 NOTE — Patient Instructions (Signed)
Follow up in 6 -8 weeks with Dr. Melvyn Novas and As needed   Follow med calendar closely and bring to each visit.  Low cholesterol and salt diet .  Exercise as able, weight loss.  Need to establish with dentist  Please contact office for sooner follow up if symptoms do not improve or worsen or seek emergency care

## 2015-06-16 NOTE — Assessment & Plan Note (Signed)
Compensated on present regimen  Plan ollow up in 6 -8 weeks with Dr. Melvyn Novas and As needed   Follow med calendar closely and bring to each visit.  Please contact office for sooner follow up if symptoms do not improve or worsen or seek emergency care

## 2015-06-16 NOTE — Progress Notes (Signed)
Chart and office note reviewed in detail  > agree with a/p as outlined    

## 2015-06-16 NOTE — Assessment & Plan Note (Signed)
controlled 

## 2015-06-19 NOTE — Addendum Note (Signed)
Addended by: Parke Poisson E on: 06/19/2015 11:40 AM   Modules accepted: Orders, Medications

## 2015-06-25 ENCOUNTER — Encounter: Payer: Self-pay | Admitting: Internal Medicine

## 2015-06-25 DIAGNOSIS — Z86010 Personal history of colonic polyps: Secondary | ICD-10-CM

## 2015-06-25 DIAGNOSIS — Z860101 Personal history of adenomatous and serrated colon polyps: Secondary | ICD-10-CM

## 2015-06-25 HISTORY — DX: Personal history of adenomatous and serrated colon polyps: Z86.0101

## 2015-06-25 HISTORY — DX: Personal history of colonic polyps: Z86.010

## 2015-06-29 ENCOUNTER — Encounter: Payer: Self-pay | Admitting: Internal Medicine

## 2015-07-17 ENCOUNTER — Encounter: Payer: Self-pay | Admitting: Internal Medicine

## 2015-07-28 DIAGNOSIS — M9911 Subluxation complex (vertebral) of cervical region: Secondary | ICD-10-CM | POA: Diagnosis not present

## 2015-07-28 DIAGNOSIS — M5136 Other intervertebral disc degeneration, lumbar region: Secondary | ICD-10-CM | POA: Diagnosis not present

## 2015-07-28 DIAGNOSIS — M9912 Subluxation complex (vertebral) of thoracic region: Secondary | ICD-10-CM | POA: Diagnosis not present

## 2015-07-28 DIAGNOSIS — M9915 Subluxation complex (vertebral) of pelvic region: Secondary | ICD-10-CM | POA: Diagnosis not present

## 2015-07-28 DIAGNOSIS — M9914 Subluxation complex (vertebral) of sacral region: Secondary | ICD-10-CM | POA: Diagnosis not present

## 2015-07-28 DIAGNOSIS — M5033 Other cervical disc degeneration, cervicothoracic region: Secondary | ICD-10-CM | POA: Diagnosis not present

## 2015-07-28 DIAGNOSIS — M9913 Subluxation complex (vertebral) of lumbar region: Secondary | ICD-10-CM | POA: Diagnosis not present

## 2015-07-31 ENCOUNTER — Ambulatory Visit: Payer: Medicare Other | Admitting: Internal Medicine

## 2015-08-16 ENCOUNTER — Ambulatory Visit: Payer: Medicare Other | Admitting: Adult Health

## 2015-09-11 ENCOUNTER — Ambulatory Visit (AMBULATORY_SURGERY_CENTER): Payer: Self-pay

## 2015-09-11 VITALS — Ht 68.0 in | Wt 194.6 lb

## 2015-09-11 DIAGNOSIS — Z860100 Personal history of colon polyps, unspecified: Secondary | ICD-10-CM

## 2015-09-11 NOTE — Progress Notes (Signed)
No allergies to eggs or soy No diet/weight loss meds No home oxygen No past problems with anesthesia  Refused emmi 

## 2015-09-15 ENCOUNTER — Telehealth: Payer: Self-pay | Admitting: Internal Medicine

## 2015-09-15 NOTE — Telephone Encounter (Signed)
Spoke with pt. Aware 2 samples of dulera left for pick up. Nothing further needed

## 2015-09-25 ENCOUNTER — Encounter: Payer: Medicare Other | Admitting: Internal Medicine

## 2015-09-28 ENCOUNTER — Ambulatory Visit (AMBULATORY_SURGERY_CENTER): Payer: Medicare Other | Admitting: Internal Medicine

## 2015-09-28 ENCOUNTER — Encounter: Payer: Self-pay | Admitting: Internal Medicine

## 2015-09-28 VITALS — BP 143/92 | HR 65 | Temp 96.2°F | Resp 23 | Ht 68.0 in | Wt 194.0 lb

## 2015-09-28 DIAGNOSIS — J45909 Unspecified asthma, uncomplicated: Secondary | ICD-10-CM | POA: Diagnosis not present

## 2015-09-28 DIAGNOSIS — D123 Benign neoplasm of transverse colon: Secondary | ICD-10-CM | POA: Diagnosis not present

## 2015-09-28 DIAGNOSIS — Z86010 Personal history of colonic polyps: Secondary | ICD-10-CM

## 2015-09-28 DIAGNOSIS — I10 Essential (primary) hypertension: Secondary | ICD-10-CM | POA: Diagnosis not present

## 2015-09-28 DIAGNOSIS — K219 Gastro-esophageal reflux disease without esophagitis: Secondary | ICD-10-CM | POA: Diagnosis not present

## 2015-09-28 DIAGNOSIS — E669 Obesity, unspecified: Secondary | ICD-10-CM | POA: Diagnosis not present

## 2015-09-28 DIAGNOSIS — D124 Benign neoplasm of descending colon: Secondary | ICD-10-CM

## 2015-09-28 MED ORDER — SODIUM CHLORIDE 0.9 % IV SOLN: 500.0000 mL | INTRAVENOUS | Status: DC

## 2015-09-28 NOTE — Patient Instructions (Addendum)
I found and removed 2 small polyps. I will let you know pathology results by mail.  I appreciate the opportunity to care for you. Gatha Mayer, MD, [facility]  Discharge instructions given. Handouts on polyps and diverticulosis. Resume previous medications. YOU HAD AN ENDOSCOPIC PROCEDURE TODAY AT [facility]:   Refer to the procedure report that was given to you for any specific questions about what was found during the examination.  If the procedure report does not answer your questions, please call your gastroenterologist to clarify.  If you requested that your care partner not be given the details of your procedure findings, then the procedure report has been included in a sealed envelope for you to review at your convenience later.  YOU SHOULD EXPECT: Some feelings of bloating in the abdomen. Passage of more gas than usual.  Walking can help get rid of the air that was put into your GI tract during the procedure and reduce the bloating. If you had a lower endoscopy (such as a colonoscopy or flexible sigmoidoscopy) you may notice spotting of blood in your stool or on the toilet paper. If you underwent a bowel prep for your procedure, you may not have a normal bowel movement for a few days.  Please Note:  You might notice some irritation and congestion in your nose or some drainage.  This is from the oxygen used during your procedure.  There is no need for concern and it should clear up in a day or so.  SYMPTOMS TO REPORT IMMEDIATELY:   Following lower endoscopy (colonoscopy or flexible sigmoidoscopy):  Excessive amounts of blood in the stool  Significant tenderness or worsening of abdominal pains  Swelling of the abdomen that is new, acute  Fever of 100F or higher   For urgent or emergent issues, a gastroenterologist can be reached at any hour by calling [phone].   DIET: Your first meal following the procedure should be a small meal and then it is ok to  progress to your normal diet. Heavy or fried foods are harder to digest and may make you feel nauseous or bloated.  Likewise, meals heavy in dairy and vegetables can increase bloating.  Drink plenty of fluids but you should avoid alcoholic beverages for 24 hours.  ACTIVITY:  You should plan to take it easy for the rest of today and you should NOT DRIVE or use heavy machinery until tomorrow (because of the sedation medicines used during the test).    FOLLOW UP: Our staff will call the number listed on your records the next business day following your procedure to check on you and address any questions or concerns that you may have regarding the information given to you following your procedure. If we do not reach you, we will leave a message.  However, if you are feeling well and you are not experiencing any problems, there is no need to return our call.  We will assume that you have returned to your regular daily activities without incident.  If any biopsies were taken you will be contacted by phone or by letter within the next 1-3 weeks.  Please call us at [phone] if you have not heard about the biopsies in 3 weeks.    SIGNATURES/CONFIDENTIALITY: You and/or your care partner have signed paperwork which will be entered into your electronic medical record.  These signatures attest to the fact that that the information above on your After Visit Summary has been reviewed and  is understood.  Full responsibility of the confidentiality of this discharge information lies with you and/or your care-partner.

## 2015-09-28 NOTE — Progress Notes (Signed)
Report to PACU, RN, vss, BBS= Clear.  

## 2015-09-28 NOTE — Op Note (Signed)
[place]  [address] [place], [address]   COLONOSCOPY PROCEDURE REPORT  PATIENT: [name], [name]  MR#: [id number] BIRTHDATE: 01/03/1941 , 49  yrs. old GENDER: male ENDOSCOPIST: Gatha Mayer, MD, [facility] PROCEDURE DATE:  09/28/2015 PROCEDURE:   Colonoscopy, surveillance , Colonoscopy with biopsy, and Colonoscopy with snare polypectomy First Screening Colonoscopy - Avg.  risk and is 50 yrs.  old or older - No.  Prior Negative Screening - Now for repeat screening. N/A  History of Adenoma - Now for follow-up colonoscopy & has been > or = to 3 yrs.  Yes hx of adenoma.  Has been 3 or more years since last colonoscopy.  Polyps removed today? Yes ASA CLASS:   Class III INDICATIONS:Surveillance due to prior colonic neoplasia and PH Colon Adenoma. MEDICATIONS: Propofol 150 mg IV and Monitored anesthesia care  DESCRIPTION OF PROCEDURE:   After the risks benefits and alternatives of the procedure were thoroughly explained, informed consent was obtained.  The digital rectal exam revealed no abnormalities of the rectum, revealed no prostatic nodules, and revealed the prostate was not enlarged.   The LB [id number] endoscope was introduced through the anus and advanced to the cecum, which was identified by both the appendix and ileocecal valve. No adverse events experienced.   The quality of the prep was good.  (MiraLax was used)  The instrument was then slowly withdrawn as the colon was fully examined. Estimated blood loss is zero unless otherwise noted in this procedure report.  COLON FINDINGS: Two polypoid shaped sessile polyps ranging from 2 to 84mm in size were found in the transverse colon and descending colon.  Polypectomies were performed with a cold snare (5 mm transverse) and with cold forceps (2 mm descedning).  The resection was complete, the polyp tissue was completely retrieved and sent to histology.   There was mild diverticulosis noted in the left  colon. The examination was otherwise normal.  Retroflexed views revealed no abnormalities. The time to cecum = 1.8 Withdrawal time = 9.3 The scope was withdrawn and the procedure completed. COMPLICATIONS: There were no immediate complications.  ENDOSCOPIC IMPRESSION: 1.   Two sessile polyps ranging from 2 to 57mm in size were found in the transverse colon and descending colon; polypectomies were performed with a cold snare and with cold forceps 2.   Mild diverticulosis was noted in the left colon 3.   The examination was otherwise normal - good prep  RECOMMENDATIONS: 1.  Await pathology results 2.  Unlikely to need another routine colonoscopy.  he is 19 and hx diminutive adenoma in 2011 and these polyps today  eSigned:  Gatha Mayer, MD, [facility] 09/28/2015 10:21 AM   cc: The Patient

## 2015-09-28 NOTE — Progress Notes (Signed)
Called to room to assist during endoscopic procedure.  Patient ID and intended procedure confirmed with present staff. Received instructions for my participation in the procedure from the performing physician.  

## 2015-09-29 ENCOUNTER — Telehealth: Payer: Self-pay | Admitting: Emergency Medicine

## 2015-09-29 NOTE — Telephone Encounter (Signed)
  Follow up Call-  Call back number 09/28/2015  Post procedure Call Back phone  # [phone]  Permission to leave phone message Yes     Patient questions:  Do you have a fever, pain , or abdominal swelling? No. Pain Score  0 *  Have you tolerated food without any problems? Yes.    Have you been able to return to your normal activities? Yes.    Do you have any questions about your discharge instructions: Diet   No. Medications  No. Follow up visit  No.  Do you have questions or concerns about your Care? No.  Actions: * If pain score is 4 or above: No action needed, pain <4.

## 2015-10-11 ENCOUNTER — Encounter: Payer: Self-pay | Admitting: Internal Medicine

## 2015-10-11 DIAGNOSIS — Z86010 Personal history of colonic polyps: Secondary | ICD-10-CM

## 2015-10-11 NOTE — Progress Notes (Signed)
Quick Note:  2 dimin adenomas No recall at age 74 ______

## 2015-11-02 ENCOUNTER — Ambulatory Visit (INDEPENDENT_AMBULATORY_CARE_PROVIDER_SITE_OTHER): Payer: Medicare Other | Admitting: Internal Medicine

## 2015-11-02 ENCOUNTER — Encounter: Payer: Self-pay | Admitting: Internal Medicine

## 2015-11-02 VITALS — BP 138/82 | HR 76 | Ht 68.0 in | Wt 196.8 lb

## 2015-11-02 DIAGNOSIS — J4551 Severe persistent asthma with (acute) exacerbation: Secondary | ICD-10-CM | POA: Diagnosis not present

## 2015-11-02 MED ORDER — PREDNISONE 10 MG PO TABS: ORAL_TABLET | ORAL | Status: DC

## 2015-11-02 MED ORDER — ALBUTEROL SULFATE HFA 108 (90 BASE) MCG/ACT IN AERS: INHALATION_SPRAY | RESPIRATORY_TRACT | Status: DC

## 2015-11-02 NOTE — Assessment & Plan Note (Signed)
--  chronic severe asthma with best FEV1 2.2 L documented 01/2001 and documented non-adherence Mainly lives off samples of inhalers and nose sprays  - Off allergy vaccines x7/2010 > highly allergic skin tests per Dr Annamaria Boots > restart vaccine November 06, 2009>stop 07/30/13 to watch - Spirometry on "good day" 01/14/12 =  FEV1 1.54  Ratio 0.58 - 05/23/14 p extensive coaching HFA effectiveness =    90% from baseline of 50%   Remains difficult to control with limited funds to maintain on rx and reliant mostly on samples   rx acute flare with Prednisone 10 mg take  4 each am x 2 days,   2 each am x 2 days,  1 each am x 2 days and stop    I had an extended discussion with the patient reviewing all relevant studies completed to date and  lasting 15 to 20 minutes of a 25 minute visit    Each maintenance medication was reviewed in detail including most importantly the difference between maintenance and prns and under what circumstances the prns are to be triggered using an action plan format that is not reflected in the computer generated alphabetically organized AVS but trather by a customized med calendar that reflects the AVS meds with confirmed 100% correlation.   Please see instructions for details which were reviewed in writing and the patient given a copy highlighting the part that I personally wrote and discussed at today's ov.

## 2015-11-02 NOTE — Patient Instructions (Signed)
Prednisone 10 mg take  4 each am x 2 days,   2 each am x 2 days,  1 each am x 2 days and stop   dulera 200 Take 2 puffs first thing in am and then another 2 puffs about 12 hours later.    See calendar for specific medication instructions and bring it back for each and every office visit for every healthcare provider you see.  Without it,  you may not receive the best quality medical care that we feel you deserve.  You will note that the calendar groups together  your maintenance  medications that are timed at particular times of the day.  Think of this as your checklist for what your doctor has instructed you to do until your next evaluation to see what benefit  there is  to staying on a consistent group of medications intended to keep you well.  The other group at the bottom is entirely up to you to use as you see fit  for specific symptoms that may arise between visits that require you to treat them on an as needed basis.  Think of this as your action plan or "what if" list.   Separating the top medications from the bottom group is fundamental to providing you adequate care going forward.    Please schedule a follow up visit in 3 months but call sooner if needed

## 2015-11-02 NOTE — Progress Notes (Signed)
Subjective:   Patient ID: [name]    DOB: 09/18/1941      Brief patient profile: 74 yobm denies ever smoking but has lifelong asthma with severe chronic pattern and best FEV1 of around 2.2 liters documented 2002 followed in pulmonary for primary care also for hbp and hyperlipidemia.    HPI   07/30/13 Allergy eval rx completed x 4 years > d/c'd and f/u allergy prn    07/25/2014 f/u ov/Wert re: severe chronic asthma/hbp/gerd  Chief Complaint  Patient presents with  . Annual Exam    Pt is fasting. He states still having some increased wheezing and dyspnea since last visit- relates to hot weather. Using rescue inhaler daily.  Has used neb x 1 since last visit.   >>prevnar /TD vaccine     04/03/2015 Follow up : chronic asthma/ HTN/non adherence / relies on samples/ no formulary / has med calendar  Pt returns for 1 month follow up .  Doing well with no asthma flare , on Dulera . Marland Kitchen  Need to establish with dentist > multiple teeth extracted   05/16/2015 f/u ov/Wert re: hbp/ hyperlipidemia/ severe chronic asthma/ rhinitis using med calendar but still having hard time keeping up with meds  Chief Complaint  Patient presents with  . Follow-up    Pt states doing well and denies any co's. He states he seldom uses rescue inhaler/neb.   insurance won't pay for nasonex/ informed flonased and nasacort aq otc and formulary issues (see a/p) rec No change rx/ follow med calendar   06/02/2015 f/u ov/Wert re: post er f/u for atypica cp attributed to gerd  Chief Complaint  Patient presents with  . Follow-up    Pt went to ED due to increased SOB 05/30/15. He feels well today and BP normal at 120/76.   woke up with   Burning SSCP  3am 6/7 and then got woozy /worried/ L arm tingly so went to er with elevated bp neg w/u and no sob  - was supposed to be on pepcid qhs but admits not taking lately   Completely back to baseline p rx in ER and resumed maint rx  >no changes  06/16/2015 NP  follow-up  and medication review:  hbp/ hyperlipidemia/ severe chronic asthma/ rhinitis Patient returns for two-week follow-up. Patient says overall his breathing has been doing well. We reviewed all his medications organize them into a medication count with patient education unfortunately Patient did not bring his formulary today. He denies any chest pain, orthopnea, PND or leg swelling He denies a flare of cough or wheezing. Does appear to be taking his inhaler as directed. However, he does continue to depend on samples. Even with his insurance. He has trouble affording the copayments. rec Follow up in 6 -8 weeks with Dr. Melvyn Novas and As needed   Follow med calendar closely and bring to each visit.  Low cholesterol and salt diet .  Exercise as able, weight loss.     11/02/2015  f/u ov/Wert re: chronic asthma/ difficult to control Chief Complaint  Patient presents with  . Follow-up    Pt c/o increased chest congestion, cough with clear mucus and wheeze. Pt denies SOB/CP. Pt also c/o sternum tenderness and possible vein enlargement.       No obvious day to day or daytime variability or assoc  cp or chest tightness,  or overt sinus or hb symptoms. No unusual exp hx or h/o childhood pna/ asthma or knowledge of premature birth.  Sleeping  ok without nocturnal  or early am exacerbation  of respiratory  c/o's or need for noct saba. Also denies any obvious fluctuation of symptoms with weather or environmental changes or other aggravating or alleviating factors except as outlined above   Current Medications, Allergies, Complete Past Medical History, Past Surgical History, Family History, and Social History were reviewed in [employer] record.  ROS  The following are not active complaints unless bolded sore throat, dysphagia, dental problems, itching, sneezing,  nasal congestion or excess/ purulent secretions, ear ache,   fever, chills, sweats, unintended wt loss, classically pleuritic  or exertional cp, hemoptysis,  orthopnea pnd or leg swelling, presyncope, palpitations, abdominal pain, anorexia, nausea, vomiting, diarrhea  or change in bowel or bladder habits, change in stools or urine, dysuria,hematuria,  rash, arthralgias, visual complaints, headache, numbness, weakness or ataxia or problems with walking or coordination,  change in mood/affect or memory.                Past Medical Hx ASTHMA (ICD-493.90)  --chronic severe asthma with best FEV1 2.2 L documented 01/2001 and  documented non-adherence Mainly lives off samples of inhalers and nose sprays  - HFA 75% June 15, 2009 > 90% August 02, 2009 > 100% December 27, 2009  - Off allergy vaccines x7/2010 > highly allergic skin tests per Dr Annamaria Boots > restarted vaccine November 06, 2009 > stopped  07/2013  SHOULDER PAIN, RIGHT (ICD-719.41)   HYPERTENSION, BORDERLINE (ICD-401.9)  Shingles T 8 Left 9/01  HEALTH MAINTENANCE...............................................................................................Marland KitchenWert  - Colonoscopy 12/1989 , 2011  - dT 07/25/2014  - Pneumovax 3/04 and 05/2009 - Prevnar 07/25/14 -Flu declined October 17, 2008 and September 21, 2009 , declined flu shot 2015  -CPX 07/25/2014  - Meds reviewed with pt education and computerized med calendar adjusted October 19, 2009, February 20, 2010, September 10, 2010 , 05/22/2012 , 03/01/2013, 10/26/2013 , 03/30/2014 . 07/04/2014 ,. 08/22/2014 , 01/16/2015 06/16/2015            Objective:   Physical Exam    amb bm nad     05/23/2014  201> 201 07/04/2014 > 07/25/14  196>198 08/22/2014 >194 09/19/2014 >198 10/11/2014 > 10/24/2014  196>   01/02/2015 193 > 03/06/2015  198 >198 04/03/2015 > 05/16/2015 196 >  06/02/2015 196 >197 06/16/2015      HEENT: nl dentition, turbinates, and orophanx. Nl external ear canals without cough reflex, poor dentition    NECK :  without JVD/Nodes/TM/ nl carotid upstrokes bilaterally   LUNGS: no acc muscle use, hyperresonant to percussion,  Distant BS  bilaterally  w/ no wheezing    CV:  RRR  no s3 or murmur or increase in P2, no edema   ABD:  soft and nontender with nl excursion in the supine position. No bruits or organomegaly, bowel sounds nl  MS:  Nl gait, warm without deformities, nl gait, no calf tenderness, cyanosis or clubbing         CXR:  05/30/15 Cardiac shadow is stable but mildly enlarged. 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 No focal infiltrate or sizable effusion is seen. Stable scarring is noted in the bases bilaterally.        Assessment & Plan:

## 2015-12-01 DIAGNOSIS — M9911 Subluxation complex (vertebral) of cervical region: Secondary | ICD-10-CM | POA: Diagnosis not present

## 2015-12-01 DIAGNOSIS — M9913 Subluxation complex (vertebral) of lumbar region: Secondary | ICD-10-CM | POA: Diagnosis not present

## 2015-12-01 DIAGNOSIS — M9912 Subluxation complex (vertebral) of thoracic region: Secondary | ICD-10-CM | POA: Diagnosis not present

## 2015-12-01 DIAGNOSIS — M9915 Subluxation complex (vertebral) of pelvic region: Secondary | ICD-10-CM | POA: Diagnosis not present

## 2015-12-01 DIAGNOSIS — M5033 Other cervical disc degeneration, cervicothoracic region: Secondary | ICD-10-CM | POA: Diagnosis not present

## 2015-12-01 DIAGNOSIS — M5136 Other intervertebral disc degeneration, lumbar region: Secondary | ICD-10-CM | POA: Diagnosis not present

## 2015-12-01 DIAGNOSIS — M9914 Subluxation complex (vertebral) of sacral region: Secondary | ICD-10-CM | POA: Diagnosis not present

## 2015-12-12 ENCOUNTER — Other Ambulatory Visit (INDEPENDENT_AMBULATORY_CARE_PROVIDER_SITE_OTHER): Payer: Medicare Other

## 2015-12-12 ENCOUNTER — Telehealth: Payer: Self-pay | Admitting: Internal Medicine

## 2015-12-12 ENCOUNTER — Ambulatory Visit (INDEPENDENT_AMBULATORY_CARE_PROVIDER_SITE_OTHER): Payer: Medicare Other | Admitting: Internal Medicine

## 2015-12-12 ENCOUNTER — Ambulatory Visit (INDEPENDENT_AMBULATORY_CARE_PROVIDER_SITE_OTHER)
Admission: RE | Admit: 2015-12-12 | Discharge: 2015-12-12 | Disposition: A | Discharge status: Home / Self Care | Payer: Medicare Other | Source: Ambulatory Visit | Attending: Internal Medicine | Admitting: Internal Medicine

## 2015-12-12 ENCOUNTER — Encounter: Payer: Self-pay | Admitting: Internal Medicine

## 2015-12-12 VITALS — BP 136/82 | HR 65 | Ht 68.0 in | Wt 199.8 lb

## 2015-12-12 DIAGNOSIS — J3089 Other allergic rhinitis: Secondary | ICD-10-CM

## 2015-12-12 DIAGNOSIS — J302 Other seasonal allergic rhinitis: Secondary | ICD-10-CM

## 2015-12-12 DIAGNOSIS — J309 Allergic rhinitis, unspecified: Secondary | ICD-10-CM

## 2015-12-12 DIAGNOSIS — R062 Wheezing: Secondary | ICD-10-CM | POA: Diagnosis not present

## 2015-12-12 DIAGNOSIS — J4551 Severe persistent asthma with (acute) exacerbation: Secondary | ICD-10-CM | POA: Diagnosis not present

## 2015-12-12 DIAGNOSIS — J45901 Unspecified asthma with (acute) exacerbation: Secondary | ICD-10-CM | POA: Diagnosis not present

## 2015-12-12 LAB — CBC WITH DIFFERENTIAL/PLATELET
Basophils Absolute: 0.1 10*3/uL (ref 0.0–0.1)
Basophils Relative: 0.6 % (ref 0.0–3.0)
EOS PCT: 7.5 % — AB (ref 0.0–5.0)
Eosinophils Absolute: 0.7 10*3/uL (ref 0.0–0.7)
HEMATOCRIT: 44.7 % (ref 39.0–52.0)
HEMOGLOBIN: 14.7 g/dL (ref 13.0–17.0)
LYMPHS PCT: 41.8 % (ref 12.0–46.0)
Lymphs Abs: 4.2 10*3/uL — ABNORMAL HIGH (ref 0.7–4.0)
MCHC: 33 g/dL (ref 30.0–36.0)
MCV: 86.9 fl (ref 78.0–100.0)
MONOS PCT: 6.8 % (ref 3.0–12.0)
Monocytes Absolute: 0.7 10*3/uL (ref 0.1–1.0)
Neutro Abs: 4.3 10*3/uL (ref 1.4–7.7)
Neutrophils Relative %: 43.3 % (ref 43.0–77.0)
Platelets: 337 10*3/uL (ref 150.0–400.0)
RBC: 5.15 Mil/uL (ref 4.22–5.81)
RDW: 13.9 % (ref 11.5–15.5)
WBC: 10 10*3/uL (ref 4.0–10.5)

## 2015-12-12 MED ORDER — PREDNISONE 10 MG PO TABS: ORAL_TABLET | ORAL | Status: DC

## 2015-12-12 NOTE — Telephone Encounter (Signed)
First available is fine per MW  Pt sent back up to schedulers to make appt

## 2015-12-12 NOTE — Progress Notes (Signed)
Quick Note:  Spoke with pt and notified of results per Dr. Wert. Pt verbalized understanding and denied any questions.  ______ 

## 2015-12-12 NOTE — Progress Notes (Signed)
Subjective:   Patient ID: Author Slaven    DOB: [birth year]      Brief patient profile: 74 yobm denies ever smoking but has lifelong asthma with severe chronic pattern and best FEV1 of around 2.2 liters documented 2002 followed in pulmonary for primary care also for hbp and hyperlipidemia.    History of Present Illness    07/30/13 Allergy eval rx completed x 4 years > d/c'd and f/u allergy prn    07/25/2014 f/u ov/Migdalia Olejniczak re: severe chronic asthma/hbp/gerd  Chief Complaint  Patient presents with  . Annual Exam    Pt is fasting. He states still having some increased wheezing and dyspnea since last visit- relates to hot weather. Using rescue inhaler daily.  Has used neb x 1 since last visit.   >>prevnar /TD vaccine     04/03/2015 Follow up : chronic asthma/ HTN/non adherence / relies on samples/ no formulary / has med calendar  Pt returns for 1 month follow up .  Doing well with no asthma flare , on Dulera . Marland Kitchen  Need to establish with dentist > multiple teeth extracted       11/02/2015  f/u ov/Keyna Blizard re: chronic asthma/ difficult to control Chief Complaint  Patient presents with  . Follow-up    Pt c/o increased chest congestion, cough with clear mucus and wheeze. Pt denies SOB/CP. Pt also c/o sternum tenderness and possible vein enlargement.    rec Prednisone 10 mg take  4 each am x 2 days,   2 each am x 2 days,  1 each am x 2 days and stop  dulera 200 Take 2 puffs first thing in am and then another 2 puffs about 12 hours later.     12/12/2015 acute extended ov/Aundreya Souffrant re: acute exac severe chronic asthma/ maint rx dulera 200 2bid  Chief Complaint  Patient presents with  . Acute Visit    Pt c/o increased wheezing x 4 days. He has minimal cough with blood tinged sputum.    pred helps a lot for sev weeks then gradually worse/ hfa suboptimal / still reliant on samples    No obvious day to day or daytime variability or assoc  cp or chest tightness,  or overt   hb symptoms. No unusual  exp hx or h/o childhood pna/ asthma or knowledge of premature birth.  Sleeping ok without nocturnal  or early am exacerbation  of respiratory  c/o's or need for noct saba. Also denies any obvious fluctuation of symptoms with weather or environmental changes or other aggravating or alleviating factors except as outlined above   Current Medications, Allergies, Complete Past Medical History, Past Surgical History, Family History, and Social History were reviewed in [employer] record.  ROS  The following are not active complaints unless bolded sore throat, dysphagia, dental problems, itching, sneezing,  nasal congestion or excess/ purulent secretions, ear ache,   fever, chills, sweats, unintended wt loss, classically pleuritic or exertional cp, hemoptysis,  orthopnea pnd or leg swelling, presyncope, palpitations, abdominal pain, anorexia, nausea, vomiting, diarrhea  or change in bowel or bladder habits, change in stools or urine, dysuria,hematuria,  rash, arthralgias, visual complaints, headache, numbness, weakness or ataxia or problems with walking or coordination,  change in mood/affect or memory.                Past Medical Hx ASTHMA (ICD-493.90)  --chronic severe asthma with best FEV1 2.2 L documented 01/2001 and  documented non-adherence Mainly lives off samples of inhalers  and nose sprays  - HFA 75% June 15, 2009 > 90% August 02, 2009 > 100% December 27, 2009  - Off allergy vaccines x7/2010 > highly allergic skin tests per Dr Annamaria Boots > restarted vaccine November 06, 2009 > stopped  07/2013  SHOULDER PAIN, RIGHT (ICD-719.41)   HYPERTENSION, BORDERLINE (ICD-401.9)  Shingles T 8 Left 9/01  HEALTH MAINTENANCE...............................................................................................Marland KitchenWert  - Colonoscopy 12/1989 , 2011  - dT 07/25/2014  - Pneumovax 3/04 and 05/2009 - Prevnar 07/25/14 -Flu declined October 17, 2008 and September 21, 2009 , declined flu shot 2015   -CPX 07/25/2014  - Meds reviewed with pt education and computerized med calendar adjusted October 19, 2009, February 20, 2010, September 10, 2010 , 05/22/2012 , 03/01/2013, 10/26/2013 , 03/30/2014 . 07/04/2014 ,. 08/22/2014 , 01/16/2015 06/16/2015            Objective:   Physical Exam    amb bm nad  / vital signs reviewed   05/23/2014  201> 201 07/04/2014 > 07/25/14  196>198 08/22/2014 >194 09/19/2014 >198 10/11/2014 > 10/24/2014  196>   01/02/2015 193 > 03/06/2015  198 >198 04/03/2015 > 05/16/2015 196 >  06/02/2015 196 >197 06/16/2015 > 12/12/2015 199     HEENT: nl dentition,  and orophanx. Nl external ear canals without cough reflex, poor dentition - moderate bilateral turbinate non-specific edema pattern   NECK :  without JVD/Nodes/TM/ nl carotid upstrokes bilaterally   LUNGS: no acc muscle use, hyperresonant to percussion,  Distant BS bilaterally  With mid exp wheezes    CV:  RRR  no s3 or murmur or increase in P2, no edema   ABD:  soft and nontender with nl excursion in the supine position. No bruits or organomegaly, bowel sounds nl  MS:  Nl gait, warm without deformities, nl gait, no calf tenderness, cyanosis or clubbing        CXR PA and Lateral:   12/12/2015 :    I personally reviewed images and agree with radiology impression as follows:    Mild chronic interstitial prominence with minimal bibasilar scarring. There is no evidence of pneumonia, CHF, or other acute cardiopulmonary abnormality.   Labs ordered 12/12/2015  = allergy profile/ cbc with diff    Assessment & Plan:

## 2015-12-12 NOTE — Patient Instructions (Addendum)
Work on inhaler technique:  relax and gently blow all the way out then take a nice smooth deep breath back in, triggering the inhaler at same time you start breathing in.  Hold for up to 5 seconds if you can. Blow out thru nose. Rinse and gargle with water when done    Prednisone 10 mg take  4 each am x 2 days,   2 each am x 2 days,  1 each am x 2 days and stop   Please remember to go to the lab and x-ray department downstairs for your tests - we will call you with the results when they are available.     Please schedule a follow up office visit in 2 weeks, sooner if needed to see [name]  for new med calendar and possible referral back to allergy ? nucala?

## 2015-12-12 NOTE — Telephone Encounter (Signed)
LMTCB x1 to schedule appt at [facility].

## 2015-12-12 NOTE — Assessment & Plan Note (Addendum)
--  chronic severe asthma with best FEV1 2.2 L documented 01/2001 and documented non-adherence Mainly lives off samples of inhalers and nose sprays  - Off allergy vaccines x7/2010 > highly allergic skin tests per Dr Annamaria Boots > restart vaccine November 06, 2009>stop 07/30/13 to watch - Spirometry on "good day" 01/14/12 =  FEV1 1.54  Ratio 0.58   - 12/12/2015  p extensive coaching HFA effectiveness =    90% from baseline of 50%   DDX of  difficult airways management all start with A and  include Adherence, Ace Inhibitors, Acid Reflux, Active Sinus Disease, Alpha 1 Antitripsin deficiency, Anxiety masquerading as Airways dz,  ABPA,  allergy(esp in young), Aspiration (esp in elderly), Adverse effects of meds,  Active smokers, A bunch of PE's (a small clot burden can't cause this syndrome unless there is already severe underlying pulm or vascular dz with poor reserve) plus two Bs  = Bronchiectasis and Beta blocker use..and one C= CHF  In this case Adherence is the biggest issue and starts with  inability to use HFA effectively and also  understand that SABA treats the symptoms but doesn't get to the underlying problem (inflammation).  I used  the analogy of putting steroid cream on a rash to help explain the meaning of topical therapy and the need to get the drug to the target tissue.   - he has lost ground on hfa and needs to first perfect this and if not change to BREO 200 next ov   ? Allergy > recheck allergy profile and consider nucala next as failed allergy shots previously   ? Acid (or non-acid) GERD > always difficult to exclude as up to 75% of pts in some series report no assoc GI/ Heartburn symptoms> rec continue max (24h)  acid suppression and diet restrictions/ reviewed     I had an extended discussion with the patient reviewing all relevant studies completed to date and  lasting 25  minutes of a 40 minute acute visit    Each maintenance medication was reviewed in detail including most importantly the  difference between maintenance and prns and under what circumstances the prns are to be triggered using an action plan format that is not reflected in the computer generated alphabetically organized AVS but trather by a customized med calendar that reflects the AVS meds with confirmed 100% correlation.   Please see instructions for details which were reviewed in writing and the patient given a copy highlighting the part that I personally wrote and discussed at today's ov.

## 2015-12-13 LAB — ALLERGY FULL PROFILE
Allergen, D pternoyssinus,d7: 1.18 kU/L — ABNORMAL HIGH
Allergen,Goose feathers, e70: <0.1 kU/L
Aspergillus fumigatus, m3: 0.14 kU/L — ABNORMAL HIGH
BAHIA GRASS: 5.48 kU/L — AB
BOX ELDER: 0.61 kU/L — AB
Bermuda Grass: 2.92 kU/L — ABNORMAL HIGH
Candida Albicans: 0.3 kU/L — ABNORMAL HIGH
Cat Dander: 1.3 kU/L — ABNORMAL HIGH
Common Ragweed: 0.24 kU/L — ABNORMAL HIGH
Curvularia lunata: <0.1 kU/L
D. farinae: 1.88 kU/L — ABNORMAL HIGH
DOG DANDER: 13.5 kU/L — AB
ELM IGE: 0.3 kU/L — AB
FESCUE: 7.23 kU/L — AB
G005 RYE, PERENNIAL: 7.24 kU/L — AB
G009 Red Top: 7.27 kU/L — ABNORMAL HIGH
Goldenrod: 0.13 kU/L — ABNORMAL HIGH
HOUSE DUST HOLLISTER: 7.45 kU/L — AB
IgE (Immunoglobulin E), Serum: 243 kU/L — ABNORMAL HIGH (ref <115)
LAMB'S QUARTERS CLASS: 0.17 kU/L — AB
Oak: 0.13 kU/L — ABNORMAL HIGH
PLANTAIN: 0.27 kU/L — AB
SYCAMORE TREE: 0.22 kU/L — AB
Stemphylium Botryosum: <0.1 kU/L
Timothy Grass: 5.66 kU/L — ABNORMAL HIGH

## 2015-12-19 ENCOUNTER — Encounter: Payer: Self-pay | Admitting: Internal Medicine

## 2015-12-19 NOTE — Assessment & Plan Note (Signed)
Off allergy vaccines x7/2010 > highly allergic skin tests per Dr Annamaria Boots > restarted vaccine November 06, 2009  Allergy vaccine 1:50:000 10/30/09; adv to 1:10 11/20/10 GH. 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 nucala candidate??

## 2015-12-29 DIAGNOSIS — M9912 Subluxation complex (vertebral) of thoracic region: Secondary | ICD-10-CM | POA: Diagnosis not present

## 2015-12-29 DIAGNOSIS — M9913 Subluxation complex (vertebral) of lumbar region: Secondary | ICD-10-CM | POA: Diagnosis not present

## 2015-12-29 DIAGNOSIS — M9914 Subluxation complex (vertebral) of sacral region: Secondary | ICD-10-CM | POA: Diagnosis not present

## 2015-12-29 DIAGNOSIS — M5033 Other cervical disc degeneration, cervicothoracic region: Secondary | ICD-10-CM | POA: Diagnosis not present

## 2015-12-29 DIAGNOSIS — M9915 Subluxation complex (vertebral) of pelvic region: Secondary | ICD-10-CM | POA: Diagnosis not present

## 2015-12-29 DIAGNOSIS — M9911 Subluxation complex (vertebral) of cervical region: Secondary | ICD-10-CM | POA: Diagnosis not present

## 2015-12-29 DIAGNOSIS — M5136 Other intervertebral disc degeneration, lumbar region: Secondary | ICD-10-CM | POA: Diagnosis not present

## 2016-01-08 ENCOUNTER — Encounter: Payer: Self-pay | Admitting: Adult Health

## 2016-01-08 ENCOUNTER — Ambulatory Visit (INDEPENDENT_AMBULATORY_CARE_PROVIDER_SITE_OTHER): Payer: Medicare Other | Admitting: Adult Health

## 2016-01-08 ENCOUNTER — Telehealth: Payer: Self-pay | Admitting: Internal Medicine

## 2016-01-08 VITALS — BP 130/88 | HR 78 | Temp 97.9°F | Ht 68.0 in | Wt 197.0 lb

## 2016-01-08 DIAGNOSIS — J069 Acute upper respiratory infection, unspecified: Secondary | ICD-10-CM

## 2016-01-08 DIAGNOSIS — J4550 Severe persistent asthma, uncomplicated: Secondary | ICD-10-CM

## 2016-01-08 MED ORDER — AZITHROMYCIN 250 MG PO TABS: ORAL_TABLET | ORAL | Status: AC

## 2016-01-08 MED ORDER — ALBUTEROL SULFATE HFA 108 (90 BASE) MCG/ACT IN AERS: INHALATION_SPRAY | RESPIRATORY_TRACT | Status: DC

## 2016-01-08 NOTE — Assessment & Plan Note (Signed)
Flare  Plan  Zpack take as directed.  Refer to Dr. Annamaria Boots for allergy .  Continue on current regimen .  Follow med calendar closely and bring to each visit.  Follow up Dr. Melvyn Novas in 3 months and As needed   Please contact office for sooner follow up if symptoms do not improve or worsen or seek emergency care

## 2016-01-08 NOTE — Progress Notes (Signed)
Chart and office note reviewed in detail  > agree with a/p as outlined    

## 2016-01-08 NOTE — Assessment & Plan Note (Signed)
Frequent flare  Patient's medications were reviewed today and patient education was given. Computerized medication calendar was adjusted/completed  Allergy labs showed very positive RAST and elevated IgE and eosinophils.  Will refer to allergy.   Plan  Zpack take as directed.  Refer to Dr. Annamaria Boots for allergy .  Continue on current regimen .  Follow med calendar closely and bring to each visit.  Follow up Dr. Melvyn Novas in 3 months and As needed   Please contact office for sooner follow up if symptoms do not improve or worsen or seek emergency care

## 2016-01-08 NOTE — Progress Notes (Signed)
Subjective:   Patient ID: [name]    DOB: 1941-01-26      Brief patient profile: 74 yobm denies ever smoking but has lifelong asthma with severe chronic pattern and best FEV1 of around 2.2 liters documented 2002 followed in pulmonary for primary care also for hbp and hyperlipidemia.    History of Present Illness    07/30/13 Allergy eval rx completed x 4 years > d/c'd and f/u allergy prn    07/25/2014 f/u ov/Wert re: severe chronic asthma/hbp/gerd  Chief Complaint  Patient presents with  . Annual Exam    Pt is fasting. He states still having some increased wheezing and dyspnea since last visit- relates to hot weather. Using rescue inhaler daily.  Has used neb x 1 since last visit.   >>prevnar /TD vaccine     04/03/2015 Follow up : chronic asthma/ HTN/non adherence / relies on samples/ no formulary / has med calendar  Pt returns for 1 month follow up .  Doing well with no asthma flare , on Dulera . Marland Kitchen  Need to establish with dentist > multiple teeth extracted       11/02/2015  f/u ov/Wert re: chronic asthma/ difficult to control Chief Complaint  Patient presents with  . Follow-up    Pt c/o increased chest congestion, cough with clear mucus and wheeze. Pt denies SOB/CP. Pt also c/o sternum tenderness and possible vein enlargement.    rec Prednisone 10 mg take  4 each am x 2 days,   2 each am x 2 days,  1 each am x 2 days and stop  dulera 200 Take 2 puffs first thing in am and then another 2 puffs about 12 hours later.     12/12/2015 acute extended ov/Wert re: acute exac severe chronic asthma/ maint rx dulera 200 2bid  Chief Complaint  Patient presents with  . Acute Visit    Pt c/o increased wheezing x 4 days. He has minimal cough with blood tinged sputum.    pred helps a lot for sev weeks then gradually worse/ hfa suboptimal / still reliant on samples  >>pred taper and labs   01/08/2016 Follow up : Severe Chronic Asthma  Pt returns for 1 month follow up and med  review  Pt with ashtma flare last ov, tx w / pred taper.  Felt better until last week. Now with cough and congestion with yellow mucus.  Denies any SOB, chest tightness/congestion, sinus pressure/drainage, fever, nausea or vomiting.  Reviewed his meds and updated med calendar .  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  CBC w/ diff showed elevated eosinophils.  We discussed allergy referral .      Current Medications, Allergies, Complete Past Medical History, Past Surgical History, Family History, and Social History were reviewed in [employer] record.  ROS  The following are not active complaints unless bolded sore throat, dysphagia, dental problems, itching, sneezing,  nasal congestion or excess/ purulent secretions, ear ache,   fever, chills, sweats, unintended wt loss, classically pleuritic or exertional cp,  orthopnea pnd or leg swelling, presyncope, palpitations, abdominal pain, anorexia, nausea, vomiting, diarrhea  or change in bowel or bladder habits, change in stools or urine, dysuria,hematuria,  rash, arthralgias, visual complaints, headache, numbness, weakness or ataxia or problems with walking or coordination,  change in mood/affect or memory.                Past Medical Hx ASTHMA (  ICD-493.90)  --chronic severe asthma with best FEV1 2.2 L documented 01/2001 and  documented non-adherence Mainly lives off samples of inhalers and nose sprays  - HFA 75% June 15, 2009 > 90% August 02, 2009 > 100% December 27, 2009  - Off allergy vaccines x7/2010 > highly allergic skin tests per Dr Annamaria Boots > restarted vaccine November 06, 2009 > stopped  07/2013  SHOULDER PAIN, RIGHT (ICD-719.41)   HYPERTENSION, BORDERLINE (ICD-401.9)  Shingles T 8 Left 9/01  HEALTH MAINTENANCE...............................................................................................Marland KitchenWert  - Colonoscopy 12/1989 , 2011  - dT 07/25/2014  - Pneumovax 3/04 and  05/2009 - Prevnar 07/25/14 -Flu declined October 17, 2008 and September 21, 2009 , declined flu shot 2015  -CPX 07/25/2014  - Meds reviewed with pt education and computerized med calendar adjusted October 19, 2009, February 20, 2010, September 10, 2010 , 05/22/2012 , 03/01/2013, 10/26/2013 , 03/30/2014 . 07/04/2014 ,. 08/22/2014 , 01/16/2015 06/16/2015 , 01/08/2016            Objective:   Physical Exam    amb bm nad  / vital signs reviewed   05/23/2014  201> 201 07/04/2014 > 07/25/14  196>198 08/22/2014 >194 09/19/2014 >198 10/11/2014 > 10/24/2014  196>   01/02/2015 193 > 03/06/2015  198 >198 04/03/2015 > 05/16/2015 196 >  06/02/2015 196 >197 06/16/2015 > 12/12/2015 199>197 01/08/2016      HEENT: nl dentition,  and orophanx. Nl external ear canals without cough reflex, poor dentition - moderate bilateral turbinate non-specific edema pattern   NECK :  without JVD/Nodes/TM/ nl carotid upstrokes bilaterally   LUNGS: no acc muscle use, hyperresonant to percussion,  Distant BS bilaterally  With mid exp wheezes    CV:  RRR  no s3 or murmur or increase in P2, no edema   ABD:  soft and nontender with nl excursion in the supine position. No bruits or organomegaly, bowel sounds nl  MS:  Nl gait, warm without deformities, nl gait, no calf tenderness, cyanosis or clubbing        CXR PA and Lateral:   12/12/2015 :      Mild chronic interstitial prominence with minimal bibasilar scarring. There is no evidence of pneumonia, CHF, or other acute cardiopulmonary abnormality.   12/12/2015  = allergy profile/ cbc with diff   >IgE at 243, very positive RAST  Eosinophils 7.5 Assessment & Plan:

## 2016-01-08 NOTE — Patient Instructions (Addendum)
Zpack take as directed.  Refer to Dr. Annamaria Boots for allergy .  Continue on current regimen .  Follow med calendar closely and bring to each visit.  Follow up Dr. Melvyn Novas in 3 months and As needed   Please contact office for sooner follow up if symptoms do not improve or worsen or seek emergency care

## 2016-01-08 NOTE — Addendum Note (Signed)
Addended by: Osa Craver on: 01/08/2016 03:52 PM   Modules accepted: Orders

## 2016-01-09 NOTE — Addendum Note (Signed)
Addended by: Osa Craver on: 01/09/2016 02:54 PM   Modules accepted: Medications

## 2016-01-09 NOTE — Telephone Encounter (Signed)
Yes

## 2016-01-09 NOTE — Telephone Encounter (Signed)
Pt seen by TP on 01-08-16; referred to [facility] for allergy based on positive RAST with elevated IgE and eosinophils. Pt currently see's MW in our office.   CY, next opening for allergy consult okay? Thanks.

## 2016-01-10 NOTE — Telephone Encounter (Signed)
[name] spoke with patient and scheduled ALC. Nothing more needed at this time.

## 2016-01-10 NOTE — Telephone Encounter (Signed)
LMTCB

## 2016-01-11 ENCOUNTER — Other Ambulatory Visit: Payer: Self-pay | Admitting: Adult Health

## 2016-01-18 DIAGNOSIS — M9915 Subluxation complex (vertebral) of pelvic region: Secondary | ICD-10-CM | POA: Diagnosis not present

## 2016-01-18 DIAGNOSIS — M5136 Other intervertebral disc degeneration, lumbar region: Secondary | ICD-10-CM | POA: Diagnosis not present

## 2016-01-18 DIAGNOSIS — M9912 Subluxation complex (vertebral) of thoracic region: Secondary | ICD-10-CM | POA: Diagnosis not present

## 2016-01-18 DIAGNOSIS — M9911 Subluxation complex (vertebral) of cervical region: Secondary | ICD-10-CM | POA: Diagnosis not present

## 2016-01-18 DIAGNOSIS — M5033 Other cervical disc degeneration, cervicothoracic region: Secondary | ICD-10-CM | POA: Diagnosis not present

## 2016-01-18 DIAGNOSIS — M9913 Subluxation complex (vertebral) of lumbar region: Secondary | ICD-10-CM | POA: Diagnosis not present

## 2016-01-18 DIAGNOSIS — M9914 Subluxation complex (vertebral) of sacral region: Secondary | ICD-10-CM | POA: Diagnosis not present

## 2016-01-24 DIAGNOSIS — M9912 Subluxation complex (vertebral) of thoracic region: Secondary | ICD-10-CM | POA: Diagnosis not present

## 2016-01-24 DIAGNOSIS — M9914 Subluxation complex (vertebral) of sacral region: Secondary | ICD-10-CM | POA: Diagnosis not present

## 2016-01-24 DIAGNOSIS — M9915 Subluxation complex (vertebral) of pelvic region: Secondary | ICD-10-CM | POA: Diagnosis not present

## 2016-01-24 DIAGNOSIS — M5033 Other cervical disc degeneration, cervicothoracic region: Secondary | ICD-10-CM | POA: Diagnosis not present

## 2016-01-24 DIAGNOSIS — M9913 Subluxation complex (vertebral) of lumbar region: Secondary | ICD-10-CM | POA: Diagnosis not present

## 2016-01-24 DIAGNOSIS — M5136 Other intervertebral disc degeneration, lumbar region: Secondary | ICD-10-CM | POA: Diagnosis not present

## 2016-01-24 DIAGNOSIS — M9911 Subluxation complex (vertebral) of cervical region: Secondary | ICD-10-CM | POA: Diagnosis not present

## 2016-02-02 ENCOUNTER — Ambulatory Visit: Payer: Medicare Other | Admitting: Internal Medicine

## 2016-02-15 ENCOUNTER — Telehealth: Payer: Self-pay | Admitting: Internal Medicine

## 2016-02-15 MED ORDER — AZITHROMYCIN 250 MG PO TABS: ORAL_TABLET | ORAL | Status: DC

## 2016-02-15 NOTE — Telephone Encounter (Signed)
Zpak, ov prn

## 2016-02-15 NOTE — Telephone Encounter (Signed)
Called spoke with pt and is aware of recs. RX sent in.

## 2016-02-15 NOTE — Telephone Encounter (Signed)
Per 12/12/15 OV: Patient Instructions       Work on inhaler technique:  relax and gently blow all the way out then take a nice smooth deep breath back in, triggering the inhaler at same time you start breathing in.  Hold for up to 5 seconds if you can. Blow out thru nose. Rinse and gargle with water when done Prednisone 10 mg take  4 each am x 2 days,   2 each am x 2 days,  1 each am x 2 days and stop  Please remember to go to the lab and x-ray department downstairs for your tests - we will call you with the results when they are available. Please schedule a follow up office visit in 2 weeks, sooner if needed to see [name]  for new med calendar and possible referral back to allergy ? nucala?  -----  Called spoke with pt. He reports he has developed a prod cough (green phlem), slight PND/nasal cong. Denies any wheezing and no chest tx. Reports this started when the weather started to change. Pt has pending appt 04/08/16 Wants something called in. Please advise MW thanks

## 2016-02-16 ENCOUNTER — Ambulatory Visit (INDEPENDENT_AMBULATORY_CARE_PROVIDER_SITE_OTHER): Payer: Medicare Other | Admitting: Internal Medicine

## 2016-02-16 ENCOUNTER — Encounter: Payer: Self-pay | Admitting: Internal Medicine

## 2016-02-16 VITALS — BP 134/82 | HR 83 | Ht 67.5 in | Wt 194.0 lb

## 2016-02-16 DIAGNOSIS — J309 Allergic rhinitis, unspecified: Secondary | ICD-10-CM

## 2016-02-16 DIAGNOSIS — R2240 Localized swelling, mass and lump, unspecified lower limb: Secondary | ICD-10-CM

## 2016-02-16 DIAGNOSIS — J4550 Severe persistent asthma, uncomplicated: Secondary | ICD-10-CM | POA: Diagnosis not present

## 2016-02-16 DIAGNOSIS — J3089 Other allergic rhinitis: Secondary | ICD-10-CM

## 2016-02-16 DIAGNOSIS — J302 Other seasonal allergic rhinitis: Secondary | ICD-10-CM

## 2016-02-16 MED ORDER — PREDNISONE 10 MG PO TABS: ORAL_TABLET | ORAL | Status: DC

## 2016-02-16 NOTE — Progress Notes (Signed)
Subjective:   Patient ID: [name]    DOB: [birth year]      Brief patient profile: 74 yobm denies ever smoking but has lifelong asthma with severe chronic pattern and best FEV1 of around 2.2 liters documented 2002 followed in pulmonary for primary care also for hbp and hyperlipidemia.    History of Present Illness    07/30/13 Allergy eval rx completed x 4 years > d/c'd and f/u allergy prn    07/25/2014 f/u ov/[name] re: severe chronic asthma/hbp/gerd  Chief Complaint  Patient presents with  . Annual Exam    Pt is fasting. He states still having some increased wheezing and dyspnea since last visit- relates to hot weather. Using rescue inhaler daily.  Has used neb x 1 since last visit.   >>prevnar /TD vaccine     04/03/2015 Follow up : chronic asthma/ HTN/non adherence / relies on samples/ no formulary / has med calendar  Pt returns for 1 month follow up .  Doing well with no asthma flare , on Dulera . Marland Kitchen  Need to establish with dentist > multiple teeth extracted       11/02/2015  f/u ov/[name] re: chronic asthma/ difficult to control Chief Complaint  Patient presents with  . Follow-up    Pt c/o increased chest congestion, cough with clear mucus and wheeze. Pt denies SOB/CP. Pt also c/o sternum tenderness and possible vein enlargement.    rec Prednisone 10 mg take  4 each am x 2 days,   2 each am x 2 days,  1 each am x 2 days and stop  dulera 200 Take 2 puffs first thing in am and then another 2 puffs about 12 hours later.     12/12/2015 acute extended ov/[name] re: acute exac severe chronic asthma/ maint rx dulera 200 2bid  Chief Complaint  Patient presents with  . Acute Visit    Pt c/o increased wheezing x 4 days. He has minimal cough with blood tinged sputum.    pred helps a lot for sev weeks then gradually worse/ hfa suboptimal / still reliant on samples  >>pred taper and labs   01/08/2016 NP Follow up : Severe Chronic Asthma  Pt returns for 1 month follow up and med  review  Pt with asthma flare last ov, tx w / pred taper.  Felt better until last week. Now with cough and congestion with yellow mucus.  Denies any SOB, chest tightness/congestion, sinus pressure/drainage, fever, nausea or vomiting.  Reviewed his meds and updated med calendar .  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  CBC w/ diff showed elevated eosinophils.  rec Zpack take as directed.  Refer to Dr. Annamaria Boots for allergy .  Continue on current regimen .  Follow med calendar closely and bring to each visit.    02/16/2016 acute extended ov/[name] re: severe asthma/ maint on dulera 200  Chief Complaint  Patient presents with  . Acute Visit    Pt c/o "knot" in his right lower leg x 2 days. He states he first noticed this 2 days ago when he woke up and his leg was hurting.   breathing worse x 1st of year / occ now needing neb R Leg pains all gone now and no swelling p rx  With nsaids  No obvious day to day or daytime variability or assoc excess/ purulent sputum or mucus plugs or hemoptysis or cp or chest tightness, subjective wheeze or overt  sinus or hb symptoms. No unusual exp hx or h/o childhood pna/ asthma or knowledge of premature birth.  Sleeping ok without nocturnal  or early am exacerbation  of respiratory  c/o's or need for noct saba. Also denies any obvious fluctuation of symptoms with weather or environmental changes or other aggravating or alleviating factors except as outlined above   Current Medications, Allergies, Complete Past Medical History, Past Surgical History, Family History, and Social History were reviewed in [employer] record.  ROS  The following are not active complaints unless bolded sore throat, dysphagia, dental problems, itching, sneezing,  nasal congestion or excess/ purulent secretions, ear ache,   fever, chills, sweats, unintended wt loss, classically pleuritic or exertional cp,  orthopnea pnd or leg swelling,  presyncope, palpitations, abdominal pain, anorexia, nausea, vomiting, diarrhea  or change in bowel or bladder habits, change in stools or urine, dysuria,hematuria,  rash, arthralgias, visual complaints, headache, numbness, weakness or ataxia or problems with walking or coordination,  change in mood/affect or memory.             Past Medical Hx ASTHMA (ICD-493.90)  --chronic severe asthma with best FEV1 2.2 L documented 01/2001 and  documented non-adherence Mainly lives off samples of inhalers and nose sprays  - HFA 75% June 15, 2009 > 90% August 02, 2009 > 100% December 27, 2009  - Off allergy vaccines x7/2010 > highly allergic skin tests per Dr Annamaria Boots > restarted vaccine November 06, 2009 > stopped  07/2013  SHOULDER PAIN, RIGHT (ICD-719.41)   HYPERTENSION, BORDERLINE (ICD-401.9)  Shingles T 8 Left 9/01  HEALTH MAINTENANCE...............................................................................................Marland KitchenWert  - Colonoscopy 12/1989 , 2011  - dT 07/25/2014  - Pneumovax 3/04 and 05/2009 - Prevnar 07/25/14 -Flu declined October 17, 2008 and September 21, 2009 , declined flu shot 2015  -CPX 07/25/2014  - Meds reviewed with pt education and computerized med calendar adjusted October 19, 2009, February 20, 2010, September 10, 2010 , 05/22/2012 , 03/01/2013, 10/26/2013 , 03/30/2014 . 07/04/2014 ,. 08/22/2014 , 01/16/2015 06/16/2015 , 01/08/2016            Objective:   Physical Exam    amb bm nad  / vital signs reviewed   05/23/2014  201> 201 07/04/2014 > 07/25/14  196>198 08/22/2014 >194 09/19/2014 >198 10/11/2014 > 10/24/2014  196>   01/02/2015 193 > 03/06/2015  198 >198 04/03/2015 > 05/16/2015 196 >  06/02/2015 196 >197 06/16/2015 > 12/12/2015 199> 197 01/08/2016 >   02/16/2016     194      HEENT: nl dentition,  and orophanx. Nl external ear canals without cough reflex, poor dentition - moderate bilateral turbinate non-specific edema pattern   NECK :  without JVD/Nodes/TM/ nl carotid upstrokes  bilaterally   LUNGS: no acc muscle use, hyperresonant to percussion,  Distant BS bilaterally  With mid exp wheezes    CV:  RRR  no s3 or murmur or increase in P2, no edema   ABD:  soft and nontender with nl excursion in the supine position. No bruits or organomegaly, bowel sounds nl  MS:  Nl gait, warm without deformities, nl gait, no calf tenderness, cyanosis or clubbing - neg homan's         CXR PA and Lateral:   12/12/2015 :      Mild chronic interstitial prominence with minimal bibasilar scarring. There is no evidence of pneumonia, CHF, or other acute cardiopulmonary abnormality.   12/12/2015  = allergy profile/ cbc with diff   >IgE at  243, very positive RAST  Eosinophils 7.5 Assessment & Plan:

## 2016-02-16 NOTE — Patient Instructions (Signed)
Any time your breathing is getting worse to point of needing the nebulizer > take prednisone 10 mg x 2 each am until better then 1 daily x 3 days and stop   See calendar for specific medication instructions and bring it back for each and every office visit for every healthcare provider you see.  Without it,  you may not receive the best quality medical care that we feel you deserve.  You will note that the calendar groups together  your maintenance  medications that are timed at particular times of the day.  Think of this as your checklist for what your doctor has instructed you to do until your next evaluation to see what benefit  there is  to staying on a consistent group of medications intended to keep you well.  The other group at the bottom is entirely up to you to use as you see fit  for specific symptoms that may arise between visits that require you to treat them on an as needed basis.  Think of this as your action plan or "what if" list.   Separating the top medications from the bottom group is fundamental to providing you adequate care going forward.   Please schedule a follow up office visit in 4 weeks, sooner if needed to see [name]

## 2016-02-17 ENCOUNTER — Encounter: Payer: Self-pay | Admitting: Internal Medicine

## 2016-02-17 NOTE — Assessment & Plan Note (Signed)
09/19/2014 R lower leg swelling ? Etiology ? Muscular >refer to ortho   Resolved with nsaid with no evidence dvt

## 2016-02-17 NOTE — Assessment & Plan Note (Signed)
--  chronic severe asthma with best FEV1 2.2 L documented 01/2001 and documented non-adherence Mainly lives off samples of inhalers and nose sprays  - Off allergy vaccines x7/2010 > highly allergic skin tests per Dr Annamaria Boots > restart vaccine November 06, 2009>stop 07/30/13 to watch - Spirometry on "good day" 01/14/12 =  FEV1 1.54  Ratio 0.58 - 02/16/2016  p extensive coaching HFA effectiveness =    90% from baseline of 75%  - 02/16/2016 added prn prednisone when needing neb  The goal with a chronic steroid dependent illness is always arriving at the lowest effective dose that controls the disease/symptoms and not accepting a set "formula" which is based on statistics or guidelines that don't always take into account patient  variability or the natural hx of the dz in every individual patient, which may well vary over time.  For now therefore I recommend the patient maintain  Off pred chronically but use 20mg  daily when flare and then 10 mg x 3 days and off    Each maintenance medication was reviewed in detail including most importantly the difference between maintenance and as needed and under what circumstances the prns are to be used. This was done in the context of a medication calendar review which provided the patient with a user-friendly unambiguous mechanism for medication administration and reconciliation and provides an action plan for all active problems. It is critical that this be shown to every doctor  for modification during the office visit if necessary so the patient can use it as a working document.

## 2016-02-17 NOTE — Assessment & Plan Note (Signed)
Off allergy vaccines x7/2010 > highly allergic skin tests per Dr Annamaria Boots > restarted vaccine November 06, 2009  Allergy vaccine 1:50:000 10/30/09; adv to 1:10 11/20/10 [facility]. D/Cd 07/30/13 - allergy profile 12/12/15 >  Eos 0.3 /   IgE  243 multiple pos RAST with dog the worst   Referred back to Dr Annamaria Boots ? nucala candidate vs the new Teva equivalent ?

## 2016-02-22 DIAGNOSIS — M5033 Other cervical disc degeneration, cervicothoracic region: Secondary | ICD-10-CM | POA: Diagnosis not present

## 2016-02-22 DIAGNOSIS — M9911 Subluxation complex (vertebral) of cervical region: Secondary | ICD-10-CM | POA: Diagnosis not present

## 2016-02-22 DIAGNOSIS — M9914 Subluxation complex (vertebral) of sacral region: Secondary | ICD-10-CM | POA: Diagnosis not present

## 2016-02-22 DIAGNOSIS — M5136 Other intervertebral disc degeneration, lumbar region: Secondary | ICD-10-CM | POA: Diagnosis not present

## 2016-02-22 DIAGNOSIS — M9915 Subluxation complex (vertebral) of pelvic region: Secondary | ICD-10-CM | POA: Diagnosis not present

## 2016-02-22 DIAGNOSIS — M9901 Segmental and somatic dysfunction of cervical region: Secondary | ICD-10-CM | POA: Diagnosis not present

## 2016-02-22 DIAGNOSIS — M9912 Subluxation complex (vertebral) of thoracic region: Secondary | ICD-10-CM | POA: Diagnosis not present

## 2016-02-22 DIAGNOSIS — M9913 Subluxation complex (vertebral) of lumbar region: Secondary | ICD-10-CM | POA: Diagnosis not present

## 2016-03-10 ENCOUNTER — Emergency Department (HOSPITAL_COMMUNITY): Payer: Medicare Other

## 2016-03-10 ENCOUNTER — Encounter (HOSPITAL_COMMUNITY): Payer: Self-pay | Admitting: *Deleted

## 2016-03-10 ENCOUNTER — Emergency Department (HOSPITAL_COMMUNITY)
Admission: EM | Admit: 2016-03-10 | Discharge: 2016-03-10 | Disposition: A | Discharge status: Home / Self Care | Payer: Medicare Other | Attending: Emergency Medicine | Admitting: Emergency Medicine

## 2016-03-10 DIAGNOSIS — K219 Gastro-esophageal reflux disease without esophagitis: Secondary | ICD-10-CM | POA: Insufficient documentation

## 2016-03-10 DIAGNOSIS — J45901 Unspecified asthma with (acute) exacerbation: Secondary | ICD-10-CM | POA: Insufficient documentation

## 2016-03-10 DIAGNOSIS — Z7951 Long term (current) use of inhaled steroids: Secondary | ICD-10-CM | POA: Diagnosis not present

## 2016-03-10 DIAGNOSIS — J159 Unspecified bacterial pneumonia: Secondary | ICD-10-CM | POA: Insufficient documentation

## 2016-03-10 DIAGNOSIS — R0602 Shortness of breath: Secondary | ICD-10-CM | POA: Diagnosis present

## 2016-03-10 DIAGNOSIS — J189 Pneumonia, unspecified organism: Secondary | ICD-10-CM

## 2016-03-10 DIAGNOSIS — R509 Fever, unspecified: Secondary | ICD-10-CM | POA: Diagnosis not present

## 2016-03-10 DIAGNOSIS — Z79899 Other long term (current) drug therapy: Secondary | ICD-10-CM | POA: Insufficient documentation

## 2016-03-10 DIAGNOSIS — I10 Essential (primary) hypertension: Secondary | ICD-10-CM | POA: Diagnosis not present

## 2016-03-10 DIAGNOSIS — Z86010 Personal history of colonic polyps: Secondary | ICD-10-CM | POA: Diagnosis not present

## 2016-03-10 DIAGNOSIS — Z7982 Long term (current) use of aspirin: Secondary | ICD-10-CM | POA: Insufficient documentation

## 2016-03-10 DIAGNOSIS — Z8619 Personal history of other infectious and parasitic diseases: Secondary | ICD-10-CM | POA: Insufficient documentation

## 2016-03-10 LAB — BASIC METABOLIC PANEL
ANION GAP: 10 (ref 5–15)
BUN: 12 mg/dL (ref 6–20)
CALCIUM: 8.7 mg/dL — AB (ref 8.9–10.3)
CO2: 23 mmol/L (ref 22–32)
Chloride: 108 mmol/L (ref 101–111)
Creatinine, Ser: 1.37 mg/dL — ABNORMAL HIGH (ref 0.61–1.24)
GFR, EST AFRICAN AMERICAN: 57 mL/min — AB (ref >60)
GFR, EST NON AFRICAN AMERICAN: 49 mL/min — AB (ref >60)
Glucose, Bld: 144 mg/dL — ABNORMAL HIGH (ref 65–99)
POTASSIUM: 3.3 mmol/L — AB (ref 3.5–5.1)
Sodium: 141 mmol/L (ref 135–145)

## 2016-03-10 LAB — CBC WITH DIFFERENTIAL/PLATELET
BASOS ABS: 0 10*3/uL (ref 0.0–0.1)
BASOS PCT: 0 %
Eosinophils Absolute: 0.3 10*3/uL (ref 0.0–0.7)
Eosinophils Relative: 3 %
HEMATOCRIT: 42.4 % (ref 39.0–52.0)
HEMOGLOBIN: 13.9 g/dL (ref 13.0–17.0)
LYMPHS PCT: 43 %
Lymphs Abs: 3.7 10*3/uL (ref 0.7–4.0)
MCH: 28.5 pg (ref 26.0–34.0)
MCHC: 32.8 g/dL (ref 30.0–36.0)
MCV: 87.1 fL (ref 78.0–100.0)
Monocytes Absolute: 0.6 10*3/uL (ref 0.1–1.0)
Monocytes Relative: 7 %
NEUTROS ABS: 4 10*3/uL (ref 1.7–7.7)
NEUTROS PCT: 47 %
Platelets: 309 10*3/uL (ref 150–400)
RBC: 4.87 MIL/uL (ref 4.22–5.81)
RDW: 13.4 % (ref 11.5–15.5)
WBC: 8.7 10*3/uL (ref 4.0–10.5)

## 2016-03-10 LAB — I-STAT TROPONIN, ED: Troponin i, poc: 0 ng/mL (ref 0.00–0.08)

## 2016-03-10 MED ORDER — DEXTROSE 5 % IV SOLN
1.0000 g | Freq: Once | INTRAVENOUS | Status: AC
  Administered 2016-03-10: 1 g via INTRAVENOUS
  Filled 2016-03-10: qty 10

## 2016-03-10 MED ORDER — AZITHROMYCIN 250 MG PO TABS: ORAL_TABLET | ORAL | Status: DC

## 2016-03-10 MED ORDER — ALBUTEROL SULFATE (2.5 MG/3ML) 0.083% IN NEBU
5.0000 mg | INHALATION_SOLUTION | Freq: Once | RESPIRATORY_TRACT | Status: AC
  Administered 2016-03-10: 5 mg via RESPIRATORY_TRACT
  Filled 2016-03-10: qty 6

## 2016-03-10 MED ORDER — METHYLPREDNISOLONE SODIUM SUCC 125 MG IJ SOLR
125.0000 mg | Freq: Once | INTRAMUSCULAR | Status: AC
  Administered 2016-03-10: 125 mg via INTRAVENOUS
  Filled 2016-03-10: qty 2

## 2016-03-10 NOTE — Discharge Instructions (Signed)
Zithromax as prescribed.  Return to the emergency department if your symptoms significantly worsen or change.   Community-Acquired Pneumonia, Adult Pneumonia is an infection of the lungs. There are different types of pneumonia. One type can develop while a person is in a hospital. A different type, called community-acquired pneumonia, develops in people who are not, or have not recently been, in the hospital or other health care facility.  CAUSES Pneumonia may be caused by bacteria, viruses, or funguses. Community-acquired pneumonia is often caused by Streptococcus pneumonia bacteria. These bacteria are often passed from one person to another by breathing in droplets from the cough or sneeze of an infected person. RISK FACTORS The condition is more likely to develop in:  People who havechronic diseases, such as chronic obstructive pulmonary disease (COPD), asthma, congestive heart failure, cystic fibrosis, diabetes, or kidney disease.  People who haveearly-stage or late-stage HIV.  People who havesickle cell disease.  People who havehad their spleen removed (splenectomy).  People who havepoor Human resources officer.  People who havemedical conditions that increase the risk of breathing in (aspirating) secretions their own mouth and nose.   People who havea weakened immune system (immunocompromised).  People who smoke.  People whotravel to areas where pneumonia-causing germs commonly exist.  People whoare around animal habitats or animals that have pneumonia-causing germs, including birds, bats, rabbits, cats, and farm animals. SYMPTOMS Symptoms of this condition include:  Adry cough.  A wet (productive) cough.  Fever.  Sweating.  Chest pain, especially when breathing deeply or coughing.  Rapid breathing or difficulty breathing.  Shortness of breath.  Shaking chills.  Fatigue.  Muscle aches. DIAGNOSIS Your health care provider will take a medical history and  perform a physical exam. You may also have other tests, including:  Imaging studies of your chest, including X-rays.  Tests to check your blood oxygen level and other blood gases.  Other tests on blood, mucus (sputum), fluid around your lungs (pleural fluid), and urine. If your pneumonia is severe, other tests may be done to identify the specific cause of your illness. TREATMENT The type of treatment that you receive depends on many factors, such as the cause of your pneumonia, the medicines you take, and other medical conditions that you have. For most adults, treatment and recovery from pneumonia may occur at home. In some cases, treatment must happen in a hospital. Treatment may include:  Antibiotic medicines, if the pneumonia was caused by bacteria.  Antiviral medicines, if the pneumonia was caused by a virus.  Medicines that are given by mouth or through an IV tube.  Oxygen.  Respiratory therapy. Although rare, treating severe pneumonia may include:  Mechanical ventilation. This is done if you are not breathing well on your own and you cannot maintain a safe blood oxygen level.  Thoracentesis. This procedureremoves fluid around one lung or both lungs to help you breathe better. HOME CARE INSTRUCTIONS  Take over-the-counter and prescription medicines only as told by your health care provider.  Only takecough medicine if you are losing sleep. Understand that cough medicine can prevent your body's natural ability to remove mucus from your lungs.  If you were prescribed an antibiotic medicine, take it as told by your health care provider. Do not stop taking the antibiotic even if you start to feel better.  Sleep in a semi-upright position at night. Try sleeping in a reclining chair, or place a few pillows under your head.  Do not use tobacco products, including cigarettes, chewing tobacco, and e-cigarettes.  If you need help quitting, ask your health care provider.  Drink  enough water to keep your urine clear or pale yellow. This will help to thin out mucus secretions in your lungs. PREVENTION There are ways that you can decrease your risk of developing community-acquired pneumonia. Consider getting a pneumococcal vaccine if:  You are older than 76 years of age.  You are older than 76 years of age and are undergoing cancer treatment, have chronic lung disease, or have other medical conditions that affect your immune system. Ask your health care provider if this applies to you. There are different types and schedules of pneumococcal vaccines. Ask your health care provider which vaccination option is best for you. You may also prevent community-acquired pneumonia if you take these actions:  Get an influenza vaccine every year. Ask your health care provider which type of influenza vaccine is best for you.  Go to the dentist on a regular basis.  Wash your hands often. Use hand sanitizer if soap and water are not available. SEEK MEDICAL CARE IF:  You have a fever.  You are losing sleep because you cannot control your cough with cough medicine. SEEK IMMEDIATE MEDICAL CARE IF:  You have worsening shortness of breath.  You have increased chest pain.  Your sickness becomes worse, especially if you are an older adult or have a weakened immune system.  You cough up blood.   This information is not intended to replace advice given to you by your health care provider. Make sure you discuss any questions you have with your health care provider.   Document Released: 12/09/2005 Document Revised: 08/30/2015 Document Reviewed: 04/05/2015 Elsevier Interactive Patient Education [employer].

## 2016-03-10 NOTE — ED Notes (Signed)
Pt reports he woke up this morning feeling short of breath, states he was sweating and temp was 102, no meds prior to arrival. Used neb treatment x 1 (hx of asthma) without relief.

## 2016-03-10 NOTE — ED Provider Notes (Signed)
CSN: [id number]     Arrival date & time 03/10/16  0340 History   First MD Initiated Contact with Patient 03/10/16 0422     Chief Complaint  Patient presents with  . Shortness of Breath     (Consider location/radiation/quality/duration/timing/severity/associated sxs/prior Treatment) HPI Comments: Patient is a 75 year old male with history of asthma. He presents for evaluation of difficulty breathing. He went to bed last night feeling well, then woke up approximately one hour prior to presentation with complaints of difficulty breathing. His wife took his temperature and said it was 102. She then brought him here for evaluation. He denies any chest pain. He denies any leg swelling. He denies any history of any cardiac problems.  Patient is a 75 y.o. male presenting with shortness of breath. The history is provided by the patient.  Shortness of Breath Severity:  Moderate Onset quality:  Sudden Duration:  1 hour Timing:  Constant Progression:  Partially resolved Chronicity:  New Relieved by:  Nothing Worsened by:  Nothing tried Ineffective treatments:  None tried Associated symptoms: cough and fever   Associated symptoms: no chest pain     Past Medical History  Diagnosis Date  . Unspecified asthma(493.90)   . Dysphagia   . Cataract   . Acid reflux   . Borderline hypertension   . Shingles   . Urinary frequency   . Shoulder pain, right   . Personal history of colonic polyps 12/1989  . Esophageal stricture   . GERD (gastroesophageal reflux disease)   . Asthma 06/18/2011  . Colon polyps 2011    1 Hyperplastic and 1 Tubular Adenoma   . Hx of adenomatous colonic polyps 06/25/2015   Past Surgical History  Procedure Laterality Date  . Hernia repair    . Hemorrhoid surgery    . Tonsillectomy    . Esophagogastroduodenoscopy (egd) with propofol N/A 02/07/2015    Procedure: ESOPHAGOGASTRODUODENOSCOPY (EGD) WITH PROPOFOL;  Surgeon: Gatha Mayer, MD;  Location: [facility];  Service:  Endoscopy;  Laterality: N/A;  . Balloon dilation N/A 02/07/2015    Procedure: BALLOON DILATION;  Surgeon: Gatha Mayer, MD;  Location: [facility];  Service: Endoscopy;  Laterality: N/A;   Family History  Problem Relation Age of Onset  . Heart disease Mother   . Kidney failure Sister   . Breast cancer Sister   . Lung cancer Brother   . Brain cancer Sister   . Asthma Sister   . Colon cancer Neg Hx    Social History  Substance Use Topics  . Smoking status: Never Smoker   . Smokeless tobacco: Never Used  . Alcohol Use: No    Review of Systems  Constitutional: Positive for fever.  Respiratory: Positive for cough and shortness of breath.   Cardiovascular: Negative for chest pain.  All other systems reviewed and are negative.     Allergies  Review of patient's allergies indicates no known allergies.  Home Medications   Prior to Admission medications   Medication Sig Start Date End Date Taking? Authorizing Provider  albuterol (PROAIR HFA) 108 (90 Base) MCG/ACT inhaler 2 puffs every 4 hours as needed only  if your can't catch your breath Patient taking differently: 2 puffs every 4 hours as needed for wheezing ((PLANB)) 01/08/16   Tammy S Parrett, NP  albuterol (PROVENTIL) (2.5 MG/3ML) 0.083% nebulizer solution USE 1 VIAL IN NEBULIZER EVERY 4 HOURS AS NEEDED 01/12/16   Tammy S Parrett, NP  aspirin 81 MG EC tablet Take 81 mg by  mouth every morning.     Historical Provider, MD  azithromycin (ZITHROMAX) 250 MG tablet Take as directed 02/15/16   Tanda Rockers, MD  famotidine (PEPCID) 20 MG tablet Take 20 mg by mouth at bedtime.     Historical Provider, MD  guaifenesin (MUCUS RELIEF) 400 MG TABS tablet 2-3 every 12 hours as needed for thick mucus/congestion/cough    Historical Provider, MD  mometasone (NASONEX) 50 MCG/ACT nasal spray Place 1 spray into the nose 2 (two) times daily. 11/21/14 12/09/16  Tammy S Parrett, NP  mometasone-formoterol (DULERA) 200-5 MCG/ACT AERO Inhale 2 puffs  into the lungs 2 (two) times daily. 11/21/14   Tammy S Parrett, NP  Multiple Vitamins-Minerals (CENTRUM SILVER) tablet Take 1 tablet by mouth every morning.     Historical Provider, MD  Naproxen Sodium (ALEVE) 220 MG CAPS Per bottle as needed for joint pain    Historical Provider, MD  olmesartan (BENICAR) 40 MG tablet Take 1/2 tablet by mouth every morning    Historical Provider, MD  oxymetazoline (AFRIN) 0.05 % nasal spray Place 2 sprays into both nostrils 2 (two) times daily as needed for congestion (x5 days, off x2 weeks for naal stuffiness).     Historical Provider, MD  pantoprazole (PROTONIX) 40 MG tablet Take 1 tablet (40 mg total) by mouth daily. Patient taking differently: Take 40 mg by mouth daily before breakfast.  01/31/15   Cecille Rubin P Hvozdovic, PA-C  predniSONE (DELTASONE) 10 MG tablet Take  2 daily until better, then 1 daily x 3 days and stop 02/16/16   Tanda Rockers, MD  promethazine-codeine (PHENERGAN WITH CODEINE) 6.25-10 MG/5ML syrup Take 2 tsp every 4 hours as needed for severe cough    Historical Provider, MD  Respiratory Therapy Supplies (FLUTTER) DEVI Use every 4 hours as needed for cough and congestion    Historical Provider, MD  Simethicone (GAS-X EXTRA STRENGTH) 125 MG CAPS Per box as needed for gas and bloating    Historical Provider, MD   BP 137/89 mmHg  Pulse 102  Temp(Src) 97.6 F (36.4 C) (Oral)  Resp 24  SpO2 89% Physical Exam  Constitutional: He is oriented to person, place, and time. He appears well-developed and well-nourished. No distress.  HENT:  Head: Normocephalic and atraumatic.  Neck: Normal range of motion. Neck supple.  Cardiovascular: Normal rate, regular rhythm and normal heart sounds.   No murmur heard. Pulmonary/Chest: Effort normal. No respiratory distress. He has no rales.  There are slight expiratory rhonchi bilaterally.  Abdominal: Soft. Bowel sounds are normal. He exhibits no distension. There is no tenderness. There is no rebound.   Musculoskeletal: Normal range of motion. He exhibits no edema or tenderness.  Neurological: He is alert and oriented to person, place, and time.  Skin: Skin is warm and dry. He is not diaphoretic.  Nursing note and vitals reviewed.   ED Course  Procedures (including critical care time) Labs Review Labs Reviewed  BASIC METABOLIC PANEL  CBC WITH DIFFERENTIAL/PLATELET  BRAIN NATRIURETIC PEPTIDE  I-STAT [place], ED    Imaging Review No results found. I have personally reviewed and evaluated these images and lab results as part of my medical decision-making.   EKG Interpretation   Date/Time:  Sunday March 10 2016 04:12:28 EDT Ventricular Rate:  98 PR Interval:  169 QRS Duration: 62 QT Interval:  407 QTC Calculation: 520 R Axis:   52 Text Interpretation:  Sinus rhythm Nonspecific T abnormalities, diffuse  leads Prolonged QT interval Confirmed by [facility]  MD, Nathaneil Canary ([id number]) on  03/10/2016 4:26:40 AM      MDM   Final diagnoses:  None    Patient presents with complaints of productive cough, fever, and feeling short of breath upon waking from sleep. His oxygen saturations have been borderline low while in the emergency department, however he is very comfortable and nontoxic appearing. He is in no respiratory distress. His chest x-ray reveals possible bibasilar infiltrates. This will be treated with IV and oral antibiotics. Clinically he appears well and I believe will do well if discharged. I've discussed this with him and the patient is comfortable with going home. He does understand to return if his symptoms worsen. He was ambulated throughout the department and his saturations never dropped below 92%.    Veryl Speak, MD 03/10/16 [phone]

## 2016-03-14 ENCOUNTER — Encounter: Payer: Self-pay | Admitting: Acute Care

## 2016-03-14 ENCOUNTER — Ambulatory Visit (INDEPENDENT_AMBULATORY_CARE_PROVIDER_SITE_OTHER): Payer: Medicare Other | Admitting: Acute Care

## 2016-03-14 ENCOUNTER — Telehealth: Payer: Self-pay | Admitting: Internal Medicine

## 2016-03-14 VITALS — BP 118/80 | HR 63 | Temp 97.3°F | Ht 68.0 in | Wt 189.0 lb

## 2016-03-14 DIAGNOSIS — I10 Essential (primary) hypertension: Secondary | ICD-10-CM

## 2016-03-14 DIAGNOSIS — J189 Pneumonia, unspecified organism: Secondary | ICD-10-CM | POA: Insufficient documentation

## 2016-03-14 NOTE — Telephone Encounter (Signed)
Suggest NP to see

## 2016-03-14 NOTE — Telephone Encounter (Signed)
Spoke with pt. Feels like his BP is running high. He complains of a headache for the past 4 days. He does not have a way of checking his BP at home. At times he feels like he is running a fever, but he does not have way of checking this either. He is a PCP of MW's. Pt would like recommendations.  No Known Allergies  Current Outpatient Prescriptions on File Prior to Visit  Medication Sig Dispense Refill  . albuterol (PROAIR HFA) 108 (90 Base) MCG/ACT inhaler 2 puffs every 4 hours as needed only  if your can't catch your breath (Patient taking differently: 2 puffs every 4 hours as needed for wheezing ((PLANB))) 1 Inhaler 5  . albuterol (PROVENTIL) (2.5 MG/3ML) 0.083% nebulizer solution USE 1 VIAL IN NEBULIZER EVERY 4 HOURS AS NEEDED 75 mL 3  . aspirin 81 MG EC tablet Take 81 mg by mouth every morning.     Marland Kitchen azithromycin (ZITHROMAX Z-PAK) 250 MG tablet 2 po day one, then 1 daily x 4 days 6 tablet 0  . famotidine (PEPCID) 20 MG tablet Take 20 mg by mouth at bedtime.     Marland Kitchen guaifenesin (MUCUS RELIEF) 400 MG TABS tablet 2-3 every 12 hours as needed for thick mucus/congestion/cough    . mometasone (NASONEX) 50 MCG/ACT nasal spray Place 1 spray into the nose 2 (two) times daily. 7.5 g 0  . mometasone-formoterol (DULERA) 200-5 MCG/ACT AERO Inhale 2 puffs into the lungs 2 (two) times daily. 1 Inhaler 0  . Multiple Vitamins-Minerals (CENTRUM SILVER) tablet Take 1 tablet by mouth every morning.     . Naproxen Sodium (ALEVE) 220 MG CAPS Per bottle as needed for joint pain    . olmesartan (BENICAR) 40 MG tablet Take 1/2 tablet by mouth every morning    . oxymetazoline (AFRIN) 0.05 % nasal spray Place 2 sprays into both nostrils 2 (two) times daily as needed for congestion (x5 days, off x2 weeks for naal stuffiness).     . pantoprazole (PROTONIX) 40 MG tablet Take 1 tablet (40 mg total) by mouth daily. (Patient taking differently: Take 40 mg by mouth daily before breakfast. ) 90 tablet 3  . predniSONE  (DELTASONE) 10 MG tablet Take  2 daily until better, then 1 daily x 3 days and stop 100 tablet 0  . promethazine-codeine (PHENERGAN WITH CODEINE) 6.25-10 MG/5ML syrup Take 2 tsp every 4 hours as needed for severe cough    . Respiratory Therapy Supplies (FLUTTER) DEVI Use every 4 hours as needed for cough and congestion    . Simethicone (GAS-X EXTRA STRENGTH) 125 MG CAPS Per box as needed for gas and bloating     No current facility-administered medications on file prior to visit.    CY - please advise as MW is unavailable. Thanks.

## 2016-03-14 NOTE — Progress Notes (Signed)
Subjective:    Patient ID: [name], male    DOB: [birth year], 75 y.o.   MRN: [id number]  HPI 34 yobm denies ever smoking but has lifelong asthma with severe chronic pattern and best FEV1 of around 2.2 liters documented 2002 followed in pulmonary for primary care also for hbp and hyperlipidemia.  Significant Events/Procedures:  03/10/16: ED Visit>>> Bibasilar pneumonia>>>Treated with Z Pack per ED.  IMPRESSION: Bibasilar airspace opacities, suspicious for pneumonia in this clinical setting. Followup PA and lateral chest X-ray is recommended in 3-4 weeks following trial of antibiotic therapy to ensure resolution and exclude underlying malignancy.  03/14/16: Acute Office Visit:  [name] presents to the office today because he is still feeling tired and not quite back to normal after diagnosis of pneumonia this past Sunday 03/10/16.He denies fever, chest pain, orthopnea, or hemoptysis. We discussed that recovery from pneumonia takes several weeks, and that we will recheck an xray in 3-4 weeks . He has completed his Z-Pack today.He was also concerned that his blood pressure may be high, but this was normal today when checked ( 118/80)   Current outpatient prescriptions:  .  albuterol (PROAIR HFA) 108 (90 Base) MCG/ACT inhaler, 2 puffs every 4 hours as needed only  if your can't catch your breath (Patient taking differently: 2 puffs every 4 hours as needed for wheezing ((PLANB))), Disp: 1 Inhaler, Rfl: 5 .  albuterol (PROVENTIL) (2.5 MG/3ML) 0.083% nebulizer solution, USE 1 VIAL IN NEBULIZER EVERY 4 HOURS AS NEEDED, Disp: 75 mL, Rfl: 3 .  aspirin 81 MG EC tablet, Take 81 mg by mouth every morning. , Disp: , Rfl:  .  famotidine (PEPCID) 20 MG tablet, Take 20 mg by mouth at bedtime. , Disp: , Rfl:  .  guaifenesin (MUCUS RELIEF) 400 MG TABS tablet, 2-3 every 12 hours as needed for thick mucus/congestion/cough, Disp: , Rfl:  .  mometasone (NASONEX) 50 MCG/ACT nasal spray, Place 1 spray into  the nose 2 (two) times daily., Disp: 7.5 g, Rfl: 0 .  mometasone-formoterol (DULERA) 200-5 MCG/ACT AERO, Inhale 2 puffs into the lungs 2 (two) times daily., Disp: 1 Inhaler, Rfl: 0 .  Multiple Vitamins-Minerals (CENTRUM SILVER) tablet, Take 1 tablet by mouth every morning. , Disp: , Rfl:  .  Naproxen Sodium (ALEVE) 220 MG CAPS, Per bottle as needed for joint pain, Disp: , Rfl:  .  olmesartan (BENICAR) 40 MG tablet, Take 1/2 tablet by mouth every morning, Disp: , Rfl:  .  oxymetazoline (AFRIN) 0.05 % nasal spray, Place 2 sprays into both nostrils 2 (two) times daily as needed for congestion (x5 days, off x2 weeks for naal stuffiness). , Disp: , Rfl:  .  pantoprazole (PROTONIX) 40 MG tablet, Take 1 tablet (40 mg total) by mouth daily. (Patient taking differently: Take 40 mg by mouth daily before breakfast. ), Disp: 90 tablet, Rfl: 3 .  predniSONE (DELTASONE) 10 MG tablet, Take  2 daily until better, then 1 daily x 3 days and stop (Patient taking differently: 1 daily), Disp: 100 tablet, Rfl: 0 .  promethazine-codeine (PHENERGAN WITH CODEINE) 6.25-10 MG/5ML syrup, Take 2 tsp every 4 hours as needed for severe cough, Disp: , Rfl:  .  Respiratory Therapy Supplies (FLUTTER) DEVI, Use every 4 hours as needed for cough and congestion, Disp: , Rfl:  .  Simethicone (GAS-X EXTRA STRENGTH) 125 MG CAPS, Per box as needed for gas and bloating, Disp: , Rfl:    Past Medical History  Diagnosis Date  .  Unspecified asthma(493.90)   . Dysphagia   . Cataract   . Acid reflux   . Borderline hypertension   . Shingles   . Urinary frequency   . Shoulder pain, right   . Personal history of colonic polyps 12/1989  . Esophageal stricture   . GERD (gastroesophageal reflux disease)   . Asthma 06/18/2011  . Colon polyps 2011    1 Hyperplastic and 1 Tubular Adenoma   . Hx of adenomatous colonic polyps 06/25/2015    No Known Allergies  Review of Systems Constitutional:   No  weight loss, night sweats,  Fevers, chills,  fatigue, or  lassitude.  HEENT:   No headaches,  Difficulty swallowing,  Tooth/dental problems, or  Sore throat,                No sneezing, itching, ear ache, nasal congestion, post nasal drip,   CV:  No chest pain,  Orthopnea, PND, swelling in lower extremities, anasarca, dizziness, palpitations, syncope.   GI  No heartburn, indigestion, abdominal pain, nausea, vomiting, diarrhea, change in bowel habits, loss of appetite, bloody stools.   Resp:slight  shortness of breath with exertion not at rest.  No excess mucus, no productive cough,  No non-productive cough,  No coughing up of blood.  No change in color of mucus.  No wheezing.  No chest wall deformity  Skin: no rash or lesions.  GU: no dysuria, change in color of urine, no urgency or frequency.  No flank pain, no hematuria   MS:  No joint pain or swelling.  No decreased range of motion.  No back pain.  Psych:  No change in mood or affect. No depression or anxiety.  No memory loss.        Objective:   Physical Exam  BP 118/80 mmHg  Pulse 63  Temp(Src) 97.3 F (36.3 C) (Oral)  Ht 5\' 8"  (1.727 m)  Wt 189 lb (85.73 kg)  BMI 28.74 kg/m2  SpO2 95%  Physical Exam:  General- No distress,  A&Ox3 ENT: No sinus tenderness, TM clear, pale nasal mucosa, no oral exudate,no post nasal drip, no LAN Cardiac: S1, S2, regular rate and rhythm, no murmur Chest: No wheeze/ rales/ dullness;, breath sounds diminished per bases, no accessory muscle use, no nasal flaring, no sternal retractions Abd.: Soft Non-tender Ext: No clubbing cyanosis, edema Neuro:  normal strength Skin: No rashes, warm and dry Psych: normal mood and behavior [name], AGACNP-BC [place] Pager # [phone] 03/14/16    Assessment & Plan:

## 2016-03-14 NOTE — Assessment & Plan Note (Signed)
Concerned that his blood pressure may be running high. Todays reading 118/80  Plan: Continue Benicar 40 mg 1/2 tab daily along with life style changes.

## 2016-03-14 NOTE — Progress Notes (Signed)
Chart and office note reviewed in detail along with available xrays/ labs > agree with a/p as outlined  

## 2016-03-14 NOTE — Assessment & Plan Note (Addendum)
Diagnosed 03/10/16 per CXR in ED Treated with Z-Pack Plan: Follow up CXR and appointment with Dr. Melvyn Novas in 3-4 weeks to ensure resolution . Discussed that pneumonia will need more than 4 days to resolve. IT may take several weeks. Please contact office for sooner follow up if symptoms do not improve or worsen or seek emergency care

## 2016-03-14 NOTE — Telephone Encounter (Signed)
Per CDY- needs ov for eval  Judson Roch still has 1 opening  LMTCB

## 2016-03-14 NOTE — Patient Instructions (Addendum)
It is nice to meet you today. I am sorry you are not feeling well. Your blood pressure today is normal. We will schedule you for a chest xray in 3-4 weeks with a follow up appointment at the same time. Rest Drink plenty of fluids You have completed your antibiotic. You can try Mucinex 1-2 tablets every 12 hours to help with chest congestion. Please contact office for sooner follow up if symptoms do not improve or worsen or seek emergency care

## 2016-03-15 ENCOUNTER — Ambulatory Visit: Payer: Medicare Other | Admitting: Adult Health

## 2016-03-15 NOTE — Telephone Encounter (Signed)
Pt was seen yesterday by SG.  Will close message.

## 2016-03-21 ENCOUNTER — Ambulatory Visit: Payer: Medicare Other | Admitting: Adult Health

## 2016-04-08 ENCOUNTER — Ambulatory Visit (INDEPENDENT_AMBULATORY_CARE_PROVIDER_SITE_OTHER): Payer: Medicare Other | Admitting: Internal Medicine

## 2016-04-08 ENCOUNTER — Ambulatory Visit (INDEPENDENT_AMBULATORY_CARE_PROVIDER_SITE_OTHER)
Admission: RE | Admit: 2016-04-08 | Discharge: 2016-04-08 | Disposition: A | Discharge status: Home / Self Care | Payer: Medicare Other | Source: Ambulatory Visit | Attending: Internal Medicine | Admitting: Internal Medicine

## 2016-04-08 ENCOUNTER — Encounter: Payer: Self-pay | Admitting: Internal Medicine

## 2016-04-08 VITALS — BP 144/90 | HR 75 | Ht 68.0 in | Wt 193.0 lb

## 2016-04-08 DIAGNOSIS — J189 Pneumonia, unspecified organism: Secondary | ICD-10-CM

## 2016-04-08 DIAGNOSIS — J4550 Severe persistent asthma, uncomplicated: Secondary | ICD-10-CM

## 2016-04-08 NOTE — Progress Notes (Signed)
Quick Note:  Spoke with pt and notified of results per Dr. Wert. Pt verbalized understanding and denied any questions.  ______ 

## 2016-04-08 NOTE — Progress Notes (Signed)
Subjective:   Patient ID: [name]    DOB: February 10, 1941      Brief patient profile: 74 yobm denies ever smoking but has lifelong asthma with severe chronic pattern and best FEV1 of around 2.2 liters documented 2002 followed in pulmonary for primary care also for hbp and hyperlipidemia.    History of Present Illness    07/30/13 Allergy eval rx completed x 4 years > d/c'd and f/u allergy prn    04/03/2015 Follow up : chronic asthma/ HTN/non adherence / relies on samples/ no formulary / has med calendar  Pt returns for 1 month follow up .  Doing well with no asthma flare , on Dulera . Marland Kitchen  Need to establish with dentist > multiple teeth extracted    03/10/16 ER eval with sob/ fever 102  rx Zpak   03/14/16 NP eval  rec Finish abx    04/08/2016  f/u ov/[name] re: severe chronic asthma/ on dulera 200 2bid  Chief Complaint  Patient presents with  . Follow-up    Feeling well today and denies any co's.   says took neb before going to ER, ER note says otherwise ? When did you last take your nebulizer  A this morning (confused between dulera and alb neb)  Back to baseline just using the dulera 200 / no saba at all in any form Not limited by breathing from desired activities  But very sedentary       No obvious day to day or daytime variability or assoc excess/ purulent sputum or mucus plugs or hemoptysis or cp or chest tightness, subjective wheeze or overt sinus or hb symptoms. No unusual exp hx or h/o childhood pna/ asthma or knowledge of premature birth.  Sleeping ok without nocturnal  or early am exacerbation  of respiratory  c/o's or need for noct saba. Also denies any obvious fluctuation of symptoms with weather or environmental changes or other aggravating or alleviating factors except as outlined above   Current Medications, Allergies, Complete Past Medical History, Past Surgical History, Family History, and Social History were reviewed in [employer]  record.  ROS  The following are not active complaints unless bolded sore throat, dysphagia, dental problems, itching, sneezing,  nasal congestion or excess/ purulent secretions, ear ache,   fever, chills, sweats, unintended wt loss, classically pleuritic or exertional cp,  orthopnea pnd or leg swelling, presyncope, palpitations, abdominal pain, anorexia, nausea, vomiting, diarrhea  or change in bowel or bladder habits, change in stools or urine, dysuria,hematuria,  rash, arthralgias, visual complaints, headache, numbness, weakness or ataxia or problems with walking or coordination,  change in mood/affect or memory.             Past Medical Hx ASTHMA (ICD-493.90)  --chronic severe asthma with best FEV1 2.2 L documented 01/2001 and  documented non-adherence Mainly lives off samples of inhalers and nose sprays  - HFA 75% June 15, 2009 > 90% August 02, 2009 > 100% December 27, 2009  - Off allergy vaccines x7/2010 > highly allergic skin tests per Dr Annamaria Boots > restarted vaccine November 06, 2009 > stopped  07/2013  SHOULDER PAIN, RIGHT (ICD-719.41)   HYPERTENSION, BORDERLINE (ICD-401.9)  Shingles T 8 Left 9/01  HEALTH MAINTENANCE...............................................................................................Marland KitchenWert  - Colonoscopy 12/1989,  2011  - dT 07/25/2014  - Pneumovax 3/04 and 05/2009 - Prevnar 07/25/14 -Flu declined October 17, 2008 and September 21, 2009 , declined flu shot 2015  -CPX 07/25/2014  - Meds reviewed with pt education and computerized  med calendar adjusted last  01/08/2016            Objective:   Physical Exam    amb bm nad  / vital signs reviewed   05/23/2014  201> 201 07/04/2014 > 07/25/14  196>198 08/22/2014 >194 09/19/2014 >198 10/11/2014 > 10/24/2014  196>   01/02/2015 193 > 03/06/2015  198 >198 04/03/2015 > 05/16/2015 196 >  06/02/2015 196 >197 06/16/2015 > 12/12/2015 199> 197 01/08/2016 >   02/16/2016     194 > 04/08/2016  193     HEENT: nl dentition,  and orophanx. Nl  external ear canals without cough reflex, poor dentition - moderate bilateral turbinate non-specific edema pattern   NECK :  without JVD/Nodes/TM/ nl carotid upstrokes bilaterally   LUNGS: no acc muscle use, hyperresonant to percussion,  Distant BS bilaterally with no wheeze/ moderate increased Texp    CV:  RRR  no s3 or murmur or increase in P2, no edema   ABD:  soft and nontender with nl excursion in the supine position. No bruits or organomegaly, bowel sounds nl  MS:  Nl gait, warm without deformities, nl gait, no calf tenderness, cyanosis or clubbing - neg homan's      CXR PA and Lateral:   04/08/2016 :    I personally reviewed images and agree with radiology impression as follows:   Near complete resolution of the bibasilar opacities since prior study.     Assessment & Plan:

## 2016-04-08 NOTE — Patient Instructions (Signed)
Please remember to go to the  x-ray department downstairs for your tests - we will call you with the results when they are available.  See [name]  In 4  weeks with all your medications, even over the counter meds, separated in two separate bags, the ones you take no matter what vs the ones you stop once you feel better and take only as needed when you feel you need them.   Tammy  will generate for you a new user friendly medication calendar that will put [place] all on the same page re: your medication use.

## 2016-04-08 NOTE — Assessment & Plan Note (Signed)
Diagnosed per ED 03/10/2016. Bibasilar Airspace opacities suspicious for pneumonia. rx zpak > f/u cxr 04/08/2016 mostly cleared > no further f/u needed never smoker

## 2016-04-08 NOTE — Assessment & Plan Note (Addendum)
--  chronic severe asthma with best FEV1 2.2 L documented 01/2001 and documented non-adherence Mainly lives off samples of inhalers and nose sprays  - Off allergy vaccines x7/2010 > highly allergic skin tests per Dr Annamaria Boots > restart vaccine November 06, 2009>stop 07/30/13 to watch - Spirometry on "good day" 01/14/12 =  FEV1 1.54  Ratio 0.58  - 02/16/2016 added prn prednisone when needing neb    Still struggling with instructions/ names of meds - The proper method of use, as well as anticipated side effects, of a metered-dose inhaler are discussed and demonstrated to the patient. Improved effectiveness after extensive coaching during this visit to a level of approximately 90 % from a baseline of 75 %     I had an extended discussion with the patient reviewing all relevant studies completed to date and  lasting 15 to 20 minutes of a 25 minute visit    Each maintenance medication was reviewed in detail including most importantly the difference between maintenance and prns and under what circumstances the prns are to be triggered using an action plan format that is not reflected in the computer generated alphabetically organized AVS but trather by a customized med calendar that reflects the AVS meds with confirmed 100% correlation.   Please see instructions for details which were reviewed in writing and the patient given a copy highlighting the part that I personally wrote and discussed at today's ov.

## 2016-04-22 ENCOUNTER — Telehealth: Payer: Self-pay | Admitting: Internal Medicine

## 2016-04-22 ENCOUNTER — Institutional Professional Consult (permissible substitution): Payer: Medicare Other | Admitting: Internal Medicine

## 2016-04-22 NOTE — Telephone Encounter (Signed)
I spoke with patient-he has been RSC'd to Tuesday 04-23-16 at 11:15am. Nothing more needed at this time.

## 2016-04-23 ENCOUNTER — Encounter: Payer: Self-pay | Admitting: Internal Medicine

## 2016-04-23 ENCOUNTER — Other Ambulatory Visit (INDEPENDENT_AMBULATORY_CARE_PROVIDER_SITE_OTHER): Payer: Medicare Other

## 2016-04-23 ENCOUNTER — Ambulatory Visit (INDEPENDENT_AMBULATORY_CARE_PROVIDER_SITE_OTHER): Payer: Medicare Other | Admitting: Internal Medicine

## 2016-04-23 VITALS — BP 120/76 | HR 74 | Ht 68.0 in | Wt 196.0 lb

## 2016-04-23 DIAGNOSIS — J4550 Severe persistent asthma, uncomplicated: Secondary | ICD-10-CM

## 2016-04-23 DIAGNOSIS — J309 Allergic rhinitis, unspecified: Secondary | ICD-10-CM | POA: Diagnosis not present

## 2016-04-23 DIAGNOSIS — J45909 Unspecified asthma, uncomplicated: Secondary | ICD-10-CM | POA: Diagnosis not present

## 2016-04-23 DIAGNOSIS — J3089 Other allergic rhinitis: Secondary | ICD-10-CM

## 2016-04-23 DIAGNOSIS — J302 Other seasonal allergic rhinitis: Secondary | ICD-10-CM

## 2016-04-23 LAB — CBC WITH DIFFERENTIAL/PLATELET
BASOS PCT: 0.7 % (ref 0.0–3.0)
Basophils Absolute: 0.1 10*3/uL (ref 0.0–0.1)
EOS ABS: 0.6 10*3/uL (ref 0.0–0.7)
EOS PCT: 7 % — AB (ref 0.0–5.0)
HCT: 41.3 % (ref 39.0–52.0)
HEMOGLOBIN: 13.7 g/dL (ref 13.0–17.0)
LYMPHS ABS: 3 10*3/uL (ref 0.7–4.0)
Lymphocytes Relative: 35.2 % (ref 12.0–46.0)
MCHC: 33.2 g/dL (ref 30.0–36.0)
MCV: 86.1 fl (ref 78.0–100.0)
Monocytes Absolute: 0.7 10*3/uL (ref 0.1–1.0)
Monocytes Relative: 7.9 % (ref 3.0–12.0)
NEUTROS ABS: 4.2 10*3/uL (ref 1.4–7.7)
NEUTROS PCT: 49.2 % (ref 43.0–77.0)
PLATELETS: 324 10*3/uL (ref 150.0–400.0)
RBC: 4.8 Mil/uL (ref 4.22–5.81)
RDW: 14.6 % (ref 11.5–15.5)
WBC: 8.6 10*3/uL (ref 4.0–10.5)

## 2016-04-23 LAB — NITRIC OXIDE: Nitric Oxide: 32

## 2016-04-23 NOTE — Assessment & Plan Note (Addendum)
He associates exacerbations with season and temperature changes. Allergy markers have been strong. He would qualify for either Xolair or Nucala. I have suggested we try Nucala and he is being given application and patient information. His awareness of symptoms is episodic which can make compliance more difficult.

## 2016-04-23 NOTE — Progress Notes (Signed)
04/23/2016-75 year old male never smoker referred courtesy of Dr. Melvyn Novas- had been on allergy vaccine several years ago under [facility]. Printed labs from 4 months ago. Seasonal and perennial allergic rhinitis, severe persistent asthma Followed in Pulmonary clinic/ [facility]/ [facility]  --chronic severe asthma with best FEV1 2.2 L documented 01/2001 and documented non-adherence Mainly lives off samples of inhalers and nose sprays  - Off allergy vaccines x7/2010 > highly allergic skin tests per Dr Annamaria Boots > restart vaccine November 06, 2009>stop 07/30/13 to watch - Spirometry on "good day" 01/14/12 =  FEV1 1.54  Ratio 0.58 - 02/16/2016  p extensive coaching HFA effectiveness =    90% from baseline of 75%  - 02/16/2016 added prn prednisone when needing neb Allergy Profile 12/12/2015-total IgE 243, broadly positive for all specific allergens listed except molds. CBCs have shown elevated eosinophil counts. CXR 04/08/2016- IMPRESSION: Near complete resolution of the bibasilar opacities since prior study. Electronically Signed  By: Rolm Baptise M.D.  On: 04/08/2016 09:30 Today he feels well. He suggests when this appointment was made earlier in the spring he was acutely ill with what may have been an infection. Using rescue and nebulizer bronchodilators only if needed. At least some wheeze admitted a few days each week with little cough or sleep disturbance. He says he is using Dulera. FENO 04/23/16- 32/elevated CBC with differential 04/23/2016-eosinophils elevated 7.0  Prior to Admission medications   Medication Sig Start Date End Date Taking? Authorizing Provider  albuterol (PROAIR HFA) 108 (90 Base) MCG/ACT inhaler 2 puffs every 4 hours as needed only  if your can't catch your breath Patient taking differently: 2 puffs every 4 hours as needed for wheezing ((PLANB)) 01/08/16  Yes Tammy S Parrett, NP  albuterol (PROVENTIL) (2.5 MG/3ML) 0.083% nebulizer solution USE 1 VIAL IN NEBULIZER EVERY 4 HOURS AS NEEDED  01/12/16  Yes Tammy S Parrett, NP  aspirin 81 MG EC tablet Take 81 mg by mouth every morning.    Yes Historical Provider, MD  famotidine (PEPCID) 20 MG tablet Take 20 mg by mouth at bedtime.    Yes Historical Provider, MD  guaifenesin (MUCUS RELIEF) 400 MG TABS tablet 2-3 every 12 hours as needed for thick mucus/congestion/cough   Yes Historical Provider, MD  mometasone (NASONEX) 50 MCG/ACT nasal spray Place 1 spray into the nose 2 (two) times daily. 11/21/14 12/09/16 Yes Tammy S Parrett, NP  mometasone-formoterol (DULERA) 200-5 MCG/ACT AERO Inhale 2 puffs into the lungs 2 (two) times daily. 11/21/14  Yes Tammy S Parrett, NP  Multiple Vitamins-Minerals (CENTRUM SILVER) tablet Take 1 tablet by mouth every morning.    Yes Historical Provider, MD  Naproxen Sodium (ALEVE) 220 MG CAPS Per bottle as needed for joint pain   Yes Historical Provider, MD  olmesartan (BENICAR) 40 MG tablet Take 1/2 tablet by mouth every morning   Yes Historical Provider, MD  oxymetazoline (AFRIN) 0.05 % nasal spray Place 2 sprays into both nostrils 2 (two) times daily as needed for congestion (x5 days, off x2 weeks for naal stuffiness).    Yes Historical Provider, MD  pantoprazole (PROTONIX) 40 MG tablet Take 1 tablet (40 mg total) by mouth daily. Patient taking differently: Take 40 mg by mouth daily before breakfast.  01/31/15  Yes Lori P Hvozdovic, PA-C  promethazine-codeine (PHENERGAN WITH CODEINE) 6.25-10 MG/5ML syrup Take 2 tsp every 4 hours as needed for severe cough   Yes Historical Provider, MD  Respiratory Therapy Supplies (FLUTTER) DEVI Use every 4 hours as needed for cough and congestion  Yes Historical Provider, MD  Simethicone (GAS-X EXTRA STRENGTH) 125 MG CAPS Per box as needed for gas and bloating   Yes Historical Provider, MD   Past Medical History  Diagnosis Date  . Unspecified asthma(493.90)   . Dysphagia   . Cataract   . Acid reflux   . Borderline hypertension   . Shingles   . Urinary frequency   .  Shoulder pain, right   . Personal history of colonic polyps 12/1989  . Esophageal stricture   . GERD (gastroesophageal reflux disease)   . Asthma 06/18/2011  . Colon polyps 2011    1 Hyperplastic and 1 Tubular Adenoma   . Hx of adenomatous colonic polyps 06/25/2015   Past Surgical History  Procedure Laterality Date  . Hernia repair    . Hemorrhoid surgery    . Tonsillectomy    . Esophagogastroduodenoscopy (egd) with propofol N/A 02/07/2015    Procedure: ESOPHAGOGASTRODUODENOSCOPY (EGD) WITH PROPOFOL;  Surgeon: Gatha Mayer, MD;  Location: [facility];  Service: Endoscopy;  Laterality: N/A;  . Balloon dilation N/A 02/07/2015    Procedure: BALLOON DILATION;  Surgeon: Gatha Mayer, MD;  Location: [facility];  Service: Endoscopy;  Laterality: N/A;   Family History  Problem Relation Age of Onset  . Heart disease Mother   . Kidney failure Sister   . Breast cancer Sister   . Lung cancer Brother   . Brain cancer Sister   . Asthma Sister   . Colon cancer Neg Hx   ' Social History   Social History  . Marital Status: Married    Spouse Name: N/A  . Number of Children: 7  . Years of Education: N/A   Occupational History  . Truck Geophysicist/field seismologist     retired  .     Social History Main Topics  . Smoking status: Never Smoker   . Smokeless tobacco: Never Used  . Alcohol Use: No  . Drug Use: No  . Sexual Activity: Not on file   Other Topics Concern  . Not on file   Social History Narrative   Patient is separated from spouse   ROS-see HPI   Negative unless "+" Constitutional:    weight loss, night sweats, fevers, chills, fatigue, lassitude. HEENT:    headaches, difficulty swallowing, tooth/dental problems, sore throat,       sneezing, itching, ear ache, nasal congestion, post nasal drip, snoring CV:    chest pain, orthopnea, PND, swelling in lower extremities, anasarca,                                                        dizziness, palpitations Resp:   +shortness of breath with  exertion or at rest.                productive cough,   non-productive cough, coughing up of blood.              change in color of mucus.  +wheezing.   Skin:    rash or lesions. GI:  No-   heartburn, indigestion, abdominal pain, nausea, vomiting, diarrhea,                 change in bowel habits, loss of appetite GU: dysuria, change in color of urine, no urgency or frequency.   flank pain. MS:  joint pain, stiffness, decreased range of motion, back pain. Neuro-     nothing unusual Psych:  change in mood or affect.  depression or anxiety.   memory loss.  OBJ- Physical Exam General- Alert, Oriented, Affect-appropriate, Distress- none acute Skin- rash-none, lesions- none, excoriation- none Lymphadenopathy- none Head- atraumatic            Eyes- Gross vision intact, PERRLA, conjunctivae and secretions clear            Ears- Hearing, canals-normal            Nose- Clear, no-Septal dev, mucus, polyps, erosion, perforation             Throat- Mallampati II , mucosa clear , drainage- none, tonsils- atrophic Neck- flexible , trachea midline, no stridor , thyroid nl, carotid no bruit Chest - symmetrical excursion , unlabored           Heart/CV- RRR , no murmur , no gallop  , no rub, nl s1 s2                           - JVD- none , edema- none, stasis changes- none, varices- none           Lung- clear to P&A, wheeze- none, cough- none , dullness-none, rub- none           Chest wall-  Abd-  Br/ Gen/ Rectal- Not done, not indicated Extrem- cyanosis- none, clubbing, none, atrophy- none, strength- nl Neuro- grossly intact to observation

## 2016-04-23 NOTE — Patient Instructions (Signed)
Order- lab- IgE, CBC w dif, FENO      Dx severe persistent asthma  Start application process for Nucala

## 2016-04-23 NOTE — Assessment & Plan Note (Signed)
He is currently denying significant spring pollen rhinitis and exam today is consistent with this

## 2016-04-24 LAB — IGE: IgE (Immunoglobulin E), Serum: 203 kU/L — ABNORMAL HIGH (ref <115)

## 2016-04-30 ENCOUNTER — Other Ambulatory Visit: Payer: Self-pay

## 2016-04-30 MED ORDER — PANTOPRAZOLE SODIUM 40 MG PO TBEC: 40.0000 mg | DELAYED_RELEASE_TABLET | Freq: Every day | ORAL | Status: DC

## 2016-05-03 DIAGNOSIS — M9901 Segmental and somatic dysfunction of cervical region: Secondary | ICD-10-CM | POA: Diagnosis not present

## 2016-05-03 DIAGNOSIS — M5033 Other cervical disc degeneration, cervicothoracic region: Secondary | ICD-10-CM | POA: Diagnosis not present

## 2016-05-06 ENCOUNTER — Encounter: Payer: Self-pay | Admitting: Adult Health

## 2016-05-06 ENCOUNTER — Ambulatory Visit (INDEPENDENT_AMBULATORY_CARE_PROVIDER_SITE_OTHER): Payer: Medicare Other | Admitting: Adult Health

## 2016-05-06 VITALS — BP 110/68 | HR 75 | Temp 97.7°F | Ht 68.0 in | Wt 192.0 lb

## 2016-05-06 DIAGNOSIS — J4550 Severe persistent asthma, uncomplicated: Secondary | ICD-10-CM | POA: Diagnosis not present

## 2016-05-06 DIAGNOSIS — J302 Other seasonal allergic rhinitis: Secondary | ICD-10-CM

## 2016-05-06 DIAGNOSIS — J3089 Other allergic rhinitis: Secondary | ICD-10-CM

## 2016-05-06 DIAGNOSIS — J309 Allergic rhinitis, unspecified: Secondary | ICD-10-CM

## 2016-05-06 MED ORDER — ALBUTEROL SULFATE HFA 108 (90 BASE) MCG/ACT IN AERS: 2.0000 | INHALATION_SPRAY | RESPIRATORY_TRACT | Status: DC | PRN

## 2016-05-06 MED ORDER — MOMETASONE FURO-FORMOTEROL FUM 200-5 MCG/ACT IN AERO: 2.0000 | INHALATION_SPRAY | Freq: Two times a day (BID) | RESPIRATORY_TRACT | Status: DC

## 2016-05-06 MED ORDER — OLMESARTAN MEDOXOMIL-HCTZ 40-12.5 MG PO TABS: 1.0000 | ORAL_TABLET | Freq: Every day | ORAL | Status: DC

## 2016-05-06 NOTE — Addendum Note (Signed)
Addended by: Osa Craver on: 05/06/2016 10:58 AM   Modules accepted: Orders

## 2016-05-06 NOTE — Assessment & Plan Note (Signed)
Well controlled  Patient's medications were reviewed today and patient education was given. Computerized medication calendar was adjusted/completed   Plan  Continue on current regimen .  Follow med calendar closely and bring to each visit.  Follow up Dr. Melvyn Novas in 3 months and As needed   Follow up with Dr. Annamaria Boots  As planned and As needed   Please contact office for sooner follow up if symptoms do not improve or worsen or seek emergency care

## 2016-05-06 NOTE — Assessment & Plan Note (Signed)
Cont follow up with allergy  Cont on current regimen

## 2016-05-06 NOTE — Progress Notes (Signed)
Subjective:   Patient ID: [name]    DOB: [birth year]      Brief patient profile: 74 yobm denies ever smoking but has lifelong asthma with severe chronic pattern and best FEV1 of around 2.2 liters documented 2002 followed in pulmonary for primary care also for hbp and hyperlipidemia.    History of Present Illness    07/30/13 Allergy eval rx completed x 4 years > d/c'd and f/u allergy prn    04/03/2015 Follow up : chronic asthma/ HTN/non adherence / relies on samples/ no formulary / has med calendar  Pt returns for 1 month follow up .  Doing well with no asthma flare , on Dulera . Marland Kitchen  Need to establish with dentist > multiple teeth extracted    03/10/16 ER eval with sob/ fever 102  rx Zpak   03/14/16 NP eval  rec Finish abx    04/08/2016  f/u [facility] re: severe chronic asthma/ on dulera 200 2bid  Chief Complaint  Patient presents with  . Follow-up    Feeling well today and denies any co's.   says took neb before going to ER, ER note says otherwise ? When did you last take your nebulizer  A this morning (confused between dulera and alb neb)  Back to baseline just using the dulera 200 / no saba at all in any form Not limited by breathing from desired activities  But very sedentary     >>no changes , cxr improved   05/06/2016 Follow up ; Severe chronic asthma  Patient presents for 1 month follow up and med review We reviewed all his medications organize them into a medication count with patient education Appears he is taking medications correctly. However, does depend on samples. He did not have his Pepcid or Protonix today but says he has them at home.  Says overall his breathing is doing well without any flare cough or wheezing.  Seen by allergy Dr. Annamaria Boots on 04/23/16   , IgE 203, elevated eos Looking into Nucala.   Prevnar and PVX utd.   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 Is able to sing without sob.      Current Medications, Allergies, Complete  Past Medical History, Past Surgical History, Family History, and Social History were reviewed in [employer] record.  ROS  The following are not active complaints unless bolded sore throat, dysphagia, dental problems, itching, sneezing,  nasal congestion or excess/ purulent secretions, ear ache,   fever, chills, sweats, unintended wt loss, classically pleuritic or exertional cp,  orthopnea pnd or leg swelling, presyncope, palpitations, abdominal pain, anorexia, nausea, vomiting, diarrhea  or change in bowel or bladder habits, change in stools or urine, dysuria,hematuria,  rash, arthralgias, visual complaints, headache, numbness, weakness or ataxia or problems with walking or coordination,  change in mood/affect or memory.             Past Medical Hx ASTHMA (ICD-493.90)  --chronic severe asthma with best FEV1 2.2 L documented 01/2001 and  documented non-adherence Mainly lives off samples of inhalers and nose sprays  - HFA 75% June 15, 2009 > 90% August 02, 2009 > 100% December 27, 2009  - Off allergy vaccines x7/2010 > highly allergic skin tests per Dr Annamaria Boots > restarted vaccine November 06, 2009 > stopped  07/2013  SHOULDER PAIN, RIGHT (ICD-719.41)   HYPERTENSION, BORDERLINE (ICD-401.9)  Shingles T 8 Left 9/01  HEALTH MAINTENANCE...............................................................................................[facility]  - Colonoscopy 12/1989,  2011  -  dT 07/25/2014  - Pneumovax 3/04 and 05/2009 - Prevnar 07/25/14 -Flu declined October 17, 2008 and September 21, 2009 , declined flu shot 2015  -CPX 07/25/2014  - Meds reviewed with pt education and computerized med calendar adjusted last  01/08/2016 , 05/06/2016            Objective:   Physical Exam    amb bm nad  / vital signs reviewed  Filed Vitals:   05/06/16 0958  BP: 110/68  Pulse: 75  Temp: 97.7 F (36.5 C)  TempSrc: Oral  Height: 5\' 8"  (1.727 m)  Weight: 192 lb (87.091 kg)  SpO2: 93%     05/23/2014   201> 201 07/04/2014 > 07/25/14  196>198 08/22/2014 >194 09/19/2014 >198 10/11/2014 > 10/24/2014  196>   01/02/2015 193 > 03/06/2015  198 >198 04/03/2015 > 05/16/2015 196 >  06/02/2015 196 >197 06/16/2015 > 12/12/2015 199> 197 01/08/2016 >   02/16/2016     194 > 04/08/2016  193     HEENT: nl dentition,  and orophanx. Nl external ear canals without cough reflex, poor dentition - moderate bilateral turbinate non-specific edema pattern   NECK :  without JVD/Nodes/TM/ nl carotid upstrokes bilaterally   LUNGS: no acc muscle use, hyperresonant to percussion,  Distant BS bilaterally with no wheeze   CV:  RRR  no s3 or murmur or increase in P2, no edema   ABD:  soft and nontender with nl excursion in the supine position. No bruits or organomegaly, bowel sounds nl  MS:  Nl gait, warm without deformities, nl gait, no calf tenderness, cyanosis or clubbing - neg homan's      CXR PA and Lateral:   04/08/2016 :    Near complete resolution of the bibasilar opacities since prior study.     Assessment & Plan:  Tammy Parrett NP-C  [facility]  05/06/2016

## 2016-05-06 NOTE — Patient Instructions (Signed)
Continue on current regimen .  Follow med calendar closely and bring to each visit.  Follow up Dr. Melvyn Novas in 3 months and As needed   Follow up with Dr. Annamaria Boots  As planned and As needed   Please contact office for sooner follow up if symptoms do not improve or worsen or seek emergency care

## 2016-05-06 NOTE — Progress Notes (Signed)
Chart and office note reviewed in detail  > agree with a/p as outlined    

## 2016-05-07 NOTE — Addendum Note (Signed)
Addended by: Osa Craver on: 05/07/2016 09:13 AM   Modules accepted: Orders, Medications

## 2016-05-15 ENCOUNTER — Telehealth: Payer: Self-pay | Admitting: Internal Medicine

## 2016-05-15 NOTE — Telephone Encounter (Signed)
Patient received letter stating that the injection has been approved.  He said he did not know the name of the medication. In the chart it mentions Nucala and Xolair, so not sure which medication was actually ordered for this patient.  Katie - please follow up.

## 2016-05-15 NOTE — Telephone Encounter (Signed)
Spoke with patient-he is aware that we have received approval for Buy and Bill for Nucala injections. We are working with billing to get patient's responsibility prior to start of medication. Pt is aware that I will keep him updated as I receive new information. Nothing more needed at this time.

## 2016-06-04 ENCOUNTER — Ambulatory Visit (INDEPENDENT_AMBULATORY_CARE_PROVIDER_SITE_OTHER): Payer: Medicare Other | Admitting: Internal Medicine

## 2016-06-04 ENCOUNTER — Encounter: Payer: Self-pay | Admitting: Internal Medicine

## 2016-06-04 VITALS — BP 118/76 | HR 78 | Ht 68.0 in | Wt 199.0 lb

## 2016-06-04 DIAGNOSIS — J4550 Severe persistent asthma, uncomplicated: Secondary | ICD-10-CM

## 2016-06-04 NOTE — Assessment & Plan Note (Signed)
Well-controlled at this visit. Our staff is assisting with Nucala patient assistance application. I will be happy to see him again if needed

## 2016-06-04 NOTE — Progress Notes (Signed)
04/23/2016-75 year old male never smoker referred courtesy of Dr. Melvyn Novas- had been on allergy vaccine several years ago under [facility]. Printed labs from 4 months ago. Seasonal and perennial allergic rhinitis, severe persistent asthma Followed in Pulmonary clinic/ [facility]/ [facility]  --chronic severe asthma with best FEV1 2.2 L documented 01/2001 and documented non-adherence Mainly lives off samples of inhalers and nose sprays  - Off allergy vaccines x7/2010 > highly allergic skin tests per Dr Annamaria Boots > restart vaccine November 06, 2009>stop 07/30/13 to watch - Spirometry on "good day" 01/14/12 =  FEV1 1.54  Ratio 0.58 - 02/16/2016  p extensive coaching HFA effectiveness =    90% from baseline of 75%  - 02/16/2016 added prn prednisone when needing neb Allergy Profile 12/12/2015-total IgE 243, broadly positive for all specific allergens listed except molds. CBCs have shown elevated eosinophil counts. CXR 04/08/2016- IMPRESSION: Near complete resolution of the bibasilar opacities since prior study. Electronically Signed  By: Rolm Baptise M.D.  On: 04/08/2016 09:30 Today he feels well. He suggests when this appointment was made earlier in the spring he was acutely ill with what may have been an infection. Using rescue and nebulizer bronchodilators only if needed. At least some wheeze admitted a few days each week with little cough or sleep disturbance. He says he is using Dulera. FENO 04/23/16- 32/elevated CBC with differential 04/23/2016-eosinophils elevated 7.0  06/04/2016-75 year old male never smoker followed for allergy management of allergic rhinitis, severe persistent asthma/Nucala therapy and per her low with his pulmonologist Dr. Melvyn Novas, FOLLOWS FOR: Pt states his breathing is doing well; denies any issues with allergies at this time. Pt to contact [employer] for copay assistance for Nucala. He feels well stable, allowing for weather changes which affect his breathing some. No recent exacerbation. We  expected to have Nucala approved and started by now but funding assistance has not gone through.   ROS-see HPI   Negative unless "+" Constitutional:    weight loss, night sweats, fevers, chills, fatigue, lassitude. HEENT:    headaches, difficulty swallowing, tooth/dental problems, sore throat,       sneezing, itching, ear ache, nasal congestion, post nasal drip, snoring CV:    chest pain, orthopnea, PND, swelling in lower extremities, anasarca,                                                        dizziness, palpitations Resp:   +shortness of breath with exertion or at rest.                productive cough,   non-productive cough, coughing up of blood.              change in color of mucus.  +wheezing.   Skin:    rash or lesions. GI:  No-   heartburn, indigestion, abdominal pain, nausea, vomiting, diarrhea,                 change in bowel habits, loss of appetite GU: dysuria, change in color of urine, no urgency or frequency.   flank pain. MS:   joint pain, stiffness, decreased range of motion, back pain. Neuro-     nothing unusual Psych:  change in mood or affect.  depression or anxiety.   memory loss.  OBJ- Physical Exam General- Alert, Oriented, Affect-appropriate, Distress- none acute Skin- rash-none, lesions-  none, excoriation- none Lymphadenopathy- none Head- atraumatic            Eyes- Gross vision intact, PERRLA, conjunctivae and secretions clear            Ears- Hearing, canals-normal            Nose- Clear, no-Septal dev, mucus, polyps, erosion, perforation             Throat- Mallampati II , mucosa clear , drainage- none, tonsils- atrophic Neck- flexible , trachea midline, no stridor , thyroid nl, carotid no bruit Chest - symmetrical excursion , unlabored           Heart/CV- RRR , no murmur , no gallop  , no rub, nl s1 s2                           - JVD- none , edema- none, stasis changes- none, varices- none           Lung- unlabored, wheeze + trace, cough- none ,  dullness-none, rub- none           Chest wall-  Abd-  Br/ Gen/ Rectal- Not done, not indicated Extrem- cyanosis- none, clubbing, none, atrophy- none, strength- nl Neuro- grossly intact to observation

## 2016-06-04 NOTE — Patient Instructions (Signed)
We will try to help follow though with your Nucala application, to see if we can get that treatment started for you.  Let Dr Melvyn Novas know if you have problems- keep your August appointment with him

## 2016-06-06 DIAGNOSIS — M9901 Segmental and somatic dysfunction of cervical region: Secondary | ICD-10-CM | POA: Diagnosis not present

## 2016-06-06 DIAGNOSIS — M5033 Other cervical disc degeneration, cervicothoracic region: Secondary | ICD-10-CM | POA: Diagnosis not present

## 2016-07-08 DIAGNOSIS — M9901 Segmental and somatic dysfunction of cervical region: Secondary | ICD-10-CM | POA: Diagnosis not present

## 2016-07-08 DIAGNOSIS — M5033 Other cervical disc degeneration, cervicothoracic region: Secondary | ICD-10-CM | POA: Diagnosis not present

## 2016-07-18 DIAGNOSIS — L723 Sebaceous cyst: Secondary | ICD-10-CM | POA: Diagnosis not present

## 2016-07-24 DIAGNOSIS — M5033 Other cervical disc degeneration, cervicothoracic region: Secondary | ICD-10-CM | POA: Diagnosis not present

## 2016-07-24 DIAGNOSIS — M9901 Segmental and somatic dysfunction of cervical region: Secondary | ICD-10-CM | POA: Diagnosis not present

## 2016-07-26 ENCOUNTER — Other Ambulatory Visit: Payer: Self-pay | Admitting: Internal Medicine

## 2016-08-06 ENCOUNTER — Ambulatory Visit (INDEPENDENT_AMBULATORY_CARE_PROVIDER_SITE_OTHER): Payer: Medicare Other | Admitting: Internal Medicine

## 2016-08-06 ENCOUNTER — Encounter: Payer: Self-pay | Admitting: Internal Medicine

## 2016-08-06 VITALS — BP 142/82 | HR 75 | Ht 68.0 in | Wt 196.0 lb

## 2016-08-06 DIAGNOSIS — J302 Other seasonal allergic rhinitis: Secondary | ICD-10-CM

## 2016-08-06 DIAGNOSIS — J4550 Severe persistent asthma, uncomplicated: Secondary | ICD-10-CM | POA: Diagnosis not present

## 2016-08-06 DIAGNOSIS — J309 Allergic rhinitis, unspecified: Secondary | ICD-10-CM

## 2016-08-06 DIAGNOSIS — J3089 Other allergic rhinitis: Secondary | ICD-10-CM

## 2016-08-06 MED ORDER — MOMETASONE FURO-FORMOTEROL FUM 200-5 MCG/ACT IN AERO
INHALATION_SPRAY | RESPIRATORY_TRACT | 11 refills | Status: DC
Start: 2016-08-06 — End: 2016-10-07

## 2016-08-06 MED ORDER — MOMETASONE FURO-FORMOTEROL FUM 200-5 MCG/ACT IN AERO: 2.0000 | INHALATION_SPRAY | Freq: Two times a day (BID) | RESPIRATORY_TRACT | 0 refills | Status: DC

## 2016-08-06 NOTE — Progress Notes (Signed)
Subjective:   Patient ID: [name]    DOB: 1941/09/09      Brief patient profile: 43  yobm denies ever smoking but has lifelong asthma with severe chronic pattern and best FEV1 of around 2.2 liters documented 2002 followed in pulmonary for primary care also for hbp and hyperlipidemia.    History of Present Illness    07/30/13 Allergy eval rx completed x 4 years > d/c'd and f/u allergy prn    04/03/2015 Follow up : chronic asthma/ HTN/non adherence / relies on samples/ no formulary / has med calendar  Pt returns for 1 month follow up .  Doing well with no asthma flare , on Dulera . Marland Kitchen  Need to establish with dentist > multiple teeth extracted   05/06/2016 NP Follow up ; Severe chronic asthma  Patient presents for 1 month follow up and med review We reviewed all his medications organize them into a medication count with patient education Appears he is taking medications correctly. However, does depend on samples. He did not have his Pepcid or Protonix today but says he has them at home. Says overall his breathing is doing well without any flare cough or wheezing. Seen by allergy Dr. Annamaria Boots on 04/23/16   , IgE 203, elevated eos Looking into Nucala.  Prevnar and PVX utd.  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 Is able to sing without sob.  rec No change rx   06/04/16 Dr Annamaria Boots rec Geradine Girt   08/06/2016  f/u ov/Wert re: chronic severe asthma / difficulty with adherence due to finances  Has been approved for nucala but hasn't started it Chief Complaint  Patient presents with  . Follow-up    pt states he is doing well, has increased SOB in heat/humid temps.    min need for saba hfa/ hardly ever neb/ only breathing problem out in heat  Appears to be completely dep on samples but the numbers don't add up on the dulera 200 and has one week left after last samples given on 5 01/07/16 (4 week supply) - very poor insight into formulary issues, turns out he's never turned in rx  for dulera so doesn't actually know how much it would cost or whether covered on present plan  No obvious day to day or daytime variability in sob other than with heat or assoc excess/ purulent sputum or mucus plugs or hemoptysis or cp or chest tightness, subjective wheeze or overt sinus or hb symptoms. No unusual exp hx or h/o childhood pna/ asthma or knowledge of premature birth.  Sleeping ok without nocturnal  or early am exacerbation  of respiratory  c/o's or need for noct saba. Also denies any obvious fluctuation of symptoms with weather or environmental changes or other aggravating or alleviating factors except as outlined above   Current Medications, Allergies, Complete Past Medical History, Past Surgical History, Family History, and Social History were reviewed in [employer] record.  ROS  The following are not active complaints unless bolded sore throat, dysphagia, dental problems, itching, sneezing,  nasal congestion or excess/ purulent secretions, ear ache,   fever, chills, sweats, unintended wt loss, classically pleuritic or exertional cp,  orthopnea pnd or leg swelling, presyncope, palpitations, abdominal pain, anorexia, nausea, vomiting, diarrhea  or change in bowel or bladder habits, change in stools or urine, dysuria,hematuria,  rash, arthralgias, visual complaints, headache, numbness, weakness or ataxia or problems with walking or coordination,  change in mood/affect or memory.  Past Medical Hx ASTHMA (ICD-493.90)  --chronic severe asthma with best FEV1 2.2 L documented 01/2001 and  documented non-adherence Mainly lives off samples of inhalers and nose sprays  - HFA 75% June 15, 2009 > 90% August 02, 2009 > 100% December 27, 2009  - Off allergy vaccines x7/2010 > highly allergic skin tests per Dr Annamaria Boots > restarted vaccine November 06, 2009 > stopped  07/2013  SHOULDER PAIN, RIGHT (ICD-719.41)   HYPERTENSION, BORDERLINE  (ICD-401.9)  Shingles T 8 Left 9/01  HEALTH MAINTENANCE...............................................................................................Marland KitchenWert  - Colonoscopy 12/1989,  2011  - dT 07/25/2014  - Pneumovax 3/04 and 05/2009 - Prevnar 07/25/14 -Flu declined October 17, 2008 and September 21, 2009 , declined flu shot 2015  -CPX 07/25/2014  - Meds reviewed with pt education and computerized med calendar adjusted last  01/08/2016 , 05/06/2016            Objective:   Physical Exam        amb bm/ nad/vital signs reviewed  05/23/2014  201> 201 07/04/2014 > 07/25/14  196>198 08/22/2014 >194 09/19/2014 >198 10/11/2014 > 10/24/2014  196>   01/02/2015 193 > 03/06/2015  198 >198 04/03/2015 > 05/16/2015 196 >  06/02/2015 196 >197 06/16/2015 > 12/12/2015 199> 197 01/08/2016 >   02/16/2016     194 > 04/08/2016  193> 08/06/2016    196      HEENT: nl dentition,  and orophanx. Nl external ear canals without cough reflex, poor dentition - moderate bilateral turbinate non-specific edema pattern   NECK :  without JVD/Nodes/TM/ nl carotid upstrokes bilaterally   LUNGS: no acc muscle use, hyperresonant to percussion,  Distant BS bilaterally with no wheeze   CV:  RRR  no s3 or murmur or increase in P2, no edema   ABD:  soft and nontender with nl excursion in the supine position. No bruits or organomegaly, bowel sounds nl  MS:  Nl gait, warm without deformities, nl gait, no calf tenderness, cyanosis or clubbing - neg homan's           Assessment & Plan:

## 2016-08-06 NOTE — Patient Instructions (Addendum)
See [name]  2 months  with all your medications, even over the counter meds, separated in two separate bags, the ones you take no matter what vs the ones you stop once you feel better and take only as needed when you feel you need them.      When you return you will need your drug formulary to pick the most affordable medications from the list so you are less dependent on the samples  4 weeks samples given 08/06/2016 with one week left in his stock

## 2016-08-07 NOTE — Assessment & Plan Note (Signed)
--  chronic severe asthma with best FEV1 2.2 L documented 01/2001 and documented non-adherence Mainly lives off samples of inhalers and nose sprays  - Off allergy vaccines x7/2010 > highly allergic skin tests per Dr Annamaria Boots > restart vaccine November 06, 2009>stop 07/30/13 to watch - Spirometry on "good day" 01/14/12 =  FEV1 1.54  Ratio 0.58 - 02/16/2016  p extensive coaching HFA effectiveness =    90% from baseline of 75%  - 02/16/2016 added prn prednisone when needing neb - FENO 04/23/16- 32  -CBC with differential 04/23/2016-eosinophils elevated 7.0   I had an extended discussion with the patient reviewing all relevant studies completed to date and  lasting 15 to 20 minutes of a 25 minute visit on the following ongoing concerns:   1) I am very concerned re insight into meds  2) Formulary restrictions will be an ongoing challenge for the forseable future and I would be happy to pick an alternative if the pt will first  provide me a list of them but pt  will need to return here for training for any new device that is required eg dpi vs hfa vs respimat.    In meantime we can always provide samples so the patient never runs out of any needed respiratory medications.   3) no change in meds needed at this point  4)  Each maintenance medication was reviewed in detail including most importantly the difference between maintenance and as needed and under what circumstances the prns are to be used. This was done in the context of a medication calendar review which provided the patient with a user-friendly unambiguous mechanism for medication administration and reconciliation and provides an action plan for all active problems. It is critical that this be shown to every doctor  for modification during the office visit if necessary so the patient can use it as a working document.

## 2016-08-07 NOTE — Assessment & Plan Note (Signed)
Off allergy vaccines x7/2010 > highly allergic skin tests per Dr Annamaria Boots > restarted vaccine November 06, 2009  Allergy vaccine 1:50:000 10/30/09; adv to 1:10 11/20/10 GH. D/Cd 07/30/13 - allergy profile 12/12/15 >  Eos 0.3 /   IgE  243 multiple pos RAST with dog the worst  - 07/2016 approved for nucala   Due to issues of compliance and atopic background his best hope at this point is biologics  Discussed in detail all the  indications, usual  risks and alternatives  relative to the benefits with patient who agrees to proceed Nucala under Dr Janee Morn direction

## 2016-08-13 ENCOUNTER — Ambulatory Visit: Payer: Self-pay | Admitting: Surgery

## 2016-08-14 ENCOUNTER — Encounter (HOSPITAL_BASED_OUTPATIENT_CLINIC_OR_DEPARTMENT_OTHER): Payer: Self-pay | Admitting: Anesthesiology

## 2016-08-14 ENCOUNTER — Ambulatory Visit (HOSPITAL_BASED_OUTPATIENT_CLINIC_OR_DEPARTMENT_OTHER)
Admission: RE | Admit: 2016-08-14 | Discharge: 2016-08-14 | Disposition: A | Discharge status: Home / Self Care | Payer: Medicare Other | Source: Ambulatory Visit | Attending: Surgery | Admitting: Surgery

## 2016-08-14 ENCOUNTER — Encounter (HOSPITAL_BASED_OUTPATIENT_CLINIC_OR_DEPARTMENT_OTHER): Payer: Self-pay | Admitting: *Deleted

## 2016-08-14 ENCOUNTER — Encounter (HOSPITAL_BASED_OUTPATIENT_CLINIC_OR_DEPARTMENT_OTHER)
Admission: RE | Disposition: A | Discharge status: Home / Self Care | Payer: Self-pay | Source: Ambulatory Visit | Attending: Surgery

## 2016-08-14 DIAGNOSIS — K219 Gastro-esophageal reflux disease without esophagitis: Secondary | ICD-10-CM | POA: Diagnosis not present

## 2016-08-14 DIAGNOSIS — L723 Sebaceous cyst: Secondary | ICD-10-CM | POA: Diagnosis not present

## 2016-08-14 DIAGNOSIS — Z7982 Long term (current) use of aspirin: Secondary | ICD-10-CM | POA: Diagnosis not present

## 2016-08-14 DIAGNOSIS — Z79899 Other long term (current) drug therapy: Secondary | ICD-10-CM | POA: Diagnosis not present

## 2016-08-14 DIAGNOSIS — I10 Essential (primary) hypertension: Secondary | ICD-10-CM | POA: Insufficient documentation

## 2016-08-14 DIAGNOSIS — L720 Epidermal cyst: Secondary | ICD-10-CM | POA: Insufficient documentation

## 2016-08-14 DIAGNOSIS — J4550 Severe persistent asthma, uncomplicated: Secondary | ICD-10-CM | POA: Insufficient documentation

## 2016-08-14 HISTORY — PX: MASS EXCISION: SHX2000

## 2016-08-14 SURGERY — MINOR EXCISION OF MASS
Anesthesia: LOCAL | Site: Neck

## 2016-08-14 MED ORDER — HEPARIN SODIUM (PORCINE) 5000 UNIT/ML IJ SOLN: 5000.0000 [IU] | Freq: Once | INTRAMUSCULAR | Status: DC

## 2016-08-14 MED ORDER — CHLORHEXIDINE GLUCONATE CLOTH 2 % EX PADS: 6.0000 | MEDICATED_PAD | Freq: Once | CUTANEOUS | Status: DC

## 2016-08-14 MED ORDER — LIDOCAINE HCL (PF) 1 % IJ SOLN
INTRAMUSCULAR | Status: AC
  Filled 2016-08-14: qty 30

## 2016-08-14 MED ORDER — SODIUM BICARBONATE 4 % IV SOLN
INTRAVENOUS | Status: AC
  Filled 2016-08-14: qty 5

## 2016-08-14 MED ORDER — SODIUM BICARBONATE 4 % IV SOLN
INTRAVENOUS | Status: DC | PRN
  Administered 2016-08-14: 5 mL

## 2016-08-14 MED ORDER — LIDOCAINE-EPINEPHRINE (PF) 1 %-1:200000 IJ SOLN
INTRAMUSCULAR | Status: AC
  Filled 2016-08-14: qty 30

## 2016-08-14 MED ORDER — HYDROCODONE-ACETAMINOPHEN 5-325 MG PO TABS
1.0000 | ORAL_TABLET | ORAL | 0 refills | Status: DC | PRN
Start: 2016-08-14 — End: 2017-03-28

## 2016-08-14 SURGICAL SUPPLY — 25 items
BENZOIN TINCTURE PRP APPL 2/3 (GAUZE/BANDAGES/DRESSINGS) IMPLANT
BLADE SURG 15 STRL LF DISP TIS (BLADE) ×1 IMPLANT
BLADE SURG 15 STRL SS (BLADE) ×1
CHLORAPREP W/TINT 26ML (MISCELLANEOUS) ×2 IMPLANT
DRSG TEGADERM 4X4.75 (GAUZE/BANDAGES/DRESSINGS) IMPLANT
ELECT REM PT RETURN 9FT ADLT (ELECTROSURGICAL)
ELECTRODE REM PT RTRN 9FT ADLT (ELECTROSURGICAL) IMPLANT
GAUZE IODOFORM PACK 1/2 7832 (GAUZE/BANDAGES/DRESSINGS) IMPLANT
GAUZE SPONGE 4X4 16PLY XRAY LF (GAUZE/BANDAGES/DRESSINGS) ×2 IMPLANT
GLOVE BIO SURGEON STRL SZ8 (GLOVE) ×2 IMPLANT
GLOVE ECLIPSE 6.5 STRL STRAW (GLOVE) ×2 IMPLANT
LIQUID BAND (GAUZE/BANDAGES/DRESSINGS) ×2 IMPLANT
MARKER SKIN DUAL TIP RULER LAB (MISCELLANEOUS) IMPLANT
NEEDLE HYPO 30X.5 LL (NEEDLE) ×2 IMPLANT
PACK BASIN DAY SURGERY FS (CUSTOM PROCEDURE TRAY) ×2 IMPLANT
PENCIL BUTTON HOLSTER BLD 10FT (ELECTRODE) IMPLANT
SPONGE GAUZE 4X4 12PLY STER LF (GAUZE/BANDAGES/DRESSINGS) IMPLANT
STRIP CLOSURE SKIN 1/2X4 (GAUZE/BANDAGES/DRESSINGS) IMPLANT
SUT PROLENE 5 0 P 3 (SUTURE) IMPLANT
SUT SILK 4 0 TIES 17X18 (SUTURE) IMPLANT
SUT VIC AB 4-0 SH 18 (SUTURE) ×2 IMPLANT
SUT VIC AB 5-0 P-3 18X BRD (SUTURE) IMPLANT
SUT VIC AB 5-0 P3 18 (SUTURE)
SWABSTICK POVIDONE IODINE SNGL (MISCELLANEOUS) IMPLANT
SYR CONTROL 10ML LL (SYRINGE) ×2 IMPLANT

## 2016-08-14 NOTE — Op Note (Signed)
Surgeon: Kaylyn Lim, MD, FACS  Asst:  none  Anes:  Local 1 % lidocaine  Procedure: Excision of sebaceous cyst of mid back  Diagnosis: Sebaceous cyst  Complications: None   EBL:   minimal cc  Drains: none  Description of Procedure:  The patient was taken to OR 1 at [facility].  After anesthesia was administered and the patient was prepped a timeout was performed.  A transverse incision was used to excise an ellipse of skin including the punctum.  The cyst has previously been inflamed and drained gray tan material.  The sac was removed separately.  The wound was closed with 4-0 vicryl and Liquiban.    The patient tolerated the procedure well and was taken to the PACU in stable condition.     Matt B. Hassell Done, [place], [facility], [place]

## 2016-08-14 NOTE — H&P (Signed)
Chief Complaint:  Sebaceous cyst in the middle of the back  History of Present Illness:  [name] is an 75 y.o. male with a sebaceous cyst that is symptomatic that he wants removed.  It is in the middle of back superiorly.    Past Medical History:  Diagnosis Date  . Acid reflux   . Asthma 06/18/2011  . Borderline hypertension   . Cataract   . Colon polyps 2011   1 Hyperplastic and 1 Tubular Adenoma   . Dysphagia   . Esophageal stricture   . GERD (gastroesophageal reflux disease)   . Hx of adenomatous colonic polyps 06/25/2015  . Personal history of colonic polyps 12/1989  . Shingles   . Shoulder pain, right   . Unspecified asthma(493.90)   . Urinary frequency     Past Surgical History:  Procedure Laterality Date  . BALLOON DILATION N/A 02/07/2015   Procedure: BALLOON DILATION;  Surgeon: Gatha Mayer, MD;  Location: [facility];  Service: Endoscopy;  Laterality: N/A;  . ESOPHAGOGASTRODUODENOSCOPY (EGD) WITH PROPOFOL N/A 02/07/2015   Procedure: ESOPHAGOGASTRODUODENOSCOPY (EGD) WITH PROPOFOL;  Surgeon: Gatha Mayer, MD;  Location: [facility];  Service: Endoscopy;  Laterality: N/A;  . HEMORRHOID SURGERY    . HERNIA REPAIR    . TONSILLECTOMY      Current Facility-Administered Medications  Medication Dose Route Frequency Provider Last Rate Last Dose  . Chlorhexidine Gluconate Cloth 2 % PADS 6 each  6 each Topical Once Johnathan Hausen, MD       And  . Chlorhexidine Gluconate Cloth 2 % PADS 6 each  6 each Topical Once Johnathan Hausen, MD      . heparin injection 5,000 Units  5,000 Units Subcutaneous Once Johnathan Hausen, MD       Review of patient's allergies indicates no known allergies. Family History  Problem Relation Age of Onset  . Heart disease Mother   . Kidney failure Sister   . Breast cancer Sister   . Lung cancer Brother   . Brain cancer Sister   . Asthma Sister   . Colon cancer Neg Hx    Social History:   reports that he has never smoked. He has never used  smokeless tobacco. He reports that he does not drink alcohol or use drugs.   REVIEW OF SYSTEMS : Negative except for see problem list  Physical Exam:   Blood pressure (!) 178/85, pulse 64, temperature 98.4 F (36.9 C), temperature source Oral, resp. rate 18, height 5\' 8"  (1.727 m), weight 87.7 kg (193 lb 6.4 oz), SpO2 98 %. Body mass index is 29.41 kg/m.  Gen:  WDWN AAM NAD  Neurological: Alert and oriented to person, place, and time. Motor and sensory function is grossly intact  Head: Normocephalic and atraumatic.  Eyes: Conjunctivae are normal. Pupils are equal, round, and reactive to light. No scleral icterus.  Neck: Normal range of motion. Neck supple. No tracheal deviation or thyromegaly present.  Cardiovascular:  SR without murmurs or gallops.  No carotid bruits Breast:  Not examined Respiratory: Effort normal.  No respiratory distress. No chest wall tenderness. Breath sounds normal.  No wheezes, rales or rhonchi.  Abdomen:  Not examined GU:  Not examined Back:  2 cm mass in the mid back is marked for excision Musculoskeletal: Normal range of motion. Extremities are nontender. No cyanosis, edema or clubbing noted Lymphadenopathy: No cervical, preauricular, postauricular or axillary adenopathy is present Skin: Skin is warm and dry. No rash noted. No diaphoresis. No  erythema. No pallor. Pscyh: Normal mood and affect. Behavior is normal. Judgment and thought content normal.   LABORATORY RESULTS: No results found for this or any previous visit (from the past 48 hour(s)).   RADIOLOGY RESULTS: No results found.  Problem List: Patient Active Problem List   Diagnosis Date Noted  . CAP (community acquired pneumonia) 03/14/2016  . Hx of adenomatous colonic polyps 06/25/2015  . Esophageal stricture   . Localized swelling of lower leg 09/19/2014  . Acute upper respiratory infection 11/22/2013  . Essential hypertension, benign 09/05/2011  . Hyperlipidemia 08/08/2011  . Healthcare  maintenance 04/04/2011  . History of shingles 04/04/2011  . Esophageal dysphagia 03/12/2011  . GERD 11/06/2009  . Seasonal and perennial allergic rhinitis 05/31/2009  . Severe persistent asthma 01/04/2008    Assessment & Plan: Sebaceous cyst of the back for excision    Matt B. Hassell Done, MD, [facility] [phone] beeper [phone]  08/14/2016 10:00 AM

## 2016-08-14 NOTE — Anesthesia Preprocedure Evaluation (Deleted)
Anesthesia Evaluation  Patient identified by MRN, date of birth, ID band Patient awake    Reviewed: Allergy & Precautions, H&P , NPO status , Patient's Chart, lab work & pertinent test results  Airway Mallampati: II  TM Distance: >3 FB Neck ROM: full    Dental  (+) Poor Dentition, Missing, Dental Advisory Given Many missing front teeth:   Pulmonary asthma ,  Severe asthma   Pulmonary exam normal breath sounds clear to auscultation       Cardiovascular Exercise Tolerance: Good hypertension, Pt. on medications Normal cardiovascular exam Rhythm:regular Rate:Normal     Neuro/Psych negative neurological ROS  negative psych ROS   GI/Hepatic Neg liver ROS, GERD  Medicated and Controlled,  Endo/Other  negative endocrine ROS  Renal/GU negative Renal ROS  negative genitourinary   Musculoskeletal   Abdominal   Peds  Hematology negative hematology ROS (+)   Anesthesia Other Findings   Reproductive/Obstetrics negative OB ROS                             Anesthesia Physical  Anesthesia Plan  ASA: III  Anesthesia Plan: MAC   Post-op Pain Management:    Induction: Intravenous  Airway Management Planned: Simple Face Mask  Additional Equipment:   Intra-op Plan:   Post-operative Plan:   Informed Consent: I have reviewed the patients History and Physical, chart, labs and discussed the procedure including the risks, benefits and alternatives for the proposed anesthesia with the patient or authorized representative who has indicated his/her understanding and acceptance.   Dental Advisory Given  Plan Discussed with: CRNA and Surgeon  Anesthesia Plan Comments:         Anesthesia Quick Evaluation

## 2016-08-14 NOTE — Discharge Instructions (Signed)
May shower tomorrow.

## 2016-08-15 ENCOUNTER — Encounter (HOSPITAL_BASED_OUTPATIENT_CLINIC_OR_DEPARTMENT_OTHER): Payer: Self-pay | Admitting: Surgery

## 2016-09-17 DIAGNOSIS — M5033 Other cervical disc degeneration, cervicothoracic region: Secondary | ICD-10-CM | POA: Diagnosis not present

## 2016-09-17 DIAGNOSIS — M9901 Segmental and somatic dysfunction of cervical region: Secondary | ICD-10-CM | POA: Diagnosis not present

## 2016-10-01 DIAGNOSIS — H524 Presbyopia: Secondary | ICD-10-CM | POA: Diagnosis not present

## 2016-10-01 DIAGNOSIS — Z961 Presence of intraocular lens: Secondary | ICD-10-CM | POA: Diagnosis not present

## 2016-10-03 DIAGNOSIS — M542 Cervicalgia: Secondary | ICD-10-CM | POA: Diagnosis not present

## 2016-10-03 DIAGNOSIS — M9901 Segmental and somatic dysfunction of cervical region: Secondary | ICD-10-CM | POA: Diagnosis not present

## 2016-10-07 ENCOUNTER — Ambulatory Visit (INDEPENDENT_AMBULATORY_CARE_PROVIDER_SITE_OTHER): Payer: Medicare Other | Admitting: Adult Health

## 2016-10-07 ENCOUNTER — Encounter: Payer: Self-pay | Admitting: Adult Health

## 2016-10-07 DIAGNOSIS — E7849 Other hyperlipidemia: Secondary | ICD-10-CM

## 2016-10-07 DIAGNOSIS — E784 Other hyperlipidemia: Secondary | ICD-10-CM | POA: Diagnosis not present

## 2016-10-07 DIAGNOSIS — J4550 Severe persistent asthma, uncomplicated: Secondary | ICD-10-CM

## 2016-10-07 DIAGNOSIS — I10 Essential (primary) hypertension: Secondary | ICD-10-CM | POA: Diagnosis not present

## 2016-10-07 MED ORDER — ALBUTEROL SULFATE HFA 108 (90 BASE) MCG/ACT IN AERS: INHALATION_SPRAY | RESPIRATORY_TRACT | 5 refills | Status: DC

## 2016-10-07 NOTE — Progress Notes (Signed)
Subjective:   Patient ID: [name]    DOB: 08-30-1941      Brief patient profile: 76  yobm denies ever smoking but has lifelong asthma with severe chronic pattern and best FEV1 of around 2.2 liters documented 2002 followed in pulmonary for primary care also for hbp and hyperlipidemia.    History of Present Illness    07/30/13 Allergy eval rx completed x 4 years > d/c'd and f/u allergy prn    04/03/2015 Follow up : chronic asthma/ HTN/non adherence / relies on samples/ no formulary / has med calendar  Pt returns for 1 month follow up .  Doing well with no asthma flare , on Dulera . Marland Kitchen  Need to establish with dentist > multiple teeth extracted   05/06/2016 NP Follow up ; Severe chronic asthma  Patient presents for 1 month follow up and med review We reviewed all his medications organize them into a medication count with patient education Appears he is taking medications correctly. However, does depend on samples. He did not have his Pepcid or Protonix today but says he has them at home. Says overall his breathing is doing well without any flare cough or wheezing. Seen by allergy Dr. Annamaria Boots on 04/23/16   , IgE 203, elevated eos Looking into Nucala.  Prevnar and PVX utd.  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 Is able to sing without sob.  rec No change rx   06/04/16 Dr Annamaria Boots rec Geradine Girt   08/06/2016  f/u ov/Wert re: chronic severe asthma / difficulty with adherence due to finances  Has been approved for nucala but hasn't started it Chief Complaint  Patient presents with  . Follow-up    pt states he is doing well, has increased SOB in heat/humid temps.    min need for saba hfa/ hardly ever neb/ only breathing problem out in heat  Appears to be completely dep on samples but the numbers don't add up on the dulera 200 and has one week left after last samples given on 5 01/07/16 (4 week supply) - very poor insight into formulary issues, turns out he's never turned in rx  for dulera so doesn't actually know how much it would cost or whether covered on present plan >>no changes   10/07/2016 Follow up : Chronic Severe Asthma / Pt returns for for 3 month follow up for asthma. Says he has been doing well . No flare of cough or wheezing.  No steroid use , ER visit or increased SABA use.  We reviewed his meds and organized them into a med calendar with pt education .  Appears to be taking correctly. Cost is an issue for him, depends on samples if available.  Still waiting on Nucala approval.  Denies chest pain, orthopnea, edema or fever.  Sings different places, going to [place] this weekend.  Declines flu shot .  Prevnar utd 2015, PVX utd 2010 .   Current Medications, Allergies, Complete Past Medical History, Past Surgical History, Family History, and Social History were reviewed in [employer] record.  ROS  The following are not active complaints unless bolded sore throat, dysphagia, dental problems, itching, sneezing,  nasal congestion or excess/ purulent secretions, ear ache,   fever, chills, sweats, unintended wt loss, classically pleuritic or exertional cp,  orthopnea pnd or leg swelling, presyncope, palpitations, abdominal pain, anorexia, nausea, vomiting, diarrhea  or change in bowel or bladder habits, change in stools or urine, dysuria,hematuria,  rash,  arthralgias, visual complaints, headache, numbness, weakness or ataxia or problems with walking or coordination,  change in mood/affect or memory.                           Past Medical Hx ASTHMA (ICD-493.90)  --chronic severe asthma with best FEV1 2.2 L documented 01/2001 and  documented non-adherence Mainly lives off samples of inhalers and nose sprays  - HFA 75% June 15, 2009 > 90% August 02, 2009 > 100% December 27, 2009  - Off allergy vaccines x7/2010 > highly allergic skin tests per Dr Annamaria Boots > restarted vaccine November 06, 2009 > stopped  07/2013  SHOULDER PAIN, RIGHT  (ICD-719.41)   HYPERTENSION, BORDERLINE (ICD-401.9)  Shingles T 8 Left 9/01  HEALTH MAINTENANCE...............................................................................................Marland KitchenWert  - Colonoscopy 12/1989,  2011  - dT 07/25/2014  - Pneumovax 3/04 and 05/2009 - Prevnar 07/25/14 -Flu declined October 17, 2008 and September 21, 2009 , declined flu shot 2015, 2017  -CPX 07/25/2014  - Meds reviewed with pt education and computerized med calendar adjusted last  01/08/2016 , 05/06/2016 , 10/07/2016            Objective:   Physical Exam        amb bm/ nad/vital signs reviewed  Vitals:   10/07/16 0946  BP: 128/72  Pulse: 66  Temp: 97.7 F (36.5 C)  TempSrc: Oral  SpO2: 96%  Weight: 195 lb (88.5 kg)  Height: 5' (1.524 m)     05/23/2014  201> 201 07/04/2014 > 07/25/14  196>198 08/22/2014 >194 09/19/2014 >198 10/11/2014 > 10/24/2014  196>   01/02/2015 193 > 03/06/2015  198 >198 04/03/2015 > 05/16/2015 196 >  06/02/2015 196 >197 06/16/2015 > 12/12/2015 199> 197 01/08/2016 >   02/16/2016     194 > 04/08/2016  193> 08/06/2016    196      HEENT: nl dentition,  and orophanx. Nl external ear canals without cough reflex, poor dentition - moderate bilateral turbinate non-specific edema pattern   NECK :  without JVD/Nodes/TM/ nl carotid upstrokes bilaterally   LUNGS: no acc muscle use, hyperresonant to percussion,  Distant BS bilaterally with no wheeze   CV:  RRR  no s3 or murmur or increase in P2, no edema   ABD:  soft and nontender with nl excursion in the supine position. No bruits or organomegaly, bowel sounds nl  MS:  Nl gait, warm without deformities, nl gait, no calf tenderness, cyanosis or clubbing - neg homan's       Oneill Bais NP-C  [facility]  10/07/2016

## 2016-10-07 NOTE — Assessment & Plan Note (Signed)
Well controlled  Patient's medications were reviewed today and patient education was given. Computerized medication calendar was adjusted/completed   Plan  Patient Instructions  Continue on current regimen .  Follow med calendar closely and bring to each visit.  Follow up Dr. Melvyn Novas in 3 months for physical with labs, come fasting.  Follow up with Dr. Annamaria Boots  As planned and As needed   Please contact office for sooner follow up if symptoms do not improve or worsen or seek emergency care

## 2016-10-07 NOTE — Addendum Note (Signed)
Addended by: Osa Craver on: 10/07/2016 10:28 AM   Modules accepted: Orders

## 2016-10-07 NOTE — Patient Instructions (Signed)
Continue on current regimen .  Follow med calendar closely and bring to each visit.  Follow up Dr. Melvyn Novas in 3 months for physical with labs, come fasting.  Follow up with Dr. Annamaria Boots  As planned and As needed   Please contact office for sooner follow up if symptoms do not improve or worsen or seek emergency care

## 2016-10-07 NOTE — Assessment & Plan Note (Signed)
Controlled without flare   Plan  Patient Instructions  Continue on current regimen .  Follow med calendar closely and bring to each visit.  Follow up Dr. Melvyn Novas in 3 months for physical with labs, come fasting.  Follow up with Dr. Annamaria Boots  As planned and As needed   Please contact office for sooner follow up if symptoms do not improve or worsen or seek emergency care

## 2016-10-07 NOTE — Assessment & Plan Note (Signed)
Diet and exercise discussed Return for cpx and labs in 3 months

## 2016-10-07 NOTE — Progress Notes (Signed)
Chart and office note reviewed in detail  > agree with a/p as outlined    

## 2016-12-20 ENCOUNTER — Telehealth: Payer: Self-pay | Admitting: Internal Medicine

## 2016-12-20 NOTE — Telephone Encounter (Signed)
Called and spoke with pt and he is aware that we have no samples in the office of the [place].

## 2016-12-24 ENCOUNTER — Telehealth: Payer: Self-pay | Admitting: Internal Medicine

## 2016-12-24 DIAGNOSIS — S233XXA Sprain of ligaments of thoracic spine, initial encounter: Secondary | ICD-10-CM | POA: Diagnosis not present

## 2016-12-24 DIAGNOSIS — M9902 Segmental and somatic dysfunction of thoracic region: Secondary | ICD-10-CM | POA: Diagnosis not present

## 2016-12-24 NOTE — Telephone Encounter (Signed)
I called and lmom to make the pt aware that we do not have any samples of the dulera.  He may call back later this week to see if we have any samples that have come in.

## 2016-12-27 ENCOUNTER — Telehealth: Payer: Self-pay | Admitting: Internal Medicine

## 2016-12-27 NOTE — Telephone Encounter (Signed)
Spoke with pt. And informed him that we currently do not have any samples. I informed him that he can try back next week and see if we have any. Nothing further is needed at this time.

## 2016-12-30 ENCOUNTER — Telehealth: Payer: Self-pay | Admitting: Internal Medicine

## 2016-12-30 NOTE — Telephone Encounter (Signed)
Pt scheduled for acute visit with MW on 12-31-16 @ 12:00 Pt aware and voiced his understanding.  Nothing further needed.

## 2016-12-31 ENCOUNTER — Ambulatory Visit (INDEPENDENT_AMBULATORY_CARE_PROVIDER_SITE_OTHER): Payer: Medicare Other | Admitting: Internal Medicine

## 2016-12-31 ENCOUNTER — Encounter: Payer: Self-pay | Admitting: Internal Medicine

## 2016-12-31 VITALS — BP 140/90 | HR 83 | Ht 68.0 in | Wt 191.0 lb

## 2016-12-31 DIAGNOSIS — J302 Other seasonal allergic rhinitis: Secondary | ICD-10-CM

## 2016-12-31 DIAGNOSIS — S233XXA Sprain of ligaments of thoracic spine, initial encounter: Secondary | ICD-10-CM | POA: Diagnosis not present

## 2016-12-31 DIAGNOSIS — J3089 Other allergic rhinitis: Secondary | ICD-10-CM | POA: Diagnosis not present

## 2016-12-31 DIAGNOSIS — M9902 Segmental and somatic dysfunction of thoracic region: Secondary | ICD-10-CM | POA: Diagnosis not present

## 2016-12-31 DIAGNOSIS — J4551 Severe persistent asthma with (acute) exacerbation: Secondary | ICD-10-CM

## 2016-12-31 MED ORDER — BUDESONIDE-FORMOTEROL FUMARATE 160-4.5 MCG/ACT IN AERO: 2.0000 | INHALATION_SPRAY | Freq: Two times a day (BID) | RESPIRATORY_TRACT | 0 refills | Status: DC

## 2016-12-31 MED ORDER — PREDNISONE 10 MG PO TABS: ORAL_TABLET | ORAL | 2 refills | Status: DC

## 2016-12-31 NOTE — Progress Notes (Signed)
Subjective:   Patient ID: [name]    DOB: 18-Feb-1941      Brief patient profile: 46  yobm denies ever smoking but has lifelong asthma with severe chronic pattern and best FEV1 of around 2.2 liters documented 2002 followed in pulmonary for primary care also for hbp and hyperlipidemia.    History of Present Illness    07/30/13 Allergy eval rx completed x 4 years > d/c'd and f/u allergy prn    04/03/2015 Follow up : chronic asthma/ HTN/non adherence / relies on samples/ no formulary / has med calendar  Pt returns for 1 month follow up .  Doing well with no asthma flare , on Dulera . Marland Kitchen  Need to establish with dentist > multiple teeth extracted   05/06/2016 NP Follow up ; Severe chronic asthma  Patient presents for 1 month follow up and med review We reviewed all his medications organize them into a medication count with patient education Appears he is taking medications correctly. However, does depend on samples. He did not have his Pepcid or Protonix today but says he has them at home. Says overall his breathing is doing well without any flare cough or wheezing. Seen by allergy Dr. Annamaria Boots on 04/23/16   , IgE 203, elevated eos Looking into Nucala.  Prevnar and PVX utd.  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 Is able to sing without sob.  rec No change rx   06/04/16 Dr Annamaria Boots rec Geradine Girt   08/06/2016  f/u ov/Lizzie Cokley re: chronic severe asthma / difficulty with adherence due to finances  Has been approved for nucala but hasn't started it Chief Complaint  Patient presents with  . Follow-up    pt states he is doing well, has increased SOB in heat/humid temps.    min need for saba hfa/ hardly ever neb/ only breathing problem out in heat  Appears to be completely dep on samples but the numbers don't add up on the dulera 200 and has one week left after last samples given on 5 01/07/16 (4 week supply) - very poor insight into formulary issues, turns out he's never turned in rx  for dulera so doesn't actually know how much it would cost or whether covered on present plan >>no changes   10/07/2016 NP Follow up : Chronic Severe Asthma / Pt returns for for 3 month follow up for asthma. Says he has been doing well . No flare of cough or wheezing.  No steroid use , ER visit or increased SABA use.  We reviewed his meds and organized them into a med calendar with pt education .  Appears to be taking correctly. Cost is an issue for him, depends on samples if available.  Still waiting on Nucala approval.  Denies chest pain, orthopnea, edema or fever.  Sings different places, going to [place] this weekend.  Declines flu shot .  Prevnar utd 2015, PVX utd 2010 .  rec Continue on current regimen .  Follow med calendar closely and bring to each visit.  Follow up Dr. Melvyn Novas in 3 months for physical with labs, come fasting.  Follow up with Dr. Annamaria Boots  As planned and As needed      12/31/2016 acute extended ov/Lavern Crimi re: acute asthma exac/ non -adherent due to cost of meds  Chief Complaint  Patient presents with  . Acute Visit    wheezing, cough with a little blood in sputum, about 7 days ago it started  ran out of dulera ? When/ on pred 20/ poor hva   Totally reliant on samples at this point and called to request dulera but we didn't have it but never reported he was completely out until day of ov/ much worse since with cough/ congestion with thick mucus slt blood tinged but not cp or resting sob  No obvious other patterns in day to day or daytime variability or assoc  cp or chest tightness, subjective wheeze or overt sinus or hb symptoms. No unusual exp hx or h/o childhood pna/ asthma or knowledge of premature birth.  Sleeping ok most nights when on meds  without nocturnal  or early am exacerbation  of respiratory  c/o's or need for noct saba. Also denies any obvious fluctuation of symptoms with weather or environmental changes or other aggravating or alleviating factors except as  outlined above   Current Medications, Allergies, Complete Past Medical History, Past Surgical History, Family History, and Social History were reviewed in [employer] [facility] record.  ROS  The following are not active complaints unless bolded sore throat, dysphagia, dental problems, itching, sneezing,  nasal congestion or excess/ purulent secretions, ear ache,   fever, chills, sweats, unintended wt loss, classically pleuritic or exertional cp, hemoptysis,  orthopnea pnd or leg swelling, presyncope, palpitations, abdominal pain, anorexia, nausea, vomiting, diarrhea  or change in bowel or bladder habits, change in stools or urine, dysuria,hematuria,  rash, arthralgias, visual complaints, headache, numbness, weakness or ataxia or problems with walking or coordination,  change in mood/affect or memory.                     Past Medical Hx ASTHMA (ICD-493.90)  --chronic severe asthma with best FEV1 2.2 L documented 01/2001 and  documented non-adherence Mainly lives off samples of inhalers and nose sprays  - HFA 75% June 15, 2009 > 90% August 02, 2009 > 100% December 27, 2009  - Off allergy vaccines x7/2010 > highly allergic skin tests per Dr Annamaria Boots > restarted vaccine November 06, 2009 > stopped  07/2013  SHOULDER PAIN, RIGHT (ICD-719.41)   HYPERTENSION, BORDERLINE (ICD-401.9)  Shingles T 8 Left 9/01  HEALTH MAINTENANCE...............................................................................................Marland KitchenWert  - Colonoscopy 12/1989,  2011  - dT 07/25/2014  - Pneumovax 3/04 and 05/2009 - Prevnar 07/25/14 -Flu declined October 17, 2008 and September 21, 2009 , declined flu shot 2015, 2017  -CPX 07/25/2014  - Meds reviewed with pt education and computerized med calendar adjusted last  01/08/2016 , 05/06/2016 , 10/07/2016            Objective:   Physical Exam        amb bm/ nad    05/23/2014  201> 201 07/04/2014 > 07/25/14  196>198 08/22/2014 >194 09/19/2014 >198 10/11/2014 >  10/24/2014  196>   01/02/2015 193 > 03/06/2015  198 >198 04/03/2015 > 05/16/2015 196 >  06/02/2015 196 >197 06/16/2015 > 12/12/2015 199> 197 01/08/2016 >   02/16/2016     194 > 04/08/2016  193> 08/06/2016    196 > 12/31/2016  191   Vital signs reviewed - Note on arrival 02 sats  95% on RA       HEENT: nl dentition,  and orophanx. Nl external ear canals without cough reflex, poor dentition - moderate bilateral turbinate non-specific edema pattern   NECK :  without JVD/Nodes/TM/ nl carotid upstrokes bilaterally   LUNGS: no acc muscle use, hyperresonant to percussion,  Distant BS bilaterally with pan exp wheeze bilaterally  CV:  RRR  no s3 or murmur or increase in P2, no edema   ABD:  soft and nontender with nl excursion in the supine position. No bruits or organomegaly, bowel sounds nl  MS:  Nl gait, warm without deformities, nl gait, no calf tenderness, cyanosis or clubbing - neg homan's

## 2016-12-31 NOTE — Patient Instructions (Addendum)
See calendar for specific medication instructions and bring it back for each and every office visit for every healthcare provider you see.  Without it, you may not receive the best quality medical care that we feel you deserve.  You will note that the calendar groups together  your maintenance  medications that are timed at particular times of the day.  Think of this as your checklist for what your doctor has instructed you to do until your next evaluation to see what benefit  there is  to staying on a consistent group of medications intended to keep you well.  The other group at the bottom is entirely up to you to use as you see fit  for specific symptoms that may arise between visits that require you to treat them on an as needed basis.  Think of this as your action plan or "what if" list.   Separating the top medications from the bottom group is fundamental to providing you adequate care going forward.    Work on inhaler technique:  relax and gently blow all the way out then take a nice smooth deep breath back in, triggering the inhaler at same time you start breathing in.  Hold for up to 5 seconds if you can. Blow out thru nose. Rinse and gargle with water when done.   Please schedule a follow up office visit in 4 weeks, sooner if needed bring all meds and med calendar  - see where we stand with nucala on return

## 2017-01-06 ENCOUNTER — Encounter: Payer: Self-pay | Admitting: Internal Medicine

## 2017-01-06 NOTE — Assessment & Plan Note (Signed)
Off allergy vaccines x7/2010 > highly allergic skin tests per Dr Annamaria Boots > restarted vaccine November 06, 2009  Allergy vaccine 1:50:000 10/30/09; adv to 1:10 11/20/10 GH. D/Cd 07/30/13 - allergy profile 12/12/15 >  Eos 0.3 /   IgE  243 multiple pos RAST with dog the worst  - 07/2016 approved for nucala   Need to  see where we stand with nucala rx when returns first with all meds in 4 weeks

## 2017-01-06 NOTE — Assessment & Plan Note (Signed)
--  chronic severe asthma with best FEV1 2.2 L documented 01/2001 and documented non-adherence Mainly lives off samples of inhalers and nose sprays  - Off allergy vaccines x7/2010 > highly allergic skin tests per Dr Annamaria Boots > restart vaccine November 06, 2009>stop 07/30/13 to watch - Spirometry on "good day" 01/14/12 =  FEV1 1.54  Ratio 0.58  - 02/16/2016 added prn prednisone when needing neb - FENO 04/23/16- 32  -CBC with differential 04/23/2016-eosinophils elevated 7.0 - 12/31/2016  After extensive coaching HFA effectiveness =    75%    I had an extended discussion with the patient reviewing all relevant studies completed to date and  lasting 15 to 20 minutes of a 25 minute visit on the following ongoing concerns:   1)  Cautioned that running out of meds was risky for M and M and makes no sense in term s of cost as the ER would cost a lot more  2) Formulary restrictions will be an ongoing challenge for the forseable future and I would be happy to pick an alternative if the pt will first  provide me a list of them but pt  will need to return here for training for any new device that is required eg dpi vs hfa vs respimat.    In meantime we can always provide samples so the patient never runs out of any needed respiratory medications - we have plenty of symbicort 160 which is a suitable substitute for dulera and gave him 4 week supply with plans to see back in 4 weeks  3) Prednisone 10 mg take  4 each am x 2 days,   2 each am x 2 days,  1 each am x 2 days and stop in addition to his usual pred rx using strategy of the lowest dose that works  4) Each maintenance medication was reviewed in detail including most importantly the difference between maintenance and as needed and under what circumstances the prns are to be used. This was done in the context of a medication calendar review which provided the patient with a user-friendly unambiguous mechanism for medication administration and reconciliation and  provides an action plan for all active problems. It is critical that this be shown to every doctor  for modification during the office visit if necessary so the patient can use it as a working document.     see avs for instructions unique to this ov

## 2017-01-07 ENCOUNTER — Ambulatory Visit: Payer: Medicare Other | Admitting: Internal Medicine

## 2017-01-13 DIAGNOSIS — S233XXA Sprain of ligaments of thoracic spine, initial encounter: Secondary | ICD-10-CM | POA: Diagnosis not present

## 2017-01-13 DIAGNOSIS — M9902 Segmental and somatic dysfunction of thoracic region: Secondary | ICD-10-CM | POA: Diagnosis not present

## 2017-01-17 DIAGNOSIS — S233XXA Sprain of ligaments of thoracic spine, initial encounter: Secondary | ICD-10-CM | POA: Diagnosis not present

## 2017-01-17 DIAGNOSIS — M9902 Segmental and somatic dysfunction of thoracic region: Secondary | ICD-10-CM | POA: Diagnosis not present

## 2017-01-28 ENCOUNTER — Encounter: Payer: Self-pay | Admitting: Internal Medicine

## 2017-01-28 ENCOUNTER — Ambulatory Visit (INDEPENDENT_AMBULATORY_CARE_PROVIDER_SITE_OTHER): Payer: Medicare Other | Admitting: Internal Medicine

## 2017-01-28 VITALS — BP 134/82 | HR 76 | Ht 68.0 in | Wt 191.6 lb

## 2017-01-28 DIAGNOSIS — J4550 Severe persistent asthma, uncomplicated: Secondary | ICD-10-CM | POA: Diagnosis not present

## 2017-01-28 NOTE — Patient Instructions (Signed)
See calendar for specific medication instructions and bring it back for each and every office visit for every healthcare provider you see.  Without it,  you may not receive the best quality medical care that we feel you deserve.  You will note that the calendar groups together  your maintenance  medications that are timed at particular times of the day.  Think of this as your checklist for what your doctor has instructed you to do until your next evaluation to see what benefit  there is  to staying on a consistent group of medications intended to keep you well.  The other group at the bottom is entirely up to you to use as you see fit  for specific symptoms that may arise between visits that require you to treat them on an as needed basis.  Think of this as your action plan or "what if" list.   Separating the top medications from the bottom group is fundamental to providing you adequate care going forward.    Please schedule a follow up office visit in 6 weeks, call sooner if needed with [name]  and bring all meds in two separate bags to confirm your med calendar reflects exactly what you are taking

## 2017-01-28 NOTE — Progress Notes (Signed)
Subjective:   Patient ID: [name]    DOB: December 12, 1941    Brief patient profile: 58  yobm denies ever smoking but has lifelong asthma with severe chronic pattern and best FEV1 of around 2.2 liters documented 2002 followed in pulmonary for primary care also for hbp and hyperlipidemia.   History of Present Illness    07/30/13 Allergy eval rx completed x 4 years > d/c'd and f/u allergy prn    04/03/2015 Follow up : chronic asthma/ HTN/non adherence / relies on samples/ no formulary / has med calendar  Pt returns for 1 month follow up .  Doing well with no asthma flare , on Dulera . Marland Kitchen  Need to establish with dentist > multiple teeth extracted   05/06/2016 NP Follow up ; Severe chronic asthma  Patient presents for 1 month follow up and med review We reviewed all his medications organize them into a medication count with patient education Appears he is taking medications correctly. However, does depend on samples. He did not have his Pepcid or Protonix today but says he has them at home. Says overall his breathing is doing well without any flare cough or wheezing. Seen by allergy Dr. Annamaria Boots on 04/23/16   , IgE 203, elevated eos Looking into Nucala.  Prevnar and PVX utd.  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 Is able to sing without sob.  rec No change rx   06/04/16 Dr Annamaria Boots rec Geradine Girt   08/06/2016  f/u ov/[name] re: chronic severe asthma / difficulty with adherence due to finances  Has been approved for nucala but hasn't started it Chief Complaint  Patient presents with  . Follow-up    pt states he is doing well, has increased SOB in heat/humid temps.    min need for saba hfa/ hardly ever neb/ only breathing problem out in heat  Appears to be completely dep on samples but the numbers don't add up on the dulera 200 and has one week left after last samples given on 5 01/07/16 (4 week supply) - very poor insight into formulary issues, turns out he's never turned in rx for  dulera so doesn't actually know how much it would cost or whether covered on present plan >>no changes   10/07/2016 NP Follow up : Chronic Severe Asthma / Pt returns for for 3 month follow up for asthma. Says he has been doing well . No flare of cough or wheezing.  No steroid use , ER visit or increased SABA use.  We reviewed his meds and organized them into a med calendar with pt education .  Appears to be taking correctly. Cost is an issue for him, depends on samples if available.  Still waiting on Nucala approval.  Denies chest pain, orthopnea, edema or fever.  Sings different places, going to [place] this weekend.  Declines flu shot .  Prevnar utd 2015, PVX utd 2010 .  rec Continue on current regimen .  Follow med calendar closely and bring to each visit.  Follow up Dr. Melvyn Novas in 3 months for physical with labs, come fasting.  Follow up with Dr. Annamaria Boots  As planned and As needed      12/31/2016 acute extended ov/[name] re: acute asthma exac/ non -adherent due to cost of meds  Chief Complaint  Patient presents with  . Acute Visit    wheezing, cough with a little blood in sputum, about 7 days ago it started    ran out of  dulera ? When/ on pred 20/ poor hva  rec ? Why hasn't started nucala     01/28/2017  f/u ov/[name] re: severe asthma/ still hasn't started nucala though per office notes it has been approved  Chief Complaint  Patient presents with  . Follow-up    pt doing well, c/o stable sob with exertion, prod cough   using saba but maybe once a day Took last pred one day prior to OV  Best he's felt in "a while"     No obvious   patterns in day to day or daytime variability or assoc  purulent sputum or mucus plugs    cp or chest tightness, subjective wheeze or overt sinus or hb symptoms. No unusual exp hx or h/o childhood pna/ asthma or knowledge of premature birth.  Sleeping ok most nights when on meds  without nocturnal  or early am exacerbation  of respiratory  c/o's or need for noct  saba. Also denies any obvious fluctuation of symptoms with weather or environmental changes or other aggravating or alleviating factors except as outlined above   Current Medications, Allergies, Complete Past Medical History, Past Surgical History, Family History, and Social History were reviewed in [employer] record.  ROS  The following are not active complaints unless bolded sore throat, dysphagia, dental problems, itching, sneezing,  nasal congestion or excess/ purulent secretions, ear ache,   fever, chills, sweats, unintended wt loss, classically pleuritic or exertional cp, hemoptysis,  orthopnea pnd or leg swelling, presyncope, palpitations, abdominal pain, anorexia, nausea, vomiting, diarrhea  or change in bowel or bladder habits, change in stools or urine, dysuria,hematuria,  rash, arthralgias, visual complaints, headache, numbness, weakness or ataxia or problems with walking or coordination,  change in mood/affect or memory.                  Past Medical Hx ASTHMA (ICD-493.90)  --chronic severe asthma with best FEV1 2.2 L documented 01/2001 and  documented non-adherence Mainly lives off samples of inhalers and nose sprays  - HFA 75% June 15, 2009 > 90% August 02, 2009 > 100% December 27, 2009  - Off allergy vaccines x7/2010 > highly allergic skin tests per Dr Annamaria Boots > restarted vaccine November 06, 2009 > stopped  07/2013  SHOULDER PAIN, RIGHT (ICD-719.41)   HYPERTENSION, BORDERLINE (ICD-401.9)  Shingles T 8 Left 9/01  HEALTH MAINTENANCE...............................................................................................Marland KitchenWert  - Colonoscopy 12/1989,  2011  - dT 07/25/2014  - Pneumovax 3/04 and 05/2009 - Prevnar 07/25/14 -Flu declined October 17, 2008 and September 21, 2009 , declined flu shot 2015, 2017  -CPX 07/25/2014  - Meds reviewed with pt education and computerized med calendar adjusted last  01/08/2016 , 05/06/2016 , 10/07/2016             Objective:   Physical Exam        amb bm/ nad    05/23/2014  201> 201 07/04/2014 > 07/25/14  196>198 08/22/2014 >194 09/19/2014 >198 10/11/2014 > 10/24/2014  196>   01/02/2015 193 > 03/06/2015  198 >198 04/03/2015 > 05/16/2015 196 >  06/02/2015 196 >197 06/16/2015 > 12/12/2015 199> 197 01/08/2016 >   02/16/2016     194 > 04/08/2016  193> 08/06/2016    196 > 12/31/2016  191 > 01/28/2017  191    Vital signs reviewed - Note on arrival 02 sats  95% on RA       HEENT: nl dentition,  and orophanx. Nl external ear canals without cough reflex, poor dentition -  moderate bilateral turbinate non-specific edema pattern   NECK :  without JVD/Nodes/TM/ nl carotid upstrokes bilaterally   LUNGS: no acc muscle use, hyperresonant to percussion,  Distant BS bilaterally with pan exp wheeze bilaterally    CV:  RRR  no s3 or murmur or increase in P2, no edema   ABD:  soft and nontender with nl excursion in the supine position. No bruits or organomegaly, bowel sounds nl  MS:  Nl gait, warm without deformities, nl gait, no calf tenderness, cyanosis or clubbing - neg homan's

## 2017-01-29 NOTE — Assessment & Plan Note (Signed)
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 We'll explore why this hasn't been started when are nurse who does the Nulcal returns  In meanitme  had an extended discussion with the patient reviewing all relevant studies completed to date and  lasting 15 to 20 minutes of a 25 minute visit    Each maintenance medication was reviewed in detail including most importantly the difference between maintenance and prns and under what circumstances the prns are to be triggered using an action plan format that is not reflected in the computer generated alphabetically organized AVS but trather by a customized med calendar that reflects the AVS meds with confirmed 100% correlation.   In addition, Please see AVS for unique instructions that I personally wrote and verbalized to the the pt in detail and then reviewed with pt  by my nurse highlighting any  changes in therapy recommended at today's visit to their plan of care.

## 2017-01-31 DIAGNOSIS — L723 Sebaceous cyst: Secondary | ICD-10-CM | POA: Diagnosis not present

## 2017-02-06 DIAGNOSIS — M791 Myalgia: Secondary | ICD-10-CM | POA: Diagnosis not present

## 2017-02-06 DIAGNOSIS — M9901 Segmental and somatic dysfunction of cervical region: Secondary | ICD-10-CM | POA: Diagnosis not present

## 2017-02-25 DIAGNOSIS — M9901 Segmental and somatic dysfunction of cervical region: Secondary | ICD-10-CM | POA: Diagnosis not present

## 2017-02-25 DIAGNOSIS — M791 Myalgia: Secondary | ICD-10-CM | POA: Diagnosis not present

## 2017-02-28 DIAGNOSIS — M9901 Segmental and somatic dysfunction of cervical region: Secondary | ICD-10-CM | POA: Diagnosis not present

## 2017-02-28 DIAGNOSIS — M791 Myalgia: Secondary | ICD-10-CM | POA: Diagnosis not present

## 2017-03-18 ENCOUNTER — Ambulatory Visit (INDEPENDENT_AMBULATORY_CARE_PROVIDER_SITE_OTHER): Payer: Medicare Other | Admitting: Adult Health

## 2017-03-18 DIAGNOSIS — J3089 Other allergic rhinitis: Secondary | ICD-10-CM

## 2017-03-18 DIAGNOSIS — J302 Other seasonal allergic rhinitis: Secondary | ICD-10-CM

## 2017-03-18 DIAGNOSIS — I10 Essential (primary) hypertension: Secondary | ICD-10-CM | POA: Diagnosis not present

## 2017-03-18 DIAGNOSIS — J4550 Severe persistent asthma, uncomplicated: Secondary | ICD-10-CM

## 2017-03-18 MED ORDER — BUDESONIDE-FORMOTEROL FUMARATE 160-4.5 MCG/ACT IN AERO: 2.0000 | INHALATION_SPRAY | Freq: Two times a day (BID) | RESPIRATORY_TRACT | 0 refills | Status: DC

## 2017-03-18 NOTE — Assessment & Plan Note (Signed)
Prone to exacerbation suspect secondary to medication noncompliance (cost issues /sample dependence)  Patient's medications were reviewed today and patient education was given. Computerized medication calendar was adjusted/completed  Unable to afford [place]  Patient Instructions  Continue on current regimen .  Follow med calendar closely and bring to each visit.  Follow up Dr. Melvyn Novas in 3 months for physical with labs, come fasting.  Low fat cholesterol diet.  Low salt diet.  Follow up with Dr. Annamaria Boots  As planned and As needed   Please contact office for sooner follow up if symptoms do not improve or worsen or seek emergency care

## 2017-03-18 NOTE — Progress Notes (Signed)
Chart and office note reviewed in detail  > agree with a/p as outlined    

## 2017-03-18 NOTE — Addendum Note (Signed)
Addended by: Parke Poisson E on: 03/18/2017 09:33 AM   Modules accepted: Orders

## 2017-03-18 NOTE — Assessment & Plan Note (Signed)
Currently controlled   Plan  Patient Instructions  Continue on current regimen .  Follow med calendar closely and bring to each visit.  Follow up Dr. Melvyn Novas in 3 months for physical with labs, come fasting.  Low fat cholesterol diet.  Low salt diet.  Follow up with Dr. Annamaria Boots  As planned and As needed   Please contact office for sooner follow up if symptoms do not improve or worsen or seek emergency care

## 2017-03-18 NOTE — Assessment & Plan Note (Signed)
Cont on current regimen  Return for labs and CPX

## 2017-03-18 NOTE — Patient Instructions (Signed)
Continue on current regimen .  Follow med calendar closely and bring to each visit.  Follow up Dr. Melvyn Novas in 3 months for physical with labs, come fasting.  Low fat cholesterol diet.  Low salt diet.  Follow up with Dr. Annamaria Boots  As planned and As needed   Please contact office for sooner follow up if symptoms do not improve or worsen or seek emergency care

## 2017-03-18 NOTE — Progress Notes (Signed)
@Patient  ID: [name], male    DOB: 1941-03-04, 76 y.o.   MRN: [id number]  Chief Complaint  Patient presents with  . Follow-up    Asthma     Referring provider: Tanda Rockers, MD  HPI: [id number]  yobm denies ever smoking but has lifelong asthma with severe chronic pattern and best FEV1 of around 2.2 liters documented 2002 followed in pulmonary for primary care also for hbp and hyperlipidemia.  TEST  --chronic severe asthma with best FEV1 2.2 L documented 01/2001 and documented non-adherence Mainly lives off samples of inhalers and nose sprays  - Off allergy vaccines x7/2010 > highly allergic skin tests per Dr Annamaria Boots > restart vaccine November 06, 2009>stop 07/30/13 to watch - Spirometry on "good day" 01/14/12 =  FEV1 1.54  Ratio 0.58  - 02/16/2016 added prn prednisone when needing neb - FENO 04/23/16- 32  -CBC with differential 04/23/2016-eosinophils elevated 7.090%  - Spirometry 01/28/2017  FEV1 1.23 (49%)  Ratio 60     03/18/2017 Follow up : Chronic Severe Asthma  Pt returns for 6 week follow up for asthma . Says he is doing well since last ov , with no flare of cough or wheezing . No increased SABA use.  Currently using Symbicort (instead of Dulera)  He was approved for Nucala last year but was unable to afford. It (pt responsibility was %600).  Says activity level is at baseline .   We reviewed all his meds and organized them into a med calendar with pt education . Appearing to be taking some of his meds correctly when he has samples, depends on samples mainly .  We discussed alternative options but it is hard for him to afford his meds.    No Known Allergies  Immunization History  Administered Date(s) Administered  . Pneumococcal Conjugate-13 07/25/2014  . Pneumococcal Polysaccharide-23 12/23/2008  . Tdap 07/25/2014    Past Medical History:  Diagnosis Date  . Acid reflux   . Asthma 06/18/2011  . Borderline hypertension   . Cataract   . Colon polyps 2011   1 Hyperplastic  and 1 Tubular Adenoma   . Dysphagia   . Esophageal stricture   . GERD (gastroesophageal reflux disease)   . Hx of adenomatous colonic polyps 06/25/2015  . Personal history of colonic polyps 12/1989  . Shingles   . Shoulder pain, right   . Unspecified asthma(493.90)   . Urinary frequency     Tobacco History: History  Smoking Status  . Never Smoker  Smokeless Tobacco  . Never Used   Counseling given: Not Answered   Outpatient Encounter Prescriptions as of 03/18/2017  Medication Sig  . albuterol (PROAIR HFA) 108 (90 Base) MCG/ACT inhaler 2 puffs every 4 hours as needed for wheezing ((PLANB))  . albuterol (PROVENTIL) (2.5 MG/3ML) 0.083% nebulizer solution USE 1 VIAL IN NEBULIZER EVERY 4 HOURS AS NEEDED (Patient taking differently: USE 1 VIAL IN NEBULIZER EVERY 4 HOURS AS NEEDED FOR WHEEZING ((PLAN C)))  . aspirin 81 MG EC tablet Take 81 mg by mouth every morning.   . budesonide-formoterol (SYMBICORT) 160-4.5 MCG/ACT inhaler Inhale 2 puffs into the lungs 2 (two) times daily.  . famotidine (PEPCID) 20 MG tablet Take 20 mg by mouth at bedtime.   . fluticasone (FLONASE) 50 MCG/ACT nasal spray Place 1 spray into both nostrils 2 (two) times daily.   Marland Kitchen guaifenesin (MUCUS RELIEF) 400 MG TABS tablet Take 2-3 tablets every 12 hours as needed for thick mucus/congestion and cough  .  HYDROcodone-acetaminophen (NORCO) 5-325 MG tablet Take 1 tablet by mouth every 4 (four) hours as needed for moderate pain.  . mometasone (NASONEX) 50 MCG/ACT nasal spray Place 1 spray into the nose 2 (two) times daily.  . Naproxen Sodium (ALEVE) 220 MG CAPS Per bottle as needed for joint pain  . olmesartan (BENICAR) 40 MG tablet Take 1/2 tablet by mouth every morning  . oxymetazoline (AFRIN) 0.05 % nasal spray 2 Puffs twice daliy x 5 days, off x 2 weeks for nasal stuffiness  . pantoprazole (PROTONIX) 40 MG tablet TAKE 1 TABLET BY MOUTH EVERY DAY BEFORE BREAKFAST  . promethazine-codeine (PHENERGAN WITH CODEINE) 6.25-10  MG/5ML syrup Take 2 tsp every 4 hours as needed for severe cough  . Respiratory Therapy Supplies (FLUTTER) DEVI Use every 4 hours as needed for cough and congestion  . Simethicone (GAS-X EXTRA STRENGTH) 125 MG CAPS Per box as needed for gas and bloating   No facility-administered encounter medications on file as of 03/18/2017.      Review of Systems  Constitutional:   No  weight loss, night sweats,  Fevers, chills, fatigue, or  lassitude.  HEENT:   No headaches,  Difficulty swallowing,  Tooth/dental problems, or  Sore throat,                No sneezing, itching, ear ache,  +nasal congestion, post nasal drip,   CV:  No chest pain,  Orthopnea, PND, swelling in lower extremities, anasarca, dizziness, palpitations, syncope.   GI  No heartburn, indigestion, abdominal pain, nausea, vomiting, diarrhea, change in bowel habits, loss of appetite, bloody stools.   Resp: No shortness of breath with exertion or at rest.  No excess mucus, no productive cough,  No non-productive cough,  No coughing up of blood.  No change in color of mucus.  No wheezing.  No chest wall deformity  Skin: no rash or lesions.  GU: no dysuria, change in color of urine, no urgency or frequency.  No flank pain, no hematuria   MS:  No joint pain or swelling.  No decreased range of motion.  No back pain.    Physical Exam  BP 138/84 (BP Location: Left Arm, Cuff Size: Normal)   Pulse 75   SpO2 95%   GEN: A/Ox3; pleasant , NAD, well nourished, elderly    HEENT:  [place]/AT,  EACs-clear, TMs-wnl, NOSE-clear, THROAT-clear, no lesions, no postnasal drip or exudate noted.   NECK:  Supple w/ fair ROM; no JVD; normal carotid impulses w/o bruits; no thyromegaly or nodules palpated; no lymphadenopathy.    RESP  Clear  P & A; w/o, wheezes/ rales/ or rhonchi. no accessory muscle use, no dullness to percussion  CARD:  RRR, no m/r/g, no peripheral edema, pulses intact, no cyanosis or clubbing.  GI:   Soft & nt; nml bowel sounds; no  organomegaly or masses detected.   Musco: Warm bil, no deformities or joint swelling noted.   Neuro: alert, no focal deficits noted.    Skin: Warm, no lesions or rashes    Lab Results:   BNP No results found for: BNP  ProBNP No results found for: PROBNP  Imaging: No results found.   Assessment & Plan:   Severe persistent asthma Prone to exacerbation suspect secondary to medication noncompliance (cost issues /sample dependence)  Patient's medications were reviewed today and patient education was given. Computerized medication calendar was adjusted/completed  Unable to afford [place]  Patient Instructions  Continue on current regimen .  Follow med calendar closely and bring to each visit.  Follow up Dr. Melvyn Novas in 3 months for physical with labs, come fasting.  Low fat cholesterol diet.  Low salt diet.  Follow up with Dr. Annamaria Boots  As planned and As needed   Please contact office for sooner follow up if symptoms do not improve or worsen or seek emergency care      Seasonal and perennial allergic rhinitis Currently controlled   Plan  Patient Instructions  Continue on current regimen .  Follow med calendar closely and bring to each visit.  Follow up Dr. Melvyn Novas in 3 months for physical with labs, come fasting.  Low fat cholesterol diet.  Low salt diet.  Follow up with Dr. Annamaria Boots  As planned and As needed   Please contact office for sooner follow up if symptoms do not improve or worsen or seek emergency care      Essential hypertension, benign Cont on current regimen  Return for labs and CPX      Rexene Edison, NP 03/18/2017

## 2017-03-28 NOTE — Addendum Note (Signed)
Addended by: Lorane Gell on: 03/28/2017 03:59 PM   Modules accepted: Orders

## 2017-06-19 ENCOUNTER — Telehealth: Payer: Self-pay | Admitting: Internal Medicine

## 2017-06-19 ENCOUNTER — Ambulatory Visit (INDEPENDENT_AMBULATORY_CARE_PROVIDER_SITE_OTHER)
Admission: RE | Admit: 2017-06-19 | Discharge: 2017-06-19 | Disposition: A | Discharge status: Home / Self Care | Payer: Medicare Other | Source: Ambulatory Visit | Attending: Internal Medicine | Admitting: Internal Medicine

## 2017-06-19 ENCOUNTER — Ambulatory Visit (INDEPENDENT_AMBULATORY_CARE_PROVIDER_SITE_OTHER): Payer: Medicare Other | Admitting: Internal Medicine

## 2017-06-19 ENCOUNTER — Encounter: Payer: Self-pay | Admitting: Internal Medicine

## 2017-06-19 ENCOUNTER — Other Ambulatory Visit (INDEPENDENT_AMBULATORY_CARE_PROVIDER_SITE_OTHER): Payer: Medicare Other

## 2017-06-19 VITALS — BP 124/84 | HR 67 | Ht 68.0 in | Wt 192.0 lb

## 2017-06-19 DIAGNOSIS — E7800 Pure hypercholesterolemia, unspecified: Secondary | ICD-10-CM

## 2017-06-19 DIAGNOSIS — J4550 Severe persistent asthma, uncomplicated: Secondary | ICD-10-CM | POA: Diagnosis not present

## 2017-06-19 DIAGNOSIS — I10 Essential (primary) hypertension: Secondary | ICD-10-CM | POA: Diagnosis not present

## 2017-06-19 DIAGNOSIS — N183 Chronic kidney disease, stage 3 unspecified: Secondary | ICD-10-CM

## 2017-06-19 DIAGNOSIS — J45909 Unspecified asthma, uncomplicated: Secondary | ICD-10-CM | POA: Diagnosis not present

## 2017-06-19 DIAGNOSIS — J302 Other seasonal allergic rhinitis: Secondary | ICD-10-CM

## 2017-06-19 DIAGNOSIS — J3089 Other allergic rhinitis: Secondary | ICD-10-CM | POA: Diagnosis not present

## 2017-06-19 LAB — BASIC METABOLIC PANEL
BUN: 11 mg/dL (ref 6–23)
CO2: 28 mEq/L (ref 19–32)
Calcium: 9.6 mg/dL (ref 8.4–10.5)
Chloride: 108 mEq/L (ref 96–112)
Creatinine, Ser: 1.3 mg/dL (ref 0.40–1.50)
GFR: 68.82 mL/min (ref >60.00)
Glucose, Bld: 99 mg/dL (ref 70–99)
POTASSIUM: 3.9 meq/L (ref 3.5–5.1)
SODIUM: 145 meq/L (ref 135–145)

## 2017-06-19 LAB — CBC WITH DIFFERENTIAL/PLATELET
BASOS ABS: 0.1 10*3/uL (ref 0.0–0.1)
BASOS PCT: 1.1 % (ref 0.0–3.0)
EOS ABS: 0.5 10*3/uL (ref 0.0–0.7)
Eosinophils Relative: 5.7 % — ABNORMAL HIGH (ref 0.0–5.0)
HCT: 42.9 % (ref 39.0–52.0)
Hemoglobin: 14.5 g/dL (ref 13.0–17.0)
LYMPHS ABS: 3.4 10*3/uL (ref 0.7–4.0)
Lymphocytes Relative: 42.4 % (ref 12.0–46.0)
MCHC: 33.7 g/dL (ref 30.0–36.0)
MCV: 87.5 fl (ref 78.0–100.0)
Monocytes Absolute: 0.6 10*3/uL (ref 0.1–1.0)
Monocytes Relative: 7.7 % (ref 3.0–12.0)
NEUTROS ABS: 3.4 10*3/uL (ref 1.4–7.7)
NEUTROS PCT: 43.1 % (ref 43.0–77.0)
PLATELETS: 352 10*3/uL (ref 150.0–400.0)
RBC: 4.9 Mil/uL (ref 4.22–5.81)
RDW: 13.9 % (ref 11.5–15.5)
WBC: 7.9 10*3/uL (ref 4.0–10.5)

## 2017-06-19 LAB — URINALYSIS
Bilirubin Urine: NEGATIVE
Ketones, ur: NEGATIVE
Leukocytes, UA: NEGATIVE
Nitrite: NEGATIVE
PH: 6 (ref 5.0–8.0)
Specific Gravity, Urine: 1.01 (ref 1.000–1.030)
TOTAL PROTEIN, URINE-UPE24: NEGATIVE
Urine Glucose: NEGATIVE
Urobilinogen, UA: 0.2 (ref 0.0–1.0)

## 2017-06-19 LAB — LIPID PANEL
CHOLESTEROL: 221 mg/dL — AB (ref 0–200)
HDL: 43.9 mg/dL (ref >39.00)
LDL Cholesterol: 141 mg/dL — ABNORMAL HIGH (ref 0–99)
NonHDL: 177.49
TRIGLYCERIDES: 183 mg/dL — AB (ref 0.0–149.0)
Total CHOL/HDL Ratio: 5
VLDL: 36.6 mg/dL (ref 0.0–40.0)

## 2017-06-19 LAB — HEPATIC FUNCTION PANEL
ALBUMIN: 4.3 g/dL (ref 3.5–5.2)
ALT: 11 U/L (ref 0–53)
AST: 15 U/L (ref 0–37)
Alkaline Phosphatase: 71 U/L (ref 39–117)
Bilirubin, Direct: 0.1 mg/dL (ref 0.0–0.3)
TOTAL PROTEIN: 6.8 g/dL (ref 6.0–8.3)
Total Bilirubin: 0.9 mg/dL (ref 0.2–1.2)

## 2017-06-19 LAB — TSH: TSH: 1.52 u[IU]/mL (ref 0.35–4.50)

## 2017-06-19 NOTE — Progress Notes (Signed)
Left detailed

## 2017-06-19 NOTE — Progress Notes (Signed)
Subjective:   Patient ID: [name]    DOB: 26-Jul-1941     Brief patient profile: 15  yobm denies ever smoking but has lifelong asthma with severe chronic pattern and best FEV1 of around 2.2 liters documented 2002 followed in pulmonary for primary care also for hbp and hyperlipidemia.   History of Present Illness    07/30/13 Allergy eval rx completed x 4 years > d/c'd and f/u allergy prn    04/03/2015 Follow up : chronic asthma/ HTN/non adherence / relies on samples/ no formulary / has med calendar  Pt returns for 1 month follow up .  Doing well with no asthma flare , on Dulera . Marland Kitchen  Need to establish with dentist > multiple teeth extracted   05/06/2016 NP Follow up ; Severe chronic asthma  Patient presents for 1 month follow up and med review We reviewed all his medications organize them into a medication count with patient education Appears he is taking medications correctly. However, does depend on samples. He did not have his Pepcid or Protonix today but says he has them at home. Says overall his breathing is doing well without any flare cough or wheezing. Seen by allergy Dr. Annamaria Boots on 04/23/16   , IgE 203, elevated eos Looking into Nucala.  Prevnar and PVX utd.  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 Is able to sing without sob.  rec No change rx   06/04/16 Dr Annamaria Boots rec [name]   08/06/2016  f/u ov/[name] re: chronic severe asthma / difficulty with adherence due to finances  Has been approved for nucala but hasn't started it Chief Complaint  Patient presents with  . Follow-up    pt states he is doing well, has increased SOB in heat/humid temps.    min need for saba hfa/ hardly ever neb/ only breathing problem out in heat  Appears to be completely dep on samples but the numbers don't add up on the dulera 200 and has one week left after last samples given on 5 01/07/16 (4 week supply) - very poor insight into formulary issues, turns out he's never turned in rx for  dulera so doesn't actually know how much it would cost or whether covered on present plan >>no changes   10/07/2016 NP Follow up : Chronic Severe Asthma / Pt returns for for 3 month follow up for asthma. Says he has been doing well . No flare of cough or wheezing.  No steroid use , ER visit or increased SABA use.  We reviewed his meds and organized them into a med calendar with pt education .  Appears to be taking correctly. Cost is an issue for him, depends on samples if available.  Still waiting on Nucala approval.  Denies chest pain, orthopnea, edema or fever.  Sings different places, going to [place] this weekend.  Declines flu shot .  Prevnar utd 2015, PVX utd 2010 .  rec Continue on current regimen .  Follow med calendar closely and bring to each visit.  Follow up Dr. Melvyn Novas in 3 months for physical with labs, come fasting.  Follow up with Dr. Annamaria Boots  As planned and As needed      12/31/2016 acute extended ov/[name] re: acute asthma exac/ non -adherent due to cost of meds  Chief Complaint  Patient presents with  . Acute Visit    wheezing, cough with a little blood in sputum, about 7 days ago it started    ran out  of dulera ? When/ on pred 20/ poor hva  rec ? Why hasn't started nucala     01/28/2017  f/u ov/[name] re: severe asthma/ still hasn't started nucala though per office notes it has been approved  Chief Complaint  Patient presents with  . Follow-up    pt doing well, c/o stable sob with exertion, prod cough   using saba but maybe once a day Took last pred one day prior to OV  Best he's felt in "a while"  rec Continue on current regimen .  Follow med calendar closely and bring to each visit.  Follow up Dr. Melvyn Novas in 3 months for physical with labs, come fasting.  Low fat cholesterol diet.  Low salt diet.     06/19/2017  f/u ov/[name] re: severe asthma/ no recent pred rx / allergic rhinitis no longer on shots/ hbp on samples of benicar Chief Complaint  Patient presents with  .  Follow-up    Pt states that he is doing well and no co's today. He is using his proair 2 x per wk on average. He has not had to use his neb.    appears to be dep on samples though has insurance / very poor insight in how to use it / does not know if he has a formulary  No ex cp/ tia or claudication   Not limited by breathing from desired activities  But very sedentary / rare need for saba while on symb 160 2bid   No obvious day to day or daytime variability or assoc excess/ purulent sputum or mucus plugs or hemoptysis or cp or chest tightness, subjective wheeze or overt sinus or hb symptoms. No unusual exp hx or h/o childhood pna/ asthma or knowledge of premature birth.  Sleeping ok without nocturnal  or early am exacerbation  of respiratory  c/o's or need for noct saba. Also denies any obvious fluctuation of symptoms with weather or environmental changes or other aggravating or alleviating factors except as outlined above   Current Medications, Allergies, Complete Past Medical History, Past Surgical History, Family History, and Social History were reviewed in [employer] record.  ROS  The following are not active complaints unless bolded sore throat, dysphagia, dental problems, itching, sneezing,  nasal congestion or excess/ purulent secretions, ear ache,   fever, chills, sweats, unintended wt loss, classically pleuritic or exertional cp,  orthopnea pnd or leg swelling, presyncope, palpitations, abdominal pain, anorexia, nausea, vomiting, diarrhea  or change in bowel or bladder habits, change in stools or urine, dysuria,hematuria,  rash, arthralgias, visual complaints, headache, numbness, weakness or ataxia or problems with walking or coordination,  change in mood/affect or memory.                                Past Medical Hx ASTHMA (ICD-493.90)  --chronic severe asthma with best FEV1 2.2 L documented 01/2001 and  documented non-adherence Mainly lives off  samples of inhalers and nose sprays  - HFA 75% June 15, 2009 > 90% August 02, 2009 > 100% December 27, 2009  - Off allergy vaccines x7/2010 > highly allergic skin tests per Dr Annamaria Boots > restarted vaccine November 06, 2009 > stopped  07/2013  SHOULDER PAIN, RIGHT (ICD-719.41)   HYPERTENSION, BORDERLINE (ICD-401.9)  Shingles T 8 Left 9/01  HEALTH MAINTENANCE...............................................................................................Marland KitchenWert  - Colonoscopy 12/1989,  2011  - dT 07/25/2014  - Pneumovax 3/04 and 05/2009 - Prevnar 07/25/14 -  Flu declined October 17, 2008 and September 21, 2009 , declined flu shot 2015, 2017  -CPX 07/25/2014  - Meds reviewed with pt education and computerized med calendar adjusted last  01/08/2016 , 05/06/2016 , 10/07/2016            Objective:   Physical Exam        amb bm/ nad    05/23/2014  201> 201 07/04/2014 > 07/25/14  196>198 08/22/2014 >194 09/19/2014 >198 10/11/2014 > 10/24/2014  196>   01/02/2015 193 > 03/06/2015  198 >198 04/03/2015 > 05/16/2015 196 >  06/02/2015 196 >197 06/16/2015 > 12/12/2015 199> 197 01/08/2016 >   02/16/2016     194 > 04/08/2016  193> 08/06/2016    196 > 12/31/2016  191 > 01/28/2017  191  > 06/19/2017  192    Vital signs reviewed - Note on arrival 02 sats  95% on RA       HEENT: nl dentition,  and oropharynx. Nl external ear canals without cough reflex, poor dentition - moderate bilateral turbinate non-specific edema pattern   NECK :  without JVD/Nodes/TM/ nl carotid upstrokes bilaterally   LUNGS: no acc muscle use, hyperresonant to percussion,  Distant BS bilaterally with late exp wheeze bilaterally    CV:  RRR  no s3 or murmur or increase in P2, no edema   ABD:  soft and nontender with nl excursion in the supine position. No bruits or organomegaly, bowel sounds nl  MS:  Nl gait, warm without deformities, nl gait, no calf tenderness, cyanosis or clubbing - neg homan's   Neuro:  Child like affect (baseline) approp otherwise with nl  sensorium, no motor def or pathologic reflexes  GU :  Testes down bilaterally, no nodules/ neg IH  RECTAL  Mild/ mod bph/ smooth s nodules/ stool guaiac neg    ekg 06/19/2017  Nsr/ wnl  Labs ordered/ reviewed:      Chemistry      Component Value Date/Time   NA 145 06/19/2017 0938   K 3.9 06/19/2017 0938   CL 108 06/19/2017 0938   CO2 28 06/19/2017 0938   BUN 11 06/19/2017 0938   CREATININE 1.30 06/19/2017 0938      Component Value Date/Time   CALCIUM 9.6 06/19/2017 0938   ALKPHOS 71 06/19/2017 0938   AST 15 06/19/2017 0938   ALT 11 06/19/2017 0938   BILITOT 0.9 06/19/2017 0938        Lab Results  Component Value Date   WBC 7.9 06/19/2017   HGB 14.5 06/19/2017   HCT 42.9 06/19/2017   MCV 87.5 06/19/2017   PLT 352.0 06/19/2017         Lab Results  Component Value Date   TSH 1.52 06/19/2017

## 2017-06-19 NOTE — Patient Instructions (Addendum)
Leave off the protonix and just take pepcid 20 mg at bfast and bedtime   Please remember to go to the lab and x-ray department downstairs in the basement  for your tests - we will call you with the results when they are available.     See [name] in 4 weeks with your drug formulary and  all your medications, even over the counter meds, separated in two separate bags, the ones you take no matter what vs the ones you stop once you feel better and take only as needed when you feel you need them.   Tammy  will generate for you a new user friendly medication calendar that will put [place] all on the same page re: your medication use.     Without this process, it simply isn't possible to assure that we are providing  your outpatient care  with  the attention to detail we feel you deserve.   If we cannot assure that you're getting that kind of care,  then we cannot manage your problem effectively from this clinic.  Once you have seen Tammy and we are sure that we're all on the same page with your medication use she will arrange follow up with me.

## 2017-06-19 NOTE — Telephone Encounter (Signed)
Pt aware of lab results.  Nothing further needed. 

## 2017-06-19 NOTE — Progress Notes (Signed)
Spoke with [name] and notified of results per Dr. Wert. [name] verbalized understanding and denied any questions. 

## 2017-06-22 DIAGNOSIS — N1831 Chronic kidney disease, stage 3a: Secondary | ICD-10-CM | POA: Insufficient documentation

## 2017-06-22 DIAGNOSIS — N183 Chronic kidney disease, stage 3 unspecified: Secondary | ICD-10-CM | POA: Insufficient documentation

## 2017-06-22 NOTE — Assessment & Plan Note (Signed)
Lab Results  Component Value Date   CREATININE 1.30 06/19/2017   CREATININE 1.37 (H) 03/10/2016   CREATININE 1.43 (H) 05/30/2015     Estimated Creatinine Clearance: 51.1 mL/min (by C-G formula based on SCr of 1.3 mg/dL).    Adequate control on present rx, reviewed in detail with pt > no change in rx needed

## 2017-06-22 NOTE — Assessment & Plan Note (Signed)
-   Target LDL < 130 due to h/o hbp/ male gender  Lab Results  Component Value Date   CHOL 221 (H) 06/19/2017   HDL 43.90 06/19/2017   LDLCALC 141 (H) 06/19/2017   LDLDIRECT 124.7 04/30/2013   TRIG 183.0 (H) 06/19/2017   CHOLHDL 5 06/19/2017    Adequate control on present rx, reviewed in detail with pt > no change in rx needed

## 2017-06-22 NOTE — Assessment & Plan Note (Addendum)
--  chronic severe asthma with best FEV1 2.2 L documented 01/2001 and documented non-adherence Mainly lives off samples of inhalers and nose sprays  - Off allergy vaccines x7/2010 > highly allergic skin tests per Dr Annamaria Boots > restart vaccine November 06, 2009>stop 07/30/13 to watch - Spirometry on "good day" 01/14/12 =  FEV1 1.54  Ratio 0.58  - 02/16/2016 added prn prednisone when needing neb - FENO 04/23/16- 32  -CBC with differential 04/23/2016-eosinophils elevated 7.090%  - Spirometry 01/28/2017  FEV1 1.23 (49%)  Ratio 60    - 06/19/2017  After extensive coaching HFA effectiveness =    75% from a baseline of 25%   Continues to be difficult to control. DDX of  difficult airways management almost all start with A and  include Adherence, Ace Inhibitors, Acid Reflux, Active Sinus Disease, Alpha 1 Antitripsin deficiency, Anxiety masquerading as Airways dz,  ABPA,  Allergy(esp in young), Aspiration (esp in elderly), Adverse effects of meds,  Active smokers, A bunch of PE's (a small clot burden can't cause this syndrome unless there is already severe underlying pulm or vascular dz with poor reserve) plus two Bs  = Bronchiectasis and Beta blocker use..and one C= CHF   Adherence is always the initial "prime suspect" and is a multilayered concern that requires a "trust but verify" approach in every patient - starting with knowing how to use medications, especially inhalers, correctly, keeping up with refills and understanding the fundamental difference between maintenance and prns vs those medications only taken for a very short course and then stopped and not refilled.  - see hfa teaching above - way too dependent on samples: Formulary restrictions will be an ongoing challenge for the forseable future and I would be happy to pick an alternative if the pt will first  provide me a list of them but pt  will need to return here for training for any new device that is required eg dpi vs hfa vs respimat.    In meantime we  can always provide samples so the patient never runs out of any needed respiratory medications.    Allergy > no longer on shots but no need to visit this again if not using topicals appropriately and for now just use short course pred prn   ? Acid (or non-acid) GERD > always difficult to exclude as up to 75% of pts in some series report no assoc GI/ Heartburn symptoms but pt apparently can't afford ppi > change to h2 bid   acid suppression and diet restrictions/ reviewed      I had an extended discussion with the patient reviewing all relevant studies completed to date and  lasting 15 to 20 minutes of a 25 minute visit    Each maintenance medication was reviewed in detail including most importantly the difference between maintenance and prns and under what circumstances the prns are to be triggered using an action plan format that is not reflected in the computer generated alphabetically organized AVS but trather by a customized med calendar that reflects the AVS meds with confirmed 100% correlation.   In addition, Please see AVS for unique instructions that I personally wrote and verbalized to the the pt in detail and then reviewed with pt  by my nurse highlighting any  changes in therapy recommended at today's visit to their plan of care.

## 2017-06-22 NOTE — Assessment & Plan Note (Signed)
Off allergy vaccines x7/2010 > highly allergic skin tests per Dr Annamaria Boots > restarted vaccine November 06, 2009  Allergy vaccine 1:50:000 10/30/09; adv to 1:10 11/20/10 GH. D/Cd 07/30/13 - allergy profile 12/12/15 >  Eos 0.3 /   IgE  243 multiple pos RAST with dog the worst  - 07/2016 approved for nucala  But could not afford deductible   No change rx for now, consider refer to [place] prn

## 2017-06-22 NOTE — Assessment & Plan Note (Signed)
Estimated Creatinine Clearance: 51.1 mL/min (by C-G formula based on SCr of 1.3 mg/dL).   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

## 2017-06-24 ENCOUNTER — Telehealth: Payer: Self-pay | Admitting: Internal Medicine

## 2017-06-24 NOTE — Telephone Encounter (Signed)
Notes recorded by Tanda Rockers, MD on 06/19/2017 at 1:25 PM EDT Call patient : Studies are unremarkable, no change in recs --------- Spoke with pt, aware of results/recs.  Nothing further needed.

## 2017-07-24 ENCOUNTER — Ambulatory Visit (INDEPENDENT_AMBULATORY_CARE_PROVIDER_SITE_OTHER): Payer: Medicare Other | Admitting: Adult Health

## 2017-07-24 ENCOUNTER — Encounter: Payer: Self-pay | Admitting: Adult Health

## 2017-07-24 DIAGNOSIS — I10 Essential (primary) hypertension: Secondary | ICD-10-CM

## 2017-07-24 DIAGNOSIS — J4550 Severe persistent asthma, uncomplicated: Secondary | ICD-10-CM

## 2017-07-24 DIAGNOSIS — E7800 Pure hypercholesterolemia, unspecified: Secondary | ICD-10-CM

## 2017-07-24 NOTE — Progress Notes (Signed)
@Patient  ID: [name], male    DOB: 11-27-1941, 76 y.o.   MRN: [id number]  Chief Complaint  Patient presents with  . Follow-up    Asthma     Referring provider: Tanda Rockers, MD  HPI: 55 yobm denies ever smoking but has lifelong asthma with severe chronic pattern and best FEV1 of around 2.2 liters documented 2002 followed in pulmonary for primary care also for hbp and hyperlipidemia.  TEST  --chronic severe asthma with best FEV1 2.2 L documented 01/2001 and documented non-adherence Mainly lives off samples of inhalers and nose sprays - Off allergy vaccines x7/2010 >highly allergic skin tests per Dr Annamaria Boots >restart vaccine November 06, 2009>stop 07/30/13 to watch - Spirometry on "good day" 01/14/12 = FEV1 1.54 Ratio 0.58  - 2/24/2017added prn prednisone when needing neb - FENO 04/23/16- 32  -CBC with differential 04/23/2016-eosinophils elevated 7.090%  - Spirometry 2/6/2018FEV1 1.23 (49%) Ratio 60   07/24/2017 Follow up : Asthma, HTN  Patient returns for a one-month follow-up. Patient has known asthma. He denies any flare cough or wheezing. He is on Symbicort currently. He does depend on samples quite a bit. Finances is the issue for this patient. He admits that he does miss some doses when he runs out of samples. Discussed medication compliance.  We reviewed all his medications organize them into a medication calendar with patient education.   No Known Allergies  Immunization History  Administered Date(s) Administered  . Pneumococcal Conjugate-13 07/25/2014  . Pneumococcal Polysaccharide-23 12/23/2008  . Tdap 07/25/2014    Past Medical History:  Diagnosis Date  . Acid reflux   . Asthma 06/18/2011  . Borderline hypertension   . Cataract   . Colon polyps 2011   1 Hyperplastic and 1 Tubular Adenoma   . Dysphagia   . Esophageal stricture   . GERD (gastroesophageal reflux disease)   . Hx of adenomatous colonic polyps 06/25/2015  . Personal history of colonic  polyps 12/1989  . Shingles   . Shoulder pain, right   . Unspecified asthma(493.90)   . Urinary frequency     Tobacco History: History  Smoking Status  . Never Smoker  Smokeless Tobacco  . Never Used   Counseling given: Not Answered   Outpatient Encounter Prescriptions as of 07/24/2017  Medication Sig  . albuterol (PROAIR HFA) 108 (90 Base) MCG/ACT inhaler 2 puffs every 4 hours as needed for wheezing ((PLANB))  . albuterol (PROVENTIL) (2.5 MG/3ML) 0.083% nebulizer solution USE 1 VIAL IN NEBULIZER EVERY 4 HOURS AS NEEDED (Patient taking differently: USE 1 VIAL IN NEBULIZER EVERY 4 HOURS AS NEEDED FOR WHEEZING ((PLAN C)))  . aspirin 81 MG EC tablet Take 81 mg by mouth every morning.   . budesonide-formoterol (SYMBICORT) 160-4.5 MCG/ACT inhaler Inhale 2 puffs into the lungs 2 (two) times daily.  . famotidine (PEPCID) 20 MG tablet Take 20 mg by mouth at bedtime.   . fluticasone (FLONASE) 50 MCG/ACT nasal spray Place 1 spray into both nostrils 2 (two) times daily.   Marland Kitchen guaifenesin (MUCUS RELIEF) 400 MG TABS tablet Take 2-3 tablets every 12 hours as needed for thick mucus/congestion and cough  . ibuprofen (ADVIL,MOTRIN) 200 MG tablet Take 200 mg by mouth every 6 (six) hours as needed.  . Naproxen Sodium (ALEVE) 220 MG CAPS Per bottle as needed for joint pain  . olmesartan (BENICAR) 40 MG tablet Take 1/2 tablet by mouth every morning  . oxymetazoline (AFRIN) 0.05 % nasal spray 2 Puffs twice daliy x 5 days,  off x 2 weeks for nasal stuffiness  . pantoprazole (PROTONIX) 40 MG tablet TAKE 1 TABLET BY MOUTH EVERY DAY BEFORE BREAKFAST  . predniSONE (DELTASONE) 10 MG tablet 2 until better, 1 daily x 3 days, stop----Pt uses as needed for asthma flares.  . promethazine-codeine (PHENERGAN WITH CODEINE) 6.25-10 MG/5ML syrup Take 2 tsp every 4 hours as needed for severe cough  . Respiratory Therapy Supplies (FLUTTER) DEVI Use every 4 hours as needed for cough and congestion  . Simethicone (GAS-X EXTRA  STRENGTH) 125 MG CAPS Per box as needed for gas and bloating  . mometasone (NASONEX) 50 MCG/ACT nasal spray Place 1 spray into the nose 2 (two) times daily.   No facility-administered encounter medications on file as of 07/24/2017.      Review of Systems  Constitutional:   No  weight loss, night sweats,  Fevers, chills, fatigue, or  lassitude.  HEENT:   No headaches,  Difficulty swallowing,  Tooth/dental problems, or  Sore throat,                No sneezing, itching, ear ache, nasal congestion, post nasal drip,   CV:  No chest pain,  Orthopnea, PND, swelling in lower extremities, anasarca, dizziness, palpitations, syncope.   GI  No heartburn, indigestion, abdominal pain, nausea, vomiting, diarrhea, change in bowel habits, loss of appetite, bloody stools.   Resp: No shortness of breath with exertion or at rest.  No excess mucus, no productive cough,  No non-productive cough,  No coughing up of blood.  No change in color of mucus.  No wheezing.  No chest wall deformity  Skin: no rash or lesions.  GU: no dysuria, change in color of urine, no urgency or frequency.  No flank pain, no hematuria   MS:  No joint pain or swelling.  No decreased range of motion.  No back pain.    Physical Exam  BP 130/70 (BP Location: Right Arm, Cuff Size: Normal)   Pulse 68   Ht 5\' 8"  (1.727 m)   Wt 189 lb (85.7 kg)   SpO2 99%   BMI 28.74 kg/m   GEN: A/Ox3; pleasant , NAD    HEENT:  [place]/AT,  EACs-clear, TMs-wnl, NOSE-clear, THROAT-clear, no lesions, no postnasal drip or exudate noted.  Poor dentition   NECK:  Supple w/ fair ROM; no JVD; normal carotid impulses w/o bruits; no thyromegaly or nodules palpated; no lymphadenopathy.    RESP  Clear  P & A; w/o, wheezes/ rales/ or rhonchi. no accessory muscle use, no dullness to percussion  CARD:  RRR, no m/r/g, no peripheral edema, pulses intact, no cyanosis or clubbing.  GI:   Soft & nt; nml bowel sounds; no organomegaly or masses detected.   Musco:  Warm bil, no deformities or joint swelling noted.   Neuro: alert, no focal deficits noted.    Skin: Warm, no lesions or rashes    Lab Results:  CBC  BNP No results found for: BNP  ProBNP No results found for: PROBNP  Imaging: No results found.   Assessment & Plan:   Essential hypertension, benign Controlled on current regimen Patient's medications were reviewed today and patient education was given. Computerized medication calendar was adjusted/completed    Severe persistent asthma Controlled without flare  Plan  . Patient Instructions  Continue on current regimen .  Follow med calendar closely and bring to each visit.  Follow up Dr. Melvyn Novas in 3 months and As needed   Low fat cholesterol diet.  Low salt diet.      Hyperlipidemia Low fat cholesterol diet      Rexene Edison, NP 07/24/2017

## 2017-07-24 NOTE — Assessment & Plan Note (Signed)
Low fat cholesterol diet   

## 2017-07-24 NOTE — Assessment & Plan Note (Signed)
Controlled on current regimen Patient's medications were reviewed today and patient education was given. Computerized medication calendar was adjusted/completed

## 2017-07-24 NOTE — Patient Instructions (Signed)
Continue on current regimen .  Follow med calendar closely and bring to each visit.  Follow up Dr. Wert in 3 months and As needed   Low fat cholesterol diet.  Low salt diet.   

## 2017-07-24 NOTE — Progress Notes (Signed)
Chart and office note reviewed in detail  > agree with a/p as outlined    

## 2017-07-24 NOTE — Assessment & Plan Note (Signed)
Controlled without flare  Plan  . Patient Instructions  Continue on current regimen .  Follow med calendar closely and bring to each visit.  Follow up Dr. Melvyn Novas in 3 months and As needed   Low fat cholesterol diet.  Low salt diet.

## 2017-07-25 MED ORDER — MOMETASONE FURO-FORMOTEROL FUM 200-5 MCG/ACT IN AERO: 2.0000 | INHALATION_SPRAY | Freq: Two times a day (BID) | RESPIRATORY_TRACT | 0 refills | Status: DC

## 2017-07-25 NOTE — Addendum Note (Signed)
Addended by: Parke Poisson E on: 07/25/2017 05:16 PM   Modules accepted: Orders

## 2017-08-01 NOTE — Addendum Note (Signed)
Addended by: Parke Poisson E on: 08/01/2017 12:11 PM   Modules accepted: Orders

## 2017-10-24 ENCOUNTER — Encounter: Payer: Self-pay | Admitting: Internal Medicine

## 2017-10-24 ENCOUNTER — Ambulatory Visit (INDEPENDENT_AMBULATORY_CARE_PROVIDER_SITE_OTHER): Payer: Medicare Other | Admitting: Internal Medicine

## 2017-10-24 VITALS — BP 112/84 | HR 72 | Ht 68.0 in | Wt 190.4 lb

## 2017-10-24 DIAGNOSIS — J302 Other seasonal allergic rhinitis: Secondary | ICD-10-CM | POA: Diagnosis not present

## 2017-10-24 DIAGNOSIS — J3089 Other allergic rhinitis: Secondary | ICD-10-CM

## 2017-10-24 DIAGNOSIS — J4550 Severe persistent asthma, uncomplicated: Secondary | ICD-10-CM

## 2017-10-24 DIAGNOSIS — I10 Essential (primary) hypertension: Secondary | ICD-10-CM | POA: Diagnosis not present

## 2017-10-24 MED ORDER — BUDESONIDE-FORMOTEROL FUMARATE 160-4.5 MCG/ACT IN AERO: 2.0000 | INHALATION_SPRAY | Freq: Two times a day (BID) | RESPIRATORY_TRACT | 11 refills | Status: DC

## 2017-10-24 MED ORDER — BUDESONIDE-FORMOTEROL FUMARATE 160-4.5 MCG/ACT IN AERO: 2.0000 | INHALATION_SPRAY | Freq: Two times a day (BID) | RESPIRATORY_TRACT | 0 refills | Status: DC

## 2017-10-24 NOTE — Patient Instructions (Addendum)
Ok to leave off benicar at this point  Work on inhaler technique:  relax and gently blow all the way out then take a nice smooth deep breath back in, triggering the inhaler at same time you start breathing in.  Hold for up to 5 seconds if you can. Blow out thru nose. Rinse and gargle with water when done     Please schedule a follow up office visit in 4 weeks to see [name] on return - call  sooner if needed  with all medications /inhalers/ solutions in hand so we can verify exactly what you are taking. This includes all medications from all doctors and over the counters Add : what he really needs is a case manager to go over finances and insurance benefits to find out why he can't afford any of his meds as written on the plan he uses

## 2017-10-24 NOTE — Progress Notes (Signed)
Subjective:   Patient ID: Courtney Fenlon    DOB: 12-Apr-1941     Brief patient profile: 20  yobm denies ever smoking but has lifelong asthma with severe chronic pattern and best FEV1 of around 2.2 liters documented 2002 and also documented non-adherence  followed in pulmonary for primary care also for hbp and hyperlipidemia.   History of Present Illness    07/30/13 Allergy eval rx completed x 4 years > d/c'd and f/u allergy prn    04/03/2015 Follow up : chronic asthma/ HTN/non adherence / relies on samples/ no formulary / has med calendar  Pt returns for 1 month follow up .  Doing well with no asthma flare , on Dulera . Marland Kitchen  Need to establish with dentist > multiple teeth extracted   05/06/2016 NP Follow up ; Severe chronic asthma  Patient presents for 1 month follow up and med review We reviewed all his medications organize them into a medication count with patient education Appears he is taking medications correctly. However, does depend on samples. He did not have his Pepcid or Protonix today but says he has them at home. Says overall his breathing is doing well without any flare cough or wheezing. Seen by allergy Dr. Annamaria Boots on 04/23/16   , IgE 203, elevated eos Looking into Nucala.  Prevnar and PVX utd.  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 Is able to sing without sob.  rec No change rx   06/04/16 Dr Annamaria Boots rec Nucala > could not afford deductible      06/19/2017  f/u ov/Jasara Corrigan re: severe asthma/ no recent pred rx / allergic rhinitis no longer on shots/ hbp on samples of benicar Chief Complaint  Patient presents with  . Follow-up    Pt states that he is doing well and no co's today. He is using his proair 2 x per wk on average. He has not had to use his neb.   appears to be dep on samples though has insurance / very poor insight in how to use it / does not know if he has a formulary No ex cp/ tia or claudication  Not limited by breathing from desired activities  But  very sedentary / rare need for saba while on symb 160 2bid  rec Leave off the protonix and just take pepcid 20 mg at bfast and bedtime   Np ov 07/24/17 > new med calendar     10/24/2017  f/u ov/Hodge Stachnik re: severe chronic asthma on prn pred/ no med calendar  Chief Complaint  Patient presents with  . Follow-up    Pt c/o increased cough with yellow sputum for the past wk.  He is on a pred taper currently.   dulera is on 0 "has another one at home, just finished this am / apparently only prescription he ever purchases is pred and saba as we have samples of neither Uses alb at least twice daily when "overdoes it" No longer taking ARB  No obvious day to day or daytime variability or assoc excess/ purulent sputum or mucus plugs or hemoptysis or cp or chest tightness, subjective wheeze or overt sinus or hb symptoms. No unusual exp hx or h/o childhood pna/ asthma or knowledge of premature birth.  Sleeping ok flat without nocturnal  or early am exacerbation  of respiratory  c/o's or need for noct saba. Also denies any obvious fluctuation of symptoms with weather or environmental changes or other aggravating or alleviating factors except as  outlined above   Current Allergies, Complete Past Medical History, Past Surgical History, Family History, and Social History were reviewed in [employer] record.  ROS  The following are not active complaints unless bolded Hoarseness, sore throat, dysphagia, dental problems, itching, sneezing,  nasal congestion or discharge of excess mucus or purulent secretions, ear ache,   fever, chills, sweats, unintended wt loss or wt gain, classically pleuritic or exertional cp,  orthopnea pnd or leg swelling, presyncope, palpitations, abdominal pain, anorexia, nausea, vomiting, diarrhea  or change in bowel habits or change in bladder habits, change in stools or change in urine, dysuria, hematuria,  rash, arthralgias, visual complaints, headache, numbness,  weakness or ataxia or problems with walking or coordination,  change in mood/affect or memory.        Current Meds  Medication Sig  . albuterol (PROAIR HFA) 108 (90 Base) MCG/ACT inhaler 2 puffs every 4 hours as needed for wheezing ((PLANB))  . albuterol (PROVENTIL) (2.5 MG/3ML) 0.083% nebulizer solution USE 1 VIAL IN NEBULIZER EVERY 4 HOURS AS NEEDED  . aspirin 81 MG EC tablet Take 81 mg by mouth every morning.   . famotidine (PEPCID) 20 MG tablet Take 20 mg by mouth at bedtime.   . fluticasone (FLONASE) 50 MCG/ACT nasal spray Place 1 spray into both nostrils 2 (two) times daily.   Marland Kitchen guaifenesin (MUCUS RELIEF) 400 MG TABS tablet Take 2-3 tablets every 12 hours as needed for thick mucus/congestion and cough  . Naproxen Sodium (ALEVE) 220 MG CAPS Per bottle as needed for joint pain  . oxymetazoline (AFRIN) 0.05 % nasal spray 2 Puffs twice daliy x 5 days, off x 2 weeks for nasal stuffiness  . predniSONE (DELTASONE) 10 MG tablet 2 until better, 1 daily x 3 days, stop----Pt uses as needed for asthma flares.  Marland Kitchen Respiratory Therapy Supplies (FLUTTER) DEVI Use every 4 hours as needed for cough and congestion  . Simethicone (GAS-X EXTRA STRENGTH) 125 MG CAPS Per box as needed for gas and bloating  .   mometasone-formoterol (DULERA) 200-5 MCG/ACT AERO Inhale 2 puffs into the lungs 2 (two) times daily.  . [DISCONTINUED] olmesartan (BENICAR) 40 MG tablet Take 1/2 tablet by mouth every morning                   Past Medical Hx ASTHMA (ICD-493.90)  --chronic severe asthma with best FEV1 2.2 L documented 01/2001 and  documented non-adherence Mainly lives off samples of inhalers and nose sprays  - HFA 75% June 15, 2009 > 90% August 02, 2009 > 100% December 27, 2009  - Off allergy vaccines x7/2010 > highly allergic skin tests per Dr Annamaria Boots > restarted vaccine November 06, 2009 > stopped  07/2013  SHOULDER PAIN, RIGHT (ICD-719.41)   HYPERTENSION, BORDERLINE (ICD-401.9)  Shingles T 8 Left 9/01  HEALTH  MAINTENANCE...............................................................................................Marland KitchenWert  - Colonoscopy 12/1989,  2011  - dT 07/25/2014  - Pneumovax 3/04 and 05/2009 - Prevnar 07/25/14 -Flu declined October 17, 2008 and September 21, 2009 , declined flu shot 2015, 2017  -CPX 07/25/2014  - Meds reviewed with pt education and computerized med calendar adjusted last  01/08/2016 , 05/06/2016 , 10/07/2016            Objective:   Physical Exam        amb bm/ nad    05/23/2014  201> 201 07/04/2014 > 07/25/14  196>198 08/22/2014 >194 09/19/2014 >198 10/11/2014 > 10/24/2014  196>   01/02/2015 193 > 03/06/2015  198 >198  04/03/2015 > 05/16/2015 196 >  06/02/2015 196 >197 06/16/2015 > 12/12/2015 199> 197 01/08/2016 >   02/16/2016     194 > 04/08/2016  193> 08/06/2016    196 > 12/31/2016  191 > 01/28/2017  191  > 06/19/2017  192 > 10/24/2017   190    Vital signs reviewed - Note on arrival 02 sats  97% on RA       HEENT: nl dentition,  and oropharynx. Nl external ear canals without cough reflex, poor dentition - mild to moderate bilateral turbinate non-specific edema pattern   NECK :  without JVD/Nodes/TM/ nl carotid upstrokes bilaterally   LUNGS: no acc muscle use, hyperresonant to percussion,  Distant  Mid to late  exp wheeze bilaterally    CV:  RRR  no s3 or murmur or increase in P2, no edema   ABD:  soft and nontender with nl excursion in the supine position. No bruits or organomegaly, bowel sounds nl  MS:  Nl gait, warm without deformities, nl gait, no calf tenderness, cyanosis or clubbing - neg homan's   Neuro:  Child like affect (baseline) approp otherwise with nl sensorium, no motor def or pathologic reflexes

## 2017-10-26 ENCOUNTER — Encounter: Payer: Self-pay | Admitting: Internal Medicine

## 2017-10-26 NOTE — Assessment & Plan Note (Signed)
Off allergy vaccines x7/2010 > highly allergic skin tests per Dr Annamaria Boots > restarted vaccine November 06, 2009  Allergy vaccine 1:50:000 10/30/09; adv to 1:10 11/20/10 GH. D/Cd 07/30/13 - allergy profile 12/12/15 >  Eos 0.3 /   IgE  243 multiple pos RAST with dog the worst  - 07/2016 approved for nucala  But could not afford deductible   F/u with Dr Annamaria Boots prn

## 2017-10-26 NOTE — Assessment & Plan Note (Signed)
--  chronic severe asthma with best FEV1 2.2 L documented 01/2001 and documented non-adherence Mainly lives off samples of inhalers and nose sprays  - Off allergy vaccines x7/2010 > highly allergic skin tests per Dr Annamaria Boots > restart vaccine November 06, 2009>stop 07/30/13 to watch - Spirometry on "good day" 01/14/12 =  FEV1 1.54  Ratio 0.58  - 02/16/2016 added prn prednisone when needing neb - FENO 04/23/16- 32  -CBC with differential 04/23/2016-eosinophils elevated 7.090%  - Spirometry 01/28/2017  FEV1 1.23 (49%)  Ratio 60     - 10/24/2017  After extensive coaching HFA effectiveness =    75% from a baseline of  50   DDX of  difficult airways management almost all start with A and  include Adherence, Ace Inhibitors, Acid Reflux, Active Sinus Disease, Alpha 1 Antitripsin deficiency, Anxiety masquerading as Airways dz,  ABPA,  Allergy(esp in young), Aspiration (esp in elderly), Adverse effects of meds,  Active smokers, A bunch of PE's (a small clot burden can't cause this syndrome unless there is already severe underlying pulm or vascular dz with poor reserve) plus two Bs  = Bronchiectasis and Beta blocker use..and one C= CHF  Adherence is always the initial "prime suspect" and is a multilayered concern that requires a "trust but verify" approach in every patient - starting with knowing how to use medications, especially inhalers, correctly, keeping up with refills and understanding the fundamental difference between maintenance and prns vs those medications only taken for a very short course and then stopped and not refilled.  - see hfa teaching - not clear that he can't afford any rx on med part D or what costs are after meeting deductibles but it's not a good sign that he is so dep on samples > ov q 4 weeks, may need an in home assessment by case manager   Allergies >  Could not afford nucala, f/u with Dr Annamaria Boots prn   ? gerd > just on pepcid now    I had an extended discussion with the patient reviewing  all relevant studies completed to date and  lasting 15 to 20 minutes of a 25 minute visit    Each maintenance medication was reviewed in detail including most importantly the difference between maintenance and prns and under what circumstances the prns are to be triggered using an action plan format that is not reflected in the computer generated alphabetically organized AVS but trather by a customized med calendar that reflects the AVS meds with confirmed 100% correlation.   In addition, Please see AVS for unique instructions that I personally wrote and verbalized to the the pt in detail and then reviewed with pt  by my nurse highlighting any  changes in therapy recommended at today's visit to their plan of care.

## 2017-10-26 NOTE — Assessment & Plan Note (Signed)
B/p elevated at ov at 160/84 09/05/2011 >>rx benicar 40mg  1/2 daily along w/ lifesytle changes  - 10/24/2017 informed that no longer taking any meds/ "can't afford"> bp ok off rx

## 2017-11-21 ENCOUNTER — Ambulatory Visit (INDEPENDENT_AMBULATORY_CARE_PROVIDER_SITE_OTHER): Payer: Medicare Other | Admitting: Adult Health

## 2017-11-21 ENCOUNTER — Encounter: Payer: Self-pay | Admitting: Adult Health

## 2017-11-21 DIAGNOSIS — I10 Essential (primary) hypertension: Secondary | ICD-10-CM

## 2017-11-21 DIAGNOSIS — J302 Other seasonal allergic rhinitis: Secondary | ICD-10-CM

## 2017-11-21 DIAGNOSIS — E782 Mixed hyperlipidemia: Secondary | ICD-10-CM

## 2017-11-21 DIAGNOSIS — J3089 Other allergic rhinitis: Secondary | ICD-10-CM | POA: Diagnosis not present

## 2017-11-21 DIAGNOSIS — J4550 Severe persistent asthma, uncomplicated: Secondary | ICD-10-CM

## 2017-11-21 MED ORDER — ALBUTEROL SULFATE HFA 108 (90 BASE) MCG/ACT IN AERS: INHALATION_SPRAY | RESPIRATORY_TRACT | 5 refills | Status: DC

## 2017-11-21 MED ORDER — BUDESONIDE-FORMOTEROL FUMARATE 160-4.5 MCG/ACT IN AERO: 2.0000 | INHALATION_SPRAY | Freq: Two times a day (BID) | RESPIRATORY_TRACT | 0 refills | Status: DC

## 2017-11-21 MED ORDER — ALBUTEROL SULFATE (2.5 MG/3ML) 0.083% IN NEBU: INHALATION_SOLUTION | RESPIRATORY_TRACT | 3 refills | Status: DC

## 2017-11-21 NOTE — Assessment & Plan Note (Signed)
Controlled  Patient's medications were reviewed today and patient education was given. Computerized medication calendar was adjusted/completed  Pt assistance for symbicort   Plan  Patient Instructions  Continue on current regimen .  Follow med calendar closely and bring to each visit.  Follow up Dr. Melvyn Novas in 3 months and As needed   Low fat cholesterol diet.  Low salt diet.

## 2017-11-21 NOTE — Assessment & Plan Note (Signed)
Controlled   Plan  Patient Instructions  Continue on current regimen .  Follow med calendar closely and bring to each visit.  Follow up Dr. Melvyn Novas in 3 months and As needed   Low fat cholesterol diet.  Low salt diet.

## 2017-11-21 NOTE — Addendum Note (Signed)
Addended by: Parke Poisson E on: 11/21/2017 03:19 PM   Modules accepted: Orders

## 2017-11-21 NOTE — Progress Notes (Signed)
@Patient  ID: [name], male    DOB: November 05, 1941, 77 y.o.   MRN: [id number]  Chief Complaint  Patient presents with  . Follow-up    Asthma     Referring provider: Tanda Rockers, MD  HPI: 36 yobm denies ever smoking but has lifelong asthma with severe chronic pattern and best FEV1 of around 2.2 liters documented 2002 followed in pulmonary for primary care also for hbp and hyperlipidemia. -Sings gospel music, solo/group   TEST  --chronic severe asthma with best FEV1 2.2 L documented 01/2001 and documented non-adherence Mainly lives off samples of inhalers and nose sprays - Off allergy vaccines x7/2010 >highly allergic skin tests per Dr Annamaria Boots >restart vaccine November 06, 2009>stop 07/30/13 to watch - Spirometry on "good day" 01/14/12 = FEV1 1.54 Ratio 0.58  - 2/24/2017added prn prednisone when needing neb - FENO 04/23/16- 32  -CBC with differential 04/23/2016-eosinophils elevated 7.090%  - Spirometry 2/6/2018FEV1 1.23 (49%) Ratio 60   11/21/2017 Follow up ; Asthma /HTN  Pt returns for 1 month follow up . He has known chronic asthma. No flare of cough or wheezing .  Remains on Symbicort . No increased SABA use.   He has HTN . On Benicar in past but has been unable to afford so depended on samples which we have not had in long time. B/p is under control off meds. Advised on low salt diet.   Flu shot declined.   Reviewed meds and Calendar updated. Pt education given. Is taking correctly . He has limited funds on fixed income . Hard to afford copays. We discussed pat assistance paperwork for Symbicort.      No Known Allergies  Immunization History  Administered Date(s) Administered  . Pneumococcal Conjugate-13 07/25/2014  . Pneumococcal Polysaccharide-23 12/23/2008  . Tdap 07/25/2014    Past Medical History:  Diagnosis Date  . Acid reflux   . Asthma 06/18/2011  . Borderline hypertension   . Cataract   . Colon polyps 2011   1 Hyperplastic and 1 Tubular  Adenoma   . Dysphagia   . Esophageal stricture   . GERD (gastroesophageal reflux disease)   . Hx of adenomatous colonic polyps 06/25/2015  . Personal history of colonic polyps 12/1989  . Shingles   . Shoulder pain, right   . Unspecified asthma(493.90)   . Urinary frequency     Tobacco History: Social History   Tobacco Use  Smoking Status Never Smoker  Smokeless Tobacco Never Used   Counseling given: Not Answered   Outpatient Encounter Medications as of 11/21/2017  Medication Sig  . albuterol (PROAIR HFA) 108 (90 Base) MCG/ACT inhaler 2 puffs every 4 hours as needed for wheezing ((PLANB))  . albuterol (PROVENTIL) (2.5 MG/3ML) 0.083% nebulizer solution USE 1 VIAL IN NEBULIZER EVERY 4 HOURS AS NEEDED  . aspirin 81 MG EC tablet Take 81 mg by mouth every morning.   . budesonide-formoterol (SYMBICORT) 160-4.5 MCG/ACT inhaler Inhale 2 puffs into the lungs 2 (two) times daily.  . famotidine (PEPCID) 20 MG tablet Take 20 mg by mouth at bedtime.   . fluticasone (FLONASE) 50 MCG/ACT nasal spray Place 1 spray into both nostrils 2 (two) times daily.   Marland Kitchen guaifenesin (MUCUS RELIEF) 400 MG TABS tablet Take 2-3 tablets every 12 hours as needed for thick mucus/congestion and cough  . Naproxen Sodium (ALEVE) 220 MG CAPS Per bottle as needed for joint pain  . oxymetazoline (AFRIN) 0.05 % nasal spray 2 Puffs twice daliy x 5 days, off x 2  weeks for nasal stuffiness  . predniSONE (DELTASONE) 10 MG tablet 2 until better, 1 daily x 3 days, stop----Pt uses as needed for asthma flares.  Marland Kitchen Respiratory Therapy Supplies (FLUTTER) DEVI Use every 4 hours as needed for cough and congestion  . Simethicone (GAS-X EXTRA STRENGTH) 125 MG CAPS Per box as needed for gas and bloating  . mometasone (NASONEX) 50 MCG/ACT nasal spray Place 1 spray into the nose 2 (two) times daily. (Patient not taking: Reported on 10/24/2017)   No facility-administered encounter medications on file as of 11/21/2017.      Review of  Systems  Constitutional:   No  weight loss, night sweats,  Fevers, chills, fatigue, or  lassitude.  HEENT:   No headaches,  Difficulty swallowing,  Tooth/dental problems, or  Sore throat,                No sneezing, itching, ear ache, nasal congestion, post nasal drip,   CV:  No chest pain,  Orthopnea, PND, swelling in lower extremities, anasarca, dizziness, palpitations, syncope.   GI  No heartburn, indigestion, abdominal pain, nausea, vomiting, diarrhea, change in bowel habits, loss of appetite, bloody stools.   Resp: No shortness of breath with exertion or at rest.  No excess mucus, no productive cough,  No non-productive cough,  No coughing up of blood.  No change in color of mucus.  No wheezing.  No chest wall deformity  Skin: no rash or lesions.  GU: no dysuria, change in color of urine, no urgency or frequency.  No flank pain, no hematuria   MS:  No joint pain or swelling.  No decreased range of motion.  No back pain.    Physical Exam  BP 106/66 (BP Location: Left Arm, Cuff Size: Normal)   Pulse (!) 59   Ht 5\' 8"  (1.727 m)   Wt 189 lb 3.2 oz (85.8 kg)   SpO2 96%   BMI 28.77 kg/m   GEN: A/Ox3; pleasant , NAD, well nourished    HEENT:  [place]/AT,  EACs-clear, TMs-wnl, NOSE-clear, THROAT-clear, no lesions, no postnasal drip or exudate noted. Poor dentition   NECK:  Supple w/ fair ROM; no JVD; normal carotid impulses w/o bruits; no thyromegaly or nodules palpated; no lymphadenopathy.    RESP  Clear  P & A; w/o, wheezes/ rales/ or rhonchi. no accessory muscle use, no dullness to percussion  CARD:  RRR, no m/r/g, no peripheral edema, pulses intact, no cyanosis or clubbing.  GI:   Soft & nt; nml bowel sounds; no organomegaly or masses detected.   Musco: Warm bil, no deformities or joint swelling noted.   Neuro: alert, no focal deficits noted.    Skin: Warm, no lesions or rashes    Lab Results:  CBC  No results found for: BNP  ProBNP No results found for:  PROBNP  Imaging: No results found.   Assessment & Plan:   Seasonal and perennial allergic rhinitis Controlled   Plan  Patient Instructions  Continue on current regimen .  Follow med calendar closely and bring to each visit.  Follow up Dr. Melvyn Novas in 3 months and As needed   Low fat cholesterol diet.  Low salt diet.      Severe persistent asthma Controlled  Patient's medications were reviewed today and patient education was given. Computerized medication calendar was adjusted/completed  Pt assistance for symbicort   Plan  Patient Instructions  Continue on current regimen .  Follow med calendar closely and bring to each  visit.  Follow up Dr. Melvyn Novas in 3 months and As needed   Low fat cholesterol diet.  Low salt diet.      Hyperlipidemia Low cholestrol diet     Essential hypertension, benign Controlled off rx  Low salt diet       Rexene Edison, NP 11/21/2017

## 2017-11-21 NOTE — Progress Notes (Signed)
Chart and office note reviewed in detail  > agree with a/p as outlined    

## 2017-11-21 NOTE — Assessment & Plan Note (Addendum)
Low cholestrol diet

## 2017-11-21 NOTE — Patient Instructions (Signed)
Continue on current regimen .  Follow med calendar closely and bring to each visit.  Follow up Dr. Melvyn Novas in 3 months and As needed   Low fat cholesterol diet.  Low salt diet.

## 2017-11-21 NOTE — Assessment & Plan Note (Signed)
Controlled off rx  Low salt diet

## 2017-12-19 DIAGNOSIS — L723 Sebaceous cyst: Secondary | ICD-10-CM | POA: Diagnosis not present

## 2018-02-19 ENCOUNTER — Encounter: Payer: Self-pay | Admitting: Internal Medicine

## 2018-02-19 ENCOUNTER — Ambulatory Visit (INDEPENDENT_AMBULATORY_CARE_PROVIDER_SITE_OTHER): Payer: Medicare Other | Admitting: Internal Medicine

## 2018-02-19 VITALS — BP 122/84 | HR 82 | Ht 68.0 in | Wt 192.0 lb

## 2018-02-19 DIAGNOSIS — J4550 Severe persistent asthma, uncomplicated: Secondary | ICD-10-CM

## 2018-02-19 MED ORDER — BUDESONIDE-FORMOTEROL FUMARATE 160-4.5 MCG/ACT IN AERO: 2.0000 | INHALATION_SPRAY | Freq: Two times a day (BID) | RESPIRATORY_TRACT | 0 refills | Status: DC

## 2018-02-19 NOTE — Assessment & Plan Note (Signed)
--  chronic severe asthma with best FEV1 2.2 L documented 01/2001 and documented non-adherence Mainly lives off samples of inhalers and nose sprays  - Off allergy vaccines x7/2010 > highly allergic skin tests per Dr Annamaria Boots > restart vaccine November 06, 2009>stop 07/30/13 to watch - Spirometry on "good day" 01/14/12 =  FEV1 1.54  Ratio 0.58  - 02/16/2016 added prn prednisone when needing neb - FENO 04/23/16- 32  -CBC with differential 04/23/2016-eosinophils elevated 7.090%  - Spirometry 01/28/2017  FEV1 1.23 (49%)  Ratio 60     - 02/19/2018  After extensive coaching inhaler device  effectiveness =    75% on 3rd try    DDX of  difficult airways management almost all start with A and  include Adherence, Ace Inhibitors, Acid Reflux, Active Sinus Disease, Alpha 1 Antitripsin deficiency, Anxiety masquerading as Airways dz,  ABPA,  Allergy(esp in young), Aspiration (esp in elderly), Adverse effects of meds,  Active smokers, A bunch of PE's (a small clot burden can't cause this syndrome unless there is already severe underlying pulm or vascular dz with poor reserve) plus two Bs  = Bronchiectasis and Beta blocker use..and one C= CHF  In this case Adherence is the biggest issue and starts with  inability to use HFA effectively and also  understand that SABA treats the symptoms but doesn't get to the underlying problem (inflammation).  I used  the analogy of putting steroid cream on a rash to help explain the meaning of topical therapy and the need to get the drug to the target tissue.   - see hfa teaching - financial issues addressed/ needs samples to get by > 4 weeks symb 160 provided and ov in 4 weeks  ? Acid (or non-acid) GERD > always difficult to exclude as up to 75% of pts in some series report no assoc GI/ Heartburn symptoms and his cough is worse off pepcid esp p supper so reviewed again that pepcid is not prn/ needs to use bid plus diet  ? Allergy component > max ics/ pred as plan D(D for deltasone) reviewed      I had an extended discussion with the patient reviewing all relevant studies completed to date and  lasting 25 minutes of a 40  minute extended office  visit addressing  Refractory/ severe non-specific but potentially very serious   respiratory symptoms of uncertain and potentially multiple  Etiologies.    If reviewed asthma M and M with him and insisted he return with all meds in hand using a trust but verify approach to confirm accurate Medication  Reconciliation The principal here is that until we are certain that the  patients are doing what we've asked, it makes no sense to ask them to do more.   In his case should divide maint vs prns as they are listed on med calendar below:    Each maintenance medication was reviewed in detail including most importantly the difference between maintenance and as needed and under what circumstances the prns are to be used. This was done in the context of a medication calendar review which provided the patient with a user-friendly unambiguous mechanism for medication administration and reconciliation and provides an action plan for all active problems. It is critical that this be shown to every doctor  for modification during the office visit if necessary so the patient can use it as a working document.      Marland Kitchen

## 2018-02-19 NOTE — Patient Instructions (Signed)
Change the pepcid to where you take 20 mg one after bfast and one after supper   See calendar for specific medication instructions and bring it back for each and every office visit for every healthcare provider you see.  Without it,  you may not receive the best quality medical care that we feel you deserve.  You will note that the calendar groups together  your maintenance  medications that are timed at particular times of the day.  Think of this as your checklist for what your doctor has instructed you to do until your next evaluation to see what benefit  there is  to staying on a consistent group of medications intended to keep you well.  The other group at the bottom is entirely up to you to use as you see fit  for specific symptoms that may arise between visits that require you to treat them on an as needed basis.  Think of this as your action plan or "what if" list.   Separating the top medications from the bottom group is fundamental to providing you adequate care going forward.    Please schedule a follow up office visit in 4 weeks to See Tammy , sooner if needed  with all medications /inhalers/ solutions in hand so we can verify exactly what you are taking. This includes all medications from all doctors and over the counters  - two bags as separated on your med calendar

## 2018-02-19 NOTE — Progress Notes (Signed)
Subjective:   Patient ID: [name]    DOB: 1941-01-08     Brief patient profile: 3  yobm denies ever smoking but has lifelong asthma with severe chronic pattern and best FEV1 of around 2.2 liters documented 2002 and also documented non-adherence  followed in pulmonary for primary care also for hbp and hyperlipidemia.   History of Present Illness    07/30/13 Allergy eval rx completed x 4 years > d/c'd and f/u allergy prn    04/03/2015 Follow up : chronic asthma/ HTN/non adherence / relies on samples/ no formulary / has med calendar  Pt returns for 1 month follow up .  Doing well with no asthma flare , on Dulera . Marland Kitchen  Need to establish with dentist > multiple teeth extracted   05/06/2016 NP Follow up ; Severe chronic asthma  Patient presents for 1 month follow up and med review We reviewed all his medications organize them into a medication count with patient education Appears he is taking medications correctly. However, does depend on samples. He did not have his Pepcid or Protonix today but says he has them at home. Says overall his breathing is doing well without any flare cough or wheezing. Seen by allergy Dr. Annamaria Boots on 04/23/16   , IgE 203, elevated eos Looking into Nucala.  Prevnar and PVX utd.  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 Is able to sing without sob.  rec No change rx   06/04/16 Dr Annamaria Boots rec Nucala > could not afford deductible         10/24/2017  f/u ov/[name] re: severe chronic asthma on prn pred/ no med calendar  Chief Complaint  Patient presents with  . Follow-up    Pt c/o increased cough with yellow sputum for the past wk.  He is on a pred taper currently.   dulera is on 0 "has another one at home, just finished this am / apparently only prescription he ever purchases is pred and saba as we have samples of neither Uses alb at least twice daily when "overdoes it" No longer taking ARB rec Ok to leave off benicar at this point Work on inhaler  technique:       02/19/2018  f/u ov/[name] re:  Chronic severe asthma / add libbing on meds (pepcid) and not following med cal  Chief Complaint  Patient presents with  . Follow-up    c/o increased cough for the past several days- prod with minimal clear sputum.  He has not used proair but has used his neb x 2 in the past wk.    Dyspnea:  Worse x sev weeks Cough: clear sputum/ worse x sev days afters after supper  Sleep: on side/ horizontal s noct cough/ wheeze SABA use:  Tends to use more neb as cheaper    No obvious day to day or daytime variability or assoc excess/ purulent sputum or mucus plugs or hemoptysis or cp or chest tightness, subjective wheeze or overt sinus or hb symptoms. No unusual exposure hx or h/o childhood pna/ asthma or knowledge of premature birth.  Sleeping ok on side  without nocturnal  or early am exacerbation  of respiratory  c/o's or need for noct saba. Also denies any obvious fluctuation of symptoms with weather or environmental changes or other aggravating or alleviating factors except as outlined above   Current Allergies, Complete Past Medical History, Past Surgical History, Family History, and Social History were reviewed in [employer]  medical record.  ROS  The following are not active complaints unless bolded Hoarseness, sore throat, dysphagia, dental problems, itching, sneezing,  nasal congestion or discharge of excess mucus or purulent secretions, ear ache,   fever, chills, sweats, unintended wt loss or wt gain, classically pleuritic or exertional cp,  orthopnea pnd or leg swelling, presyncope, palpitations, abdominal pain, anorexia, nausea, vomiting, diarrhea  or change in bowel habits or change in bladder habits, change in stools or change in urine, dysuria, hematuria,  rash, arthralgias, visual complaints, headache, numbness, weakness or ataxia or problems with walking or coordination,  change in mood/affect or memory.        Current Meds   Medication Sig  . albuterol (PROAIR HFA) 108 (90 Base) MCG/ACT inhaler 2 puffs every 4 hours as needed for wheezing ((PLANB))  . albuterol (PROVENTIL) (2.5 MG/3ML) 0.083% nebulizer solution USE 1 VIAL IN NEBULIZER EVERY 4 HOURS AS NEEDED  . aspirin 81 MG EC tablet Take 81 mg by mouth every morning.   . famotidine (PEPCID) 20 MG tablet Take 20 mg by mouth daily as needed.   Marland Kitchen guaifenesin (MUCUS RELIEF) 400 MG TABS tablet Take 2-3 tablets every 12 hours as needed for thick mucus/congestion and cough  . mometasone (NASONEX) 50 MCG/ACT nasal spray Place 1 spray into the nose 2 (two) times daily.  . mometasone-formoterol (DULERA) 200-5 MCG/ACT AERO Inhale 2 puffs into the lungs 2 (two) times daily.  . Naproxen Sodium (ALEVE) 220 MG CAPS Per bottle as needed for joint pain  . oxymetazoline (AFRIN) 0.05 % nasal spray 2 Puffs twice daliy x 5 days, off x 2 weeks for nasal stuffiness  . predniSONE (DELTASONE) 10 MG tablet 2 until better, 1 daily x 3 days, stop----Pt uses as needed for asthma flares.  Marland Kitchen Respiratory Therapy Supplies (FLUTTER) DEVI Use every 4 hours as needed for cough and congestion  . Simethicone (GAS-X EXTRA STRENGTH) 125 MG CAPS Per box as needed for gas and bloating              Past Medical Hx ASTHMA (ICD-493.90)  --chronic severe asthma with best FEV1 2.2 L documented 01/2001 and  documented non-adherence Mainly lives off samples of inhalers and nose sprays  - HFA 75% June 15, 2009 > 90% August 02, 2009 > 100% December 27, 2009  - Off allergy vaccines x7/2010 > highly allergic skin tests per Dr Annamaria Boots > restarted vaccine November 06, 2009 > stopped  07/2013  SHOULDER PAIN, RIGHT (ICD-719.41)   HYPERTENSION, BORDERLINE (ICD-401.9)  Shingles T 8 Left 9/01  HEALTH MAINTENANCE...............................................................................................Marland KitchenWert  - Colonoscopy 12/1989,  2011  - dT 07/25/2014  - Pneumovax 3/04 and 05/2009 - Prevnar 07/25/14 -Flu declined  October 17, 2008 and September 21, 2009 , declined flu shot 2015, 2017  -CPX 07/25/2014              Objective:   Physical Exam       amb bm nad   05/23/2014  201> 201 07/04/2014 > 07/25/14  196>198 08/22/2014 >194 09/19/2014 >198 10/11/2014 > 10/24/2014  196>   01/02/2015 193 > 03/06/2015  198 >198 04/03/2015 > 05/16/2015 196 >  06/02/2015 196 >197 06/16/2015 > 12/12/2015 199> 197 01/08/2016 >   02/16/2016     194 > 04/08/2016  193> 08/06/2016    196 > 12/31/2016  191 > 01/28/2017  191  > 06/19/2017  192 > 10/24/2017   190 > 02/19/2018  192    Vital signs reviewed - Note on arrival  02 sats  97% on RA       HEENT: nl    turbinates bilaterally, and oropharynx. Nl external ear canals without cough reflex - very poor dentition with sev badly decayed molars    NECK :  without JVD/Nodes/TM/ nl carotid upstrokes bilaterally   LUNGS: no acc muscle use, slt barrel contour chest with distant bs and mod hyper resonant to percussion bilaterally without cough on exp   CV:  RRR  no s3 or murmur or increase in P2, and no edema   ABD:  soft and nontender with nl inspiratory excursion in the supine position. No bruits or organomegaly appreciated, bowel sounds nl  MS:  Nl gait/ ext warm without deformities, calf tenderness, cyanosis or clubbing No obvious joint restrictions   SKIN: warm and dry without lesions    NEURO:  alert, approp, nl sensorium with  no motor or cerebellar deficits apparent.

## 2018-02-25 ENCOUNTER — Other Ambulatory Visit: Payer: Self-pay | Admitting: Internal Medicine

## 2018-03-09 DIAGNOSIS — M9903 Segmental and somatic dysfunction of lumbar region: Secondary | ICD-10-CM | POA: Diagnosis not present

## 2018-03-09 DIAGNOSIS — M9901 Segmental and somatic dysfunction of cervical region: Secondary | ICD-10-CM | POA: Diagnosis not present

## 2018-03-09 DIAGNOSIS — M9902 Segmental and somatic dysfunction of thoracic region: Secondary | ICD-10-CM | POA: Diagnosis not present

## 2018-03-09 DIAGNOSIS — M791 Myalgia, unspecified site: Secondary | ICD-10-CM | POA: Diagnosis not present

## 2018-03-10 DIAGNOSIS — M9903 Segmental and somatic dysfunction of lumbar region: Secondary | ICD-10-CM | POA: Diagnosis not present

## 2018-03-10 DIAGNOSIS — M9901 Segmental and somatic dysfunction of cervical region: Secondary | ICD-10-CM | POA: Diagnosis not present

## 2018-03-10 DIAGNOSIS — M545 Low back pain: Secondary | ICD-10-CM | POA: Diagnosis not present

## 2018-03-10 DIAGNOSIS — M9902 Segmental and somatic dysfunction of thoracic region: Secondary | ICD-10-CM | POA: Diagnosis not present

## 2018-03-11 DIAGNOSIS — M791 Myalgia, unspecified site: Secondary | ICD-10-CM | POA: Diagnosis not present

## 2018-03-11 DIAGNOSIS — M9901 Segmental and somatic dysfunction of cervical region: Secondary | ICD-10-CM | POA: Diagnosis not present

## 2018-03-11 DIAGNOSIS — M9903 Segmental and somatic dysfunction of lumbar region: Secondary | ICD-10-CM | POA: Diagnosis not present

## 2018-03-11 DIAGNOSIS — M9902 Segmental and somatic dysfunction of thoracic region: Secondary | ICD-10-CM | POA: Diagnosis not present

## 2018-03-12 DIAGNOSIS — M9902 Segmental and somatic dysfunction of thoracic region: Secondary | ICD-10-CM | POA: Diagnosis not present

## 2018-03-12 DIAGNOSIS — M9903 Segmental and somatic dysfunction of lumbar region: Secondary | ICD-10-CM | POA: Diagnosis not present

## 2018-03-12 DIAGNOSIS — M9901 Segmental and somatic dysfunction of cervical region: Secondary | ICD-10-CM | POA: Diagnosis not present

## 2018-03-12 DIAGNOSIS — M791 Myalgia, unspecified site: Secondary | ICD-10-CM | POA: Diagnosis not present

## 2018-03-13 DIAGNOSIS — M9901 Segmental and somatic dysfunction of cervical region: Secondary | ICD-10-CM | POA: Diagnosis not present

## 2018-03-13 DIAGNOSIS — M9903 Segmental and somatic dysfunction of lumbar region: Secondary | ICD-10-CM | POA: Diagnosis not present

## 2018-03-13 DIAGNOSIS — M9902 Segmental and somatic dysfunction of thoracic region: Secondary | ICD-10-CM | POA: Diagnosis not present

## 2018-03-13 DIAGNOSIS — M791 Myalgia, unspecified site: Secondary | ICD-10-CM | POA: Diagnosis not present

## 2018-03-16 DIAGNOSIS — M791 Myalgia, unspecified site: Secondary | ICD-10-CM | POA: Diagnosis not present

## 2018-03-16 DIAGNOSIS — M9903 Segmental and somatic dysfunction of lumbar region: Secondary | ICD-10-CM | POA: Diagnosis not present

## 2018-03-16 DIAGNOSIS — M9901 Segmental and somatic dysfunction of cervical region: Secondary | ICD-10-CM | POA: Diagnosis not present

## 2018-03-16 DIAGNOSIS — M9902 Segmental and somatic dysfunction of thoracic region: Secondary | ICD-10-CM | POA: Diagnosis not present

## 2018-03-17 DIAGNOSIS — M9902 Segmental and somatic dysfunction of thoracic region: Secondary | ICD-10-CM | POA: Diagnosis not present

## 2018-03-17 DIAGNOSIS — M791 Myalgia, unspecified site: Secondary | ICD-10-CM | POA: Diagnosis not present

## 2018-03-17 DIAGNOSIS — M9901 Segmental and somatic dysfunction of cervical region: Secondary | ICD-10-CM | POA: Diagnosis not present

## 2018-03-17 DIAGNOSIS — M9903 Segmental and somatic dysfunction of lumbar region: Secondary | ICD-10-CM | POA: Diagnosis not present

## 2018-03-18 DIAGNOSIS — M9902 Segmental and somatic dysfunction of thoracic region: Secondary | ICD-10-CM | POA: Diagnosis not present

## 2018-03-18 DIAGNOSIS — M9903 Segmental and somatic dysfunction of lumbar region: Secondary | ICD-10-CM | POA: Diagnosis not present

## 2018-03-18 DIAGNOSIS — M9901 Segmental and somatic dysfunction of cervical region: Secondary | ICD-10-CM | POA: Diagnosis not present

## 2018-03-18 DIAGNOSIS — M791 Myalgia, unspecified site: Secondary | ICD-10-CM | POA: Diagnosis not present

## 2018-03-23 ENCOUNTER — Encounter: Payer: Medicare Other | Admitting: Internal Medicine

## 2018-03-24 ENCOUNTER — Encounter: Payer: Self-pay | Admitting: Adult Health

## 2018-03-24 ENCOUNTER — Ambulatory Visit (INDEPENDENT_AMBULATORY_CARE_PROVIDER_SITE_OTHER): Payer: Medicare Other | Admitting: Adult Health

## 2018-03-24 DIAGNOSIS — J302 Other seasonal allergic rhinitis: Secondary | ICD-10-CM

## 2018-03-24 DIAGNOSIS — M9902 Segmental and somatic dysfunction of thoracic region: Secondary | ICD-10-CM | POA: Diagnosis not present

## 2018-03-24 DIAGNOSIS — M9901 Segmental and somatic dysfunction of cervical region: Secondary | ICD-10-CM | POA: Diagnosis not present

## 2018-03-24 DIAGNOSIS — J3089 Other allergic rhinitis: Secondary | ICD-10-CM | POA: Diagnosis not present

## 2018-03-24 DIAGNOSIS — M791 Myalgia, unspecified site: Secondary | ICD-10-CM | POA: Diagnosis not present

## 2018-03-24 DIAGNOSIS — I10 Essential (primary) hypertension: Secondary | ICD-10-CM | POA: Diagnosis not present

## 2018-03-24 DIAGNOSIS — J4550 Severe persistent asthma, uncomplicated: Secondary | ICD-10-CM

## 2018-03-24 DIAGNOSIS — M9903 Segmental and somatic dysfunction of lumbar region: Secondary | ICD-10-CM | POA: Diagnosis not present

## 2018-03-24 MED ORDER — BUDESONIDE-FORMOTEROL FUMARATE 160-4.5 MCG/ACT IN AERO: 2.0000 | INHALATION_SPRAY | Freq: Two times a day (BID) | RESPIRATORY_TRACT | 0 refills | Status: DC

## 2018-03-24 NOTE — Assessment & Plan Note (Signed)
Controlled well on diet and lifestyle modifications continue on low-salt diet along with exercise.

## 2018-03-24 NOTE — Progress Notes (Signed)
@Patient  ID: [name], male    DOB: May 12, 1941, 77 y.o.   MRN: [id number]  Chief Complaint  Patient presents with  . Follow-up    Asthma     Referring provider: Tanda Rockers, MD  HPI: [id number] never smoker followed for lifelong asthma with severe chronic pattern followed in pulmonary for primary care also for hbp and hyperlipidemia. -Sings gospel music, solo/group  (had twin sister-died from brain tumor)  Divorced ([place] in [place]) .   TEST [name]  --chronic severe asthma with best FEV1 2.2 L documented 01/2001 and documented non-adherence Mainly lives off samples of inhalers and nose sprays - Off allergy vaccines x7/2010 >highly allergic skin tests per Dr Annamaria Boots >restart vaccine November 06, 2009>stop 07/30/13 to watch - Spirometry on "good day" 01/14/12 = FEV1 1.54 Ratio 0.58  - 2/24/2017added prn prednisone when needing neb - FENO 04/23/16- 32  -CBC with differential 04/23/2016-eosinophils elevated 7.090%  - Spirometry 2/6/2018FEV1 1.23 (49%) Ratio 60  -Colon 2016 - no further colons needed.  -PVX (2010)  , Prevnar (2015) , TDAP 2015)   03/24/2018 Follow up ; Asthma/HTN/Med Review  Patient returns for a 6-week follow-up.  Patient has known chronic asthma.  He says he is doing well on his Symbicort.  He denies any increased albuterol use.  Denies flare of cough or wheezing.  Has hypertension..  Blood pressure is controlled currently on diet lifestyle modifications. Blood pressure is 114/78 today. Walks 2-3 times a week at [facility] .   We reviewed his medications, updated his medication encounter with patient education.  Appears to be taking correctly.Marland Kitchen     No Known Allergies  Immunization History  Administered Date(s) Administered  . Pneumococcal Conjugate-13 07/25/2014  . Pneumococcal Polysaccharide-23 12/23/2008  . Tdap 07/25/2014    Past Medical History:  Diagnosis Date  . Acid reflux   . Asthma 06/18/2011  . Borderline hypertension   .  Cataract   . Colon polyps 2011   1 Hyperplastic and 1 Tubular Adenoma   . Dysphagia   . Esophageal stricture   . GERD (gastroesophageal reflux disease)   . Hx of adenomatous colonic polyps 06/25/2015  . Personal history of colonic polyps 12/1989  . Shingles   . Shoulder pain, right   . Unspecified asthma(493.90)   . Urinary frequency     Tobacco History: Social History   Tobacco Use  Smoking Status Never Smoker  Smokeless Tobacco Never Used   Counseling given: Not Answered   Outpatient Encounter Medications as of 03/24/2018  Medication Sig  . albuterol (PROAIR HFA) 108 (90 Base) MCG/ACT inhaler 2 puffs every 4 hours as needed for wheezing ((PLANB))  . albuterol (PROVENTIL) (2.5 MG/3ML) 0.083% nebulizer solution USE 1 VIAL IN NEBULIZER EVERY 4 HOURS AS NEEDED  . aspirin 81 MG EC tablet Take 81 mg by mouth every morning.   . budesonide-formoterol (SYMBICORT) 160-4.5 MCG/ACT inhaler Inhale 2 puffs into the lungs 2 (two) times daily.  . famotidine (PEPCID) 20 MG tablet Take 20 mg by mouth daily as needed.   Marland Kitchen guaifenesin (MUCUS RELIEF) 400 MG TABS tablet Take 2-3 tablets every 12 hours as needed for thick mucus/congestion and cough  . mometasone-formoterol (DULERA) 200-5 MCG/ACT AERO Inhale 2 puffs into the lungs 2 (two) times daily.  . Naproxen Sodium (ALEVE) 220 MG CAPS Per bottle as needed for joint pain  . oxymetazoline (AFRIN) 0.05 % nasal spray 2 Puffs twice daliy x 5 days, off x 2 weeks for nasal stuffiness  .  predniSONE (DELTASONE) 10 MG tablet 2 until better, 1 daily x 3 days, stop----Pt uses as needed for asthma flares.  . predniSONE (DELTASONE) 10 MG tablet TAKE 2 TABS UNTIL BETTER, 1 TAB DAILY X 3 DAYS, STOP FOR ASTHMA FLARE  . Respiratory Therapy Supplies (FLUTTER) DEVI Use every 4 hours as needed for cough and congestion  . Simethicone (GAS-X EXTRA STRENGTH) 125 MG CAPS Per box as needed for gas and bloating  . mometasone (NASONEX) 50 MCG/ACT nasal spray Place 1 spray into  the nose 2 (two) times daily.   No facility-administered encounter medications on file as of 03/24/2018.      Review of Systems  Constitutional:   No  weight loss, night sweats,  Fevers, chills, fatigue, or  lassitude.  HEENT:   No headaches,  Difficulty swallowing,  Tooth/dental problems, or  Sore throat,                No sneezing, itching, ear ache,  +nasal congestion, post nasal drip,   CV:  No chest pain,  Orthopnea, PND, swelling in lower extremities, anasarca, dizziness, palpitations, syncope.   GI  No heartburn, indigestion, abdominal pain, nausea, vomiting, diarrhea, change in bowel habits, loss of appetite, bloody stools.   Resp: No shortness of breath with exertion or at rest.  No excess mucus, no productive cough,  No non-productive cough,  No coughing up of blood.  No change in color of mucus.  No wheezing.  No chest wall deformity  Skin: no rash or lesions.  GU: no dysuria, change in color of urine, no urgency or frequency.  No flank pain, no hematuria   MS:  No joint pain or swelling.  No decreased range of motion.  No back pain.    Physical Exam  BP 114/78 (BP Location: Left Arm, Cuff Size: Normal)   Pulse 73   Ht 5\' 8"  (1.727 m)   Wt 193 lb (87.5 kg)   SpO2 96%   BMI 29.35 kg/m   GEN: A/Ox3; pleasant , NAD, well nourished    HEENT:  [place]/AT,  EACs-clear, TMs-wnl, NOSE-clear, THROAT-clear, no lesions, no postnasal drip or exudate noted.   NECK:  Supple w/ fair ROM; no JVD; normal carotid impulses w/o bruits; no thyromegaly or nodules palpated; no lymphadenopathy.    RESP  Clear  P & A; w/o, wheezes/ rales/ or rhonchi. no accessory muscle use, no dullness to percussion  CARD:  RRR, no m/r/g, no peripheral edema, pulses intact, no cyanosis or clubbing.  GI:   Soft & nt; nml bowel sounds; no organomegaly or masses detected.   Musco: Warm bil, no deformities or joint swelling noted.   Neuro: alert, no focal deficits noted.    Skin: Warm, no lesions or  rashes    Lab Results:  CBC    Component Value Date/Time   WBC 7.9 06/19/2017 0938   RBC 4.90 06/19/2017 0938   HGB 14.5 06/19/2017 0938   HCT 42.9 06/19/2017 0938   PLT 352.0 06/19/2017 0938   MCV 87.5 06/19/2017 0938   MCH 28.5 03/10/2016 0457   MCHC 33.7 06/19/2017 0938   RDW 13.9 06/19/2017 0938   LYMPHSABS 3.4 06/19/2017 0938   MONOABS 0.6 06/19/2017 0938   EOSABS 0.5 06/19/2017 0938   BASOSABS 0.1 06/19/2017 0938    BMET    Component Value Date/Time   NA 145 06/19/2017 0938   K 3.9 06/19/2017 0938   CL 108 06/19/2017 0938   CO2 28 06/19/2017 5852  GLUCOSE 99 06/19/2017 0938   BUN 11 06/19/2017 0938   CREATININE 1.30 06/19/2017 0938   CALCIUM 9.6 06/19/2017 0938   GFRNONAA 49 (L) 03/10/2016 0457   GFRAA 57 (L) 03/10/2016 0457    BNP No results found for: BNP  ProBNP No results found for: PROBNP  Imaging: No results found.   Assessment & Plan:   Essential hypertension, benign Controlled well on diet and lifestyle modifications continue on low-salt diet along with exercise.  Seasonal and perennial allergic rhinitis Continue on current regimen.  Add Zyrtec as needed  Severe persistent asthma Currently controlled without flare Patient's medications were reviewed today and patient education was given. Computerized medication calendar was adjusted/completed   Plan  Patient Instructions  Continue on current regimen .  Follow med calendar closely and bring to each visit.  Low fat cholesterol diet.  Low salt diet.  Follow up Dr. Melvyn Novas in 2-3 months for physical  And As needed          Rexene Edison, NP 03/24/2018

## 2018-03-24 NOTE — Assessment & Plan Note (Signed)
Currently controlled without flare Patient's medications were reviewed today and patient education was given. Computerized medication calendar was adjusted/completed   Plan  Patient Instructions  Continue on current regimen .  Follow med calendar closely and bring to each visit.  Low fat cholesterol diet.  Low salt diet.  Follow up Dr. Melvyn Novas in 2-3 months for physical  And As needed

## 2018-03-24 NOTE — Assessment & Plan Note (Signed)
Continue on current regimen.  Add Zyrtec as needed

## 2018-03-24 NOTE — Patient Instructions (Signed)
Continue on current regimen .  Follow med calendar closely and bring to each visit.  Low fat cholesterol diet.  Low salt diet.  Follow up Dr. Melvyn Novas in 2-3 months for physical  And As needed

## 2018-03-24 NOTE — Progress Notes (Signed)
Chart and office note reviewed in detail  > agree with a/p as outlined    

## 2018-03-24 NOTE — Addendum Note (Signed)
Addended by: Parke Poisson E on: 03/24/2018 10:35 AM   Modules accepted: Orders

## 2018-03-25 DIAGNOSIS — M9903 Segmental and somatic dysfunction of lumbar region: Secondary | ICD-10-CM | POA: Diagnosis not present

## 2018-03-25 DIAGNOSIS — M791 Myalgia, unspecified site: Secondary | ICD-10-CM | POA: Diagnosis not present

## 2018-03-25 DIAGNOSIS — M9901 Segmental and somatic dysfunction of cervical region: Secondary | ICD-10-CM | POA: Diagnosis not present

## 2018-03-25 DIAGNOSIS — M9902 Segmental and somatic dysfunction of thoracic region: Secondary | ICD-10-CM | POA: Diagnosis not present

## 2018-03-30 DIAGNOSIS — M9901 Segmental and somatic dysfunction of cervical region: Secondary | ICD-10-CM | POA: Diagnosis not present

## 2018-03-30 DIAGNOSIS — M9903 Segmental and somatic dysfunction of lumbar region: Secondary | ICD-10-CM | POA: Diagnosis not present

## 2018-03-30 DIAGNOSIS — M791 Myalgia, unspecified site: Secondary | ICD-10-CM | POA: Diagnosis not present

## 2018-03-30 DIAGNOSIS — M9902 Segmental and somatic dysfunction of thoracic region: Secondary | ICD-10-CM | POA: Diagnosis not present

## 2018-03-31 DIAGNOSIS — M9902 Segmental and somatic dysfunction of thoracic region: Secondary | ICD-10-CM | POA: Diagnosis not present

## 2018-03-31 DIAGNOSIS — M9903 Segmental and somatic dysfunction of lumbar region: Secondary | ICD-10-CM | POA: Diagnosis not present

## 2018-03-31 DIAGNOSIS — M9901 Segmental and somatic dysfunction of cervical region: Secondary | ICD-10-CM | POA: Diagnosis not present

## 2018-03-31 DIAGNOSIS — M791 Myalgia, unspecified site: Secondary | ICD-10-CM | POA: Diagnosis not present

## 2018-04-01 DIAGNOSIS — M9903 Segmental and somatic dysfunction of lumbar region: Secondary | ICD-10-CM | POA: Diagnosis not present

## 2018-04-01 DIAGNOSIS — M9902 Segmental and somatic dysfunction of thoracic region: Secondary | ICD-10-CM | POA: Diagnosis not present

## 2018-04-01 DIAGNOSIS — L723 Sebaceous cyst: Secondary | ICD-10-CM | POA: Diagnosis not present

## 2018-04-01 DIAGNOSIS — M9901 Segmental and somatic dysfunction of cervical region: Secondary | ICD-10-CM | POA: Diagnosis not present

## 2018-04-06 DIAGNOSIS — M9901 Segmental and somatic dysfunction of cervical region: Secondary | ICD-10-CM | POA: Diagnosis not present

## 2018-04-06 DIAGNOSIS — M9902 Segmental and somatic dysfunction of thoracic region: Secondary | ICD-10-CM | POA: Diagnosis not present

## 2018-04-06 DIAGNOSIS — M9903 Segmental and somatic dysfunction of lumbar region: Secondary | ICD-10-CM | POA: Diagnosis not present

## 2018-04-07 DIAGNOSIS — M9901 Segmental and somatic dysfunction of cervical region: Secondary | ICD-10-CM | POA: Diagnosis not present

## 2018-04-07 DIAGNOSIS — M9902 Segmental and somatic dysfunction of thoracic region: Secondary | ICD-10-CM | POA: Diagnosis not present

## 2018-04-07 DIAGNOSIS — M9903 Segmental and somatic dysfunction of lumbar region: Secondary | ICD-10-CM | POA: Diagnosis not present

## 2018-04-08 DIAGNOSIS — M9901 Segmental and somatic dysfunction of cervical region: Secondary | ICD-10-CM | POA: Diagnosis not present

## 2018-04-08 DIAGNOSIS — M9902 Segmental and somatic dysfunction of thoracic region: Secondary | ICD-10-CM | POA: Diagnosis not present

## 2018-04-08 DIAGNOSIS — M9903 Segmental and somatic dysfunction of lumbar region: Secondary | ICD-10-CM | POA: Diagnosis not present

## 2018-04-13 ENCOUNTER — Other Ambulatory Visit: Payer: Self-pay | Admitting: Internal Medicine

## 2018-04-13 DIAGNOSIS — M9902 Segmental and somatic dysfunction of thoracic region: Secondary | ICD-10-CM | POA: Diagnosis not present

## 2018-04-13 DIAGNOSIS — M9903 Segmental and somatic dysfunction of lumbar region: Secondary | ICD-10-CM | POA: Diagnosis not present

## 2018-04-13 DIAGNOSIS — M9901 Segmental and somatic dysfunction of cervical region: Secondary | ICD-10-CM | POA: Diagnosis not present

## 2018-04-15 DIAGNOSIS — M9902 Segmental and somatic dysfunction of thoracic region: Secondary | ICD-10-CM | POA: Diagnosis not present

## 2018-04-15 DIAGNOSIS — M9903 Segmental and somatic dysfunction of lumbar region: Secondary | ICD-10-CM | POA: Diagnosis not present

## 2018-04-15 DIAGNOSIS — M9901 Segmental and somatic dysfunction of cervical region: Secondary | ICD-10-CM | POA: Diagnosis not present

## 2018-04-20 DIAGNOSIS — M9902 Segmental and somatic dysfunction of thoracic region: Secondary | ICD-10-CM | POA: Diagnosis not present

## 2018-04-20 DIAGNOSIS — M9901 Segmental and somatic dysfunction of cervical region: Secondary | ICD-10-CM | POA: Diagnosis not present

## 2018-04-20 DIAGNOSIS — M9903 Segmental and somatic dysfunction of lumbar region: Secondary | ICD-10-CM | POA: Diagnosis not present

## 2018-04-23 DIAGNOSIS — M9901 Segmental and somatic dysfunction of cervical region: Secondary | ICD-10-CM | POA: Diagnosis not present

## 2018-04-23 DIAGNOSIS — M791 Myalgia, unspecified site: Secondary | ICD-10-CM | POA: Diagnosis not present

## 2018-04-23 DIAGNOSIS — M9902 Segmental and somatic dysfunction of thoracic region: Secondary | ICD-10-CM | POA: Diagnosis not present

## 2018-04-23 DIAGNOSIS — M9903 Segmental and somatic dysfunction of lumbar region: Secondary | ICD-10-CM | POA: Diagnosis not present

## 2018-04-27 DIAGNOSIS — M9902 Segmental and somatic dysfunction of thoracic region: Secondary | ICD-10-CM | POA: Diagnosis not present

## 2018-04-27 DIAGNOSIS — M791 Myalgia, unspecified site: Secondary | ICD-10-CM | POA: Diagnosis not present

## 2018-04-27 DIAGNOSIS — M9901 Segmental and somatic dysfunction of cervical region: Secondary | ICD-10-CM | POA: Diagnosis not present

## 2018-04-27 DIAGNOSIS — M9903 Segmental and somatic dysfunction of lumbar region: Secondary | ICD-10-CM | POA: Diagnosis not present

## 2018-04-29 DIAGNOSIS — M9903 Segmental and somatic dysfunction of lumbar region: Secondary | ICD-10-CM | POA: Diagnosis not present

## 2018-04-29 DIAGNOSIS — M791 Myalgia, unspecified site: Secondary | ICD-10-CM | POA: Diagnosis not present

## 2018-04-29 DIAGNOSIS — M9902 Segmental and somatic dysfunction of thoracic region: Secondary | ICD-10-CM | POA: Diagnosis not present

## 2018-04-29 DIAGNOSIS — M9901 Segmental and somatic dysfunction of cervical region: Secondary | ICD-10-CM | POA: Diagnosis not present

## 2018-05-05 DIAGNOSIS — M9904 Segmental and somatic dysfunction of sacral region: Secondary | ICD-10-CM | POA: Diagnosis not present

## 2018-05-05 DIAGNOSIS — M791 Myalgia, unspecified site: Secondary | ICD-10-CM | POA: Diagnosis not present

## 2018-05-05 DIAGNOSIS — M9902 Segmental and somatic dysfunction of thoracic region: Secondary | ICD-10-CM | POA: Diagnosis not present

## 2018-05-05 DIAGNOSIS — M9901 Segmental and somatic dysfunction of cervical region: Secondary | ICD-10-CM | POA: Diagnosis not present

## 2018-05-05 DIAGNOSIS — M9903 Segmental and somatic dysfunction of lumbar region: Secondary | ICD-10-CM | POA: Diagnosis not present

## 2018-05-06 DIAGNOSIS — M9901 Segmental and somatic dysfunction of cervical region: Secondary | ICD-10-CM | POA: Diagnosis not present

## 2018-05-06 DIAGNOSIS — M9902 Segmental and somatic dysfunction of thoracic region: Secondary | ICD-10-CM | POA: Diagnosis not present

## 2018-05-06 DIAGNOSIS — M791 Myalgia, unspecified site: Secondary | ICD-10-CM | POA: Diagnosis not present

## 2018-05-06 DIAGNOSIS — M9903 Segmental and somatic dysfunction of lumbar region: Secondary | ICD-10-CM | POA: Diagnosis not present

## 2018-05-06 DIAGNOSIS — M9904 Segmental and somatic dysfunction of sacral region: Secondary | ICD-10-CM | POA: Diagnosis not present

## 2018-05-13 DIAGNOSIS — L723 Sebaceous cyst: Secondary | ICD-10-CM | POA: Diagnosis not present

## 2018-05-14 DIAGNOSIS — M9903 Segmental and somatic dysfunction of lumbar region: Secondary | ICD-10-CM | POA: Diagnosis not present

## 2018-05-14 DIAGNOSIS — M9901 Segmental and somatic dysfunction of cervical region: Secondary | ICD-10-CM | POA: Diagnosis not present

## 2018-05-14 DIAGNOSIS — M9902 Segmental and somatic dysfunction of thoracic region: Secondary | ICD-10-CM | POA: Diagnosis not present

## 2018-05-14 DIAGNOSIS — M791 Myalgia, unspecified site: Secondary | ICD-10-CM | POA: Diagnosis not present

## 2018-05-19 DIAGNOSIS — M9902 Segmental and somatic dysfunction of thoracic region: Secondary | ICD-10-CM | POA: Diagnosis not present

## 2018-05-19 DIAGNOSIS — M9903 Segmental and somatic dysfunction of lumbar region: Secondary | ICD-10-CM | POA: Diagnosis not present

## 2018-05-19 DIAGNOSIS — M791 Myalgia, unspecified site: Secondary | ICD-10-CM | POA: Diagnosis not present

## 2018-05-19 DIAGNOSIS — M9901 Segmental and somatic dysfunction of cervical region: Secondary | ICD-10-CM | POA: Diagnosis not present

## 2018-05-25 ENCOUNTER — Ambulatory Visit: Payer: Medicare Other | Admitting: Internal Medicine

## 2018-06-09 ENCOUNTER — Ambulatory Visit (INDEPENDENT_AMBULATORY_CARE_PROVIDER_SITE_OTHER): Payer: Medicare Other | Admitting: Internal Medicine

## 2018-06-09 ENCOUNTER — Encounter: Payer: Self-pay | Admitting: Internal Medicine

## 2018-06-09 VITALS — BP 128/70 | HR 81 | Ht 68.0 in | Wt 197.4 lb

## 2018-06-09 DIAGNOSIS — J4550 Severe persistent asthma, uncomplicated: Secondary | ICD-10-CM

## 2018-06-09 MED ORDER — BUDESONIDE-FORMOTEROL FUMARATE 160-4.5 MCG/ACT IN AERO: 2.0000 | INHALATION_SPRAY | Freq: Two times a day (BID) | RESPIRATORY_TRACT | 1 refills | Status: DC

## 2018-06-09 NOTE — Patient Instructions (Signed)
No change in medications  Please schedule a follow up office visit in 4 weeks, sooner if needed to see [name] or Aaron Edelman NP

## 2018-06-09 NOTE — Progress Notes (Signed)
Subjective:   Patient ID: [name]    DOB: 25-Sep-1941     Brief patient profile: 25  yobm denies ever smoking but has lifelong asthma with severe chronic pattern and best FEV1 of around 2.2 liters documented 2002 and also documented non-adherence  followed in pulmonary for primary care also for hbp and hyperlipidemia.   History of Present Illness    07/30/13 Allergy eval rx completed x 4 years > d/c'd and f/u allergy prn    04/03/2015 Follow up : chronic asthma/ HTN/non adherence / relies on samples/ no formulary / has med calendar  Pt returns for 1 month follow up .  Doing well with no asthma flare , on Dulera . Marland Kitchen  Need to establish with dentist > multiple teeth extracted   05/06/2016 NP Follow up ; Severe chronic asthma  Patient presents for 1 month follow up and med review We reviewed all his medications organize them into a medication count with patient education Appears he is taking medications correctly. However, does depend on samples. He did not have his Pepcid or Protonix today but says he has them at home. Says overall his breathing is doing well without any flare cough or wheezing. Seen by allergy Dr. Annamaria Boots on 04/23/16   , IgE 203, elevated eos Looking into Nucala.  Prevnar and PVX utd.  [name] , travel to churches to sing. 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 Is able to sing without sob.  rec No change rx   06/04/16 Dr Annamaria Boots rec Nucala > could not afford deductible         10/24/2017  f/u ov/Rital Cavey re: severe chronic asthma on prn pred/ no med calendar  Chief Complaint  Patient presents with  . Follow-up    Pt c/o increased cough with yellow sputum for the past wk.  He is on a pred taper currently.   dulera is on 0 "has another one at home, just finished this am / apparently only prescription he ever purchases is pred and saba as we have samples of neither Uses alb at least twice daily when "overdoes it" No longer taking ARB rec Ok to leave off benicar at this point Work on inhaler  technique:       02/19/2018  f/u ov/Aleathia Purdy re:  Chronic severe asthma / add libbing on meds (pepcid) and not following med cal  Chief Complaint  Patient presents with  . Follow-up    c/o increased cough for the past several days- prod with minimal clear sputum.  He has not used proair but has used his neb x 2 in the past wk.    Dyspnea:  Worse x sev weeks Cough: clear sputum/ worse x sev days afters after supper  Sleep: on side/ horizontal s noct cough/ wheeze SABA use:  Tends to use more neb as cheaper  rec Change the pepcid to where you take 20 mg one after bfast and one after supper See calendar for specific medication instructions     06/09/2018  f/u ov/Kip Cropp re: severe asthma/ confused again with meds/ no med calendar in hand  Chief Complaint  Patient presents with  . Follow-up    follow up, denies problems, feels that asthma is under control,dulera twice per day  Dyspnea:  Walking a lot slow pace/ able to do gxt x one hour s stopping Cough: no Sleep: ok 1-2 pillows  SABA use:  Using symbicort 160 as rescue as can't afford proair   No obvious day to day or daytime  variability or assoc excess/ purulent sputum or mucus plugs or hemoptysis or cp or chest tightness, subjective wheeze or overt sinus or hb symptoms. No unusual exposure hx or h/o childhood pna/ asthma or knowledge of premature birth.  Sleeping  Ok on 2 pillows   without nocturnal  or early am exacerbation  of respiratory  c/o's or need for noct saba. Also denies any obvious fluctuation of symptoms with weather or environmental changes or other aggravating or alleviating factors except as outlined above   Current Allergies, Complete Past Medical History, Past Surgical History, Family History, and Social History were reviewed in [employer] record.  ROS  The following are not active complaints unless bolded Hoarseness, sore throat, dysphagia, dental problems, itching, sneezing,  nasal congestion or  discharge of excess mucus or purulent secretions, ear ache,   fever, chills, sweats, unintended wt loss or wt gain, classically pleuritic or exertional cp,  orthopnea pnd or arm/hand swelling  or leg swelling, presyncope, palpitations, abdominal pain, anorexia, nausea, vomiting, diarrhea  or change in bowel habits or change in bladder habits, change in stools or change in urine, dysuria, hematuria,  rash, arthralgias, visual complaints, headache, numbness, weakness or ataxia or problems with walking or coordination,  change in mood or  memory.        Current Meds  Medication Sig  . albuterol (PROAIR HFA) 108 (90 Base) MCG/ACT inhaler 2 puffs every 4 hours as needed for wheezing ((PLANB))  . albuterol (PROVENTIL) (2.5 MG/3ML) 0.083% nebulizer solution USE 1 VIAL IN NEBULIZER EVERY 4 HOURS AS NEEDED  . aspirin 81 MG EC tablet Take 81 mg by mouth every morning.   . famotidine (PEPCID) 20 MG tablet Take 20 mg by mouth daily as needed.   Marland Kitchen guaifenesin (MUCUS RELIEF) 400 MG TABS tablet Take 2-3 tablets every 12 hours as needed for thick mucus/congestion and cough  . mometasone-formoterol (DULERA) 200-5 MCG/ACT AERO Inhale 2 puffs into the lungs 2 (two) times daily.  . Naproxen Sodium (ALEVE) 220 MG CAPS Per bottle as needed for joint pain  . oxymetazoline (AFRIN) 0.05 % nasal spray 2 Puffs twice daliy x 5 days, off x 2 weeks for nasal stuffiness  . predniSONE (DELTASONE) 10 MG tablet TAKE 2 TABS UNTIL BETTER, 1 TAB DAILY X 3 DAYS, STOP FOR ASTHMA FLARE  . Respiratory Therapy Supplies (FLUTTER) DEVI Use every 4 hours as needed for cough and congestion  . Simethicone (GAS-X EXTRA STRENGTH) 125 MG CAPS Per box as needed for gas and bloating  .                  Past Medical Hx ASTHMA (ICD-493.90)  --chronic severe asthma with best FEV1 2.2 L documented 01/2001 and  documented non-adherence Mainly lives off samples of inhalers and nose sprays  - HFA 75% June 15, 2009 > 90% August 02, 2009 > 100%  December 27, 2009  - Off allergy vaccines x7/2010 > highly allergic skin tests per Dr Annamaria Boots > restarted vaccine November 06, 2009 > stopped  07/2013  SHOULDER PAIN, RIGHT (ICD-719.41)   HYPERTENSION, BORDERLINE (ICD-401.9)  Shingles T 8 Left 9/01  HEALTH MAINTENANCE...............................................................................................Marland KitchenWert  - Colonoscopy 12/1989,  2011  - dT 07/25/2014  - Pneumovax 3/04 and 05/2009 - Prevnar 07/25/14 -Flu declined October 17, 2008 and September 21, 2009 , declined flu shot 2015, 2017  -CPX 07/25/2014              Objective:   Physical Exam  amb bm nad    05/23/2014  201> 201 07/04/2014 > 07/25/14  196>198 08/22/2014 >194 09/19/2014 >198 10/11/2014 > 10/24/2014  196>   01/02/2015 193 > 03/06/2015  198 >198 04/03/2015 > 05/16/2015 196 >  06/02/2015 196 >197 06/16/2015 > 12/12/2015 199> 197 01/08/2016 >   02/16/2016     194 > 04/08/2016  193> 08/06/2016    196 > 12/31/2016  191 > 01/28/2017  191  > 06/19/2017  192 > 10/24/2017   190 > 02/19/2018  192 > 06/09/2018 197  Vital signs reviewed - Note on arrival 02 sats  93% on RA     HEENT: nl   turbinates bilaterally, and oropharynx. Nl external ear canals without cough reflex - very poor dentition, several badly decayed molars    NECK :  without JVD/Nodes/TM/ nl carotid upstrokes bilaterally   LUNGS: no acc muscle use,  slt barrel chest with distant bs/ mod hyper resonant to percussion bilaterally / no wheeze  CV:  RRR  no s3 or murmur or increase in P2, and no edema   ABD:  soft and nontender with nl inspiratory excursion in the supine position. No bruits or organomegaly appreciated, bowel sounds nl  MS:  Nl gait/ ext warm without deformities, calf tenderness, cyanosis or clubbing No obvious joint restrictions   SKIN: warm and dry without lesions    NEURO:  alert, approp, nl sensorium with  no motor or cerebellar deficits apparent.

## 2018-06-15 ENCOUNTER — Encounter: Payer: Self-pay | Admitting: Internal Medicine

## 2018-06-15 NOTE — Assessment & Plan Note (Signed)
--  chronic severe asthma with best FEV1 2.2 L documented 01/2001 and documented non-adherence Mainly lives off samples of inhalers and nose sprays  - Off allergy vaccines x7/2010 > highly allergic skin tests per Dr Annamaria Boots > restart vaccine November 06, 2009>stop 07/30/13 to watch - Spirometry on "good day" 01/14/12 =  FEV1 1.54  Ratio 0.58  - 02/16/2016 added prn prednisone when needing neb - FENO 04/23/16- 32  -CBC with differential 04/23/2016-eosinophils elevated 7.090%  - Spirometry 01/28/2017  FEV1 1.23 (49%)  Ratio 60    - 06/09/2018  After extensive coaching inhaler device  effectiveness =    75%    Despite severe asthma/ poor insight and dependence on samples he's been able to stay out of ER by coming here monthly for f/u and have explored every option with him to improve the above to no avail so continue present meds/ f/u monthly  See device teaching which extended face to face time for this visit   Each maintenance medication was reviewed in detail including most importantly the difference between maintenance and as needed and under what circumstances the prns are to be used.  Please see AVS for specific  Instructions which are unique to this visit and I personally typed out  which were reviewed in detail in writing with the patient and a copy provided.

## 2018-06-24 DIAGNOSIS — M791 Myalgia, unspecified site: Secondary | ICD-10-CM | POA: Diagnosis not present

## 2018-06-24 DIAGNOSIS — M9903 Segmental and somatic dysfunction of lumbar region: Secondary | ICD-10-CM | POA: Diagnosis not present

## 2018-06-24 DIAGNOSIS — M9901 Segmental and somatic dysfunction of cervical region: Secondary | ICD-10-CM | POA: Diagnosis not present

## 2018-06-24 DIAGNOSIS — M9902 Segmental and somatic dysfunction of thoracic region: Secondary | ICD-10-CM | POA: Diagnosis not present

## 2018-06-29 DIAGNOSIS — M9901 Segmental and somatic dysfunction of cervical region: Secondary | ICD-10-CM | POA: Diagnosis not present

## 2018-06-29 DIAGNOSIS — M545 Low back pain: Secondary | ICD-10-CM | POA: Diagnosis not present

## 2018-06-30 DIAGNOSIS — M545 Low back pain: Secondary | ICD-10-CM | POA: Diagnosis not present

## 2018-06-30 DIAGNOSIS — M9901 Segmental and somatic dysfunction of cervical region: Secondary | ICD-10-CM | POA: Diagnosis not present

## 2018-07-03 DIAGNOSIS — M545 Low back pain: Secondary | ICD-10-CM | POA: Diagnosis not present

## 2018-07-03 DIAGNOSIS — M9901 Segmental and somatic dysfunction of cervical region: Secondary | ICD-10-CM | POA: Diagnosis not present

## 2018-07-06 NOTE — Progress Notes (Signed)
@Patient  ID: [name], male    DOB: 05-24-1941, 77 y.o.   MRN: [id number]  Chief Complaint  Patient presents with  . Follow-up    States his breathing has been good. No new concerns. Needs refill on flonase.     Referring provider: Tanda Rockers, MD  HPI: 77 year old male never smoker patient of Dr. Melvyn Novas.  Followed in this office for pulmonary as well as primary care.  Patient with severe chronic asthma. PMH: Hypertension and hyperlipidemia  Recent [facility] Encounters:   03/24/2018 Follow up ; Asthma/HTN/Med Review  Patient returns for a 6-week follow-up.  Patient has known chronic asthma.  He says he is doing well on his Symbicort.  He denies any increased albuterol use.  Denies flare of cough or wheezing. Has hypertension..  Blood pressure is controlled currently on diet lifestyle modifications. Blood pressure is 114/78 today. Walks 2-3 times a week at [facility] .  We reviewed his medications, updated his medication encounter with patient education.  Appears to be taking correctly..  06/09/2018  f/u ov/Wert re: severe asthma/ confused again with meds/ no med calendar in hand  Chief Complaint  Patient presents with  . Follow-up    follow up, denies problems, feels that asthma is under control,dulera twice per day  Dyspnea:  Walking a lot slow pace/ able to do gxt x one hour s stopping Cough: no Sleep: ok 1-2 pillows  SABA use:  Using symbicort 160 as rescue as can't afford proair   Tests:  --chronic severe asthma with best FEV1 2.2 L documented 01/2001 and documented non-adherence Mainly lives off samples of inhalers and nose sprays - Off allergy vaccines x7/2010 >highly allergic skin tests per Dr Annamaria Boots >restart vaccine November 06, 2009>stop 07/30/13 to watch - Spirometry on "good day" 01/14/12 = FEV1 1.54 Ratio 0.58  - 2/24/2017added prn prednisone when needing neb - FENO 04/23/16- 32  -CBC with differential 04/23/2016-eosinophils elevated 7.090%  - Spirometry  2/6/2018FEV1 1.23 (49%) Ratio 60  -Colon 2016 - no further colons needed.  -PVX (2010)  , Prevnar (2015) , TDAP 2015)     07/07/18 OV   77 year old patient of Dr. Melvyn Novas presents the office today for asthma follow-up.  Patient reports he is doing well.  Patient does admit that he needs to avoid to stay out of the heat.  Patient reports he does not go outside from 12 PM to 4 PM.  Patient is requesting Symbicort samples but has been adherent to it.  Patient denies using Laurence Ferrari since last office visit.  Patient has not used nebulizer since last office visit.  Patient feels that his breathing is doing quite well.  Patient denies any sleeping issues, cough, wheezing, fever, chest tightness.  No Known Allergies  Immunization History  Administered Date(s) Administered  . Pneumococcal Conjugate-13 07/25/2014  . Pneumococcal Polysaccharide-23 12/23/2008  . Tdap 07/25/2014  Encouraged to get flu vaccine when flu season starts  Past Medical History:  Diagnosis Date  . Acid reflux   . Asthma 06/18/2011  . Borderline hypertension   . Cataract   . Colon polyps 2011   1 Hyperplastic and 1 Tubular Adenoma   . Dysphagia   . Esophageal stricture   . GERD (gastroesophageal reflux disease)   . Hx of adenomatous colonic polyps 06/25/2015  . Personal history of colonic polyps 12/1989  . Shingles   . Shoulder pain, right   . Unspecified asthma(493.90)   . Urinary frequency     Tobacco History: Social  History   Tobacco Use  Smoking Status Never Smoker  Smokeless Tobacco Never Used   Counseling given: Yes Continue non-smoking  Outpatient Encounter Medications as of 07/07/2018  Medication Sig  . albuterol (PROAIR HFA) 108 (90 Base) MCG/ACT inhaler 2 puffs every 4 hours as needed for wheezing ((PLANB))  . albuterol (PROVENTIL) (2.5 MG/3ML) 0.083% nebulizer solution USE 1 VIAL IN NEBULIZER EVERY 4 HOURS AS NEEDED  . aspirin 81 MG EC tablet Take 81 mg by mouth every morning.   .  budesonide-formoterol (SYMBICORT) 160-4.5 MCG/ACT inhaler Inhale 2 puffs into the lungs 2 (two) times daily.  . famotidine (PEPCID) 20 MG tablet Take 20 mg by mouth daily as needed.   Marland Kitchen guaifenesin (MUCUS RELIEF) 400 MG TABS tablet Take 2-3 tablets every 12 hours as needed for thick mucus/congestion and cough  . Naproxen Sodium (ALEVE) 220 MG CAPS Per bottle as needed for joint pain  . oxymetazoline (AFRIN) 0.05 % nasal spray 2 Puffs twice daliy x 5 days, off x 2 weeks for nasal stuffiness  . predniSONE (DELTASONE) 10 MG tablet TAKE 2 TABS UNTIL BETTER, 1 TAB DAILY X 3 DAYS, STOP FOR ASTHMA FLARE  . Respiratory Therapy Supplies (FLUTTER) DEVI Use every 4 hours as needed for cough and congestion  . Simethicone (GAS-X EXTRA STRENGTH) 125 MG CAPS Per box as needed for gas and bloating  . budesonide-formoterol (SYMBICORT) 160-4.5 MCG/ACT inhaler Inhale 2 puffs into the lungs every 12 (twelve) hours.  . mometasone (NASONEX) 50 MCG/ACT nasal spray Place 1 spray into the nose 2 (two) times daily.   No facility-administered encounter medications on file as of 07/07/2018.      Review of Systems  Review of Systems  Constitutional: Negative for activity change, chills, fatigue, fever and unexpected weight change.  HENT: Negative for postnasal drip, rhinorrhea, sinus pressure, sinus pain, sneezing and sore throat.   Respiratory: Negative for cough, chest tightness, shortness of breath and wheezing.        Reports control  Cardiovascular: Negative for chest pain and palpitations.  Gastrointestinal: Negative for constipation, diarrhea, nausea and vomiting.  Genitourinary: Negative for hematuria and urgency.  Musculoskeletal: Negative for arthralgias.  Skin: Negative for color change.  Neurological: Negative for dizziness, seizures and headaches.  Psychiatric/Behavioral: Negative for dysphoric mood. The patient is not nervous/anxious.   All other systems reviewed and are negative.    Physical  Exam  BP (!) 158/90   Pulse 89   Ht 5\' 8"  (1.727 m)   Wt 196 lb 3.2 oz (89 kg)   SpO2 94%   BMI 29.83 kg/m   Wt Readings from Last 5 Encounters:  07/07/18 196 lb 3.2 oz (89 kg)  06/09/18 197 lb 6.4 oz (89.5 kg)  03/24/18 193 lb (87.5 kg)  02/19/18 192 lb (87.1 kg)  11/21/17 189 lb 3.2 oz (85.8 kg)     Physical Exam  Constitutional: He is oriented to person, place, and time and well-developed, well-nourished, and in no distress. No distress.  HENT:  Head: Normocephalic and atraumatic.  Right Ear: Hearing, tympanic membrane, external ear and ear canal normal.  Left Ear: Hearing, tympanic membrane, external ear and ear canal normal.  Nose: Nose normal. Right sinus exhibits no maxillary sinus tenderness and no frontal sinus tenderness. Left sinus exhibits no maxillary sinus tenderness and no frontal sinus tenderness.  Mouth/Throat: Uvula is midline and oropharynx is clear and moist. No oropharyngeal exudate.  Eyes: Pupils are equal, round, and reactive to light.  Neck: Normal  range of motion. Neck supple. No JVD present.  Cardiovascular: Normal rate, regular rhythm and normal heart sounds.  Pulmonary/Chest: Effort normal and breath sounds normal. No accessory muscle usage. No respiratory distress. He has no decreased breath sounds. He has no wheezes. He has no rhonchi.  Abdominal: Soft. Bowel sounds are normal. There is no tenderness.  Musculoskeletal: Normal range of motion. He exhibits no edema.  Lymphadenopathy:    He has no cervical adenopathy.  Neurological: He is alert and oriented to person, place, and time. Gait normal.  Skin: Skin is warm and dry. He is not diaphoretic. No erythema.  Psychiatric: Mood, memory, affect and judgment normal.  Nursing note and vitals reviewed.   Lab Results:  CBC    Component Value Date/Time   WBC 7.9 06/19/2017 0938   RBC 4.90 06/19/2017 0938   HGB 14.5 06/19/2017 0938   HCT 42.9 06/19/2017 0938   PLT 352.0 06/19/2017 0938   MCV  87.5 06/19/2017 0938   MCH 28.5 03/10/2016 0457   MCHC 33.7 06/19/2017 0938   RDW 13.9 06/19/2017 0938   LYMPHSABS 3.4 06/19/2017 0938   MONOABS 0.6 06/19/2017 0938   EOSABS 0.5 06/19/2017 0938   BASOSABS 0.1 06/19/2017 0938    BMET    Component Value Date/Time   NA 145 06/19/2017 0938   K 3.9 06/19/2017 0938   CL 108 06/19/2017 0938   CO2 28 06/19/2017 0938   GLUCOSE 99 06/19/2017 0938   BUN 11 06/19/2017 0938   CREATININE 1.30 06/19/2017 0938   CALCIUM 9.6 06/19/2017 0938   GFRNONAA 49 (L) 03/10/2016 0457   GFRAA 57 (L) 03/10/2016 0457    BNP No results found for: BNP  ProBNP No results found for: PROBNP  Imaging: No results found.   Assessment & Plan:   Pleasant 77 year old patient seen in office today.  Patient provided with Symbicort 160 sample.  Patient to continue with respiratory plan as discussed.   Patient to contact our office if you are having to use your rescue inhaler.  If you have difficulties obtaining Symbicort or unable to remain on Symbicort please contact our office.  Discussed with patient her blood pressure was elevated today in office.  We will need to monitor.  If remains elevated at further visits we may need to consider restarting blood pressure medications.  Patient was recently on Benicar reported that he could not afford it.  This was stopped in November/2018.  Patient agrees.  GERD Continue Pepcid Continue to adhere to GERD diet  Severe persistent asthma Stable interval today  Symbicort samples provided today in office >>> 2 puffs in the morning right when you wake up, rinse out your mouth after use, 12 hours later 2 puffs, rinse after use >>> Take this daily, no matter what >>> This is not a rescue inhaler   Contact our office if you have to use your prednisone that Dr. Melvyn Novas is provided for you  Follow-up with our office in 4 to 6 weeks  Use your rescue inhaler for as needed shortness of breath and wheezing Only use your  albuterol as a rescue medication to be used if you can't catch your breath by resting or doing a relaxed purse lip breathing pattern.  - The less you use it, the better it will work when you need it. - Ok to use up to 2 puffs  every 4 hours if you must but call for immediate appointment if use goes up over your usual need -  Don't leave home without it !!  (think of it like the spare tire for your car)    Healthcare maintenance Encourage patient to consider flu vaccine in the fall  Essential hypertension, benign Elevated blood pressure results today in office.  Previous readings recently in office have been controlled.  Monitor for now.  If this remains elevated may need to restart blood pressure meds that were stopped in 10/24/2017 due to cost.  Patient was previously on Shelly, NP 07/07/2018

## 2018-07-07 ENCOUNTER — Encounter: Payer: Self-pay | Admitting: Pulmonary Disease

## 2018-07-07 ENCOUNTER — Ambulatory Visit (INDEPENDENT_AMBULATORY_CARE_PROVIDER_SITE_OTHER): Payer: Medicare Other | Admitting: Pulmonary Disease

## 2018-07-07 VITALS — BP 158/90 | HR 89 | Ht 68.0 in | Wt 196.2 lb

## 2018-07-07 DIAGNOSIS — K2100 Gastro-esophageal reflux disease with esophagitis, without bleeding: Secondary | ICD-10-CM

## 2018-07-07 DIAGNOSIS — J4550 Severe persistent asthma, uncomplicated: Secondary | ICD-10-CM | POA: Diagnosis not present

## 2018-07-07 DIAGNOSIS — I10 Essential (primary) hypertension: Secondary | ICD-10-CM

## 2018-07-07 DIAGNOSIS — Z0000 Encounter for general adult medical examination without abnormal findings: Secondary | ICD-10-CM | POA: Diagnosis not present

## 2018-07-07 MED ORDER — BUDESONIDE-FORMOTEROL FUMARATE 160-4.5 MCG/ACT IN AERO: 2.0000 | INHALATION_SPRAY | Freq: Two times a day (BID) | RESPIRATORY_TRACT | 6 refills | Status: DC

## 2018-07-07 NOTE — Assessment & Plan Note (Signed)
Stable interval today  Symbicort samples provided today in office >>> 2 puffs in the morning right when you wake up, rinse out your mouth after use, 12 hours later 2 puffs, rinse after use >>> Take this daily, no matter what >>> This is not a rescue inhaler   Contact our office if you have to use your prednisone that Dr. Melvyn Novas is provided for you  Follow-up with our office in 4 to 6 weeks  Use your rescue inhaler for as needed shortness of breath and wheezing Only use your albuterol as a rescue medication to be used if you can't catch your breath by resting or doing a relaxed purse lip breathing pattern.  - The less you use it, the better it will work when you need it. - Ok to use up to 2 puffs  every 4 hours if you must but call for immediate appointment if use goes up over your usual need - Don't leave home without it !!  (think of it like the spare tire for your car)

## 2018-07-07 NOTE — Patient Instructions (Signed)
Symbicort samples provided today in office >>> 2 puffs in the morning right when you wake up, rinse out your mouth after use, 12 hours later 2 puffs, rinse after use >>> Take this daily, no matter what >>> This is not a rescue inhaler   Contact our office if you have to use your prednisone that Dr. Melvyn Novas is provided for you  Follow-up with our office in 4 to 6 weeks  Use your rescue inhaler for as needed shortness of breath and wheezing Only use your albuterol as a rescue medication to be used if you can't catch your breath by resting or doing a relaxed purse lip breathing pattern.  - The less you use it, the better it will work when you need it. - Ok to use up to 2 puffs  every 4 hours if you must but call for immediate appointment if use goes up over your usual need - Don't leave home without it !!  (think of it like the spare tire for your car)       Please contact the office if your symptoms worsen or you have concerns that you are not improving.   Thank you for choosing [facility] for your healthcare, and for allowing [place] to partner with you on your healthcare journey. I am thankful to be able to provide care to you today.   Wyn Quaker FNP-C

## 2018-07-07 NOTE — Assessment & Plan Note (Signed)
Encourage patient to consider flu vaccine in the fall

## 2018-07-07 NOTE — Progress Notes (Signed)
Chart and office note reviewed in detail  > agree with a/p as outlined    

## 2018-07-07 NOTE — Assessment & Plan Note (Signed)
Elevated blood pressure results today in office.  Previous readings recently in office have been controlled.  Monitor for now.  If this remains elevated may need to restart blood pressure meds that were stopped in 10/24/2017 due to cost.  Patient was previously on Benicar

## 2018-07-07 NOTE — Assessment & Plan Note (Signed)
Continue Pepcid Continue to adhere to GERD diet

## 2018-07-08 DIAGNOSIS — M791 Myalgia, unspecified site: Secondary | ICD-10-CM | POA: Diagnosis not present

## 2018-07-08 DIAGNOSIS — M9902 Segmental and somatic dysfunction of thoracic region: Secondary | ICD-10-CM | POA: Diagnosis not present

## 2018-07-08 DIAGNOSIS — M9901 Segmental and somatic dysfunction of cervical region: Secondary | ICD-10-CM | POA: Diagnosis not present

## 2018-07-08 DIAGNOSIS — M9903 Segmental and somatic dysfunction of lumbar region: Secondary | ICD-10-CM | POA: Diagnosis not present

## 2018-07-15 DIAGNOSIS — M545 Low back pain: Secondary | ICD-10-CM | POA: Diagnosis not present

## 2018-07-15 DIAGNOSIS — M9904 Segmental and somatic dysfunction of sacral region: Secondary | ICD-10-CM | POA: Diagnosis not present

## 2018-07-15 DIAGNOSIS — M9902 Segmental and somatic dysfunction of thoracic region: Secondary | ICD-10-CM | POA: Diagnosis not present

## 2018-07-15 DIAGNOSIS — M9903 Segmental and somatic dysfunction of lumbar region: Secondary | ICD-10-CM | POA: Diagnosis not present

## 2018-07-15 DIAGNOSIS — M791 Myalgia, unspecified site: Secondary | ICD-10-CM | POA: Diagnosis not present

## 2018-07-15 DIAGNOSIS — M9901 Segmental and somatic dysfunction of cervical region: Secondary | ICD-10-CM | POA: Diagnosis not present

## 2018-07-23 DIAGNOSIS — M9904 Segmental and somatic dysfunction of sacral region: Secondary | ICD-10-CM | POA: Diagnosis not present

## 2018-07-23 DIAGNOSIS — M9902 Segmental and somatic dysfunction of thoracic region: Secondary | ICD-10-CM | POA: Diagnosis not present

## 2018-07-23 DIAGNOSIS — M545 Low back pain: Secondary | ICD-10-CM | POA: Diagnosis not present

## 2018-07-23 DIAGNOSIS — M9901 Segmental and somatic dysfunction of cervical region: Secondary | ICD-10-CM | POA: Diagnosis not present

## 2018-07-23 DIAGNOSIS — M9903 Segmental and somatic dysfunction of lumbar region: Secondary | ICD-10-CM | POA: Diagnosis not present

## 2018-07-29 DIAGNOSIS — M9904 Segmental and somatic dysfunction of sacral region: Secondary | ICD-10-CM | POA: Diagnosis not present

## 2018-07-29 DIAGNOSIS — M9903 Segmental and somatic dysfunction of lumbar region: Secondary | ICD-10-CM | POA: Diagnosis not present

## 2018-07-29 DIAGNOSIS — M9902 Segmental and somatic dysfunction of thoracic region: Secondary | ICD-10-CM | POA: Diagnosis not present

## 2018-07-29 DIAGNOSIS — M545 Low back pain: Secondary | ICD-10-CM | POA: Diagnosis not present

## 2018-07-29 DIAGNOSIS — M9901 Segmental and somatic dysfunction of cervical region: Secondary | ICD-10-CM | POA: Diagnosis not present

## 2018-08-11 ENCOUNTER — Encounter: Payer: Self-pay | Admitting: Internal Medicine

## 2018-08-11 ENCOUNTER — Ambulatory Visit (INDEPENDENT_AMBULATORY_CARE_PROVIDER_SITE_OTHER): Payer: Medicare Other | Admitting: Internal Medicine

## 2018-08-11 VITALS — BP 120/80 | HR 60 | Ht 67.0 in | Wt 195.0 lb

## 2018-08-11 DIAGNOSIS — I10 Essential (primary) hypertension: Secondary | ICD-10-CM | POA: Diagnosis not present

## 2018-08-11 DIAGNOSIS — J4550 Severe persistent asthma, uncomplicated: Secondary | ICD-10-CM

## 2018-08-11 LAB — POCT EXHALED NITRIC OXIDE: FeNO level (ppb): 30

## 2018-08-11 MED ORDER — BUDESONIDE-FORMOTEROL FUMARATE 160-4.5 MCG/ACT IN AERO: 2.0000 | INHALATION_SPRAY | Freq: Two times a day (BID) | RESPIRATORY_TRACT | 0 refills | Status: DC

## 2018-08-11 NOTE — Patient Instructions (Addendum)
Work on inhaler technique:  relax and gently blow all the way out then take a nice smooth deep breath back in, triggering the inhaler at same time you start breathing in.  Hold for up to 5 seconds if you can. Blow out thru nose. Rinse and gargle with water when done  Only use your albuterol as a rescue medication to be used if you can't catch your breath by resting or doing a relaxed purse lip breathing pattern.  - The less you use it, the better it will work when you need it. - Ok to use up to 2 puffs  every 4 hours if you must but call for immediate appointment if use goes up over your usual need - Don't leave home without it !!  (think of it like the spare tire for your car)       See [name] in 4  weeks with all your medications, even over the counter meds, separated in two separate bags, the ones you take no matter what vs the ones you stop once you feel better and take only as needed when you feel you need them.   Tammy  will generate for you a new user friendly medication calendar that will put [place] all on the same page re: your medication use.   - needs bmet next ov

## 2018-08-11 NOTE — Progress Notes (Signed)
Subjective:   Patient ID: [name]    DOB: 10-Jun-1941     Brief patient profile: 44  yobm denies ever smoking but has lifelong asthma with severe chronic pattern and best FEV1 of around 2.2 liters documented 2002 and also documented non-adherence  followed in pulmonary for primary care also for hbp and hyperlipidemia.   History of Present Illness    07/30/13 Allergy eval rx completed x 4 years > d/c'd and f/u allergy prn    04/03/2015 Follow up : chronic asthma/ HTN/non adherence / relies on samples/ no formulary / has med calendar  Pt returns for 1 month follow up .  Doing well with no asthma flare , on Dulera . Marland Kitchen  Need to establish with dentist > multiple teeth extracted   05/06/2016 NP Follow up ; Severe chronic asthma  Patient presents for 1 month follow up and med review We reviewed all his medications organize them into a medication count with patient education Appears he is taking medications correctly. However, does depend on samples. He did not have his Pepcid or Protonix today but says he has them at home. Says overall his breathing is doing well without any flare cough or wheezing. Seen by allergy Dr. Annamaria Boots on 04/23/16   , IgE 203, elevated eos Looking into Nucala.  Prevnar and PVX utd.  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 Is able to sing without sob.  rec No change rx   06/04/16 Dr Annamaria Boots rec Nucala > could not afford deductible         10/24/2017  f/u ov/Yulia Ulrich re: severe chronic asthma on prn pred/ no med calendar  Chief Complaint  Patient presents with  . Follow-up    Pt c/o increased cough with yellow sputum for the past wk.  He is on a pred taper currently.   dulera is on 0 "has another one at home, just finished this am / apparently only prescription he ever purchases is pred and saba as we have samples of neither Uses alb at least twice daily when "overdoes it" No longer taking ARB rec Ok to leave off benicar at this point Work on inhaler  technique:       02/19/2018  f/u ov/Sherryl Valido re:  Chronic severe asthma / add libbing on meds (pepcid) and not following med cal  Chief Complaint  Patient presents with  . Follow-up    c/o increased cough for the past several days- prod with minimal clear sputum.  He has not used proair but has used his neb x 2 in the past wk.    Dyspnea:  Worse x sev weeks Cough: clear sputum/ worse x sev days afters after supper  Sleep: on side/ horizontal s noct cough/ wheeze SABA use:  Tends to use more neb as cheaper  rec Change the pepcid to where you take 20 mg one after bfast and one after supper See calendar for specific medication instructions         08/11/2018  f/u ov/Livi Mcgann re: severe asthma/ sample reliant as can't afford plan d medicare  Chief Complaint  Patient presents with  . Follow-up    Pt states he has been doing well since last visit and denies any current complaints.  Dyspnea:  MMRC2 = can't walk a nl pace on a flat grade s sob but does fine slow and flat shopping ok Cough: no Sleeping: props up on 2 pillows  SABA use: no hfa on hand and  can't tell me what color it is 02: none     No obvious day to day or daytime variability or assoc excess/ purulent sputum or mucus plugs or hemoptysis or cp or chest tightness, subjective wheeze or overt sinus or hb symptoms.   Sleeps as above  without nocturnal  or early am exacerbation  of respiratory  c/o's or need for noct saba. Also denies any obvious fluctuation of symptoms with weather or environmental changes or other aggravating or alleviating factors except as outlined above   No unusual exposure hx or h/o childhood pna/ asthma or knowledge of premature birth.  Current Allergies, Complete Past Medical History, Past Surgical History, Family History, and Social History were reviewed in [employer] record.  ROS  The following are not active complaints unless bolded Hoarseness, sore throat, dysphagia, dental  problems, itching, sneezing,  nasal congestion or discharge of excess mucus or purulent secretions, ear ache,   fever, chills, sweats, unintended wt loss or wt gain, classically pleuritic or exertional cp,  orthopnea pnd or arm/hand swelling  or leg swelling, presyncope, palpitations, abdominal pain, anorexia, nausea, vomiting, diarrhea  or change in bowel habits or change in bladder habits, change in stools or change in urine, dysuria, hematuria,  rash, arthralgias, visual complaints, headache, numbness, weakness or ataxia or problems with walking or coordination,  change in mood or  memory.        Current Meds - NOTE:   Unable to verify as accurately reflecting what pt takes     Medication Sig  . albuterol (PROAIR HFA) 108 (90 Base) MCG/ACT inhaler 2 puffs every 4 hours as needed for wheezing ((PLANB))  . albuterol (PROVENTIL) (2.5 MG/3ML) 0.083% nebulizer solution USE 1 VIAL IN NEBULIZER EVERY 4 HOURS AS NEEDED  . aspirin 81 MG EC tablet Take 81 mg by mouth every morning.   . budesonide-formoterol (SYMBICORT) 160-4.5 MCG/ACT inhaler Inhale 2 puffs into the lungs every 12 (twelve) hours.  . famotidine (PEPCID) 20 MG tablet Take 20 mg by mouth daily as needed.   Marland Kitchen guaifenesin (MUCUS RELIEF) 400 MG TABS tablet Take 2-3 tablets every 12 hours as needed for thick mucus/congestion and cough  . Naproxen Sodium (ALEVE) 220 MG CAPS Per bottle as needed for joint pain  . oxymetazoline (AFRIN) 0.05 % nasal spray 2 Puffs twice daliy x 5 days, off x 2 weeks for nasal stuffiness  . predniSONE (DELTASONE) 10 MG tablet TAKE 2 TABS UNTIL BETTER, 1 TAB DAILY X 3 DAYS, STOP FOR ASTHMA FLARE  . Respiratory Therapy Supplies (FLUTTER) DEVI Use every 4 hours as needed for cough and congestion  . Simethicone (GAS-X EXTRA STRENGTH) 125 MG CAPS Per box as needed for gas and bloating  .                  Past Medical Hx ASTHMA (ICD-493.90)  --chronic severe asthma with best FEV1 2.2 L documented 01/2001 and   documented non-adherence Mainly lives off samples of inhalers and nose sprays  - HFA 75% June 15, 2009 > 90% August 02, 2009 > 100% December 27, 2009  - Off allergy vaccines x7/2010 > highly allergic skin tests per Dr Annamaria Boots > restarted vaccine November 06, 2009 > stopped  07/2013  SHOULDER PAIN, RIGHT (ICD-719.41)   HYPERTENSION, BORDERLINE (ICD-401.9)  Shingles T 8 Left 9/01  HEALTH MAINTENANCE...............................................................................................Marland KitchenWert  - Colonoscopy 12/1989,  2011  - dT 07/25/2014  - Pneumovax 3/04 and 05/2009 - Prevnar 07/25/14 -Flu declined October 17, 2008 and September 21, 2009 , declined flu shot 2015, 2017  -CPX 07/25/2014              Objective:   Physical Exam       amb bm nad    05/23/2014  201> 201 07/04/2014 > 07/25/14  196>198 08/22/2014 >194 09/19/2014 >198 10/11/2014 > 10/24/2014  196>   01/02/2015 193 > 03/06/2015  198 >198 04/03/2015 > 05/16/2015 196 >  06/02/2015 196 >197 06/16/2015 > 12/12/2015 199> 197 01/08/2016 >   02/16/2016     194 > 04/08/2016  193> 08/06/2016    196 > 12/31/2016  191 > 01/28/2017  191  > 06/19/2017  192 > 10/24/2017   190 > 02/19/2018  192 > 06/09/2018 197> 08/11/2018    195   Vital signs reviewed - Note on arrival 02 sats  95% on RA  And bp 120/80 off bp meds      HEENT: nl   turbinates bilaterally, and oropharynx. Nl external ear canals without cough reflex - very poor dentition, several badly decayed molars ,  slt barrel chest with distant bs/ mod hyper resonant to percussion bilaterally / no wheeze    HEENT: Poor dention/ nl   oropharynx. Nl external ear canals without cough reflex -  Mild bilateral non-specific turbinate edema     NECK :  without JVD/Nodes/TM/ nl carotid upstrokes bilaterally   LUNGS: no acc muscle use,  Mild barrel  contour chest wall with bilateral  Distant bs s audible wheeze and  without cough on insp or exp maneuver and mild  Hyperresonant  to  percussion bilaterally     CV:  RRR   no s3 or murmur or increase in P2, and no edema   ABD:  soft and nontender with pos late  insp Hoover's  in the supine position. No bruits or organomegaly appreciated, bowel sounds nl  MS:   Nl gait/  ext warm without deformities, calf tenderness, cyanosis or clubbing No obvious joint restrictions   SKIN: warm and dry without lesions    NEURO:  alert, approp, nl sensorium with  no motor or cerebellar deficits apparent.

## 2018-08-12 ENCOUNTER — Encounter: Payer: Self-pay | Admitting: Internal Medicine

## 2018-08-12 DIAGNOSIS — M9903 Segmental and somatic dysfunction of lumbar region: Secondary | ICD-10-CM | POA: Diagnosis not present

## 2018-08-12 DIAGNOSIS — M9902 Segmental and somatic dysfunction of thoracic region: Secondary | ICD-10-CM | POA: Diagnosis not present

## 2018-08-12 DIAGNOSIS — M9901 Segmental and somatic dysfunction of cervical region: Secondary | ICD-10-CM | POA: Diagnosis not present

## 2018-08-12 DIAGNOSIS — M545 Low back pain: Secondary | ICD-10-CM | POA: Diagnosis not present

## 2018-08-12 NOTE — Assessment & Plan Note (Addendum)
--  chronic severe asthma with best FEV1 2.2 L documented 01/2001 and documented non-adherence Mainly lives off samples of inhalers and nose sprays  - Off allergy vaccines x7/2010 > highly allergic skin tests per Dr Annamaria Boots > restart vaccine November 06, 2009>stop 07/30/13 to watch - Spirometry on "good day" 01/14/12 =  FEV1 1.54  Ratio 0.58  - 02/16/2016 added prn prednisone when needing neb - FENO 04/23/16- 32  -CBC with differential 04/23/2016-eosinophils elevated 7.090%  - Spirometry 01/28/2017  FEV1 1.23 (49%)  Ratio 60     - Spirometry 08/11/2018  FEV1 1.43 (60%)  Ratio 60  - 08/11/2018  After extensive coaching inhaler device,  effectiveness =    75% from a baseline of 75% - FENO 08/11/2018  =   30    All goals of chronic asthma control met including optimal (though no where near nl as he has component of fixed obst) function and elimination of symptoms with minimal need for rescue therapy which I advised him he should still always keep on hand   Contingencies discussed in full including contacting this office immediately if not controlling the symptoms using the rule of two's.      I had an extended discussion with the patient reviewing all relevant studies completed to date and  lasting 15 to 20 minutes of a 25 minute visit    See device teaching which extended face to face time for this visit   Each maintenance medication was reviewed in detail including most importantly the difference between maintenance and prns and under what circumstances the prns are to be triggered using an action plan format that is not reflected in the computer generated alphabetically organized AVS but trather by a customized med calendar that reflects the AVS meds with confirmed 100% correlation.   In addition, Please see AVS for unique instructions that I personally wrote and verbalized to the the pt in detail and then reviewed with pt  by my nurse highlighting any  changes in therapy recommended at today's visit to  their plan of care.

## 2018-08-12 NOTE — Assessment & Plan Note (Addendum)
B/p elevated at ov at 160/84 09/05/2011 >>rx benicar 40mg  1/2 daily along w/ lifesytle changes  - 10/24/2017 informed that no longer taking any meds/ "can't afford"> bp ok off rx   ekg inadvertently labeled as this and corrected   Adequate control on present rx, reviewed in detail with pt > no change in rx needed = diet only   Lab Results  Component Value Date   CREATININE 1.30 06/19/2017   CREATININE 1.37 (H) 03/10/2016   CREATININE 1.43 (H) 05/30/2015     - needs bmet next ov

## 2018-08-20 DIAGNOSIS — M9902 Segmental and somatic dysfunction of thoracic region: Secondary | ICD-10-CM | POA: Diagnosis not present

## 2018-08-20 DIAGNOSIS — M9903 Segmental and somatic dysfunction of lumbar region: Secondary | ICD-10-CM | POA: Diagnosis not present

## 2018-08-20 DIAGNOSIS — M9901 Segmental and somatic dysfunction of cervical region: Secondary | ICD-10-CM | POA: Diagnosis not present

## 2018-09-04 DIAGNOSIS — M545 Low back pain: Secondary | ICD-10-CM | POA: Diagnosis not present

## 2018-09-04 DIAGNOSIS — M9901 Segmental and somatic dysfunction of cervical region: Secondary | ICD-10-CM | POA: Diagnosis not present

## 2018-09-08 ENCOUNTER — Ambulatory Visit: Payer: Medicare Other | Admitting: Internal Medicine

## 2018-09-08 ENCOUNTER — Encounter: Payer: Self-pay | Admitting: Adult Health

## 2018-09-08 ENCOUNTER — Ambulatory Visit: Payer: Medicare Other | Admitting: Adult Health

## 2018-09-08 ENCOUNTER — Ambulatory Visit (INDEPENDENT_AMBULATORY_CARE_PROVIDER_SITE_OTHER): Payer: Medicare Other | Admitting: Adult Health

## 2018-09-08 DIAGNOSIS — J302 Other seasonal allergic rhinitis: Secondary | ICD-10-CM

## 2018-09-08 DIAGNOSIS — J4550 Severe persistent asthma, uncomplicated: Secondary | ICD-10-CM | POA: Diagnosis not present

## 2018-09-08 DIAGNOSIS — J3089 Other allergic rhinitis: Secondary | ICD-10-CM | POA: Diagnosis not present

## 2018-09-08 DIAGNOSIS — I10 Essential (primary) hypertension: Secondary | ICD-10-CM | POA: Diagnosis not present

## 2018-09-08 MED ORDER — BUDESONIDE-FORMOTEROL FUMARATE 160-4.5 MCG/ACT IN AERO: 2.0000 | INHALATION_SPRAY | Freq: Two times a day (BID) | RESPIRATORY_TRACT | 0 refills | Status: DC

## 2018-09-08 NOTE — Patient Instructions (Signed)
Continue on current regimen .  Low fat cholesterol diet.  Low salt diet.  Follow up Dr. Melvyn Novas in 3 months and As needed

## 2018-09-08 NOTE — Progress Notes (Signed)
@Patient  ID: [name], male    DOB: 1941/11/11, 77 y.o.   MRN: [id number]  Chief Complaint  Patient presents with  . Follow-up    Asthma    Referring provider: Tanda Rockers, MD  HPI: [id number] never smoker followed for lifelong asthma with severe chronic pattern followed in pulmonary for primary care also for hbp and hyperlipidemia. -Sings gospel music, solo/group (had twin sister-died from brain tumor)  Divorced ([place] in [place]) .   TEST [name]  --chronic severe asthma with best FEV1 2.2 L documented 01/2001 and documented non-adherence Mainly lives off samples of inhalers and nose sprays - Off allergy vaccines x7/2010 >highly allergic skin tests per Dr Annamaria Boots >restart vaccine November 06, 2009>stop 07/30/13 to watch - Spirometry on "good day" 01/14/12 = FEV1 1.54 Ratio 0.58  - 2/24/2017added prn prednisone when needing neb - FENO 04/23/16- 32  -CBC with differential 04/23/2016-eosinophils elevated 7.090%  - Spirometry 2/6/2018FEV1 1.23 (49%) Ratio 60  -Colon 2016 - no further colons needed.  -PVX (2010)  , Prevnar (2015) , TDAP 2015)  -declines flu shot .    09/08/2018 Follow up : Chronic Asthma , Med Review  Patient presents for a one-month follow-up.  Patient has underlying chronic asthma.  He is on Symbicort.  He says overall breathing has been stable.  He denies a flare of cough or wheezing.  Denies any increased albuterol use.  Patient has borderline  hypertension.  Not on any meds currently , prefers not rx if possible . Blood pressure is 140/84 . Says he tries to walk each week at least 2-3 times a week. Goes to [employer] some . Active outside.  Discussed healthy diet . No headache or speech changes.   Declines flu shot.   Doing well , travels doing gospel music    No Known Allergies  Immunization History  Administered Date(s) Administered  . Pneumococcal Conjugate-13 07/25/2014  . Pneumococcal Polysaccharide-23 12/23/2008  . Tdap 07/25/2014     Past Medical History:  Diagnosis Date  . Acid reflux   . Asthma 06/18/2011  . Borderline hypertension   . Cataract   . Colon polyps 2011   1 Hyperplastic and 1 Tubular Adenoma   . Dysphagia   . Esophageal stricture   . GERD (gastroesophageal reflux disease)   . Hx of adenomatous colonic polyps 06/25/2015  . Personal history of colonic polyps 12/1989  . Shingles   . Shoulder pain, right   . Unspecified asthma(493.90)   . Urinary frequency     Tobacco History: Social History   Tobacco Use  Smoking Status Never Smoker  Smokeless Tobacco Never Used   Counseling given: Not Answered   Outpatient Medications Prior to Visit  Medication Sig Dispense Refill  . albuterol (PROAIR HFA) 108 (90 Base) MCG/ACT inhaler 2 puffs every 4 hours as needed for wheezing ((PLANB)) 1 Inhaler 5  . albuterol (PROVENTIL) (2.5 MG/3ML) 0.083% nebulizer solution USE 1 VIAL IN NEBULIZER EVERY 4 HOURS AS NEEDED 75 mL 3  . aspirin 81 MG EC tablet Take 81 mg by mouth every morning.     . budesonide-formoterol (SYMBICORT) 160-4.5 MCG/ACT inhaler Inhale 2 puffs into the lungs every 12 (twelve) hours. 1 Inhaler 6  . budesonide-formoterol (SYMBICORT) 160-4.5 MCG/ACT inhaler Inhale 2 puffs into the lungs 2 (two) times daily. 1 Inhaler 0  . famotidine (PEPCID) 20 MG tablet Take 20 mg by mouth daily as needed.     Marland Kitchen guaifenesin (MUCUS RELIEF) 400 MG TABS tablet Take  2-3 tablets every 12 hours as needed for thick mucus/congestion and cough    . mometasone (NASONEX) 50 MCG/ACT nasal spray Place 1 spray into the nose 2 (two) times daily. 7.5 g 0  . Naproxen Sodium (ALEVE) 220 MG CAPS Per bottle as needed for joint pain    . oxymetazoline (AFRIN) 0.05 % nasal spray 2 Puffs twice daliy x 5 days, off x 2 weeks for nasal stuffiness    . predniSONE (DELTASONE) 10 MG tablet TAKE 2 TABS UNTIL BETTER, 1 TAB DAILY X 3 DAYS, STOP FOR ASTHMA FLARE 100 tablet 0  . Respiratory Therapy Supplies (FLUTTER) DEVI Use every 4 hours as  needed for cough and congestion    . Simethicone (GAS-X EXTRA STRENGTH) 125 MG CAPS Per box as needed for gas and bloating     No facility-administered medications prior to visit.      Review of Systems  Constitutional:   No  weight loss, night sweats,  Fevers, chills, fatigue, or  lassitude.  HEENT:   No headaches,  Difficulty swallowing,  Tooth/dental problems, or  Sore throat,                No sneezing, itching, ear ache,  +nasal congestion, post nasal drip,   CV:  No chest pain,  Orthopnea, PND, swelling in lower extremities, anasarca, dizziness, palpitations, syncope.   GI  No heartburn, indigestion, abdominal pain, nausea, vomiting, diarrhea, change in bowel habits, loss of appetite, bloody stools.   Resp:  No chest wall deformity  Skin: no rash or lesions.  GU: no dysuria, change in color of urine, no urgency or frequency.  No flank pain, no hematuria   MS:  No joint pain or swelling.  No decreased range of motion.  No back pain.    Physical Exam  BP 140/84 (BP Location: Left Arm, Patient Position: Sitting, Cuff Size: Normal)   Pulse 78   Ht 5' 7.5" (1.715 m)   Wt 197 lb 9.6 oz (89.6 kg)   SpO2 95%   BMI 30.49 kg/m   GEN: A/Ox3; pleasant , NAD, elderly    HEENT:  [place]/AT,  EACs-clear, TMs-wnl, NOSE-clear, THROAT-clear, no lesions, no postnasal drip or exudate noted. Poor dentition   NECK:  Supple w/ fair ROM; no JVD; normal carotid impulses w/o bruits; no thyromegaly or nodules palpated; no lymphadenopathy.    RESP  Clear  P & A; w/o, wheezes/ rales/ or rhonchi. no accessory muscle use, no dullness to percussion  CARD:  RRR, no m/r/g, no peripheral edema, pulses intact, no cyanosis or clubbing.  GI:   Soft & nt; nml bowel sounds; no organomegaly or masses detected.   Musco: Warm bil, no deformities or joint swelling noted.   Neuro: alert, no focal deficits noted.    Skin: Warm, no lesions or rashes    Lab Results:  CBC    Component Value Date/Time    WBC 7.9 06/19/2017 0938   RBC 4.90 06/19/2017 0938   HGB 14.5 06/19/2017 0938   HCT 42.9 06/19/2017 0938   PLT 352.0 06/19/2017 0938   MCV 87.5 06/19/2017 0938   MCH 28.5 03/10/2016 0457   MCHC 33.7 06/19/2017 0938   RDW 13.9 06/19/2017 0938   LYMPHSABS 3.4 06/19/2017 0938   MONOABS 0.6 06/19/2017 0938   EOSABS 0.5 06/19/2017 0938   BASOSABS 0.1 06/19/2017 0938    BMET    Component Value Date/Time   NA 145 06/19/2017 0938   K 3.9 06/19/2017 5993  CL 108 06/19/2017 0938   CO2 28 06/19/2017 0938   GLUCOSE 99 06/19/2017 0938   BUN 11 06/19/2017 0938   CREATININE 1.30 06/19/2017 0938   CALCIUM 9.6 06/19/2017 0938   GFRNONAA 49 (L) 03/10/2016 0457   GFRAA 57 (L) 03/10/2016 0457    BNP No results found for: BNP  ProBNP No results found for: PROBNP  Imaging: No results found.   Assessment & Plan:   Severe persistent asthma Controlled on present regimen   Plan  Patient Instructions  Continue on current regimen .  Low fat cholesterol diet.  Low salt diet.  Follow up Dr. Melvyn Novas in 3 months and As needed       Seasonal and perennial allergic rhinitis Controlled on rx    Essential hypertension, benign Low salt diet       Rexene Edison, NP 09/08/2018

## 2018-09-08 NOTE — Assessment & Plan Note (Signed)
Controlled on present regimen   Plan  Patient Instructions  Continue on current regimen .  Low fat cholesterol diet.  Low salt diet.  Follow up Dr. Melvyn Novas in 3 months and As needed

## 2018-09-08 NOTE — Assessment & Plan Note (Signed)
Controlled on rx   

## 2018-09-08 NOTE — Assessment & Plan Note (Signed)
Low salt diet

## 2018-09-13 NOTE — Progress Notes (Signed)
Chart and office note reviewed in detail  > agree with a/p as outlined    

## 2018-09-14 DIAGNOSIS — M9901 Segmental and somatic dysfunction of cervical region: Secondary | ICD-10-CM | POA: Diagnosis not present

## 2018-09-14 DIAGNOSIS — M545 Low back pain: Secondary | ICD-10-CM | POA: Diagnosis not present

## 2018-09-25 DIAGNOSIS — M545 Low back pain: Secondary | ICD-10-CM | POA: Diagnosis not present

## 2018-09-25 DIAGNOSIS — M9903 Segmental and somatic dysfunction of lumbar region: Secondary | ICD-10-CM | POA: Diagnosis not present

## 2018-09-25 DIAGNOSIS — M9901 Segmental and somatic dysfunction of cervical region: Secondary | ICD-10-CM | POA: Diagnosis not present

## 2018-09-25 DIAGNOSIS — M9902 Segmental and somatic dysfunction of thoracic region: Secondary | ICD-10-CM | POA: Diagnosis not present

## 2018-10-01 DIAGNOSIS — M9901 Segmental and somatic dysfunction of cervical region: Secondary | ICD-10-CM | POA: Diagnosis not present

## 2018-10-01 DIAGNOSIS — M545 Low back pain: Secondary | ICD-10-CM | POA: Diagnosis not present

## 2018-10-01 DIAGNOSIS — M9902 Segmental and somatic dysfunction of thoracic region: Secondary | ICD-10-CM | POA: Diagnosis not present

## 2018-10-01 DIAGNOSIS — M9903 Segmental and somatic dysfunction of lumbar region: Secondary | ICD-10-CM | POA: Diagnosis not present

## 2018-10-02 DIAGNOSIS — M9901 Segmental and somatic dysfunction of cervical region: Secondary | ICD-10-CM | POA: Diagnosis not present

## 2018-10-02 DIAGNOSIS — M545 Low back pain: Secondary | ICD-10-CM | POA: Diagnosis not present

## 2018-10-02 DIAGNOSIS — M9903 Segmental and somatic dysfunction of lumbar region: Secondary | ICD-10-CM | POA: Diagnosis not present

## 2018-10-02 DIAGNOSIS — M9902 Segmental and somatic dysfunction of thoracic region: Secondary | ICD-10-CM | POA: Diagnosis not present

## 2018-10-05 DIAGNOSIS — M9903 Segmental and somatic dysfunction of lumbar region: Secondary | ICD-10-CM | POA: Diagnosis not present

## 2018-10-05 DIAGNOSIS — M9902 Segmental and somatic dysfunction of thoracic region: Secondary | ICD-10-CM | POA: Diagnosis not present

## 2018-10-05 DIAGNOSIS — M545 Low back pain: Secondary | ICD-10-CM | POA: Diagnosis not present

## 2018-10-05 DIAGNOSIS — M9901 Segmental and somatic dysfunction of cervical region: Secondary | ICD-10-CM | POA: Diagnosis not present

## 2018-10-07 DIAGNOSIS — M9903 Segmental and somatic dysfunction of lumbar region: Secondary | ICD-10-CM | POA: Diagnosis not present

## 2018-10-07 DIAGNOSIS — M9901 Segmental and somatic dysfunction of cervical region: Secondary | ICD-10-CM | POA: Diagnosis not present

## 2018-10-07 DIAGNOSIS — M9902 Segmental and somatic dysfunction of thoracic region: Secondary | ICD-10-CM | POA: Diagnosis not present

## 2018-10-07 DIAGNOSIS — M545 Low back pain: Secondary | ICD-10-CM | POA: Diagnosis not present

## 2018-10-09 DIAGNOSIS — M9901 Segmental and somatic dysfunction of cervical region: Secondary | ICD-10-CM | POA: Diagnosis not present

## 2018-10-09 DIAGNOSIS — M9903 Segmental and somatic dysfunction of lumbar region: Secondary | ICD-10-CM | POA: Diagnosis not present

## 2018-10-09 DIAGNOSIS — M545 Low back pain: Secondary | ICD-10-CM | POA: Diagnosis not present

## 2018-10-09 DIAGNOSIS — M9902 Segmental and somatic dysfunction of thoracic region: Secondary | ICD-10-CM | POA: Diagnosis not present

## 2018-10-14 DIAGNOSIS — H52203 Unspecified astigmatism, bilateral: Secondary | ICD-10-CM | POA: Diagnosis not present

## 2018-10-14 DIAGNOSIS — H5213 Myopia, bilateral: Secondary | ICD-10-CM | POA: Diagnosis not present

## 2018-10-14 DIAGNOSIS — Z961 Presence of intraocular lens: Secondary | ICD-10-CM | POA: Diagnosis not present

## 2018-10-27 DIAGNOSIS — M545 Low back pain: Secondary | ICD-10-CM | POA: Diagnosis not present

## 2018-10-27 DIAGNOSIS — M9901 Segmental and somatic dysfunction of cervical region: Secondary | ICD-10-CM | POA: Diagnosis not present

## 2018-11-06 DIAGNOSIS — M9901 Segmental and somatic dysfunction of cervical region: Secondary | ICD-10-CM | POA: Diagnosis not present

## 2018-11-06 DIAGNOSIS — M545 Low back pain: Secondary | ICD-10-CM | POA: Diagnosis not present

## 2018-11-12 DIAGNOSIS — M545 Low back pain: Secondary | ICD-10-CM | POA: Diagnosis not present

## 2018-11-12 DIAGNOSIS — M9901 Segmental and somatic dysfunction of cervical region: Secondary | ICD-10-CM | POA: Diagnosis not present

## 2018-11-16 DIAGNOSIS — M545 Low back pain: Secondary | ICD-10-CM | POA: Diagnosis not present

## 2018-11-16 DIAGNOSIS — M9901 Segmental and somatic dysfunction of cervical region: Secondary | ICD-10-CM | POA: Diagnosis not present

## 2018-11-16 DIAGNOSIS — M9902 Segmental and somatic dysfunction of thoracic region: Secondary | ICD-10-CM | POA: Diagnosis not present

## 2018-11-16 DIAGNOSIS — M9903 Segmental and somatic dysfunction of lumbar region: Secondary | ICD-10-CM | POA: Diagnosis not present

## 2018-11-27 DIAGNOSIS — M545 Low back pain: Secondary | ICD-10-CM | POA: Diagnosis not present

## 2018-11-27 DIAGNOSIS — M791 Myalgia, unspecified site: Secondary | ICD-10-CM | POA: Diagnosis not present

## 2018-11-27 DIAGNOSIS — M9901 Segmental and somatic dysfunction of cervical region: Secondary | ICD-10-CM | POA: Diagnosis not present

## 2018-11-27 DIAGNOSIS — M9902 Segmental and somatic dysfunction of thoracic region: Secondary | ICD-10-CM | POA: Diagnosis not present

## 2018-12-08 ENCOUNTER — Encounter: Payer: Self-pay | Admitting: Internal Medicine

## 2018-12-08 ENCOUNTER — Ambulatory Visit (INDEPENDENT_AMBULATORY_CARE_PROVIDER_SITE_OTHER): Payer: Medicare Other | Admitting: Internal Medicine

## 2018-12-08 VITALS — BP 142/72 | HR 64 | Ht 67.5 in | Wt 192.0 lb

## 2018-12-08 DIAGNOSIS — M9902 Segmental and somatic dysfunction of thoracic region: Secondary | ICD-10-CM | POA: Diagnosis not present

## 2018-12-08 DIAGNOSIS — M791 Myalgia, unspecified site: Secondary | ICD-10-CM | POA: Diagnosis not present

## 2018-12-08 DIAGNOSIS — M9901 Segmental and somatic dysfunction of cervical region: Secondary | ICD-10-CM | POA: Diagnosis not present

## 2018-12-08 DIAGNOSIS — M545 Low back pain: Secondary | ICD-10-CM | POA: Diagnosis not present

## 2018-12-08 DIAGNOSIS — J4550 Severe persistent asthma, uncomplicated: Secondary | ICD-10-CM

## 2018-12-08 MED ORDER — BUDESONIDE-FORMOTEROL FUMARATE 160-4.5 MCG/ACT IN AERO: 2.0000 | INHALATION_SPRAY | Freq: Two times a day (BID) | RESPIRATORY_TRACT | 0 refills | Status: DC

## 2018-12-08 MED ORDER — ALBUTEROL SULFATE HFA 108 (90 BASE) MCG/ACT IN AERS: INHALATION_SPRAY | RESPIRATORY_TRACT | 5 refills | Status: DC

## 2018-12-08 MED ORDER — PREDNISONE 10 MG PO TABS: ORAL_TABLET | ORAL | 5 refills | Status: DC

## 2018-12-08 NOTE — Progress Notes (Signed)
Subjective:   Patient ID: [name]    DOB: 25-Jul-1941     Brief patient profile: 14  yobm denies ever smoking but has lifelong asthma with severe chronic pattern and best FEV1 of around 2.2 liters documented 2002 and also documented non-adherence  followed in pulmonary for primary care also for hbp and hyperlipidemia.   History of Present Illness    07/30/13 Allergy eval rx completed x 4 years > d/c'd and f/u allergy prn    04/03/2015 Follow up : chronic asthma/ HTN/non adherence / relies on samples/ no formulary / has med calendar  Pt returns for 1 month follow up .  Doing well with no asthma flare , on Dulera . Marland Kitchen  Need to establish with dentist > multiple teeth extracted   05/06/2016 NP Follow up ; Severe chronic asthma  Patient presents for 1 month follow up and med review We reviewed all his medications organize them into a medication count with patient education Appears he is taking medications correctly. However, does depend on samples. He did not have his Pepcid or Protonix today but says he has them at home. Says overall his breathing is doing well without any flare cough or wheezing. Seen by allergy Dr. Annamaria Boots on 04/23/16   , IgE 203, elevated eos Looking into Nucala.  Prevnar and PVX utd.  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 Is able to sing without sob.  rec No change rx   06/04/16 Dr Annamaria Boots rec Nucala > could not afford deductible       08/11/2018  f/u ov/ re: severe asthma/ sample reliant as can't afford plan d medicare  Chief Complaint  Patient presents with  . Follow-up    Pt states he has been doing well since last visit and denies any current complaints.  Dyspnea:  MMRC2 = can't walk a nl pace on a flat grade s sob but does fine slow and flat shopping ok Cough: no Sleeping: props up on 2 pillows  SABA use: no hfa on hand and can't tell me what color it is 02: none   rec Work on inhaler technique:  Only use your albuterol as a rescue  medication    12/08/2018  f/u ov/ re: severe asthma/ sample dep has 10 puffs left on sample of symbicort 160- and another one at home/ no med calendar   Chief Complaint  Patient presents with  . Follow-up    Breathing is doing well today. He uses his albuterol   2 x daily  uses neb.  Dyspnea:  MMRC 2 Cough: minimal mucoid not noct Sleeping: ok flat bed / 2 pillows SABA use: only has neb saba now  02: none Has active hb on pepcid 10 mg prn (was supposed to be on 20 bid)  No obvious day to day or daytime variability or assoc excess/ purulent sputum or mucus plugs or hemoptysis or cp or chest tightness, subjective wheeze or overt sinus   symptoms.   Sleeping as above  without nocturnal  or early am exacerbation  of respiratory  c/o's or need for noct saba. Also denies any obvious fluctuation of symptoms with weather or environmental changes or other aggravating or alleviating factors except as outlined above   No unusual exposure hx or h/o childhood pna/ asthma or knowledge of premature birth.  Current Allergies, Complete Past Medical History, Past Surgical History, Family History, and Social History were reviewed in [employer] record.  ROS  The following are not active complaints unless bolded Hoarseness, sore throat, dysphagia, dental problems, itching, sneezing,  nasal congestion or discharge of excess mucus or purulent secretions, ear ache,   fever, chills, sweats, unintended wt loss or wt gain, classically pleuritic or exertional cp,  orthopnea pnd or arm/hand swelling  or leg swelling, presyncope, palpitations, abdominal pain, anorexia, nausea, vomiting, diarrhea  or change in bowel habits or change in bladder habits, change in stools or change in urine, dysuria, hematuria,  rash, arthralgias, visual complaints, headache, numbness, weakness or ataxia or problems with walking or coordination,  change in mood or  memory.        Current Meds  Medication Sig  .  albuterol (PROAIR HFA) 108 (90 Base) MCG/ACT inhaler 2 puffs every 4 hours as needed for wheezing ((PLANB))  . albuterol (PROVENTIL) (2.5 MG/3ML) 0.083% nebulizer solution USE 1 VIAL IN NEBULIZER EVERY 4 HOURS AS NEEDED  . aspirin 81 MG EC tablet Take 81 mg by mouth every morning.   . budesonide-formoterol (SYMBICORT) 160-4.5 MCG/ACT inhaler Inhale 2 puffs into the lungs 2 (two) times daily.  . famotidine (PEPCID) 20 MG tablet Take 20 mg by mouth daily as needed.   . Ibuprofen 200 MG CAPS Take by mouth as needed.  . mometasone (NASONEX) 50 MCG/ACT nasal spray Place 1 spray into the nose 2 (two) times daily.  . Polyvinyl Alcohol-Povidone (REFRESH OP) Apply to eye as needed.  Marland Kitchen Respiratory Therapy Supplies (FLUTTER) DEVI Use every 4 hours as needed for cough and congestion  .     Marland Kitchen                  Past Medical Hx ASTHMA (ICD-493.90)  --chronic severe asthma with best FEV1 2.2 L documented 01/2001 and  documented non-adherence Mainly lives off samples of inhalers and nose sprays  - HFA 75% June 15, 2009 > 90% August 02, 2009 > 100% December 27, 2009  - Off allergy vaccines x7/2010 > highly allergic skin tests per Dr Annamaria Boots > restarted vaccine November 06, 2009 > stopped  07/2013  SHOULDER PAIN, RIGHT (ICD-719.41)   HYPERTENSION, BORDERLINE (ICD-401.9)  Shingles T 8 Left 9/01  HEALTH MAINTENANCE...............................................................................................Marland KitchenWert  - Colonoscopy 12/1989,  2011  - dT 07/25/2014  - Pneumovax 3/04 and 05/2009 - Prevnar 07/25/14 -Flu declined October 17, 2008 and September 21, 2009 , declined flu shot 2015, 2017  -CPX 07/25/2014              Objective:   Physical Exam    amb stoic bm nad/ somewhat child like affect  Vital signs reviewed - Note on arrival 02 sats  95% on RA     05/23/2014  201> 201 07/04/2014 > 07/25/14  196>198 08/22/2014 >194 09/19/2014 >198 10/11/2014 > 10/24/2014  196>   01/02/2015 193 > 03/06/2015  198 >198  04/03/2015 > 05/16/2015 196 >  06/02/2015 196 >197 06/16/2015 > 12/12/2015 199> 197 01/08/2016 >   02/16/2016     194 > 04/08/2016  193> 08/06/2016    196 > 12/31/2016  191 > 01/28/2017  191  > 06/19/2017  192 > 10/24/2017   190 > 02/19/2018  192 > 06/09/2018 197> 08/11/2018    195 > 12/08/2018  192        HEENT: Poor  Dentition, mild non-specific swelling turbinates bilaterally, and oropharynx. Nl external ear canals without cough reflex   NECK :  without JVD/Nodes/TM/ nl carotid upstrokes bilaterally   LUNGS: no acc muscle use,  slightly barrel-shaped chest with distant and expiratory wheeze bilaterally without cough on insp or exp maneuvers   CV:  RRR  no s3 or murmur or increase in P2, and no edema   ABD:  soft and nontender with nl inspiratory excursion in the supine position. No bruits or organomegaly appreciated, bowel sounds nl  MS:  Nl gait/ ext warm without deformities, calf tenderness, cyanosis or clubbing No obvious joint restrictions   SKIN: warm and dry without lesions    NEURO:  alert, approp, nl sensorium with  no motor or cerebellar deficits apparent.

## 2018-12-08 NOTE — Patient Instructions (Signed)
No change in medications  See calendar for specific medication instructions and bring it back for each and every office visit for every healthcare provider you see.  Without it,  you may not receive the best quality medical care that we feel you deserve.  You will note that the calendar groups together  your maintenance  medications that are timed at particular times of the day.  Think of this as your checklist for what your doctor has instructed you to do until your next evaluation to see what benefit  there is  to staying on a consistent group of medications intended to keep you well.  The other group at the bottom is entirely up to you to use as you see fit  for specific symptoms that may arise between visits that require you to treat them on an as needed basis.  Think of this as your action plan or "what if" list.   Separating the top medications from the bottom group is fundamental to providing you adequate care going forward.     Please schedule a follow up office visit in 4 weeks, sooner if needed  with all medications /inhalers/ solutions in hand so we can verify exactly what you are taking. This includes all medications from all doctors and over the counters

## 2018-12-09 ENCOUNTER — Encounter: Payer: Self-pay | Admitting: Internal Medicine

## 2018-12-09 NOTE — Assessment & Plan Note (Addendum)
--  chronic severe asthma with best FEV1 2.2 L documented 01/2001 and documented non-adherence Mainly lives off samples of inhalers and nose sprays  - Off allergy vaccines x7/2010 > highly allergic skin tests per Dr Annamaria Boots > restart vaccine November 06, 2009>stop 07/30/13 to watch - Spirometry on "good day" 01/14/12 =  FEV1 1.54  Ratio 0.58  - 02/16/2016 added prn prednisone when needing neb - FENO 04/23/16- 32  -CBC with differential 04/23/2016-eosinophils elevated 7.090%  - Spirometry 01/28/2017  FEV1 1.23 (49%)  Ratio 60    - Spirometry 08/11/2018  FEV1 1.43 (60%)  Ratio 60    - FENO 08/11/2018  =   30 on symb 160 2bid   - 12/08/2018  After extensive coaching inhaler device,  effectiveness =    75%  (short Ti)   I continue to be very concerned about this patient's insight into his medicines and overuse of albuterol which I now found out today is entirely nebulizer inform which of course is a time stronger than the aerosol inhaler which were trying to minimize use of in the first place.  He continues to be dependent on samples and when the counts are done they are simply not adding up.  Despite all this he is not going to the emergency room or urgent cares for exacerbations historically and we have done all we can here with samples to make sure he does not run out.  In this light we do need to see him every 4 weeks to make sure he has the right medicines on hand and if he qualifies we can fill out the paperwork for patient assistance.   I reviewed with him again the goals of care for asthma including minimum use of albuterol and contingency instructions in detail as well.   I had an extended discussion with the patient reviewing all relevant studies completed to date and  lasting 15 to 20 minutes of a 25 minute visit    See device teaching which extended face to face time for this visit.    Each maintenance medication was reviewed in detail including most importantly the difference between maintenance  and as needed and under what circumstances the prns are to be used. This was done in the context of a newly generated medication calendar   which provided the patient with a user-friendly unambiguous mechanism for medication administration and reconciliation and provides an action plan for all active problems. It is critical that this be shown to every doctor  for modification during the office visit if necessary so the patient can use it as a working document.      Please see AVS for specific instructions unique to this visit that I personally wrote and verbalized to the the pt in detail and then reviewed with pt  by my nurse highlighting any  changes in therapy recommended at today's visit to their plan of care.

## 2018-12-11 DIAGNOSIS — M9901 Segmental and somatic dysfunction of cervical region: Secondary | ICD-10-CM | POA: Diagnosis not present

## 2018-12-11 DIAGNOSIS — M9903 Segmental and somatic dysfunction of lumbar region: Secondary | ICD-10-CM | POA: Diagnosis not present

## 2018-12-11 DIAGNOSIS — M545 Low back pain: Secondary | ICD-10-CM | POA: Diagnosis not present

## 2018-12-11 DIAGNOSIS — M9902 Segmental and somatic dysfunction of thoracic region: Secondary | ICD-10-CM | POA: Diagnosis not present

## 2019-01-01 DIAGNOSIS — M9902 Segmental and somatic dysfunction of thoracic region: Secondary | ICD-10-CM | POA: Diagnosis not present

## 2019-01-01 DIAGNOSIS — M545 Low back pain: Secondary | ICD-10-CM | POA: Diagnosis not present

## 2019-01-01 DIAGNOSIS — M9901 Segmental and somatic dysfunction of cervical region: Secondary | ICD-10-CM | POA: Diagnosis not present

## 2019-01-01 DIAGNOSIS — M9903 Segmental and somatic dysfunction of lumbar region: Secondary | ICD-10-CM | POA: Diagnosis not present

## 2019-01-05 ENCOUNTER — Ambulatory Visit (INDEPENDENT_AMBULATORY_CARE_PROVIDER_SITE_OTHER): Payer: Medicare Other | Admitting: Internal Medicine

## 2019-01-05 ENCOUNTER — Encounter: Payer: Self-pay | Admitting: Internal Medicine

## 2019-01-05 VITALS — BP 124/70 | HR 75 | Ht 67.5 in | Wt 189.0 lb

## 2019-01-05 DIAGNOSIS — J4550 Severe persistent asthma, uncomplicated: Secondary | ICD-10-CM | POA: Diagnosis not present

## 2019-01-05 MED ORDER — BUDESONIDE-FORMOTEROL FUMARATE 160-4.5 MCG/ACT IN AERO: 2.0000 | INHALATION_SPRAY | Freq: Two times a day (BID) | RESPIRATORY_TRACT | 0 refills | Status: DC

## 2019-01-05 NOTE — Progress Notes (Signed)
Subjective:   Patient ID: [name]    DOB: 03-11-1941     Brief patient profile: 67  yobm denies ever smoking but has lifelong asthma with severe chronic pattern and best FEV1 of around 2.2 liters documented 2002 and also documented non-adherence  followed in pulmonary for primary care also for hbp and hyperlipidemia.   History of Present Illness    07/30/13 Allergy eval rx completed x 4 years > d/c'd and f/u allergy prn    04/03/2015 Follow up : chronic asthma/ HTN/non adherence / relies on samples/ no formulary / has med calendar  Pt returns for 1 month follow up .  Doing well with no asthma flare , on Dulera . Marland Kitchen  Need to establish with dentist > multiple teeth extracted   05/06/2016 NP Follow up ; Severe chronic asthma  Patient presents for 1 month follow up and med review We reviewed all his medications organize them into a medication count with patient education Appears he is taking medications correctly. However, does depend on samples. He did not have his Pepcid or Protonix today but says he has them at home. Says overall his breathing is doing well without any flare cough or wheezing. Seen by allergy Dr. Annamaria Boots on 04/23/16   , IgE 203, elevated eos Looking into Nucala.  Prevnar and PVX utd.  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 Is able to sing without sob.  rec No change rx   06/04/16 Dr Annamaria Boots rec Nucala > could not afford deductible       08/11/2018  f/u ov/Issa Luster re: severe asthma/ sample reliant as can't afford plan d medicare  Chief Complaint  Patient presents with  . Follow-up    Pt states he has been doing well since last visit and denies any current complaints.  Dyspnea:  MMRC2 = can't walk a nl pace on a flat grade s sob but does fine slow and flat shopping ok Cough: no Sleeping: props up on 2 pillows  SABA use: no hfa on hand and can't tell me what color it is 02: none   rec Work on inhaler technique:  Only use your albuterol as a rescue  medication    12/08/2018  f/u ov/Ciarra Braddy re: severe asthma/ sample dep has 10 puffs left on sample of symbicort 160- and another one at home/ no med calendar   Chief Complaint  Patient presents with  . Follow-up    Breathing is doing well today. He uses his albuterol   2 x daily  uses neb.  Dyspnea:  MMRC 2 Cough: minimal mucoid not noct Sleeping: ok flat bed / 2 pillows SABA use: only has neb saba now  02: none Has active hb on pepcid 10 mg prn (was supposed to be on 20 bid) rec  No change rx    01/05/2019  f/u ov/Blu Mcglaun re: severe asthma/ non-adherent / last pred a week prior to OV  But symb counter way past red line  and says this time he has none at home  Chief Complaint  Patient presents with  . Follow-up    Breathing is doing well today. He rarely uses his rescue inhaler or neb.    Dyspnea:  No change MMRC2 = can't walk a nl pace on a flat grade s sob but does fine slow and flat  Cough: no Sleeping: on side bed is flat/ 2 pillows  SABA use: rarely either hfa or neb now   02: none  No obvious day to day or daytime variability or assoc excess/ purulent sputum or mucus plugs or hemoptysis or cp or chest tightness, subjective wheeze or overt sinus or hb symptoms.   Sleeping  without nocturnal  or early am exacerbation  of respiratory  c/o's or need for noct saba. Also denies any obvious fluctuation of symptoms with weather or environmental changes or other aggravating or alleviating factors except as outlined above   No unusual exposure hx or h/o childhood pna/ asthma or knowledge of premature birth.  Current Allergies, Complete Past Medical History, Past Surgical History, Family History, and Social History were reviewed in [facility] record.  ROS  The following are not active complaints unless bolded Hoarseness, sore throat, dysphagia, dental problems, itching, sneezing,  nasal congestion or discharge of excess mucus or purulent secretions, ear ache,    fever, chills, sweats, unintended wt loss or wt gain, classically pleuritic or exertional cp,  orthopnea pnd or arm/hand swelling  or leg swelling, presyncope, palpitations, abdominal pain, anorexia, nausea, vomiting, diarrhea  or change in bowel habits or change in bladder habits, change in stools or change in urine, dysuria, hematuria,  rash, arthralgias, visual complaints, headache, numbness, weakness or ataxia or problems with walking or coordination,  change in mood or  memory.        Current Meds  Medication Sig  . albuterol (PROAIR HFA) 108 (90 Base) MCG/ACT inhaler 2 puffs every 4 hours as needed for wheezing ((PLANB))  . albuterol (PROVENTIL) (2.5 MG/3ML) 0.083% nebulizer solution USE 1 VIAL IN NEBULIZER EVERY 4 HOURS AS NEEDED  . aspirin 81 MG EC tablet Take 81 mg by mouth every morning.   . budesonide-formoterol (SYMBICORT) 160-4.5 MCG/ACT inhaler Inhale 2 puffs into the lungs 2 (two) times daily.  . famotidine (PEPCID) 20 MG tablet Take 20 mg by mouth daily as needed.   . Ibuprofen 200 MG CAPS Take by mouth as needed.  . Polyvinyl Alcohol-Povidone (REFRESH OP) Apply to eye as needed.  . predniSONE (DELTASONE) 10 MG tablet 2 daily until better, then 1 x 3 days and stop  . Respiratory Therapy Supplies (FLUTTER) DEVI Use every 4 hours as needed for cough and congestion               Past Medical Hx ASTHMA (ICD-493.90)  --chronic severe asthma with best FEV1 2.2 L documented 01/2001 and  documented non-adherence Mainly lives off samples of inhalers and nose sprays  - HFA 75% June 15, 2009 > 90% August 02, 2009 > 100% December 27, 2009  - Off allergy vaccines x7/2010 > highly allergic skin tests per Dr Annamaria Boots > restarted vaccine November 06, 2009 > stopped  07/2013  SHOULDER PAIN, RIGHT (ICD-719.41)   HYPERTENSION, BORDERLINE (ICD-401.9)  Shingles T 8 Left 9/01  HEALTH MAINTENANCE...............................................................................................Marland KitchenWert  -  Colonoscopy 12/1989,  2011  - dT 07/25/2014  - Pneumovax 3/04 and 05/2009 - Prevnar 07/25/14 -Flu declined October 17, 2008 and September 21, 2009 , declined flu shot 2015, 2017  -CPX 07/25/2014              Objective:   Physical Exam    amb somber wm nad   Vital signs reviewed - Note on arrival 02 sats  95% on RA     05/23/2014  201> 201 07/04/2014 > 07/25/14  196>198 08/22/2014 >194 09/19/2014 >198 10/11/2014 > 10/24/2014  196>   01/02/2015 193 > 03/06/2015  198 >198 04/03/2015 > 05/16/2015 196 >  06/02/2015 196 >197  06/16/2015 > 12/12/2015 199> 197 01/08/2016 >   02/16/2016     194 > 04/08/2016  193> 08/06/2016    196 > 12/31/2016  191 > 01/28/2017  191  > 06/19/2017  192 > 10/24/2017   190 > 02/19/2018  192 > 06/09/2018 197> 08/11/2018    195 > 12/08/2018  192 > 01/05/2019   189       HEENT: Poor  dentition / nl oropharynx. Nl external ear canals without cough reflex -  Mild bilateral non-specific turbinate edema     NECK :  without JVD/Nodes/TM/ nl carotid upstrokes bilaterally   LUNGS: no acc muscle use,  Mild barrel  contour chest wall with bilateral  With mid/ late distant  wheeze and  without cough on insp or exp maneuver and mild  Hyperresonant  to  percussion bilaterally     CV:  RRR  no s3 or murmur or increase in P2, and no edema   ABD:  soft and nontender with pos late  insp Hoover's  in the supine position. No bruits or organomegaly appreciated, bowel sounds nl  MS:   Nl gait/  ext warm without deformities, calf tenderness, cyanosis or clubbing No obvious joint restrictions   SKIN: warm and dry without lesions    NEURO:  alert, approp, nl sensorium with  no motor or cerebellar deficits apparent.

## 2019-01-05 NOTE — Patient Instructions (Signed)
Work on inhaler technique:  relax and gently blow all the way out then take a nice smooth deep breath back in, triggering the inhaler at same time you start breathing in.  Hold for up to 5 seconds if you can. Blow out thru nose. Rinse and gargle with water when done      Please schedule a follow up office visit in 4 weeks, sooner if needed  with all medications /inhalers/ solutions in hand so we can verify exactly what you are taking. This includes all medications from all doctors and over the counters

## 2019-01-05 NOTE — Assessment & Plan Note (Addendum)
--  chronic severe asthma with best FEV1 2.2 L documented 01/2001 and documented non-adherence Mainly lives off samples of inhalers and nose sprays  - Off allergy vaccines x7/2010 > highly allergic skin tests per Dr Annamaria Boots > restart vaccine November 06, 2009>stop 07/30/13 to watch - Spirometry on "good day" 01/14/12 =  FEV1 1.54  Ratio 0.58  - 02/16/2016 added prn prednisone when needing neb - FENO 04/23/16- 32  -CBC with differential 04/23/2016-eosinophils elevated 7.090%  - Spirometry 01/28/2017  FEV1 1.23 (49%)  Ratio 60    - Spirometry 08/11/2018  FEV1 1.43 (60%)  Ratio 60  - FENO 08/11/2018  =   30 on symb 160 2bid   - 01/05/2019  After extensive coaching inhaler device,  effectiveness =    75% (short Ti) from a baseline of 50 %      Continues to struggle with how to use and keep up with maint vs prns but surprisingly able to stay out of er on samples of ICS/laba and prn prednisone as cannot afford to purchase any of his maint rx   I had an extended discussion with the patient reviewing all relevant studies completed to date and  lasting 15 to 20 minutes of a 25 minute visit    See device teaching which extended face to face time for this visit.  Each maintenance medication was reviewed in detail including emphasizing most importantly the difference between maintenance and prns and under what circumstances the prns are to be triggered using an action plan format that is not reflected in the computer generated alphabetically organized AVS which I have not found useful in most complex patients, especially with respiratory illnesses  Please see AVS for specific instructions unique to this visit that I personally wrote and verbalized to the the pt in detail and then reviewed with pt  by my nurse highlighting any  changes in therapy recommended at today's visit to their plan of care.

## 2019-01-27 ENCOUNTER — Other Ambulatory Visit: Payer: Self-pay | Admitting: Chiropractor

## 2019-01-27 DIAGNOSIS — M25531 Pain in right wrist: Secondary | ICD-10-CM

## 2019-01-27 DIAGNOSIS — M25532 Pain in left wrist: Principal | ICD-10-CM

## 2019-02-02 ENCOUNTER — Ambulatory Visit: Payer: Medicare Other | Admitting: Internal Medicine

## 2019-02-04 ENCOUNTER — Ambulatory Visit: Payer: Medicare Other | Admitting: Internal Medicine

## 2019-02-05 ENCOUNTER — Ambulatory Visit
Admission: RE | Admit: 2019-02-05 | Discharge: 2019-02-05 | Disposition: A | Discharge status: Home / Self Care | Payer: Medicare Other | Source: Ambulatory Visit | Attending: Chiropractor | Admitting: Chiropractor

## 2019-02-05 DIAGNOSIS — M25531 Pain in right wrist: Secondary | ICD-10-CM

## 2019-02-05 DIAGNOSIS — S6991XA Unspecified injury of right wrist, hand and finger(s), initial encounter: Secondary | ICD-10-CM | POA: Diagnosis not present

## 2019-02-05 DIAGNOSIS — M25532 Pain in left wrist: Principal | ICD-10-CM

## 2019-02-05 DIAGNOSIS — S6992XA Unspecified injury of left wrist, hand and finger(s), initial encounter: Secondary | ICD-10-CM | POA: Diagnosis not present

## 2019-02-23 ENCOUNTER — Encounter: Payer: Self-pay | Admitting: Internal Medicine

## 2019-02-23 ENCOUNTER — Ambulatory Visit (INDEPENDENT_AMBULATORY_CARE_PROVIDER_SITE_OTHER): Payer: Medicare Other | Admitting: Internal Medicine

## 2019-02-23 VITALS — BP 140/82 | HR 95 | Ht 66.0 in | Wt 190.0 lb

## 2019-02-23 DIAGNOSIS — J4550 Severe persistent asthma, uncomplicated: Secondary | ICD-10-CM

## 2019-02-23 DIAGNOSIS — J302 Other seasonal allergic rhinitis: Secondary | ICD-10-CM

## 2019-02-23 DIAGNOSIS — I10 Essential (primary) hypertension: Secondary | ICD-10-CM | POA: Diagnosis not present

## 2019-02-23 DIAGNOSIS — J3089 Other allergic rhinitis: Secondary | ICD-10-CM

## 2019-02-23 NOTE — Patient Instructions (Addendum)
Work on inhaler technique:  relax and gently blow all the way out then take a nice smooth deep breath back in, triggering the inhaler at same time you start breathing in.  Hold for up to 5 seconds if you can. Blow out thru nose. Rinse and gargle with water when done     Please schedule a follow up office visit in 4 weeks, sooner if needed  - see [name] with all you medications so she can update you med calendar  Add:  Find out who is doing primary care now and if no one they refer him to [facility] and check cbc with diff/ bmet on return as nothing in records

## 2019-02-23 NOTE — Progress Notes (Signed)
Subjective:   Patient ID: [name]    DOB: [birth year]     Brief patient profile: 37  yobm denies ever smoking but has lifelong asthma with severe chronic pattern and best FEV1 of around 2.2 liters documented 2002 and also documented non-adherence  followed in pulmonary for primary care also for hbp and hyperlipidemia.   History of Present Illness    07/30/13 Allergy eval rx completed x 4 years > d/c'd and f/u allergy prn    04/03/2015 Follow up : chronic asthma/ HTN/non adherence / relies on samples/ no formulary / has med calendar  Pt returns for 1 month follow up .  Doing well with no asthma flare , on Dulera . Marland Kitchen  Need to establish with dentist > multiple teeth extracted   05/06/2016 NP Follow up ; Severe chronic asthma  Patient presents for 1 month follow up and med review We reviewed all his medications organize them into a medication count with patient education Appears he is taking medications correctly. However, does depend on samples. He did not have his Pepcid or Protonix today but says he has them at home. Says overall his breathing is doing well without any flare cough or wheezing. Seen by allergy Dr. Annamaria Boots on 04/23/16   , IgE 203, elevated eos Looking into Nucala.  Prevnar and PVX utd.  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 Is able to sing without sob.  rec No change rx   06/04/16 Dr Annamaria Boots rec Nucala > could not afford deductible       08/11/2018  f/u ov/Binh Doten re: severe asthma/ sample reliant as can't afford plan d medicare  Chief Complaint  Patient presents with  . Follow-up    Pt states he has been doing well since last visit and denies any current complaints.  Dyspnea:  MMRC2 = can't walk a nl pace on a flat grade s sob but does fine slow and flat shopping ok Cough: no Sleeping: props up on 2 pillows  SABA use: no hfa on hand and can't tell me what color it is 02: none   rec Work on inhaler technique:  Only use your albuterol as a rescue  medication    12/08/2018  f/u ov/Jadee Golebiewski re: severe asthma/ sample dep has 10 puffs left on sample of symbicort 160- and another one at home/ no med calendar   Chief Complaint  Patient presents with  . Follow-up    Breathing is doing well today. He uses his albuterol   2 x daily  uses neb.  Dyspnea:  MMRC 2 Cough: minimal mucoid not noct Sleeping: ok flat bed / 2 pillows SABA use: only has neb saba now  02: none Has active hb on pepcid 10 mg prn (was supposed to be on 20 bid) rec  No change rx    01/05/2019  f/u ov/Hawa Henly re: severe asthma/ non-adherent / last pred a week prior to OV  But symb counter way past red line  and says this time he has none at home      02/23/2019  f/u ov/Tianni Escamilla re: severe asthma/ non adherent / brought med calendar and all meds but they don't all match up  Chief Complaint  Patient presents with  . Follow-up  Dyspnea:  MMRC2 = can't walk a nl pace on a flat grade s sob but does fine slow and flat  Cough: no Sleeping: bed flat, on side/ 1-2 pillows  SABA use: rarely / very seldom  neb  02: no   No obvious day to day or daytime variability or assoc excess/ purulent sputum or mucus plugs or hemoptysis or cp or chest tightness, subjective wheeze or overt sinus or hb symptoms.   Sleeping most nights  without nocturnal  or early am exacerbation  of respiratory  c/o's or need for noct saba. Also denies any obvious fluctuation of symptoms with weather or environmental changes or other aggravating or alleviating factors except as outlined above   No unusual exposure hx or h/o childhood pna or knowledge of premature birth.  Current Allergies, Complete Past Medical History, Past Surgical History, Family History, and Social History were reviewed in [employer] [facility] record.  ROS  The following are not active complaints unless bolded Hoarseness, sore throat, dysphagia, dental problems, itching, sneezing,  nasal congestion or discharge of excess mucus or  purulent secretions, ear ache,   fever, chills, sweats, unintended wt loss or wt gain, classically pleuritic or exertional cp,  orthopnea pnd or arm/hand swelling  or leg swelling, presyncope, palpitations, abdominal pain, anorexia, nausea, vomiting, diarrhea  or change in bowel habits or change in bladder habits, change in stools or change in urine, dysuria, hematuria,  rash, arthralgias, visual complaints, headache, numbness, weakness or ataxia or problems with walking or coordination,  change in mood or  memory.        Current Meds -  - NOTE:   Unable to verify as accurately reflecting what pt takes     Medication Sig  . albuterol (PROAIR HFA) 108 (90 Base) MCG/ACT inhaler 2 puffs every 4 hours as needed for wheezing ((PLANB))  . albuterol (PROVENTIL) (2.5 MG/3ML) 0.083% nebulizer solution USE 1 VIAL IN NEBULIZER EVERY 4 HOURS AS NEEDED  . aspirin 81 MG EC tablet Take 81 mg by mouth every morning.   . budesonide-formoterol (SYMBICORT) 160-4.5 MCG/ACT inhaler Inhale 2 puffs into the lungs 2 (two) times daily for 30 days.  Marland Kitchen guaifenesin (HUMIBID E) 400 MG TABS tablet Take 400 mg by mouth every 4 (four) hours.  . Ibuprofen 200 MG CAPS Take by mouth as needed.  Marland Kitchen oxymetazoline (AFRIN) 0.05 % nasal spray Place 1 spray into both nostrils 2 (two) times daily as needed for congestion.  . Polyvinyl Alcohol-Povidone (REFRESH OP) Apply to eye as needed.  . predniSONE (DELTASONE) 10 MG tablet 2 daily until better, then 1 x 3 days and stop  . Respiratory Therapy Supplies (FLUTTER) DEVI Use every 4 hours as needed for cough and congestion                    Past Medical Hx ASTHMA (ICD-493.90)  --chronic severe asthma with best FEV1 2.2 L documented 01/2001 and  documented non-adherence Mainly lives off samples of inhalers and nose sprays  - HFA 75% June 15, 2009 > 90% August 02, 2009 > 100% December 27, 2009  - Off allergy vaccines x7/2010 > highly allergic skin tests per Dr Annamaria Boots > restarted  vaccine November 06, 2009 > stopped  07/2013  SHOULDER PAIN, RIGHT (ICD-719.41)   HYPERTENSION, BORDERLINE (ICD-401.9)  Shingles T 8 Left 9/01  HEALTH MAINTENANCE...............................................................................................Marland KitchenWert  - Colonoscopy 12/1989,  2011  - dT 07/25/2014  - Pneumovax 3/04 and 05/2009 - Prevnar 07/25/14 -Flu declined October 17, 2008 and September 21, 2009 , declined flu shot 2015, 2017  -CPX 07/25/2014              Objective:   Physical Exam    amb somber  bm nad   Vital signs reviewed - Note on arrival 02 sats  93% on RA     05/23/2014  201> 201 07/04/2014 > 07/25/14  196>198 08/22/2014 >194 09/19/2014 >198 10/11/2014 > 10/24/2014  196>   01/02/2015 193 > 03/06/2015  198 >198 04/03/2015 > 05/16/2015 196 >  06/02/2015 196 >197 06/16/2015 > 12/12/2015 199> 197 01/08/2016 >   02/16/2016     194 > 04/08/2016  193> 08/06/2016    196 > 12/31/2016  191 > 01/28/2017  191  > 06/19/2017  192 > 10/24/2017   190 > 02/19/2018  192 > 06/09/2018 197> 08/11/2018    195 > 12/08/2018  192 > 01/05/2019   189 > 02/23/2019   190     HEENT: nl dentition / oropharynx. Nl external ear canals without cough reflex -  Mild bilateral non-specific turbinate edema     NECK :  without JVD/Nodes/TM/ nl carotid upstrokes bilaterally   LUNGS: no acc muscle use,  Mild barrel  contour chest wall with bilateral  Distant bs s audible wheeze and  without cough on insp or exp maneuver and mild  Hyperresonant  to  percussion bilaterally     CV:  RRR  no s3 or murmur or increase in P2, and no edema   ABD:  soft and nontender with pos mid/late insp Hoover's  in the supine position. No bruits or organomegaly appreciated, bowel sounds nl  MS:   Nl gait/  ext warm without deformities, calf tenderness, cyanosis or clubbing No obvious joint restrictions   SKIN: warm and dry without lesions    NEURO:  alert, approp, nl sensorium with  no motor or cerebellar deficits apparent.               I  personally reviewed images and agree with radiology impression as follows:   Chest CT 01/27/19 No acute findings. 2. Apparent scarring and/or atelectasis at the lung bases with old granulomatous disease and normal variant of eventration of the hemidiaphragms. 3. Partially visualized low-density of the lateral midpole of the right kidney with overlying focal cortical thinning possibly representing scarring.

## 2019-02-24 ENCOUNTER — Encounter: Payer: Self-pay | Admitting: Internal Medicine

## 2019-02-24 NOTE — Assessment & Plan Note (Signed)
Adequate control on present rx, reviewed in detail with pt > no change in rx needed  = diet but need to sort out who is PCP and check at least a cbc with diff and bmet if not being done elsewhere

## 2019-02-24 NOTE — Assessment & Plan Note (Addendum)
Onset in childhood  --chronic severe asthma with best FEV1 2.2 L documented 01/2001 and documented non-adherence Mainly lives off samples of inhalers and nose sprays  - Off allergy vaccines x7/2010 > highly allergic skin tests per Dr Annamaria Boots > restart vaccine November 06, 2009>stop 07/30/13 to watch - Spirometry on "good day" 01/14/12 =  FEV1 1.54  Ratio 0.58  - 02/16/2016 added prn prednisone when needing neb - FENO 04/23/16- 32  -CBC with differential 04/23/2016-eosinophils elevated 7%  - Spirometry 01/28/2017  FEV1 1.23 (49%)  Ratio 60    - Spirometry 08/11/2018  FEV1 1.43 (60%)  Ratio 60  - FENO 08/11/2018  =   30 on symb 160 2bid  - 02/23/2019  After extensive coaching inhaler device,  effectiveness =    75% (short Ti)   Very challenging taking care of this patient who has such limited insight into his medicines and his symptoms after years of poorly controlled asthma and little motivation to change things at this point.  The best we can do is just try to keep up with his refills for him and give him samples whenever possible.  He continues to have prednisone available as plan  D as per his medication calendar.   Next visit we should do full medication reconciliation.  To keep things simple, I have asked the patient to first separate medicines that are perceived as maintenance, that is to be taken daily "no matter what", from those medicines that are taken on only on an as-needed basis and I have given the patient examples of both, and then return to see our NP to generate a  detailed  medication calendar which should be followed until the next physician sees the patient and updates it.     I had an extended discussion with the patient reviewing all relevant studies completed to date and  lasting 15 to 20 minutes of a 25 minute visit    See device teaching which extended face to face time for this visit   Each maintenance medication was reviewed in detail including most importantly the difference  between maintenance and prns and under what circumstances the prns are to be triggered using an action plan format that is not reflected in the computer generated alphabetically organized AVS but trather by a customized med calendar that reflects the AVS meds with confirmed 100% correlation.   In addition, Please see AVS for unique instructions that I personally wrote and verbalized to the the pt in detail and then reviewed with pt  by my nurse highlighting any  changes in therapy recommended at today's visit to their plan of care.

## 2019-02-24 NOTE — Assessment & Plan Note (Signed)
Off allergy vaccines x7/2010 > highly allergic skin tests per Dr Annamaria Boots > restarted vaccine November 06, 2009  Allergy vaccine 1:50:000 10/30/09; adv to 1:10 11/20/10 GH. D/Cd 07/30/13 - allergy profile 12/12/15 >  Eos 0.3 /   IgE  243 multiple pos RAST with dog the worst  - 07/2016 approved for nucala  But could not afford deductible   Presently using vasoconstrictive agents without nasal steroids.  He is therefore at risk of rebound and reviewed with him the strategy only using Afrin immediately  prior to nasal steroid application (to help them reach the target tissue)  and only for 5 days and then off.

## 2019-03-10 DIAGNOSIS — M25531 Pain in right wrist: Secondary | ICD-10-CM | POA: Insufficient documentation

## 2019-03-23 ENCOUNTER — Emergency Department (HOSPITAL_COMMUNITY)
Admission: EM | Admit: 2019-03-23 | Discharge: 2019-03-23 | Disposition: A | Discharge status: Home / Self Care | Payer: Medicare Other | Attending: Emergency Medicine | Admitting: Emergency Medicine

## 2019-03-23 ENCOUNTER — Emergency Department (HOSPITAL_COMMUNITY): Payer: Medicare Other

## 2019-03-23 ENCOUNTER — Ambulatory Visit: Payer: Medicare Other | Admitting: Internal Medicine

## 2019-03-23 ENCOUNTER — Other Ambulatory Visit: Payer: Self-pay

## 2019-03-23 ENCOUNTER — Encounter (HOSPITAL_COMMUNITY): Payer: Self-pay | Admitting: Student

## 2019-03-23 DIAGNOSIS — R0902 Hypoxemia: Secondary | ICD-10-CM | POA: Diagnosis not present

## 2019-03-23 DIAGNOSIS — I6782 Cerebral ischemia: Secondary | ICD-10-CM | POA: Diagnosis not present

## 2019-03-23 DIAGNOSIS — I129 Hypertensive chronic kidney disease with stage 1 through stage 4 chronic kidney disease, or unspecified chronic kidney disease: Secondary | ICD-10-CM | POA: Insufficient documentation

## 2019-03-23 DIAGNOSIS — R42 Dizziness and giddiness: Secondary | ICD-10-CM | POA: Diagnosis not present

## 2019-03-23 DIAGNOSIS — Z7982 Long term (current) use of aspirin: Secondary | ICD-10-CM | POA: Insufficient documentation

## 2019-03-23 DIAGNOSIS — R110 Nausea: Secondary | ICD-10-CM | POA: Diagnosis not present

## 2019-03-23 DIAGNOSIS — Z79899 Other long term (current) drug therapy: Secondary | ICD-10-CM | POA: Insufficient documentation

## 2019-03-23 DIAGNOSIS — N183 Chronic kidney disease, stage 3 (moderate): Secondary | ICD-10-CM | POA: Insufficient documentation

## 2019-03-23 DIAGNOSIS — J45909 Unspecified asthma, uncomplicated: Secondary | ICD-10-CM | POA: Insufficient documentation

## 2019-03-23 DIAGNOSIS — I10 Essential (primary) hypertension: Secondary | ICD-10-CM | POA: Diagnosis not present

## 2019-03-23 LAB — URINALYSIS, ROUTINE W REFLEX MICROSCOPIC
Bilirubin Urine: NEGATIVE
GLUCOSE, UA: NEGATIVE mg/dL
Hgb urine dipstick: NEGATIVE
Ketones, ur: NEGATIVE mg/dL
Leukocytes,Ua: NEGATIVE
Nitrite: NEGATIVE
PH: 6 (ref 5.0–8.0)
Protein, ur: NEGATIVE mg/dL
Specific Gravity, Urine: 1.011 (ref 1.005–1.030)

## 2019-03-23 LAB — BASIC METABOLIC PANEL
Anion gap: 8 (ref 5–15)
BUN: 18 mg/dL (ref 8–23)
CO2: 24 mmol/L (ref 22–32)
CREATININE: 1.33 mg/dL — AB (ref 0.61–1.24)
Calcium: 8.9 mg/dL (ref 8.9–10.3)
Chloride: 108 mmol/L (ref 98–111)
GFR calc Af Amer: 59 mL/min — ABNORMAL LOW (ref >60)
GFR calc non Af Amer: 51 mL/min — ABNORMAL LOW (ref >60)
Glucose, Bld: 129 mg/dL — ABNORMAL HIGH (ref 70–99)
Potassium: 3.9 mmol/L (ref 3.5–5.1)
Sodium: 140 mmol/L (ref 135–145)

## 2019-03-23 LAB — CBC WITH DIFFERENTIAL/PLATELET
Abs Immature Granulocytes: 0.14 10*3/uL — ABNORMAL HIGH (ref 0.00–0.07)
Basophils Absolute: 0.1 10*3/uL (ref 0.0–0.1)
Basophils Relative: 1 %
EOS ABS: 0.5 10*3/uL (ref 0.0–0.5)
Eosinophils Relative: 5 %
HEMATOCRIT: 44.1 % (ref 39.0–52.0)
Hemoglobin: 14.3 g/dL (ref 13.0–17.0)
Immature Granulocytes: 1 %
LYMPHS ABS: 3.8 10*3/uL (ref 0.7–4.0)
Lymphocytes Relative: 36 %
MCH: 29.7 pg (ref 26.0–34.0)
MCHC: 32.4 g/dL (ref 30.0–36.0)
MCV: 91.5 fL (ref 80.0–100.0)
Monocytes Absolute: 0.9 10*3/uL (ref 0.1–1.0)
Monocytes Relative: 8 %
NEUTROS PCT: 49 %
Neutro Abs: 5.1 10*3/uL (ref 1.7–7.7)
Platelets: 318 10*3/uL (ref 150–400)
RBC: 4.82 MIL/uL (ref 4.22–5.81)
RDW: 13.1 % (ref 11.5–15.5)
WBC: 10.3 10*3/uL (ref 4.0–10.5)
nRBC: 0 % (ref 0.0–0.2)

## 2019-03-23 MED ORDER — ONDANSETRON HCL 4 MG/2ML IJ SOLN
4.0000 mg | Freq: Once | INTRAMUSCULAR | Status: AC
  Administered 2019-03-23: 4 mg via INTRAVENOUS
  Filled 2019-03-23: qty 2

## 2019-03-23 MED ORDER — ONDANSETRON 4 MG PO TBDP: 4.0000 mg | ORAL_TABLET | Freq: Three times a day (TID) | ORAL | 0 refills | Status: DC | PRN

## 2019-03-23 MED ORDER — MECLIZINE HCL 25 MG PO TABS
25.0000 mg | ORAL_TABLET | Freq: Once | ORAL | Status: AC
  Administered 2019-03-23: 25 mg via ORAL
  Filled 2019-03-23: qty 1

## 2019-03-23 MED ORDER — MECLIZINE HCL 12.5 MG PO TABS: 12.5000 mg | ORAL_TABLET | Freq: Three times a day (TID) | ORAL | 0 refills | Status: DC | PRN

## 2019-03-23 NOTE — Discharge Instructions (Addendum)
You are seen in the emergency department today for dizziness.  Your MRI did not show any acute abnormality, did show some progressive changes to the blood vessels in your brain.  Your labs appeared similar to prior labs you have had done.  Your EKG was normal.  Send you home with Zofran and meclizine to take as needed for nausea as well as dizziness. The meclizine is for dizziness, the zofran is for nausea.   We have prescribed you new medication(s) today. Discuss the medications prescribed today with your pharmacist as they can have adverse effects and interactions with your other medicines including over the counter and prescribed medications. Seek medical evaluation if you start to experience new or abnormal symptoms after taking one of these medicines, seek care immediately if you start to experience difficulty breathing, feeling of your throat closing, facial swelling, or rash as these could be indications of a more serious allergic reaction   Please be sure to remain well-hydrated.  Please follow-up with primary care within the next 3 days.  Return to the ER for new or worsening symptoms including but not limited to persistent dizziness, falling, passing out, chest pain, trouble breathing, inability to keep fluids down, or any other concerns.

## 2019-03-23 NOTE — ED Notes (Addendum)
Patient stated he was not dizzy while I was doing the orthostatic but he said he became dizzy again once he laid back in the bed. Felt like the room was spinning. Spit in emesis bag once between the sitting and standing orthostatic vital signs.

## 2019-03-23 NOTE — ED Provider Notes (Signed)
[facility] DEPT Provider Note   CSN: [id number] Arrival date & time: 03/23/19  0602    History   Chief Complaint Chief Complaint  Patient presents with  . Dizziness    HPI [name] is a 78 y.o. male with a hx of asthma, HTN, GERD, hyperlipidemia, and CKD stage III who presents to the ED with complaints of dizziness/lightheadedness that began at 0400 this AM. Patient states that he woke from sleep to use the bathroom, after urinating he was walking back to bed when he began to feel lightheaded/dizzy as if he may pass out. He stopped and sat down in a recliner and this seemed to resolve, however upon attempting to stand again sxs return. He reports improvement with sitting/remaning still, worse with position changes. Associated w/ nausea, no emesis. Denies syncope, fever, chills, change in vision, numbness, weakness, slurred speech, facial asymmetric, headache, abdominal pain, chest pain, or dyspnea.      HPI  Past Medical History:  Diagnosis Date  . Acid reflux   . Asthma 06/18/2011  . Borderline hypertension   . Cataract   . Colon polyps 2011   1 Hyperplastic and 1 Tubular Adenoma   . Dysphagia   . Esophageal stricture   . GERD (gastroesophageal reflux disease)   . Hx of adenomatous colonic polyps 06/25/2015  . Personal history of colonic polyps 12/1989  . Shingles   . Shoulder pain, right   . Unspecified asthma(493.90)   . Urinary frequency     Patient Active Problem List   Diagnosis Date Noted  . Chronic kidney disease, stage III (moderate) ([place]) 06/22/2017  . CAP (community acquired pneumonia) 03/14/2016  . Hx of adenomatous colonic polyps 06/25/2015  . Esophageal stricture   . Localized swelling of lower leg 09/19/2014  . Acute upper respiratory infection 11/22/2013  . Essential hypertension, benign 09/05/2011  . Hyperlipidemia 08/08/2011  . Healthcare maintenance 04/04/2011  . History of shingles 04/04/2011  . Esophageal  dysphagia 03/12/2011  . GERD 11/06/2009  . Seasonal and perennial allergic rhinitis 05/31/2009  . Severe persistent asthma 01/04/2008    Past Surgical History:  Procedure Laterality Date  . BALLOON DILATION N/A 02/07/2015   Procedure: BALLOON DILATION;  Surgeon: Gatha Mayer, MD;  Location: [facility];  Service: Endoscopy;  Laterality: N/A;  . ESOPHAGOGASTRODUODENOSCOPY (EGD) WITH PROPOFOL N/A 02/07/2015   Procedure: ESOPHAGOGASTRODUODENOSCOPY (EGD) WITH PROPOFOL;  Surgeon: Gatha Mayer, MD;  Location: [facility];  Service: Endoscopy;  Laterality: N/A;  . HEMORRHOID SURGERY    . HERNIA REPAIR    . MASS EXCISION N/A 08/14/2016   Procedure: EXCISION SEBACEOUS CYST BACK;  Surgeon: Johnathan Hausen, MD;  Location: [facility];  Service: General;  Laterality: N/A;  EXCISION SEBACEOUS CYST BACK  . TONSILLECTOMY          Home Medications    Prior to Admission medications   Medication Sig Start Date End Date Taking? Authorizing Provider  albuterol (PROAIR HFA) 108 (90 Base) MCG/ACT inhaler 2 puffs every 4 hours as needed for wheezing ((PLANB)) 12/08/18   Tanda Rockers, MD  albuterol (PROVENTIL) (2.5 MG/3ML) 0.083% nebulizer solution USE 1 VIAL IN NEBULIZER EVERY 4 HOURS AS NEEDED 11/21/17   Parrett, Fonnie Mu, NP  aspirin 81 MG EC tablet Take 81 mg by mouth every morning.     [provider]  budesonide-formoterol (SYMBICORT) 160-4.5 MCG/ACT inhaler Inhale 2 puffs into the lungs 2 (two) times daily for 30 days. 01/05/19 02/23/19  Wert,  Christena Deem, MD  famotidine (PEPCID) 20 MG tablet Take 20 mg by mouth daily as needed.     [provider]  guaifenesin (HUMIBID E) 400 MG TABS tablet Take 400 mg by mouth every 4 (four) hours.    [provider]  Ibuprofen 200 MG CAPS Take by mouth as needed.    [provider]  oxymetazoline (AFRIN) 0.05 % nasal spray Place 1 spray into both nostrils 2 (two) times daily as needed for congestion.    [provider]  Polyvinyl Alcohol-Povidone (REFRESH OP) Apply to eye as needed.    [provider]  predniSONE (DELTASONE) 10 MG tablet 2 daily until better, then 1 x 3 days and stop 12/08/18   Tanda Rockers, MD  Respiratory Therapy Supplies (FLUTTER) DEVI Use every 4 hours as needed for cough and congestion    [provider]    Family History Family History  Problem Relation Age of Onset  . Heart disease Mother   . Kidney failure Sister   . Breast cancer Sister   . Lung cancer Brother   . Brain cancer Sister   . Asthma Sister   . Colon cancer Neg Hx     Social History Social History   Tobacco Use  . Smoking status: Never Smoker  . Smokeless tobacco: Never Used  Substance Use Topics  . Alcohol use: No  . Drug use: No     Allergies   Patient has no known allergies.   Review of Systems Review of Systems  Constitutional: Negative for chills and fever.  HENT: Negative for congestion and ear pain.   Respiratory: Negative for cough and shortness of breath.   Cardiovascular: Negative for chest pain.  Gastrointestinal: Positive for nausea. Negative for abdominal pain, constipation, diarrhea and vomiting.  Neurological: Positive for dizziness and light-headedness. Negative for seizures, syncope, facial asymmetry, speech difficulty, weakness, numbness and headaches.  All other systems reviewed and are negative.    Physical Exam Updated Vital Signs BP (!) 163/97 (BP Location: Left Arm)   Pulse 79   Temp 97.6 F (36.4 C) (Oral)   Resp 18   Ht 5\' 7"  (1.702 m)   Wt 81.6 kg   SpO2 95%   BMI 28.19 kg/m   Physical Exam Vitals signs and nursing note reviewed.  Constitutional:      General: He is not in acute distress.    Appearance: He is well-developed. He is not toxic-appearing.  HENT:     Head: Normocephalic and atraumatic.     Right Ear: Tympanic membrane is not perforated, erythematous, retracted or bulging.     Left Ear: Tympanic membrane is  not perforated, erythematous, retracted or bulging.  Eyes:     General:        Right eye: No discharge.        Left eye: No discharge.     Extraocular Movements: Extraocular movements intact.     Conjunctiva/sclera: Conjunctivae normal.     Comments: PERRL. Arcus senilis noted.   Neck:     Musculoskeletal: Neck supple.  Cardiovascular:     Rate and Rhythm: Normal rate and regular rhythm.  Pulmonary:     Effort: Pulmonary effort is normal. No respiratory distress.     Breath sounds: Normal breath sounds. No wheezing, rhonchi or rales.  Abdominal:     General: There is no distension.     Palpations: Abdomen is soft.     Tenderness: There is no abdominal tenderness.  There is no guarding or rebound.  Skin:    General: Skin is warm and dry.     Findings: No rash.  Neurological:     Mental Status: He is alert.     Comments: Alert. Clear speech. No facial droop. CNIII-XII grossly intact. Bilateral upper and lower extremities' sensation grossly intact. 5/5 symmetric strength with grip strength and with plantar and dorsi flexion bilaterally. Normal finger to nose bilaterally. Negative pronator drift. Negative Romberg sign. Gait is steady and intact.    Psychiatric:        Behavior: Behavior normal.      ED Treatments / Results  Labs (all labs ordered are listed, but only abnormal results are displayed) Labs Reviewed  BASIC METABOLIC PANEL - Abnormal; Notable for the following components:      Result Value   Glucose, Bld 129 (*)    Creatinine, Ser 1.33 (*)    GFR calc non Af Amer 51 (*)    GFR calc Af Amer 59 (*)    All other components within normal limits  CBC WITH DIFFERENTIAL/PLATELET - Abnormal; Notable for the following components:   Abs Immature Granulocytes 0.14 (*)    All other components within normal limits  URINALYSIS, ROUTINE W REFLEX MICROSCOPIC    EKG EKG Interpretation  Date/Time:  Tuesday March 23 2019 06:10:45 EDT Ventricular Rate:  83 PR Interval:     QRS Duration: 88 QT Interval:  390 QTC Calculation: 459 R Axis:   41 Text Interpretation:  Sinus rhythm Normal ECG Confirmed by Molpus, John [phone]) on 03/23/2019 6:21:20 AM Also confirmed by Shanon Rosser [phone]), editor Philomena Doheny [phone])  on 03/23/2019 6:58:58 AM   Radiology Mr Brain Wo Contrast (neuro Protocol)  Result Date: 03/23/2019 CLINICAL DATA:  Dizziness and nausea. EXAM: MRI HEAD WITHOUT CONTRAST TECHNIQUE: Multiplanar, multiecho pulse sequences of the brain and surrounding structures were obtained without intravenous contrast. COMPARISON:  05/30/2015 FINDINGS: Brain: There is no evidence of acute infarct, intracranial hemorrhage, mass, midline shift, or extra-axial fluid collection. Mild cerebral atrophy is within normal limits for age. Scattered small foci of T2 hyperintensity in the cerebral white matter similar to the prior MRI and nonspecific but compatible with minimal chronic small vessel ischemic disease. The cerebellum is unremarkable. Vascular: Major intracranial vascular flow voids are preserved. Skull and upper cervical spine: Unremarkable bone marrow signal. Sinuses/Orbits: Bilateral cataract extraction. Paranasal sinuses and mastoid air cells are clear. Other: None. IMPRESSION: 1. No acute intracranial abnormality. 2. Minimal chronic small vessel ischemic disease. Electronically Signed   By: Logan Bores M.D.   On: 03/23/2019 11:18    Procedures Procedures (including critical care time)  Medications Ordered in ED Medications - No data to display   Initial Impression / Assessment and Plan / ED Course  I have reviewed the triage vital signs and the nursing notes.  Pertinent labs & imaging results that were available during my care of the patient were reviewed by me and considered in my medical decision making (see chart for details).   Patient presents to the ED with lightheadedness/dizziness that seems worse with position changes. Nontoxic appearing, no apparent  distress, vitals WNL other than elevated BP- low suspicion for HTN emergency. Patient has a benign physical exam, no focal neurologic deficits. He was able to ambulate without dizziness on my exam, but does note dizziness sensation when changing positions on stretcher. Plan for EKG, labs, & trial of meclizine. Patient seen by Dr. Florina Ou in agreement with plan, ambulatory without  focal deficits, if does not have symptomatic improvement will likely require MRI.   Work-up reviewed:  CBC: No leukocytosis or anemia.  BMP: Renal function @ baseline. No significant electrolyte disturbance.  UA: Negative, no dehydration/UTI.  EKG: Sinus rhythm. No STEMI or arrhythmia.   No chest pain/dyspnea, lightheadedness/dizziness w/ position changes, feel this is less likely cardiopulmonary in etiology such as ACS or PE, cardiac monitor unremarkable, EKG without ischemic changes. Orthostatic vitals were negative in terms of BP dropping, but patient did have sxs throughout process. Not classic for vertigo- will trial meclizine.   08:45: RE-EVAL: Patient states prior to receiving meclizine he had 1 episode of emesis which he feels helped. He thinks his symptoms are related to a bad sandwich he ate last night. He has not abdominal pain. Non tender abdomen without peritoneal signs. The meclizine has also seemed to help. When sitting still he is not having any symtpoms. I assisted patient to side of the bed and upon movement he began to feel a bit "whoozy" again, remains ambulatory without appreciable unsteadiness, but given continued sxs will proceed w/ MRI.   MRI negative for acute intracranial abnormality.   12:05: RE-EVAL: Patient feeling much better. Able to transition unassisted from stretcher and ambulate without difficulty, denies dizziness, steady gait. Tolerating PO.   Will discharge home with zofran & meclizine with PCP follow up & strict return precautions. I discussed results, treatment plan, need for follow-up,  and return precautions with the patient. Provided opportunity for questions, patient confirmed understanding and is in agreement with plan.   This is a shared visit with supervising physician Dr. Florina Ou who has independently evaluated patient & provided guidance in evaluation/management/disposition, in agreement with care   Vitals:   03/23/19 1024 03/23/19 1207  BP: (!) 152/90 139/86  Pulse: 77 78  Resp: 16 16  Temp:    SpO2: 96% 97%     Final Clinical Impressions(s) / ED Diagnoses   Final diagnoses:  Dizziness    ED Discharge Orders         Ordered    meclizine (ANTIVERT) 12.5 MG tablet  3 times daily PRN     03/23/19 1213    ondansetron (ZOFRAN ODT) 4 MG disintegrating tablet  Every 8 hours PRN     03/23/19 [place], [place] R, PA-C 03/23/19 1220    Molpus, John, MD 03/25/19 2240

## 2019-03-23 NOTE — ED Triage Notes (Signed)
Pt reports from home via [facility] after having 3 bouts of dizziness both upon standing to use the bathroom and while sitting in his recliner. Upon EMS arrival the pt experienced nausea.

## 2019-03-23 NOTE — ED Notes (Signed)
Bed: [id number] Expected date:  Expected time:  Means of arrival:  Comments: 78 yo M/ dizziness

## 2019-03-24 ENCOUNTER — Telehealth: Payer: Self-pay | Admitting: Internal Medicine

## 2019-03-24 NOTE — Telephone Encounter (Signed)
Noted.  Will close encounter.  

## 2019-03-24 NOTE — Telephone Encounter (Signed)
Called and spoke with pt. Pt is currently scheduled for HFU with MW 4/3 and states he wants to come to the office for appt and does not want to do televisit.  Pt denies any current complaints of fever, increased SOB, chest tightness, or cough. Pt also states he has not done any recent traveling and also has not been around anyone that has been sick.  Dr. Melvyn Novas, please advise if you are fine with pt coming into the office for his appt with you. Thanks!

## 2019-03-24 NOTE — Telephone Encounter (Signed)
That's fine

## 2019-03-25 ENCOUNTER — Telehealth: Payer: Self-pay | Admitting: Internal Medicine

## 2019-03-25 MED ORDER — MECLIZINE HCL 12.5 MG PO TABS: 12.5000 mg | ORAL_TABLET | ORAL | 5 refills | Status: DC | PRN

## 2019-03-25 NOTE — Telephone Encounter (Signed)
Dr. Melvyn Jacobson patient was seen in ED 03/23/19 for dizziness and has a hospital f/u on 03/26/19. They only gave in #6 on meclizine to take 1 tab tid as needed for dizziness.  Pt took his last pill today and is asking for a refill.  Please advise if ok to refill?

## 2019-03-25 NOTE — Telephone Encounter (Signed)
Ok to refill #40  X 5 refills to take up to q 4 h prn

## 2019-03-25 NOTE — Telephone Encounter (Signed)
Returned call to patient and verified pharmacy. He wants rx sent to [facility] [address]. Rx sent HFU verified for tomorrow 03/26/19 Nothing further needed.

## 2019-03-26 ENCOUNTER — Ambulatory Visit (INDEPENDENT_AMBULATORY_CARE_PROVIDER_SITE_OTHER): Payer: Medicare Other | Admitting: Internal Medicine

## 2019-03-26 ENCOUNTER — Encounter: Payer: Self-pay | Admitting: Internal Medicine

## 2019-03-26 ENCOUNTER — Other Ambulatory Visit: Payer: Self-pay

## 2019-03-26 VITALS — BP 126/70 | HR 85 | Temp 98.1°F | Ht 66.0 in | Wt 190.8 lb

## 2019-03-26 DIAGNOSIS — J4550 Severe persistent asthma, uncomplicated: Secondary | ICD-10-CM | POA: Diagnosis not present

## 2019-03-26 DIAGNOSIS — H8110 Benign paroxysmal vertigo, unspecified ear: Secondary | ICD-10-CM | POA: Diagnosis not present

## 2019-03-26 MED ORDER — BUDESONIDE-FORMOTEROL FUMARATE 160-4.5 MCG/ACT IN AERO: 2.0000 | INHALATION_SPRAY | Freq: Two times a day (BID) | RESPIRATORY_TRACT | 0 refills | Status: DC

## 2019-03-26 NOTE — Progress Notes (Signed)
Subjective:   Patient ID: [name]    DOB: October 24, 1941     Brief patient profile: 28  yobm denies ever smoking but has lifelong asthma with severe chronic pattern and best FEV1 of around 2.2 liters documented 2002 and also documented non-adherence  followed in pulmonary for primary care also for hbp and hyperlipidemia.   History of Present Illness    07/30/13 Allergy eval rx completed x 4 years > d/c'd and f/u allergy prn    04/03/2015 Follow up : chronic asthma/ HTN/non adherence / relies on samples/ no formulary / has med calendar  Pt returns for 1 month follow up .  Doing well with no asthma flare , on Dulera . Marland Kitchen  Need to establish with dentist > multiple teeth extracted   05/06/2016 NP Follow up ; Severe chronic asthma  Patient presents for 1 month follow up and med review We reviewed all his medications organize them into a medication count with patient education Appears he is taking medications correctly. However, does depend on samples. He did not have his Pepcid or Protonix today but says he has them at home. Says overall his breathing is doing well without any flare cough or wheezing. Seen by allergy Dr. Annamaria Jacobson on 04/23/16   , IgE 203, elevated eos Looking into Nucala.  Prevnar and PVX utd.  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 Is able to sing without sob.  rec No change rx   06/04/16 Dr [name] rec Nucala > could not afford deductible       03/26/2019  f/u ov/[name] re:  Chronic asthma/ new BPV better with meclizine / maint on symbicort 160 2bid but sample dep/ Prednisone is plan D but not using  Chief Complaint  Patient presents with  . Hospitalization Follow-up    Dizziness. Patient stated he has not been having any issues with his breathing.  Dyspnea:  Not limited by breathing from desired activities  /very sedentary / nl pace flat surfaces/ shopping ok  Cough: none Sleeping: bed flat  1-2 pillows  SABA use: neb once daily due to allergies but has not  started prednisone yet  02: none    No obvious day to day or daytime variability or assoc excess/ purulent sputum or mucus plugs or hemoptysis or cp or chest tightness, subjective wheeze or overt sinus or hb symptoms.   Sleeping as above  without nocturnal  or early am exacerbation  of respiratory  c/o's or need for noct saba. Also denies any obvious fluctuation of symptoms with weather or environmental changes or other aggravating or alleviating factors except as outlined above   No unusual exposure hx or h/o childhood pna  or knowledge of premature birth.  Current Allergies, Complete Past Medical History, Past Surgical History, Family History, and Social History were reviewed in [employer] record.  ROS  The following are not active complaints unless bolded Hoarseness, sore throat, dysphagia, dental problems, itching, sneezing,  nasal congestion or discharge of excess mucus or purulent secretions, ear ache,   fever, chills, sweats, unintended wt loss or wt gain, classically pleuritic or exertional cp,  orthopnea pnd or arm/hand swelling  or leg swelling, presyncope, palpitations, abdominal pain, anorexia, nausea, vomiting, diarrhea  or change in bowel habits or change in bladder habits, change in stools or change in urine, dysuria, hematuria,  rash, arthralgias, visual complaints, headache, numbness, weakness or ataxia or problems with walking or coordination,  change in mood or  memory.        Current Meds  Medication Sig  . albuterol (PROAIR HFA) 108 (90 Base) MCG/ACT inhaler 2 puffs every 4 hours as needed for wheezing ((PLANB))  . albuterol (PROVENTIL) (2.5 MG/3ML) 0.083% nebulizer solution USE 1 VIAL IN NEBULIZER EVERY 4 HOURS AS NEEDED (Patient taking differently: Take 2.5 mg by nebulization every 4 (four) hours as needed for wheezing or shortness of breath. USE 1 VIAL IN NEBULIZER EVERY 4 HOURS AS NEEDED)  . aspirin 81 MG EC tablet Take 81 mg by mouth every  morning.   . famotidine (PEPCID) 20 MG tablet Take 20 mg by mouth daily.   . Ibuprofen 200 MG CAPS Take 200 mg by mouth every 6 (six) hours as needed (headache/pain).   . meclizine (ANTIVERT) 12.5 MG tablet Take 1 tablet (12.5 mg total) by mouth every 4 (four) hours as needed for dizziness.  . ondansetron (ZOFRAN ODT) 4 MG disintegrating tablet Take 1 tablet (4 mg total) by mouth every 8 (eight) hours as needed for nausea or vomiting.  Marland Kitchen oxymetazoline (AFRIN) 0.05 % nasal spray Place 1 spray into both nostrils 2 (two) times daily as needed for congestion.  . Polyvinyl Alcohol-Povidone (REFRESH OP) Apply 1 drop to eye as needed (dry eyes).   . predniSONE (DELTASONE) 10 MG tablet 2 daily until better, then 1 x 3 days and stop  . Respiratory Therapy Supplies (FLUTTER) DEVI Use every 4 hours as needed for cough and congestion                       Past Medical Hx ASTHMA (ICD-493.90)  --chronic severe asthma with best FEV1 2.2 L documented 01/2001 and  documented non-adherence Mainly lives off samples of inhalers and nose sprays  - HFA 75% June 15, 2009 > 90% August 02, 2009 > 100% December 27, 2009  - Off allergy vaccines x7/2010 > highly allergic skin tests per Dr [name] > restarted vaccine November 06, 2009 > stopped  07/2013  SHOULDER PAIN, RIGHT (ICD-719.41)   HYPERTENSION, BORDERLINE (ICD-401.9)  Shingles T 8 Left 9/01  HEALTH MAINTENANCE...............................................................................................Marland KitchenWert  - Colonoscopy 12/1989,  2011  - dT 07/25/2014  - Pneumovax 3/04 and 05/2009 - Prevnar 07/25/14 -Flu declined October 17, 2008 and September 21, 2009 , declined flu shot 2015, 2017  -CPX 07/25/2014              Objective:   Physical Exam     amb bm nad   Vital signs reviewed - Note on arrival 02 sats  97% on RA    05/23/2014  201> 201 07/04/2014 > 07/25/14  196>198 08/22/2014 >194 09/19/2014 >198 10/11/2014 > 10/24/2014  196>   01/02/2015 193 >  03/06/2015  198 >198 04/03/2015 > 05/16/2015 196 >  06/02/2015 196 >197 06/16/2015 > 12/12/2015 199> 197 01/08/2016 >   02/16/2016     194 > 04/08/2016  193> 08/06/2016    196 > 12/31/2016  191 > 01/28/2017  191  > 06/19/2017  192 > 10/24/2017   190 > 02/19/2018  192 > 06/09/2018 197> 08/11/2018    195 > 12/08/2018  192 > 01/05/2019   189 > 02/23/2019   190 > 03/26/2019  190    HEENT: nl dentition / oropharynx. Nl external ear canals without cough reflex -  Mild bilateral non-specific turbinate edema     NECK :  without JVD/Nodes/TM/ nl carotid upstrokes bilaterally   LUNGS: no acc muscle use,  Mod barrel  contour chest wall with bilateral  Distant bs with distant early exp wheeze without cough on insp or exp maneuver and mod  Hyperresonant  to  percussion bilaterally.    CV:  RRR  no s3 or murmur or increase in P2, and no edema   ABD:  soft and nontender with pos mid insp Hoover's  in the supine position. No bruits or organomegaly appreciated, bowel sounds nl  MS:   Nl gait/  ext warm without deformities, calf tenderness, cyanosis or clubbing No obvious joint restrictions   SKIN: warm and dry without lesions    NEURO:  alert, approp, nl sensorium with  no motor or cerebellar deficits apparent.  No nystagmus inducible with motion changes (p meclizine w/in 6 h of ov)          I   reviewed   radiology impression as follows:  MRBrain 03/23/2019 1. No acute intracranial abnormality. 2. Minimal chronic small vessel ischemic disease.

## 2019-03-26 NOTE — Patient Instructions (Signed)
Plan A = Automatic = symbicort 160 Take 2 puffs first thing in am and then another 2 puffs about 12 hours later.   Work on inhaler technique:  relax and gently blow all the way out then take a nice smooth deep breath back in, triggering the inhaler at same time you start breathing in.  Hold for up to 5 seconds if you can. Blow out thru nose. Rinse and gargle with water when done      Plan B = Backup Only use your albuterol inhaler as a rescue medication to be used if you can't catch your breath by resting or doing a relaxed purse lip breathing pattern.  - The less you use it, the better it will work when you need it. - Ok to use the inhaler up to 2 puffs  every 4 hours if you must but call for appointment if use goes up over your usual need - Don't leave home without it !!  (think of it like the spare tire for your car)   Plan C = Crisis - only use your albuterol nebulizer if you first try Plan B and it fails to help > ok to use the nebulizer up to every 4 hours but if start needing it regularly call for immediate appointment   Plan D = Deltasone  If doing ABC and not improving then prednisone 10 mg x 2 until better then 1 daily x 3 days and stop  Plan E = ER - go to ER or call 911 if all else fails     Please schedule a follow up office visit in 4 weeks, sooner if needed  with all medications /inhalers/ solutions in hand so we can verify exactly what you are taking. This includes all medications from all doctors and over the counters

## 2019-03-27 ENCOUNTER — Encounter: Payer: Self-pay | Admitting: Internal Medicine

## 2019-03-27 DIAGNOSIS — H8110 Benign paroxysmal vertigo, unspecified ear: Secondary | ICD-10-CM | POA: Insufficient documentation

## 2019-03-27 NOTE — Assessment & Plan Note (Signed)
Onset in childhood  --chronic severe asthma with best FEV1 2.2 L documented 01/2001 and documented non-adherence Mainly lives off samples of inhalers and nose sprays  - Off allergy vaccines x7/2010 > highly allergic skin tests per Dr Annamaria Boots > restart vaccine November 06, 2009>stop 07/30/13 to watch - Spirometry on "good day" 01/14/12 =  FEV1 1.54  Ratio 0.58  - 02/16/2016 added prn prednisone when needing neb - FENO 04/23/16- 32  -CBC with differential 04/23/2016-eosinophils elevated 7%  - Spirometry 01/28/2017  FEV1 1.23 (49%)  Ratio 60    - Spirometry 08/11/2018  FEV1 1.43 (60%)  Ratio 60  - FENO 08/11/2018  =   30 on symb 160 2bid  - 03/26/2019  After extensive coaching inhaler device,  effectiveness =    75%    Adequate control on present rx, reviewed in detail with pt > no change in rx needed > continues to struggle with adherence and understanding how/ when to use backup meds> ABCD contingencied reviewed with Plan D = pred x 20 until better then 10 mg daily x 3 days and stop

## 2019-03-27 NOTE — Assessment & Plan Note (Signed)
Onset late march 2020 > MRI brain ok 03/23/2019 - 03/26/2019 improved on prn meclizine   Adequate control on present rx, reviewed in detail with pt > no change in rx needed  > rec ent eval if needing meclizine longterm but no need to be out for this until  COVID - 19 restrictions have been lifted.    I had an extended discussion with the patient reviewing all relevant studies completed to date and  lasting 15 to 20 minutes of a 25 minute visit    See device teaching which extended face to face time for this visit   Each maintenance medication was reviewed in detail including most importantly the difference between maintenance and prns and under what circumstances the prns are to be triggered using an action plan format that is not reflected in the computer generated alphabetically organized AVS but trather by a customized med calendar that reflects the AVS meds with confirmed 100% correlation.   In addition, Please see AVS for unique instructions that I personally wrote and verbalized to the the pt in detail and then reviewed with pt  by my nurse highlighting any  changes in therapy recommended at today's visit to their plan of care.

## 2019-04-16 ENCOUNTER — Ambulatory Visit: Payer: Medicare Other | Admitting: Internal Medicine

## 2019-04-19 ENCOUNTER — Other Ambulatory Visit: Payer: Self-pay

## 2019-04-19 ENCOUNTER — Ambulatory Visit (INDEPENDENT_AMBULATORY_CARE_PROVIDER_SITE_OTHER): Payer: Medicare Other | Admitting: Internal Medicine

## 2019-04-19 ENCOUNTER — Encounter: Payer: Self-pay | Admitting: Internal Medicine

## 2019-04-19 DIAGNOSIS — J4550 Severe persistent asthma, uncomplicated: Secondary | ICD-10-CM | POA: Diagnosis not present

## 2019-04-19 DIAGNOSIS — J302 Other seasonal allergic rhinitis: Secondary | ICD-10-CM

## 2019-04-19 DIAGNOSIS — J3089 Other allergic rhinitis: Secondary | ICD-10-CM

## 2019-04-19 NOTE — Patient Instructions (Addendum)
Plan A = Automatic = symbicort 160 Take 2 puffs first thing in am and then another 2 puffs about 12 hours later.   Work on inhaler technique:  relax and gently blow all the way out then take a nice smooth deep breath back in, triggering the inhaler at same time you start breathing in.  Hold for up to 5 seconds if you can. Blow out thru nose. Rinse and gargle with water when done  Plan B = albuterol inhaler but you don't have one so skip this step  Plan C = Crisis - only use your albuterol nebulizer -ok to use the nebulizer up to every 4 hours but if start needing it regularly call for immediate appointment   Plan D = Deltasone If need to use the nebulizer more than you do now then take prednsione 10 m x 2 each am until better then 1 daily   Plan E = ER - go to ER or call 911 if all else fails     Please schedule a follow up office visit in 4 weeks, sooner if needed  with all medications /inhalers/ solutions in hand so we can verify exactly what you are taking. This includes all medications from all doctors and over the counters - has 30 day samples of symbicort 160 with about 30 left on present rx

## 2019-04-19 NOTE — Assessment & Plan Note (Signed)
Onset in childhood  --chronic severe asthma with best FEV1 2.2 L documented 01/2001 and documented non-adherence Mainly lives off samples of inhalers and nose sprays  - Off allergy vaccines x7/2010 > highly allergic skin tests per Dr Annamaria Boots > restart vaccine November 06, 2009>stop 07/30/13 to watch - Spirometry on "good day" 01/14/12 =  FEV1 1.54  Ratio 0.58  - 02/16/2016 added prn prednisone when needing neb - FENO 04/23/16- 32  -CBC with differential 04/23/2016-eosinophils elevated 7%  - Spirometry 01/28/2017  FEV1 1.23 (49%)  Ratio 60    - Spirometry 08/11/2018  FEV1 1.43 (60%)  Ratio 60  - FENO 08/11/2018  =   30 on symb 160 2bid   - 04/19/2019  After extensive coaching inhaler device,  effectiveness =    75% from a baseline of 50%   Continues to be challenged in terms of med reconciliation/ adherence related to absence of prescription coverage, optimal hfa and following maint vs action plan on med calendar but not overusing saba or prednsione at present so will leave well enough  Alone here.

## 2019-04-19 NOTE — Progress Notes (Signed)
Subjective:   Patient ID: [name]    DOB: 1941/08/15     Brief patient profile: 40  yobm denies ever smoking but has lifelong asthma with severe chronic pattern and best FEV1 of around 2.2 liters documented 2002 and also documented non-adherence  followed in pulmonary for primary care also for hbp and hyperlipidemia.   History of Present Illness    07/30/13 Allergy eval rx completed x 4 years > d/c'd and f/u allergy prn    04/03/2015 Follow up : chronic asthma/ HTN/non adherence / relies on samples/ no formulary / has med calendar  Pt returns for 1 month follow up .  Doing well with no asthma flare , on Dulera . Marland Kitchen  Need to establish with dentist > multiple teeth extracted   05/06/2016 NP Follow up ; Severe chronic asthma  Patient presents for 1 month follow up and med review We reviewed all his medications organize them into a medication count with patient education Appears he is taking medications correctly. However, does depend on samples. He did not have his Pepcid or Protonix today but says he has them at home. Says overall his breathing is doing well without any flare cough or wheezing. Seen by allergy Dr. Annamaria Boots on 04/23/16   , IgE 203, elevated eos Looking into Nucala.  Prevnar and PVX utd.  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 Is able to sing without sob.  rec No change rx   06/04/16 Dr Annamaria Boots rec Nucala > could not afford deductible       03/26/2019  f/u ov/[name] re:  Chronic asthma/ new BPV better with meclizine / maint on symbicort 160 2bid but sample dep/ Prednisone is plan D but not using  Chief Complaint  Patient presents with  . Hospitalization Follow-up    Dizziness. Patient stated he has not been having any issues with his breathing.  Dyspnea:  Not limited by breathing from desired activities  /very sedentary / nl pace flat surfaces/ shopping ok  Cough: none Sleeping: bed flat  1-2 pillows  SABA use: neb once daily due to allergies but has not  started prednisone yet  02: none  rec Plan A = Automatic = symbicort 160 Take 2 puffs first thing in am and then another 2 puffs about 12 hours later.  Work on Education officer, community B = Backup Plan C = Crisis - only use your albuterol nebulizer if you first try Plan B and it fails to help Plan D = Deltasone  If doing ABC and not improving then prednisone 10 mg x 2 until better then 1 daily x 3 days and stop Plan E = ER - go to ER or call 911 if all else fails     04/19/2019  f/u ov/[name] re: chronic asthma  Chief Complaint  Patient presents with  . Follow-up    Breathing is doing well today. He has not been using his albuterol inhaler or neb.  Dyspnea:  MMRC2 = can't walk a nl pace on a flat grade s sob but does fine slow and flat e Cough: clearing thorat a lot  Sleeping: ok flat/ one pillow SABA use: only has neb, hasn't used in a while  02: none Does not recall last time he needed pred as plan C   No obvious day to day or daytime variability or assoc excess/ purulent sputum or mucus plugs or hemoptysis or cp or chest tightness, subjective wheeze or overt sinus  or hb symptoms.   Sleep  without nocturnal  or early am exacerbation  of respiratory  c/o's or need for noct saba. Also denies any obvious fluctuation of symptoms with weather or environmental changes or other aggravating or alleviating factors except as outlined above   No unusual exposure hx or h/o childhood pna  or knowledge of premature birth.  Current Allergies, Complete Past Medical History, Past Surgical History, Family History, and Social History were reviewed in [employer] record.  ROS  The following are not active complaints unless bolded Hoarseness, sore throat, dysphagia, dental problems, itching, sneezing,  nasal congestion using daily afrin s nasal steroids or discharge of excess mucus or purulent secretions, ear ache,   fever, chills, sweats, unintended wt loss or wt gain, classically  pleuritic or exertional cp,  orthopnea pnd or arm/hand swelling  or leg swelling, presyncope, palpitations, abdominal pain, anorexia, nausea, vomiting, diarrhea  or change in bowel habits or change in bladder habits, change in stools or change in urine, dysuria, hematuria,  rash, arthralgias, visual complaints, headache, numbness, weakness or ataxia or problems with walking or coordination,  change in mood or  memory.        Current Meds  Medication Sig  . albuterol (PROAIR HFA) 108 (90 Base) MCG/ACT inhaler 2 puffs every 4 hours as needed for wheezing ((PLANB))  . albuterol (PROVENTIL) (2.5 MG/3ML) 0.083% nebulizer solution USE 1 VIAL IN NEBULIZER EVERY 4 HOURS AS NEEDED (Patient taking differently: Take 2.5 mg by nebulization every 4 (four) hours as needed for wheezing or shortness of breath. USE 1 VIAL IN NEBULIZER EVERY 4 HOURS AS NEEDED)  . Ascorbic Acid (VITAMIN C) 1000 MG tablet Take 1,000 mg by mouth daily.  Marland Kitchen aspirin 81 MG EC tablet Take 81 mg by mouth every morning.   . budesonide-formoterol (SYMBICORT) 160-4.5 MCG/ACT inhaler Inhale 2 puffs into the lungs 2 (two) times daily.  . CHELATED MAGNESIUM PO Take 1 tablet by mouth daily.  . cholecalciferol (VITAMIN D3) 25 MCG (1000 UT) tablet Take 1,000 Units by mouth daily.  . cyanocobalamin 1000 MCG tablet Take 1,000 mcg by mouth daily.  . famotidine (PEPCID) 20 MG tablet Take 20 mg by mouth daily.   Marland Kitchen guaifenesin (HUMIBID E) 400 MG TABS tablet Take 400 mg by mouth every 4 (four) hours as needed.  . Ibuprofen 200 MG CAPS Take 200 mg by mouth every 6 (six) hours as needed (headache/pain).   . meclizine (ANTIVERT) 12.5 MG tablet Take 1 tablet (12.5 mg total) by mouth every 4 (four) hours as needed for dizziness.  Marland Kitchen oxymetazoline (AFRIN) 0.05 % nasal spray Place 1 spray into both nostrils 2 (two) times daily as needed for congestion.  . Polyvinyl Alcohol-Povidone (REFRESH OP) Apply 1 drop to eye as needed (dry eyes).   . predniSONE (DELTASONE)  10 MG tablet 2 daily until better, then 1 x 3 days and stop  . Respiratory Therapy Supplies (FLUTTER) DEVI Use every 4 hours as needed for cough and congestion                         Past Medical Hx ASTHMA (ICD-493.90)  --chronic severe asthma with best FEV1 2.2 L documented 01/2001 and  documented non-adherence Mainly lives off samples of inhalers and nose sprays  - HFA 75% June 15, 2009 > 90% August 02, 2009 > 100% December 27, 2009  - Off allergy vaccines x7/2010 > highly allergic skin tests per  Dr Annamaria Boots > restarted vaccine November 06, 2009 > stopped  07/2013  SHOULDER PAIN, RIGHT (ICD-719.41)   HYPERTENSION, BORDERLINE (ICD-401.9)  Shingles T 8 Left 9/01  HEALTH MAINTENANCE...............................................................................................Marland KitchenWert  - Colonoscopy 12/1989,  2011  - dT 07/25/2014  - Pneumovax 3/04 and 05/2009 - Prevnar 07/25/14 -Flu declined October 17, 2008 and September 21, 2009 , declined flu shot 2015, 2017  -CPX 07/25/2014              Objective:   Physical Exam    amb bm nad   Vital signs reviewed - Note on arrival 02 sats  98% on RA    05/23/2014  201> 201 07/04/2014 > 07/25/14  196>198 08/22/2014 >194 09/19/2014 >198 10/11/2014 > 10/24/2014  196>   01/02/2015 193 > 03/06/2015  198 >198 04/03/2015 > 05/16/2015 196 >  06/02/2015 196 >197 06/16/2015 > 12/12/2015 199> 197 01/08/2016 >   02/16/2016     194 > 04/08/2016  193> 08/06/2016    196 > 12/31/2016  191 > 01/28/2017  191  > 06/19/2017  192 > 10/24/2017   190 > 02/19/2018  192 > 06/09/2018 197> 08/11/2018    195 > 12/08/2018  192 > 01/05/2019   189 > 02/23/2019   190 > 03/26/2019  190 > 04/19/2019  193     HEENT: poor  dentition / nl oropharynx. Nl external ear canals without cough reflex -  Mild bilateral non-specific turbinate edema     NECK :  without JVD/Nodes/TM/ nl carotid upstrokes bilaterally   LUNGS: no acc muscle use,  Mild barrel  contour chest wall with bilateral  Distant bs s audible  wheeze and  without cough on insp or exp maneuver and mild  Hyperresonant  to  percussion bilaterally     CV:  RRR  no s3 or murmur or increase in P2, and no edema   ABD:  soft and nontender with pos mid/late insp Hoover's  in the supine position. No bruits or organomegaly appreciated, bowel sounds nl  MS:   Nl gait/  ext warm without deformities, calf tenderness, cyanosis or clubbing No obvious joint restrictions   SKIN: warm and dry without lesions    NEURO:  alert, approp, nl sensorium with  no motor or cerebellar deficits apparent.

## 2019-04-19 NOTE — Assessment & Plan Note (Signed)
Lifelong pattern  Off allergy vaccines x7/2010 > highly allergic skin tests per Dr Annamaria Boots > restarted vaccine November 06, 2009  Allergy vaccine 1:50:000 10/30/09; adv to 1:10 11/20/10 [facility]. D/Cd 07/30/13 - allergy profile 12/12/15 >  Eos 0.3 /   IgE  243 multiple pos RAST with dog the worst  - 07/2016 approved for nucala  But could not afford deductible   Advised: I emphasized that nasal steroids have no immediate benefit in terms of improving symptoms.  To help them reached the target tissue, the patient should use Afrin two puffs every 12 hours applied one min before using the nasal steroids.  Afrin should be stopped after no more than 5 days.  If the symptoms worsen, Afrin can be restarted after 5 days off of therapy to prevent rebound congestion from overuse of Afrin.  I also emphasized that in no way are nasal steroids a concern in terms of "addiction".    F/u monthly to avoid exac/ er trips and keep up with samples    I had an extended discussion with the patient reviewing all relevant studies completed to date and  lasting 15 to 20 minutes of a 25 minute visit    See device teaching which extended face to face time for this visit    Each maintenance medication was reviewed in detail including most importantly the difference between maintenance and prns and under what circumstances the prns are to be triggered using an action plan format that is not reflected in the computer generated alphabetically organized AVS but trather by a customized med calendar that reflects the AVS meds with confirmed 100% correlation.   In addition, Please see AVS for unique instructions that I personally wrote and verbalized to the the pt in detail and then reviewed with pt  by my nurse highlighting any  changes in therapy recommended at today's visit to their plan of care.

## 2019-05-17 DIAGNOSIS — M546 Pain in thoracic spine: Secondary | ICD-10-CM | POA: Diagnosis not present

## 2019-05-17 DIAGNOSIS — M545 Low back pain: Secondary | ICD-10-CM | POA: Diagnosis not present

## 2019-05-17 DIAGNOSIS — M542 Cervicalgia: Secondary | ICD-10-CM | POA: Diagnosis not present

## 2019-05-17 DIAGNOSIS — M9901 Segmental and somatic dysfunction of cervical region: Secondary | ICD-10-CM | POA: Diagnosis not present

## 2019-05-18 ENCOUNTER — Ambulatory Visit: Payer: Medicare Other | Admitting: Internal Medicine

## 2019-05-21 ENCOUNTER — Ambulatory Visit (INDEPENDENT_AMBULATORY_CARE_PROVIDER_SITE_OTHER): Payer: Medicare Other | Admitting: Internal Medicine

## 2019-05-21 ENCOUNTER — Other Ambulatory Visit: Payer: Self-pay

## 2019-05-21 ENCOUNTER — Encounter: Payer: Self-pay | Admitting: Internal Medicine

## 2019-05-21 VITALS — BP 138/82 | HR 87 | Temp 98.1°F | Ht 66.0 in | Wt 194.6 lb

## 2019-05-21 DIAGNOSIS — J4550 Severe persistent asthma, uncomplicated: Secondary | ICD-10-CM

## 2019-05-21 MED ORDER — BUDESONIDE-FORMOTEROL FUMARATE 160-4.5 MCG/ACT IN AERO: 2.0000 | INHALATION_SPRAY | Freq: Two times a day (BID) | RESPIRATORY_TRACT | 0 refills | Status: DC

## 2019-05-21 NOTE — Progress Notes (Signed)
Subjective:   Patient ID: [name]    DOB: April 20, 1941     Brief patient profile: 60  yobm denies ever smoking but has lifelong asthma with severe chronic pattern and best FEV1 of around 2.2 liters documented 2002 and also documented non-adherence  followed in pulmonary for primary care also for hbp and hyperlipidemia.   History of Present Illness    07/30/13 Allergy eval rx completed x 4 years > d/c'd and f/u allergy prn    04/03/2015 Follow up : chronic asthma/ HTN/non adherence / relies on samples/ no formulary / has med calendar  Pt returns for 1 month follow up .  Doing well with no asthma flare , on Dulera . Marland Kitchen  Need to establish with dentist > multiple teeth extracted   05/06/2016 NP Follow up ; Severe chronic asthma  Patient presents for 1 month follow up and med review We reviewed all his medications organize them into a medication count with patient education Appears he is taking medications correctly. However, does depend on samples. He did not have his Pepcid or Protonix today but says he has them at home. Says overall his breathing is doing well without any flare cough or wheezing. Seen by allergy Dr. Annamaria Boots on 04/23/16   , IgE 203, elevated eos Looking into Nucala.  Prevnar and PVX utd.  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 Is able to sing without sob.  rec No change rx   06/04/16 Dr Annamaria Boots rec Nucala > could not afford deductible       03/26/2019  f/u ov/Nehan Flaum re:  Chronic asthma/ new BPV better with meclizine / maint on symbicort 160 2bid but sample dep/ Prednisone is plan D but not using  Chief Complaint  Patient presents with  . Hospitalization Follow-up    Dizziness. Patient stated he has not been having any issues with his breathing.  Dyspnea:  Not limited by breathing from desired activities  /very sedentary / nl pace flat surfaces/ shopping ok  Cough: none Sleeping: bed flat  1-2 pillows  SABA use: neb once daily due to allergies but has not  started prednisone yet  02: none  rec Plan A = Automatic = symbicort 160 Take 2 puffs first thing in am and then another 2 puffs about 12 hours later.  Work on Education officer, community B = Backup Plan C = Crisis - only use your albuterol nebulizer if you first try Plan B and it fails to help Plan D = Deltasone  If doing ABC and not improving then prednisone 10 mg x 2 until better then 1 daily x 3 days and stop Plan E = ER - go to ER or call 911 if all else fails     04/19/2019  f/u ov/Ridley Dileo re: chronic asthma  Chief Complaint  Patient presents with  . Follow-up    Breathing is doing well today. He has not been using his albuterol inhaler or neb.  Dyspnea:  MMRC2 = can't walk a nl pace on a flat grade s sob but does fine slow and flat e Cough: clearing thorat a lot  Sleeping: ok flat/ one pillow SABA use: only has neb, hasn't used in a while  02: none Does not recall last time he needed pred as plan C rec Plan A = Automatic = symbicort 160 Take 2 puffs first thing in am and then another 2 puffs about 12 hours later.  Work on inhaler technique:  Plan B = albuterol inhaler but you don't have one so skip this step Plan C = Crisis - only use your albuterol nebulizer -ok to use the nebulizer up to every 4 hours but if start needing it regularly call for immediate appointment Plan D = Deltasone If need to use the nebulizer more than you do now then take prednsione 10 m x 2 each am until better then 1 daily  Plan E = ER - go to ER or call 911 if all else fails   Please schedule a follow up office visit in 4 weeks, sooner if needed  with all medications /inhalers/ solutions in hand so we can verify exactly what you are taking. This includes all medications from all doctors and over the counters - has 30 day samples of symbicort 160 with about 30 left on present rx     05/21/2019  f/u ov/Jehan Bonano re: confused again re action plan / the one med he doesn't have on him his his rescue saba  Chief  Complaint  Patient presents with  . Follow-up    Breathing is overall doinwell well. He is using his ventolin about 2 x per day and has not used his neb lately. Needs refills on pepcid and meclizine.    Dyspnea:  MMRC2 = can't walk a nl pace on a flat grade s sob but does fine slow and flat  Cough: none Sleeping: lie flat ok  SABA use: twice daily whether he needs it or not  02: none    No obvious day to day or daytime variability or assoc excess/ purulent sputum or mucus plugs or hemoptysis or cp or chest tightness, subjective wheeze or overt sinus or hb symptoms.   Sleeping as above  without nocturnal  or early am exacerbation  of respiratory  c/o's or need for noct saba. Also denies any obvious fluctuation of symptoms with weather or environmental changes or other aggravating or alleviating factors except as outlined above   No unusual exposure hx or h/o childhood pna  or knowledge of premature birth.  Current Allergies, Complete Past Medical History, Past Surgical History, Family History, and Social History were reviewed in [employer] [facility] record.  ROS  The following are not active complaints unless bolded Hoarseness, sore throat, dysphagia, dental problems, itching, sneezing,  nasal congestion or discharge of excess mucus or purulent secretions, ear ache,   fever, chills, sweats, unintended wt loss or wt gain, classically pleuritic or exertional cp,  orthopnea pnd or arm/hand swelling  or leg swelling, presyncope, palpitations, abdominal pain, anorexia, nausea, vomiting, diarrhea  or change in bowel habits or change in bladder habits, change in stools or change in urine, dysuria, hematuria,  rash, arthralgias, visual complaints, headache, numbness, weakness or ataxia or problems with walking or coordination,  change in mood or  memory.        Current Meds  Medication Sig  .     . albuterol (PROVENTIL) (2.5 MG/3ML) 0.083% nebulizer solution USE 1 VIAL IN NEBULIZER  EVERY 4 HOURS AS NEEDED (Patient taking differently: Take 2.5 mg by nebulization every 4 (four) hours as needed for wheezing or shortness of breath. USE 1 VIAL IN NEBULIZER EVERY 4 HOURS AS NEEDED)  . Ascorbic Acid (VITAMIN C) 1000 MG tablet Take 1,000 mg by mouth daily.  Marland Kitchen aspirin 81 MG EC tablet Take 81 mg by mouth every morning.   . budesonide-formoterol (SYMBICORT) 160-4.5 MCG/ACT inhaler Inhale 2 puffs into the lungs 2 (two)  times daily.  . CHELATED MAGNESIUM PO Take 1 tablet by mouth daily.  . cholecalciferol (VITAMIN D3) 25 MCG (1000 UT) tablet Take 1,000 Units by mouth daily.  . cyanocobalamin 1000 MCG tablet Take 1,000 mcg by mouth daily.  Marland Kitchen guaifenesin (HUMIBID E) 400 MG TABS tablet Take 400 mg by mouth every 4 (four) hours as needed.  . Ibuprofen 200 MG CAPS Take 200 mg by mouth every 6 (six) hours as needed (headache/pain).   Marland Kitchen oxymetazoline (AFRIN) 0.05 % nasal spray Place 1 spray into both nostrils 2 (two) times daily as needed for congestion.  . Polyvinyl Alcohol-Povidone (REFRESH OP) Apply 1 drop to eye as needed (dry eyes).   Marland Kitchen Respiratory Therapy Supplies (FLUTTER) DEVI Use every 4 hours as needed for cough and congestion                    Past Medical Hx ASTHMA (ICD-493.90)  --chronic severe asthma with best FEV1 2.2 L documented 01/2001 and  documented non-adherence Mainly lives off samples of inhalers and nose sprays  - HFA 75% June 15, 2009 > 90% August 02, 2009 > 100% December 27, 2009  - Off allergy vaccines x7/2010 > highly allergic skin tests per Dr Annamaria Boots > restarted vaccine November 06, 2009 > stopped  07/2013  SHOULDER PAIN, RIGHT (ICD-719.41)   HYPERTENSION, BORDERLINE (ICD-401.9)  Shingles T 8 Left 9/01  HEALTH MAINTENANCE...............................................................................................Marland KitchenWert  - Colonoscopy 12/1989,  2011  - dT 07/25/2014  - Pneumovax 3/04 and 05/2009 - Prevnar 07/25/14 -Flu declined October 17, 2008 and September 21, 2009 , declined flu shot 2015, 2017  -CPX 07/25/2014              Objective:   Physical Exam    amb bm somewhat of a childlike affect   Vital signs reviewed - Note on arrival 02 sats  95% on RA    05/23/2014  201> 201 07/04/2014 > 07/25/14  196>198 08/22/2014 >194 09/19/2014 >198 10/11/2014 > 10/24/2014  196>   01/02/2015 193 > 03/06/2015  198 >198 04/03/2015 > 05/16/2015 196 >  06/02/2015 196 >197 06/16/2015 > 12/12/2015 199> 197 01/08/2016 >   02/16/2016     194 > 04/08/2016  193> 08/06/2016    196 > 12/31/2016  191 > 01/28/2017  191  > 06/19/2017  192 > 10/24/2017   190 > 02/19/2018  192 > 06/09/2018 197> 08/11/2018    195 > 12/08/2018  192 > 01/05/2019   189 > 02/23/2019   190 > 03/26/2019  190 > 04/19/2019  193     HEENT: poor  dentition / nl oropharynx. Nl external ear canals without cough reflex -  Mild bilateral non-specific turbinate edema       HEENT: Poor dentition/ nl oropharynx. Nl external ear canals without cough reflex -  Mild bilateral non-specific turbinate edema     NECK :  without JVD/Nodes/TM/ nl carotid upstrokes bilaterally   LUNGS: no acc muscle use,  Mod barrel  contour chest wall with bilateral  Distant bs s audible wheeze and  without cough on insp or exp maneuver and mod  Hyperresonant  to  percussion bilaterally     CV:  RRR  no s3 or murmur or increase in P2, and no edema   ABD:  soft and nontender with pos mid insp Hoover's  in the supine position. No bruits or organomegaly appreciated, bowel sounds nl  MS:   Nl gait/  ext warm without deformities, calf tenderness, cyanosis  or clubbing No obvious joint restrictions   SKIN: warm and dry without lesions    NEURO:  alert, approp, nl sensorium with  no motor or cerebellar deficits apparent.

## 2019-05-21 NOTE — Patient Instructions (Addendum)
Plan A = Automatic = symbicort 160 Take 2 puffs first thing in am and then another 2 puffs about 12 hours later.    Work on inhaler technique:  relax and gently blow all the way out then take a nice smooth deep breath back in, triggering the inhaler at same time you start breathing in.  Hold for up to 5 seconds if you can. Blow out thru nose. Rinse and gargle with water when done      Plan B = Backup Only use your albuterol inhaler as a rescue medication to be used if you can't catch your breath by resting or doing a relaxed purse lip breathing pattern.  - The less you use it, the better it will work when you need it. - Ok to use the inhaler up to 2 puffs  every 4 hours if you must but call for appointment if use goes up over your usual need - Don't leave home without it !!  (think of it like the spare tire for your car)   Plan C = Crisis - only use your albuterol nebulizer if you first try Plan B and it fails to help > ok to use the nebulizer up to every 4 hours but if start needing it regularly call for immediate appointment   Plan D = Deltasone If need to use the nebulizer more than you do now then take prednsione 10 m x 2 each am until better then 1 daily  X 5 day and stop   Plan E = ER - go to ER or call 911 if all else fails   Note: symb sample has #6 and 30 days rx so return on day 28 and should have #14 on sample if keeping up with 2 bid rx    Please schedule a follow up office visit in 4 weeks, sooner if needed  with all medications /inhalers/ solutions in hand so we can verify exactly what you are taking. This includes all medications from all doctors and over the counters

## 2019-05-21 NOTE — Assessment & Plan Note (Signed)
Onset in childhood  --chronic severe asthma with best FEV1 2.2 L documented 01/2001 and documented non-adherence Mainly lives off samples of inhalers and nose sprays  - Off allergy vaccines x7/2010 > highly allergic skin tests per Dr Annamaria Boots > restart vaccine November 06, 2009>stop 07/30/13 to watch - Spirometry on "good day" 01/14/12 =  FEV1 1.54  Ratio 0.58  - 02/16/2016 added prn prednisone when needing neb - FENO 04/23/16- 32  -CBC with differential 04/23/2016-eosinophils elevated 7%  - Spirometry 01/28/2017  FEV1 1.23 (49%)  Ratio 60    - Spirometry 08/11/2018  FEV1 1.43 (60%)  Ratio 60  - FENO 08/11/2018  =   30 on symb 160 2bid  05/21/2019  After extensive coaching inhaler device,  effectiveness =    75% (short Ti)     Continues to struggle with fundamentals of asthma control including keeping up with rescue inhaler, hfa technique and contingency rx so went over this again line by line   I had an extended discussion with the patient reviewing all relevant studies completed to date and  lasting 15 to 20 minutes of a 25 minute visit    See device teaching which extended face to face time for this visit.  Each maintenance medication was reviewed in detail including emphasizing most importantly the difference between maintenance and prns and under what circumstances the prns are to be triggered using an action plan format that is not reflected in the computer generated alphabetically organized AVS which I have not found useful in most complex patients, especially with respiratory illnesses  Please see AVS for specific instructions unique to this visit that I personally wrote and verbalized to the the pt in detail and then reviewed with pt  by my nurse highlighting any  changes in therapy recommended at today's visit to their plan of care.

## 2019-05-28 DIAGNOSIS — M545 Low back pain: Secondary | ICD-10-CM | POA: Diagnosis not present

## 2019-05-28 DIAGNOSIS — M9901 Segmental and somatic dysfunction of cervical region: Secondary | ICD-10-CM | POA: Diagnosis not present

## 2019-06-02 DIAGNOSIS — M9902 Segmental and somatic dysfunction of thoracic region: Secondary | ICD-10-CM | POA: Diagnosis not present

## 2019-06-02 DIAGNOSIS — M542 Cervicalgia: Secondary | ICD-10-CM | POA: Diagnosis not present

## 2019-06-02 DIAGNOSIS — M545 Low back pain: Secondary | ICD-10-CM | POA: Diagnosis not present

## 2019-06-02 DIAGNOSIS — M9903 Segmental and somatic dysfunction of lumbar region: Secondary | ICD-10-CM | POA: Diagnosis not present

## 2019-06-02 DIAGNOSIS — M9905 Segmental and somatic dysfunction of pelvic region: Secondary | ICD-10-CM | POA: Diagnosis not present

## 2019-06-02 DIAGNOSIS — M546 Pain in thoracic spine: Secondary | ICD-10-CM | POA: Diagnosis not present

## 2019-06-02 DIAGNOSIS — M9901 Segmental and somatic dysfunction of cervical region: Secondary | ICD-10-CM | POA: Diagnosis not present

## 2019-06-02 DIAGNOSIS — M9904 Segmental and somatic dysfunction of sacral region: Secondary | ICD-10-CM | POA: Diagnosis not present

## 2019-06-07 ENCOUNTER — Other Ambulatory Visit: Payer: Self-pay | Admitting: Internal Medicine

## 2019-06-09 DIAGNOSIS — M545 Low back pain: Secondary | ICD-10-CM | POA: Diagnosis not present

## 2019-06-09 DIAGNOSIS — M542 Cervicalgia: Secondary | ICD-10-CM | POA: Diagnosis not present

## 2019-06-09 DIAGNOSIS — M9901 Segmental and somatic dysfunction of cervical region: Secondary | ICD-10-CM | POA: Diagnosis not present

## 2019-06-09 DIAGNOSIS — M546 Pain in thoracic spine: Secondary | ICD-10-CM | POA: Diagnosis not present

## 2019-06-16 DIAGNOSIS — M545 Low back pain: Secondary | ICD-10-CM | POA: Diagnosis not present

## 2019-06-16 DIAGNOSIS — M9901 Segmental and somatic dysfunction of cervical region: Secondary | ICD-10-CM | POA: Diagnosis not present

## 2019-06-16 DIAGNOSIS — M546 Pain in thoracic spine: Secondary | ICD-10-CM | POA: Diagnosis not present

## 2019-06-16 DIAGNOSIS — M542 Cervicalgia: Secondary | ICD-10-CM | POA: Diagnosis not present

## 2019-06-21 ENCOUNTER — Ambulatory Visit (INDEPENDENT_AMBULATORY_CARE_PROVIDER_SITE_OTHER): Payer: Medicare Other | Admitting: Internal Medicine

## 2019-06-21 ENCOUNTER — Other Ambulatory Visit: Payer: Self-pay

## 2019-06-21 ENCOUNTER — Encounter: Payer: Self-pay | Admitting: Internal Medicine

## 2019-06-21 DIAGNOSIS — J4550 Severe persistent asthma, uncomplicated: Secondary | ICD-10-CM | POA: Diagnosis not present

## 2019-06-21 MED ORDER — BUDESONIDE-FORMOTEROL FUMARATE 160-4.5 MCG/ACT IN AERO: 2.0000 | INHALATION_SPRAY | Freq: Two times a day (BID) | RESPIRATORY_TRACT | 0 refills | Status: DC

## 2019-06-21 NOTE — Progress Notes (Signed)
Subjective:   Patient ID: [name]    DOB: 05/22/1941     Brief patient profile: 19  yobm denies ever smoking but has lifelong asthma with severe chronic pattern and best FEV1 of around 2.2 liters documented 2002 and also documented non-adherence  followed in pulmonary for primary care also for hbp and hyperlipidemia.   History of Present Illness    07/30/13 Allergy eval rx completed x 4 years > d/c'd and f/u allergy prn    04/03/2015 Follow up : chronic asthma/ HTN/non adherence / relies on samples/ no formulary / has med calendar  Pt returns for 1 month follow up .  Doing well with no asthma flare , on Dulera . Marland Kitchen  Need to establish with dentist > multiple teeth extracted   05/06/2016 NP Follow up ; Severe chronic asthma  Patient presents for 1 month follow up and med review We reviewed all his medications organize them into a medication count with patient education Appears he is taking medications correctly. However, does depend on samples. He did not have his Pepcid or Protonix today but says he has them at home. Says overall his breathing is doing well without any flare cough or wheezing. Seen by allergy Dr. Annamaria Boots on 04/23/16   , IgE 203, elevated eos Looking into Nucala.  Prevnar and PVX utd.  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 Is able to sing without sob.  rec No change rx   06/04/16 Dr Annamaria Boots rec Nucala > could not afford deductible       03/26/2019  f/u ov/ re:  Chronic asthma/ new BPV better with meclizine / maint on symbicort 160 2bid but sample dep/ Prednisone is plan D but not using  Chief Complaint  Patient presents with  . Hospitalization Follow-up    Dizziness. Patient stated he has not been having any issues with his breathing.  Dyspnea:  Not limited by breathing from desired activities  /very sedentary / nl pace flat surfaces/ shopping ok  Cough: none Sleeping: bed flat  1-2 pillows  SABA use: neb once daily due to allergies but has not  started prednisone yet  02: none  rec Plan A = Automatic = symbicort 160 Take 2 puffs first thing in am and then another 2 puffs about 12 hours later.  Work on Education officer, community B = Backup Plan C = Crisis - only use your albuterol nebulizer if you first try Plan B and it fails to help Plan D = Deltasone  If doing ABC and not improving then prednisone 10 mg x 2 until better then 1 daily x 3 days and stop Plan E = ER - go to ER or call 911 if all else fails     04/19/2019  f/u ov/ re: chronic asthma  Chief Complaint  Patient presents with  . Follow-up    Breathing is doing well today. He has not been using his albuterol inhaler or neb.  Dyspnea:  MMRC2 = can't walk a nl pace on a flat grade s sob but does fine slow and flat e Cough: clearing thorat a lot  Sleeping: ok flat/ one pillow SABA use: only has neb, hasn't used in a while  02: none Does not recall last time he needed pred as plan C rec Plan A = Automatic = symbicort 160 Take 2 puffs first thing in am and then another 2 puffs about 12 hours later.  Work on inhaler technique:  Plan B = albuterol inhaler but you don't have one so skip this step Plan C = Crisis - only use your albuterol nebulizer -ok to use the nebulizer up to every 4 hours but if start needing it regularly call for immediate appointment Plan D = Deltasone If need to use the nebulizer more than you do now then take prednsione 10 m x 2 each am until better then 1 daily  Plan E = ER - go to ER or call 911 if all else fails   Please schedule a follow up office visit in 4 weeks, sooner if needed  with all medications /inhalers/ solutions in hand so we can verify exactly what you are taking. This includes all medications from all doctors and over the counters - has 30 day samples of symbicort 160 with about 30 left on present rx     05/21/2019  f/u ov/ re: confused again re action plan / the one med he doesn't have on him his his rescue saba  Chief  Complaint  Patient presents with  . Follow-up    Breathing is overall doinwell well. He is using his ventolin about 2 x per day and has not used his neb lately. Needs refills on pepcid and meclizine.   Dyspnea:  MMRC2 = can't walk a nl pace on a flat grade s sob but does fine slow and flat  Cough: none Sleeping: lie flat ok  SABA use: twice daily whether he needs it or not  02: none  rec Plan A = Automatic = symbicort 160 Take 2 puffs first thing in am and then another 2 puffs about 12 hours later.  Work on Orthoptist B = Backup Only use your albuterol inhaler as a rescue medication Plan C = Crisis - only use your albuterol nebulizer if you first try Plan B and it fails to help > ok to use the nebulizer up to every 4 hours but if start needing it regularly call for immediate appointment Plan D = Deltasone If need to use the nebulizer more than you do now then take prednsione 10 m x 2 each am until better then 1 daily  X 5 day and stop  Please schedule a follow up office visit in 4 weeks, sooner if needed  with all medications /inhalers/ solutions in hand so we can verify exactly what you are taking. This includes all medications from all doctors and over the counters   06/21/2019  f/u ov/ re:  Total of 45 puffs left in 2 samples (should be close to 0  Chief Complaint  Patient presents with  . Follow-up    Breathing doing well. He rarely uses rescue inhaler or neb.    Dyspnea:  MMRC2 = can't walk a nl pace on a flat grade s sob but does fine slow and flat eg Cough: none Sleeping: flat  Ok/ one pillow SABA use: rarely/no pred since last ov  02: none    No obvious day to day or daytime variability or assoc excess/ purulent sputum or mucus plugs or hemoptysis or cp or chest tightness, subjective wheeze or overt sinus or hb symptoms.   Sleeping as above without nocturnal  or early am exacerbation  of respiratory  c/o's or need for noct saba. Also denies any obvious  fluctuation of symptoms with weather or environmental changes or other aggravating or alleviating factors except as outlined above   No unusual exposure hx or h/o childhood pna  or  knowledge of premature birth.  Current Allergies, Complete Past Medical History, Past Surgical History, Family History, and Social History were reviewed in [facility] record.  ROS  The following are not active complaints unless bolded Hoarseness, sore throat, dysphagia, dental problems, itching, sneezing,  nasal congestion or discharge of excess mucus or purulent secretions, ear ache,   fever, chills, sweats, unintended wt loss or wt gain, classically pleuritic or exertional cp,  orthopnea pnd or arm/hand swelling  or leg swelling, presyncope, palpitations, abdominal pain, anorexia, nausea, vomiting, diarrhea  or change in bowel habits or change in bladder habits, change in stools or change in urine, dysuria, hematuria,  rash, arthralgias, visual complaints, headache, numbness, weakness or ataxia or problems with walking or coordination,  change in mood or  memory.        Current Meds  Medication Sig  . albuterol (PROAIR HFA) 108 (90 Base) MCG/ACT inhaler 2 puffs every 4 hours as needed for wheezing ((PLANB))  . albuterol (PROVENTIL) (2.5 MG/3ML) 0.083% nebulizer solution USE 1 VIAL IN NEBULIZER EVERY 4 HOURS AS NEEDED (Patient taking differently: Take 2.5 mg by nebulization every 4 (four) hours as needed for wheezing or shortness of breath. USE 1 VIAL IN NEBULIZER EVERY 4 HOURS AS NEEDED)  . Ascorbic Acid (VITAMIN C) 1000 MG tablet Take 1,000 mg by mouth daily.  Marland Kitchen aspirin 81 MG EC tablet Take 81 mg by mouth every morning.   . budesonide-formoterol (SYMBICORT) 160-4.5 MCG/ACT inhaler Inhale 2 puffs into the lungs 2 (two) times daily.  . CHELATED MAGNESIUM PO Take 1 tablet by mouth daily.  . cholecalciferol (VITAMIN D3) 25 MCG (1000 UT) tablet Take 1,000 Units by mouth daily.  . cyanocobalamin  1000 MCG tablet Take 1,000 mcg by mouth daily.  . famotidine (PEPCID) 20 MG tablet Take 20 mg by mouth daily.   Marland Kitchen guaifenesin (HUMIBID E) 400 MG TABS tablet Take 400 mg by mouth every 4 (four) hours as needed.  . Ibuprofen 200 MG CAPS Take 200 mg by mouth every 6 (six) hours as needed (headache/pain).   . meclizine (ANTIVERT) 12.5 MG tablet TAKE 1 TABLET BY MOUTH 4 TIMES A DAY AS NEEDED FOR DIZZINESS  . oxymetazoline (AFRIN) 0.05 % nasal spray Place 1 spray into both nostrils 2 (two) times daily as needed for congestion.  . Polyvinyl Alcohol-Povidone (REFRESH OP) Apply 1 drop to eye as needed (dry eyes).   Marland Kitchen Respiratory Therapy Supplies (FLUTTER) DEVI Use every 4 hours as needed for cough and congestion                     Past Medical Hx ASTHMA (ICD-493.90)  --chronic severe asthma with best FEV1 2.2 L documented 01/2001 and  documented non-adherence Mainly lives off samples of inhalers and nose sprays  - HFA 75% June 15, 2009 > 90% August 02, 2009 > 100% December 27, 2009  - Off allergy vaccines x7/2010 > highly allergic skin tests per Dr Annamaria Boots > restarted vaccine November 06, 2009 > stopped  07/2013  SHOULDER PAIN, RIGHT (ICD-719.41)   HYPERTENSION, BORDERLINE (ICD-401.9)  Shingles T 8 Left 9/01  HEALTH MAINTENANCE...............................................................................................Marland KitchenWert  - Colonoscopy 12/1989,  2011  - dT 07/25/2014  - Pneumovax 02/2003 and 05/2009 - Prevnar 07/25/14 -Flu declined October 17, 2008 and September 21, 2009 , declined flu shot 2015, 2017  -CPX 07/25/2014              Objective:   Physical Exam    amb  bm nad   Vital signs reviewed - Note on arrival 02 sats  95% on RA     05/23/2014  201> 201 07/04/2014 > 07/25/14  196>198 08/22/2014 >194 09/19/2014 >198 10/11/2014 > 10/24/2014  196>   01/02/2015 193 > 03/06/2015  198 >198 04/03/2015 > 05/16/2015 196 >  06/02/2015 196 >197 06/16/2015 > 12/12/2015 199> 197 01/08/2016 >   02/16/2016     194  > 04/08/2016  193> 08/06/2016    196 > 12/31/2016  191 > 01/28/2017  191  > 06/19/2017  192 > 10/24/2017   190 > 02/19/2018  192 > 06/09/2018 197> 08/11/2018    195 > 12/08/2018  192 > 01/05/2019   189 > 02/23/2019   190 > 03/26/2019  190 > 04/19/2019  193      HEENT: nl dentition / oropharynx. Nl external ear canals without cough reflex -  Mild bilateral non-specific turbinate edema     NECK :  without JVD/Nodes/TM/ nl carotid upstrokes bilaterally   LUNGS: no acc muscle use,  Mod barrel  contour chest wall with bilateral  Distant bs faint pan exp wheezes but  without cough on insp or exp maneuver and mod  Hyperresonant  to  percussion bilaterally     CV:  RRR  no s3 or murmur or increase in P2, and no edema   ABD:  soft and nontender with pos mid insp Hoover's  in the supine position. No bruits or organomegaly appreciated, bowel sounds nl  MS:     ext warm without deformities, calf tenderness, cyanosis or clubbing No obvious joint restrictions   SKIN: warm and dry without lesions    NEURO:  alert, approp, nl sensorium with  no motor or cerebellar deficits apparent.

## 2019-06-21 NOTE — Patient Instructions (Addendum)
Work on inhaler technique:  relax and gently blow all the way out then take a nice smooth deep breath back in, triggering the inhaler at same time you start breathing in.  Hold for up to 5 seconds if you can. Blow out thru nose. Rinse and gargle with water when done     No change in medications    Please schedule a follow up office visit in 4 weeks for CPX - you must be fasting for this, sooner if needed  with all medications /inhalers/ solutions in hand so we can verify exactly what you are taking. This includes all medications from all doctors and over the counters Add given 2 new samples of symb 160 and has 45 left on the 2 samples he has

## 2019-06-23 ENCOUNTER — Encounter: Payer: Self-pay | Admitting: Internal Medicine

## 2019-07-02 DIAGNOSIS — M9903 Segmental and somatic dysfunction of lumbar region: Secondary | ICD-10-CM | POA: Diagnosis not present

## 2019-07-02 DIAGNOSIS — M545 Low back pain: Secondary | ICD-10-CM | POA: Diagnosis not present

## 2019-07-02 DIAGNOSIS — M9902 Segmental and somatic dysfunction of thoracic region: Secondary | ICD-10-CM | POA: Diagnosis not present

## 2019-07-02 DIAGNOSIS — M9901 Segmental and somatic dysfunction of cervical region: Secondary | ICD-10-CM | POA: Diagnosis not present

## 2019-07-05 DIAGNOSIS — M9902 Segmental and somatic dysfunction of thoracic region: Secondary | ICD-10-CM | POA: Diagnosis not present

## 2019-07-05 DIAGNOSIS — M9903 Segmental and somatic dysfunction of lumbar region: Secondary | ICD-10-CM | POA: Diagnosis not present

## 2019-07-05 DIAGNOSIS — M9901 Segmental and somatic dysfunction of cervical region: Secondary | ICD-10-CM | POA: Diagnosis not present

## 2019-07-05 DIAGNOSIS — M545 Low back pain: Secondary | ICD-10-CM | POA: Diagnosis not present

## 2019-07-16 DIAGNOSIS — M9903 Segmental and somatic dysfunction of lumbar region: Secondary | ICD-10-CM | POA: Diagnosis not present

## 2019-07-16 DIAGNOSIS — M545 Low back pain: Secondary | ICD-10-CM | POA: Diagnosis not present

## 2019-07-16 DIAGNOSIS — M9902 Segmental and somatic dysfunction of thoracic region: Secondary | ICD-10-CM | POA: Diagnosis not present

## 2019-07-16 DIAGNOSIS — M9901 Segmental and somatic dysfunction of cervical region: Secondary | ICD-10-CM | POA: Diagnosis not present

## 2019-07-20 ENCOUNTER — Ambulatory Visit: Payer: Medicare Other | Admitting: Internal Medicine

## 2019-07-21 ENCOUNTER — Ambulatory Visit (INDEPENDENT_AMBULATORY_CARE_PROVIDER_SITE_OTHER): Payer: Medicare Other

## 2019-07-21 ENCOUNTER — Encounter: Payer: Self-pay | Admitting: Internal Medicine

## 2019-07-21 ENCOUNTER — Other Ambulatory Visit: Payer: Self-pay

## 2019-07-21 ENCOUNTER — Ambulatory Visit (INDEPENDENT_AMBULATORY_CARE_PROVIDER_SITE_OTHER): Payer: Medicare Other | Admitting: Internal Medicine

## 2019-07-21 DIAGNOSIS — J302 Other seasonal allergic rhinitis: Secondary | ICD-10-CM

## 2019-07-21 DIAGNOSIS — N183 Chronic kidney disease, stage 3 unspecified: Secondary | ICD-10-CM

## 2019-07-21 DIAGNOSIS — J3089 Other allergic rhinitis: Secondary | ICD-10-CM | POA: Diagnosis not present

## 2019-07-21 DIAGNOSIS — I10 Essential (primary) hypertension: Secondary | ICD-10-CM

## 2019-07-21 DIAGNOSIS — E7800 Pure hypercholesterolemia, unspecified: Secondary | ICD-10-CM | POA: Diagnosis not present

## 2019-07-21 DIAGNOSIS — J4550 Severe persistent asthma, uncomplicated: Secondary | ICD-10-CM

## 2019-07-21 DIAGNOSIS — R918 Other nonspecific abnormal finding of lung field: Secondary | ICD-10-CM | POA: Diagnosis not present

## 2019-07-21 LAB — URINALYSIS
Bilirubin Urine: NEGATIVE
Ketones, ur: NEGATIVE
Leukocytes,Ua: NEGATIVE
Nitrite: NEGATIVE
Specific Gravity, Urine: 1.02 (ref 1.000–1.030)
Total Protein, Urine: NEGATIVE
Urine Glucose: NEGATIVE
Urobilinogen, UA: 0.2 (ref 0.0–1.0)
pH: 6 (ref 5.0–8.0)

## 2019-07-21 LAB — LIPID PANEL
Cholesterol: 219 mg/dL — ABNORMAL HIGH (ref 0–200)
HDL: 49.7 mg/dL (ref >39.00)
LDL Cholesterol: 137 mg/dL — ABNORMAL HIGH (ref 0–99)
NonHDL: 169.79
Total CHOL/HDL Ratio: 4
Triglycerides: 166 mg/dL — ABNORMAL HIGH (ref 0.0–149.0)
VLDL: 33.2 mg/dL (ref 0.0–40.0)

## 2019-07-21 LAB — CBC WITH DIFFERENTIAL/PLATELET
Basophils Absolute: 0.1 10*3/uL (ref 0.0–0.1)
Basophils Relative: 0.7 % (ref 0.0–3.0)
Eosinophils Absolute: 0.5 10*3/uL (ref 0.0–0.7)
Eosinophils Relative: 6 % — ABNORMAL HIGH (ref 0.0–5.0)
HCT: 45.3 % (ref 39.0–52.0)
Hemoglobin: 14.8 g/dL (ref 13.0–17.0)
Lymphocytes Relative: 36 % (ref 12.0–46.0)
Lymphs Abs: 2.8 10*3/uL (ref 0.7–4.0)
MCHC: 32.6 g/dL (ref 30.0–36.0)
MCV: 90.7 fl (ref 78.0–100.0)
Monocytes Absolute: 0.6 10*3/uL (ref 0.1–1.0)
Monocytes Relative: 8.2 % (ref 3.0–12.0)
Neutro Abs: 3.9 10*3/uL (ref 1.4–7.7)
Neutrophils Relative %: 49.1 % (ref 43.0–77.0)
Platelets: 327 10*3/uL (ref 150.0–400.0)
RBC: 5 Mil/uL (ref 4.22–5.81)
RDW: 13.8 % (ref 11.5–15.5)
WBC: 7.9 10*3/uL (ref 4.0–10.5)

## 2019-07-21 LAB — HEPATIC FUNCTION PANEL
ALT: 16 U/L (ref 0–53)
AST: 19 U/L (ref 0–37)
Albumin: 4.4 g/dL (ref 3.5–5.2)
Alkaline Phosphatase: 61 U/L (ref 39–117)
Bilirubin, Direct: 0.1 mg/dL (ref 0.0–0.3)
Total Bilirubin: 0.9 mg/dL (ref 0.2–1.2)
Total Protein: 7.1 g/dL (ref 6.0–8.3)

## 2019-07-21 LAB — BASIC METABOLIC PANEL
BUN: 11 mg/dL (ref 6–23)
CO2: 27 mEq/L (ref 19–32)
Calcium: 9.6 mg/dL (ref 8.4–10.5)
Chloride: 108 mEq/L (ref 96–112)
Creatinine, Ser: 1.36 mg/dL (ref 0.40–1.50)
GFR: 61.13 mL/min (ref >60.00)
Glucose, Bld: 95 mg/dL (ref 70–99)
Potassium: 4 mEq/L (ref 3.5–5.1)
Sodium: 143 mEq/L (ref 135–145)

## 2019-07-21 LAB — TSH: TSH: 0.93 u[IU]/mL (ref 0.35–4.50)

## 2019-07-21 MED ORDER — BUDESONIDE-FORMOTEROL FUMARATE 160-4.5 MCG/ACT IN AERO: 2.0000 | INHALATION_SPRAY | Freq: Two times a day (BID) | RESPIRATORY_TRACT | 0 refills | Status: DC

## 2019-07-21 NOTE — Progress Notes (Signed)
Subjective:   Patient ID: [name]    DOB: 1941-09-03     Brief patient profile: 17  yobm denies ever smoking but has lifelong asthma with severe chronic pattern and best FEV1 of around 2.2 liters documented 2002 and also documented non-adherence  followed in pulmonary for primary care also for hbp and hyperlipidemia.   History of Present Illness    07/30/13 Allergy eval rx completed x 4 years > d/c'd and f/u allergy prn    04/03/2015 Follow up : chronic asthma/ HTN/non adherence / relies on samples/ no formulary / has med calendar  Pt returns for 1 month follow up .  Doing well with no asthma flare , on Dulera . Marland Kitchen  Need to establish with dentist > multiple teeth extracted   05/06/2016 NP Follow up ; Severe chronic asthma  Patient presents for 1 month follow up and med review We reviewed all his medications organize them into a medication count with patient education Appears he is taking medications correctly. However, does depend on samples. He did not have his Pepcid or Protonix today but says he has them at home. Says overall his breathing is doing well without any flare cough or wheezing. Seen by allergy Dr. Annamaria Boots on 04/23/16   , IgE 203, elevated eos Looking into Nucala.  Prevnar and PVX utd.  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 Is able to sing without sob.  rec No change rx   06/04/16 Dr Annamaria Boots rec Nucala > could not afford deductible       03/26/2019  f/u ov/[name] re:  Chronic asthma/ new BPV better with meclizine / maint on symbicort 160 2bid but sample dep/ Prednisone is plan D but not using  Chief Complaint  Patient presents with  . Hospitalization Follow-up    Dizziness. Patient stated he has not been having any issues with his breathing.  Dyspnea:  Not limited by breathing from desired activities  /very sedentary / nl pace flat surfaces/ shopping ok  Cough: none Sleeping: bed flat  1-2 pillows  SABA use: neb once daily due to allergies but has not  started prednisone yet  02: none  rec Plan A = Automatic = symbicort 160 Take 2 puffs first thing in am and then another 2 puffs about 12 hours later.  Work on Education officer, community B = Backup Plan C = Crisis - only use your albuterol nebulizer if you first try Plan B and it fails to help Plan D = Deltasone  If doing ABC and not improving then prednisone 10 mg x 2 until better then 1 daily x 3 days and stop Plan E = ER - go to ER or call 911 if all else fails     04/19/2019  f/u ov/[name] re: chronic asthma  Chief Complaint  Patient presents with  . Follow-up    Breathing is doing well today. He has not been using his albuterol inhaler or neb.  Dyspnea:  MMRC2 = can't walk a nl pace on a flat grade s sob but does fine slow and flat e Cough: clearing thorat a lot  Sleeping: ok flat/ one pillow SABA use: only has neb, hasn't used in a while  02: none Does not recall last time he needed pred as plan C rec Plan A = Automatic = symbicort 160 Take 2 puffs first thing in am and then another 2 puffs about 12 hours later.  Work on inhaler technique:  Plan B = albuterol inhaler but you don't have one so skip this step Plan C = Crisis - only use your albuterol nebulizer -ok to use the nebulizer up to every 4 hours but if start needing it regularly call for immediate appointment Plan D = Deltasone If need to use the nebulizer more than you do now then take prednsione 10 m x 2 each am until better then 1 daily  Plan E = ER - go to ER or call 911 if all else fails   Please schedule a follow up office visit in 4 weeks, sooner if needed  with all medications /inhalers/ solutions in hand so we can verify exactly what you are taking. This includes all medications from all doctors and over the counters - has 30 day samples of symbicort 160 with about 30 left on present rx     05/21/2019  f/u ov/[name] re: confused again re action plan / the one med he doesn't have on him his his rescue saba  Chief  Complaint  Patient presents with  . Follow-up    Breathing is overall doinwell well. He is using his ventolin about 2 x per day and has not used his neb lately. Needs refills on pepcid and meclizine.   Dyspnea:  MMRC2 = can't walk a nl pace on a flat grade s sob but does fine slow and flat  Cough: none Sleeping: lie flat ok  SABA use: twice daily whether he needs it or not  02: none  rec Plan A = Automatic = symbicort 160 Take 2 puffs first thing in am and then another 2 puffs about 12 hours later.  Work on Orthoptist B = Backup Only use your albuterol inhaler as a rescue medication Plan C = Crisis - only use your albuterol nebulizer if you first try Plan B and it fails to help > ok to use the nebulizer up to every 4 hours but if start needing it regularly call for immediate appointment Plan D = Deltasone If need to use the nebulizer more than you do now then take prednsione 10 m x 2 each am until better then 1 daily  X 5 day and stop  Please schedule a follow up office visit in 4 weeks, sooner if needed  with all medications /inhalers/ solutions in hand so we can verify exactly what you are taking. This includes all medications from all doctors and over the counters   06/21/2019  f/u ov/[name] re:  Total of 45 puffs left in 2 samples (should be close to 0  Chief Complaint  Patient presents with  . Follow-up    Breathing doing well. He rarely uses rescue inhaler or neb.    Dyspnea:  MMRC2 = can't walk a nl pace on a flat grade s sob but does fine slow and flat eg Cough: none Sleeping: flat  Ok/ one pillow SABA use: rarely/no pred since last ov  02: none  No change rx    07/21/2019  f/u ov/[name] re: yearly comprehensive for asthma/ h/o HBP Chief Complaint  Patient presents with  . Annual Exam    fasting..he c/o occ right hip pain  Dyspnea:  MMRC2 = can't walk a nl pace on a flat grade s sob but does fine slow and flat shopping ok  Cough: none Sleeping: flat/ one pillow   SABA use: none  02: none  Has about 30 puffs left in last sample, says has no more at home  No obvious day to day or daytime variability or assoc excess/ purulent sputum or mucus plugs or hemoptysis or cp or chest tightness, subjective wheeze or overt sinus or hb symptoms.   Sleeping as above without nocturnal  or early am exacerbation  of respiratory  c/o's or need for noct saba. Also denies any obvious fluctuation of symptoms with weather or environmental changes or other aggravating or alleviating factors except as outlined above   No unusual exposure hx or h/o childhood pna/ asthma or knowledge of premature birth.  Current Allergies, Complete Past Medical History, Past Surgical History, Family History, and Social History were reviewed in [employer] record.  ROS  The following are not active complaints unless bolded Hoarseness, sore throat, dysphagia, dental problems, itching, sneezing,  nasal congestion or discharge of excess mucus or purulent secretions, ear ache,   fever, chills, sweats, unintended wt loss or wt gain, classically pleuritic or exertional cp,  orthopnea pnd or arm/hand swelling  or leg swelling, presyncope, palpitations, abdominal pain, anorexia, nausea, vomiting, diarrhea  or change in bowel habits or change in bladder habits, change in stools or change in urine, dysuria, hematuria,  rash, arthralgias, visual complaints, headache, numbness, weakness or ataxia or problems with walking or coordination,  change in mood or  memory.        Current Meds  Medication Sig  . albuterol (PROAIR HFA) 108 (90 Base) MCG/ACT inhaler 2 puffs every 4 hours as needed for wheezing ((PLANB))  . albuterol (PROVENTIL) (2.5 MG/3ML) 0.083% nebulizer solution USE 1 VIAL IN NEBULIZER EVERY 4 HOURS AS NEEDED (Patient taking differently: Take 2.5 mg by nebulization every 4 (four) hours as needed for wheezing or shortness of breath. USE 1 VIAL IN NEBULIZER EVERY 4 HOURS AS  NEEDED)  . Ascorbic Acid (VITAMIN C) 1000 MG tablet Take 1,000 mg by mouth daily.  Marland Kitchen aspirin 81 MG EC tablet Take 81 mg by mouth every morning.   . budesonide-formoterol (SYMBICORT) 160-4.5 MCG/ACT inhaler Inhale 2 puffs into the lungs 2 (two) times daily.  . CHELATED MAGNESIUM PO Take 1 tablet by mouth daily.  . cholecalciferol (VITAMIN D3) 25 MCG (1000 UT) tablet Take 1,000 Units by mouth daily.  . cyanocobalamin 1000 MCG tablet Take 1,000 mcg by mouth daily.  . famotidine (PEPCID) 20 MG tablet Take 20 mg by mouth daily.   Marland Kitchen guaifenesin (HUMIBID E) 400 MG TABS tablet Take 400 mg by mouth every 4 (four) hours as needed.  . Ibuprofen 200 MG CAPS Take 200 mg by mouth every 6 (six) hours as needed (headache/pain).   . meclizine (ANTIVERT) 12.5 MG tablet TAKE 1 TABLET BY MOUTH 4 TIMES A DAY AS NEEDED FOR DIZZINESS  . oxymetazoline (AFRIN) 0.05 % nasal spray Place 1 spray into both nostrils 2 (two) times daily as needed for congestion.  . Polyvinyl Alcohol-Povidone (REFRESH OP) Apply 1 drop to eye as needed (dry eyes).   Marland Kitchen Respiratory Therapy Supplies (FLUTTER) DEVI Use every 4 hours as needed for cough and congestion               Past Medical Hx ASTHMA (ICD-493.90)  --chronic severe asthma with best FEV1 2.2 L documented 01/2001 and  documented non-adherence Mainly lives off samples of inhalers and nose sprays  - HFA 75% June 15, 2009 > 90% August 02, 2009 > 100% December 27, 2009  - Off allergy vaccines x7/2010 > highly allergic skin tests per Dr Annamaria Boots > restarted vaccine November 06, 2009 >  stopped  07/2013  SHOULDER PAIN, RIGHT (ICD-719.41)   HYPERTENSION, BORDERLINE (ICD-401.9)  Shingles T 8 Left 9/01  HEALTH MAINTENANCE...............................................................................................Marland KitchenWert  - Colonoscopy 12/1989,  2011  - dT 07/25/2014  - Pneumovax 02/2003 and 05/2009 - Prevnar 07/25/14 -Flu declined October 17, 2008 and September 21, 2009 , declined flu shot  2015, 2017  -CPX 07/21/2019            Objective:   Physical Exam    amb bm nad   Vital signs reviewed - Note on arrival 02 sats  95% on RA   bp 140/80      05/23/2014  201> 201 07/04/2014 > 07/25/14  196>198 08/22/2014 >194 09/19/2014 >198 10/11/2014 > 10/24/2014  196>   01/02/2015 193 > 03/06/2015  198 >198 04/03/2015 > 05/16/2015 196 >  06/02/2015 196 >197 06/16/2015 > 12/12/2015 199> 197 01/08/2016 >   02/16/2016     194 > 04/08/2016  193> 08/06/2016    196 > 12/31/2016  191 > 01/28/2017  191  > 06/19/2017  192 > 10/24/2017   190 > 02/19/2018  192 > 06/09/2018 197> 08/11/2018    195 > 12/08/2018  192 > 01/05/2019   189 > 02/23/2019   190 > 03/26/2019  190 > 04/19/2019  193 > 07/21/2019  194      HEENT: nl dentition / oropharynx. Nl external ear canals without cough reflex -  Mild bilateral non-specific turbinate edema     NECK :  without JVD/Nodes/TM/ nl carotid upstrokes bilaterally   LUNGS: no acc muscle use,  Mod barrel  contour chest wall with bilateral  Distant bs s audible wheeze and  without cough on insp or exp maneuver and mod  Hyperresonant  to  percussion bilaterally     CV:  RRR  no s3 or murmur or increase in P2, and no edema   ABD:  soft and nontender with pos mid insp Hoover's  in the supine position. No bruits or organomegaly appreciated, bowel sounds nl  MS:     ext warm without deformities, calf tenderness, cyanosis or clubbing No obvious joint restrictions   SKIN: warm and dry without lesions    NEURO:  alert, approp, nl sensorium with  no motor or cerebellar deficits apparent.   GU: testes down bilaterally s IH/ uncirc  Rectal : mod bph, no nodules  Stool g neg    Labs ordered/ reviewed:      Chemistry      Component Value Date/Time   NA 143 07/21/2019 1132   K 4.0 07/21/2019 1132   CL 108 07/21/2019 1132   CO2 27 07/21/2019 1132   BUN 11 07/21/2019 1132   CREATININE 1.36 07/21/2019 1132      Component Value Date/Time   CALCIUM 9.6 07/21/2019 1132   ALKPHOS 61  07/21/2019 1132   AST 19 07/21/2019 1132   ALT 16 07/21/2019 1132   BILITOT 0.9 07/21/2019 1132        Lab Results  Component Value Date   WBC 7.9 07/21/2019   HGB 14.8 07/21/2019   HCT 45.3 07/21/2019   MCV 90.7 07/21/2019   PLT 327.0 07/21/2019       EOS  0.5                                    07/21/2019       Lab Results  Component Value Date   TSH 0.93 07/21/2019

## 2019-07-21 NOTE — Patient Instructions (Signed)
Please remember to go to the lab and x-ray department   for your tests - we will call you with the results when they are available.      Please schedule a follow up office visit in 4 weeks, sooner if needed > see NP

## 2019-07-22 NOTE — Progress Notes (Signed)
Spoke with pt and notified of results per Dr. Wert. Pt verbalized understanding and denied any questions. 

## 2019-07-23 ENCOUNTER — Encounter: Payer: Self-pay | Admitting: Internal Medicine

## 2019-07-23 NOTE — Assessment & Plan Note (Signed)
Lifelong pattern  Off allergy vaccines x7/2010 > highly allergic skin tests per Dr Annamaria Boots > restarted vaccine November 06, 2009  Allergy vaccine 1:50:000 10/30/09; adv to 1:10 11/20/10 GH. D/Cd 07/30/13 - allergy profile 12/12/15 >  Eos 0.3 /   IgE  243 multiple pos RAST with dog the worst  - 07/2016 approved for nucala  But could not afford deductible   Adequate control on present rx, reviewed in detail with pt > no change in rx needed  = otcs

## 2019-07-23 NOTE — Assessment & Plan Note (Addendum)
B/p elevated at ov at 160/84 09/05/2011 >>rx benicar 40mg  1/2 daily along w/ lifesytle changes  - 10/24/2017 informed that no longer taking any meds/ "can't afford"> bp ok off rx   Lab Results  Component Value Date   CREATININE 1.36 07/21/2019   CREATININE 1.33 (H) 03/23/2019   CREATININE 1.30 06/19/2017    U/a neg protein  Adequate control on present rx, reviewed in detail with pt > no change in rx needed  = diet

## 2019-07-23 NOTE — Assessment & Plan Note (Signed)
-   Target LDL < 130 due to h/o hbp/ male gender   Lab Results  Component Value Date   CHOL 219 (H) 07/21/2019   HDL 49.70 07/21/2019   LDLCALC 137 (H) 07/21/2019   LDLDIRECT 124.7 04/30/2013   TRIG 166.0 (H) 07/21/2019   CHOLHDL 4 07/21/2019    .Adequate control on present rx, reviewed in detail with pt > no change in rx needed  = diet   I had an extended discussion with the patient reviewing all relevant studies completed to date and  lasting 15 to 20 minutes of a 25 minute visit    Each maintenance medication was reviewed in detail including most importantly the difference between maintenance and prns and under what circumstances the prns are to be triggered using an action plan format that is not reflected in the computer generated alphabetically organized AVS but trather by a customized med calendar that reflects the AVS meds with confirmed 100% correlation.   In addition, Please see AVS for unique instructions that I personally wrote and verbalized to the the pt in detail and then reviewed with pt  by my nurse highlighting any  changes in therapy recommended at today's visit to their plan of care.

## 2019-07-23 NOTE — Assessment & Plan Note (Signed)
Onset in childhood  --chronic severe asthma with best FEV1 2.2 L documented 01/2001 and documented non-adherence Mainly lives off samples of inhalers and nose sprays  - Off allergy vaccines x7/2010 > highly allergic skin tests per Dr Annamaria Boots > restart vaccine November 06, 2009>stop 07/30/13 to watch - Spirometry on "good day" 01/14/12 =  FEV1 1.54  Ratio 0.58  - 02/16/2016 added prn prednisone when needing neb - FENO 04/23/16- 32  -CBC with differential 04/23/2016-eosinophils elevated 7%  - Spirometry 01/28/2017  FEV1 1.23 (49%)  Ratio 60    - Spirometry 08/11/2018  FEV1 1.43 (60%)  Ratio 60  - FENO 08/11/2018  =   30 on symb 160 2bid  - 06/21/2019  After extensive coaching inhaler device,  effectiveness =    75% (short Ti)   He remains sample dependent but relatively well compensated despite severe chronic asthma with remodeling.

## 2019-07-23 NOTE — Assessment & Plan Note (Signed)
Followed in Pulmonary clinic/ [facility]/ Nykole Matos  Onset in childhood  --chronic severe asthma with best FEV1 2.2 L documented 01/2001 and documented non-adherence Mainly lives off samples of inhalers and nose sprays  - Off allergy vaccines x7/2010 > highly allergic skin tests per Dr Annamaria Boots > restart vaccine November 06, 2009>stop 07/30/13 to watch - Spirometry on "good day" 01/14/12 =  FEV1 1.54  Ratio 0.58  - 02/16/2016 added prn prednisone when needing neb - FENO 04/23/16- 32  -CBC with differential 04/23/2016-eosinophils elevated 7%  - Spirometry 01/28/2017  FEV1 1.23 (49%)  Ratio 60    - Spirometry 08/11/2018  FEV1 1.43 (60%)  Ratio 60  - FENO 08/11/2018  =   30 on symb 160 2bid  - 06/21/2019  After extensive coaching inhaler device,  effectiveness =    75% (short Ti)   Despite documented nonadherence he is relatively stable with no need for albuterol or nocturnal symptoms are clear exacerbation since his last visit nor has he needed any prednisone which he uses as plan "D" but remains at very high risk for asthma flare and COVID-19 morbidity for which I advised him extensively.   I had an extended discussion with the patient reviewing all relevant studies completed to date and  lasting 15 to 20 minutes of a 25 minute visit    See device teaching which extended face to face time for this visit.  Each maintenance medication was reviewed in detail including emphasizing most importantly the difference between maintenance and prns and under what circumstances the prns are to be triggered using an action plan format that is not reflected in the computer generated alphabetically organized AVS which I have not found useful in most complex patients, especially with respiratory illnesses  Please see AVS for specific instructions unique to this visit that I personally wrote and verbalized to the the pt in detail and then reviewed with pt  by my nurse highlighting any  changes in therapy recommended at  today's visit to their plan of care.      >>> f/u monthly with continued counting of inhaler use to give him direct feedback regarding adherence.

## 2019-07-28 DIAGNOSIS — M545 Low back pain: Secondary | ICD-10-CM | POA: Diagnosis not present

## 2019-07-28 DIAGNOSIS — M9901 Segmental and somatic dysfunction of cervical region: Secondary | ICD-10-CM | POA: Diagnosis not present

## 2019-08-16 DIAGNOSIS — M9901 Segmental and somatic dysfunction of cervical region: Secondary | ICD-10-CM | POA: Diagnosis not present

## 2019-08-16 DIAGNOSIS — M545 Low back pain: Secondary | ICD-10-CM | POA: Diagnosis not present

## 2019-08-20 DIAGNOSIS — M9901 Segmental and somatic dysfunction of cervical region: Secondary | ICD-10-CM | POA: Diagnosis not present

## 2019-08-20 DIAGNOSIS — M545 Low back pain: Secondary | ICD-10-CM | POA: Diagnosis not present

## 2019-08-23 ENCOUNTER — Ambulatory Visit: Payer: Medicare Other | Admitting: Internal Medicine

## 2019-08-27 ENCOUNTER — Ambulatory Visit (INDEPENDENT_AMBULATORY_CARE_PROVIDER_SITE_OTHER): Payer: Medicare Other | Admitting: Internal Medicine

## 2019-08-27 ENCOUNTER — Encounter: Payer: Self-pay | Admitting: Internal Medicine

## 2019-08-27 ENCOUNTER — Other Ambulatory Visit: Payer: Self-pay

## 2019-08-27 DIAGNOSIS — J3089 Other allergic rhinitis: Secondary | ICD-10-CM

## 2019-08-27 DIAGNOSIS — N183 Chronic kidney disease, stage 3 unspecified: Secondary | ICD-10-CM

## 2019-08-27 DIAGNOSIS — J4550 Severe persistent asthma, uncomplicated: Secondary | ICD-10-CM

## 2019-08-27 DIAGNOSIS — M9903 Segmental and somatic dysfunction of lumbar region: Secondary | ICD-10-CM | POA: Diagnosis not present

## 2019-08-27 DIAGNOSIS — J302 Other seasonal allergic rhinitis: Secondary | ICD-10-CM | POA: Diagnosis not present

## 2019-08-27 DIAGNOSIS — M9902 Segmental and somatic dysfunction of thoracic region: Secondary | ICD-10-CM | POA: Diagnosis not present

## 2019-08-27 DIAGNOSIS — M9901 Segmental and somatic dysfunction of cervical region: Secondary | ICD-10-CM | POA: Diagnosis not present

## 2019-08-27 DIAGNOSIS — M545 Low back pain: Secondary | ICD-10-CM | POA: Diagnosis not present

## 2019-08-27 MED ORDER — BUDESONIDE-FORMOTEROL FUMARATE 160-4.5 MCG/ACT IN AERO: 2.0000 | INHALATION_SPRAY | Freq: Two times a day (BID) | RESPIRATORY_TRACT | 0 refills | Status: DC

## 2019-08-27 NOTE — Assessment & Plan Note (Signed)
Lifelong pattern  Off allergy vaccines x7/2010 > highly allergic skin tests per Dr Annamaria Boots > restarted vaccine November 06, 2009  Allergy vaccine 1:50:000 10/30/09; adv to 1:10 11/20/10 GH. D/Cd 07/30/13 - allergy profile 12/12/15 >  Eos 0.3 /   IgE  243 multiple pos RAST with dog the worst  - 07/2016 approved for nucala  But could not afford deductible   Continue otcs as per med calendar

## 2019-08-27 NOTE — Assessment & Plan Note (Signed)
Lab Results  Component Value Date   CREATININE 1.36 07/21/2019   CREATININE 1.33 (H) 03/23/2019   CREATININE 1.30 06/19/2017     No rx needed

## 2019-08-27 NOTE — Assessment & Plan Note (Signed)
Onset in childhood  --chronic severe asthma with best FEV1 2.2 L documented 01/2001 and documented non-adherence Mainly lives off samples of inhalers and nose sprays  - Off allergy vaccines x7/2010 > highly allergic skin tests per Dr Annamaria Boots > restart vaccine November 06, 2009>stop 07/30/13 to watch - Spirometry on "good day" 01/14/12 =  FEV1 1.54  Ratio 0.58  - 02/16/2016 added prn prednisone when needing neb - FENO 04/23/16- 32  -CBC with differential 04/23/2016-eosinophils elevated 7%  - Spirometry 01/28/2017  FEV1 1.23 (49%)  Ratio 60    - Spirometry 08/11/2018  FEV1 1.43 (60%)  Ratio 60  - FENO 08/11/2018  =   30 on symb 160 2bid  - 08/27/2019  After extensive coaching inhaler device,  effectiveness =    90%    Despite documented non-aderence he is maintaining baseline with no noct symptoms, no increased saba use or need for prednisone as "Plan D" so no change rx needed.  Warned again re M and M assoc with underuse of ICS in chronic asthma   I had an extended discussion with the patient reviewing all relevant studies completed to date and  lasting 15 to 20 minutes of a 25 minute visit    I performed device teaching  using a teach back technique which also  extended face to face time for this visit (see above)   Each maintenance medication was reviewed in detail including most importantly the difference between maintenance and prns and under what circumstances the prns are to be triggered using an action plan format that is not reflected in the computer generated alphabetically organized AVS but trather by a customized med calendar that reflects the AVS meds with confirmed 100% correlation.   In addition, Please see AVS for unique instructions that I personally wrote and verbalized to the the pt in detail and then reviewed with pt  by my nurse highlighting any  changes in therapy recommended at today's visit to their plan of care.

## 2019-08-27 NOTE — Patient Instructions (Addendum)
See calendar for specific medication instructions and bring it back for each and every office visit for every healthcare provider you see.  Without it,  you may not receive the best quality medical care that we feel you deserve.  You will note that the calendar groups together  your maintenance  medications that are timed at particular times of the day.  Think of this as your checklist for what your doctor has instructed you to do until your next evaluation to see what benefit  there is  to staying on a consistent group of medications intended to keep you well.  The other group at the bottom is entirely up to you to use as you see fit  for specific symptoms that may arise between visits that require you to treat them on an as needed basis.  Think of this as your action plan or "what if" list.   Separating the top medications from the bottom group is fundamental to providing you adequate care going forward.     Please schedule a follow up office visit in 4 weeks, sooner if needed  with all medications /inhalers/ solutions in hand so we can verify exactly what you are taking. This includes all medications from all doctors and over the counters - has one more sample of symb at home at 0 on the counter of the one he brought so given second sample and advised to use his symb 2 q 12h prior to brushing teeth which he should also be doing bid

## 2019-08-27 NOTE — Progress Notes (Signed)
Subjective:   Patient ID: [name]    DOB: April 20, 1941     Brief patient profile: 60  yobm denies ever smoking but has lifelong asthma with severe chronic pattern and best FEV1 of around 2.2 liters documented 2002 and also documented non-adherence  followed in pulmonary for primary care also for hbp and hyperlipidemia.   History of Present Illness    07/30/13 Allergy eval rx completed x 4 years > d/c'd and f/u allergy prn    04/03/2015 Follow up : chronic asthma/ HTN/non adherence / relies on samples/ no formulary / has med calendar  Pt returns for 1 month follow up .  Doing well with no asthma flare , on Dulera . Marland Kitchen  Need to establish with dentist > multiple teeth extracted   05/06/2016 NP Follow up ; Severe chronic asthma  Patient presents for 1 month follow up and med review We reviewed all his medications organize them into a medication count with patient education Appears he is taking medications correctly. However, does depend on samples. He did not have his Pepcid or Protonix today but says he has them at home. Says overall his breathing is doing well without any flare cough or wheezing. Seen by allergy Dr. Annamaria Boots on 04/23/16   , IgE 203, elevated eos Looking into Nucala.  Prevnar and PVX utd.  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 Is able to sing without sob.  rec No change rx   06/04/16 Dr Annamaria Boots rec Nucala > could not afford deductible       03/26/2019  f/u ov/Jarmaine Ehrler re:  Chronic asthma/ new BPV better with meclizine / maint on symbicort 160 2bid but sample dep/ Prednisone is plan D but not using  Chief Complaint  Patient presents with  . Hospitalization Follow-up    Dizziness. Patient stated he has not been having any issues with his breathing.  Dyspnea:  Not limited by breathing from desired activities  /very sedentary / nl pace flat surfaces/ shopping ok  Cough: none Sleeping: bed flat  1-2 pillows  SABA use: neb once daily due to allergies but has not  started prednisone yet  02: none  rec Plan A = Automatic = symbicort 160 Take 2 puffs first thing in am and then another 2 puffs about 12 hours later.  Work on Education officer, community B = Backup Plan C = Crisis - only use your albuterol nebulizer if you first try Plan B and it fails to help Plan D = Deltasone  If doing ABC and not improving then prednisone 10 mg x 2 until better then 1 daily x 3 days and stop Plan E = ER - go to ER or call 911 if all else fails     04/19/2019  f/u ov/Yossef Gilkison re: chronic asthma  Chief Complaint  Patient presents with  . Follow-up    Breathing is doing well today. He has not been using his albuterol inhaler or neb.  Dyspnea:  MMRC2 = can't walk a nl pace on a flat grade s sob but does fine slow and flat e Cough: clearing thorat a lot  Sleeping: ok flat/ one pillow SABA use: only has neb, hasn't used in a while  02: none Does not recall last time he needed pred as plan C rec Plan A = Automatic = symbicort 160 Take 2 puffs first thing in am and then another 2 puffs about 12 hours later.  Work on inhaler technique:  Plan B = albuterol inhaler but you don't have one so skip this step Plan C = Crisis - only use your albuterol nebulizer -ok to use the nebulizer up to every 4 hours but if start needing it regularly call for immediate appointment Plan D = Deltasone If need to use the nebulizer more than you do now then take prednsione 10 m x 2 each am until better then 1 daily  Plan E = ER - go to ER or call 911 if all else fails   Please schedule a follow up office visit in 4 weeks, sooner if needed  with all medications /inhalers/ solutions in hand so we can verify exactly what you are taking. This includes all medications from all doctors and over the counters - has 30 day samples of symbicort 160 with about 30 left on present rx     05/21/2019  f/u ov/Makenzey Nanni re: confused again re action plan / the one med he doesn't have on him his his rescue saba  Chief  Complaint  Patient presents with  . Follow-up    Breathing is overall doinwell well. He is using his ventolin about 2 x per day and has not used his neb lately. Needs refills on pepcid and meclizine.   Dyspnea:  MMRC2 = can't walk a nl pace on a flat grade s sob but does fine slow and flat  Cough: none Sleeping: lie flat ok  SABA use: twice daily whether he needs it or not  02: none  rec Plan A = Automatic = symbicort 160 Take 2 puffs first thing in am and then another 2 puffs about 12 hours later.  Work on Orthoptist B = Backup Only use your albuterol inhaler as a rescue medication Plan C = Crisis - only use your albuterol nebulizer if you first try Plan B and it fails to help > ok to use the nebulizer up to every 4 hours but if start needing it regularly call for immediate appointment Plan D = Deltasone If need to use the nebulizer more than you do now then take prednsione 10 m x 2 each am until better then 1 daily  X 5 day and stop  Please schedule a follow up office visit in 4 weeks, sooner if needed  with all medications /inhalers/ solutions in hand so we can verify exactly what you are taking. This includes all medications from all doctors and over the counters   06/21/2019  f/u ov/Lycia Sachdeva re:  Total of 45 puffs left in 2 samples (should be close to 0  Chief Complaint  Patient presents with  . Follow-up    Breathing doing well. He rarely uses rescue inhaler or neb.    Dyspnea:  MMRC2 = can't walk a nl pace on a flat grade s sob but does fine slow and flat eg Cough: none Sleeping: flat  Ok/ one pillow SABA use: rarely/no pred since last ov  02: none  No change rx    07/21/2019  f/u ov/Anadia Helmes re: yearly comprehensive for asthma/ h/o HBP Chief Complaint  Patient presents with  . Annual Exam    fasting..he c/o occ right hip pain  Dyspnea:  MMRC2 = can't walk a nl pace on a flat grade s sob but does fine slow and flat shopping ok  Cough: none Sleeping: flat/ one pillow   SABA use: none  02: none  Has about 30 puffs left in last sample, says has no more at home  rec No change rx   08/27/2019  f/u ov/Jacaden Forbush re: chronic severe asthma/ non-adherence - counts are off by 30 again  from where they should be on symbicort  Plus returns after cpx for f/u CRI s significant hbp off meds  Chief Complaint  Patient presents with  . Follow-up    Breathing is overall doing well.  proair has not used lately nor  neb lately.   Dyspnea:  No change doe = MMRC2 = can't walk a nl pace on a flat grade s sob but does fine slow and flat  Cough: none Sleeping: flat/ one pillow SABA use: minimal 02: none    No obvious day to day or daytime variability or assoc excess/ purulent sputum or mucus plugs or hemoptysis or cp or chest tightness, subjective wheeze or overt sinus or hb symptoms.   Sleeping  without nocturnal  or early am exacerbation  of respiratory  c/o's or need for noct saba. Also denies any obvious fluctuation of symptoms with weather or environmental changes or other aggravating or alleviating factors except as outlined above   No unusual exposure hx or h/o childhood pna or knowledge of premature birth.  Current Allergies, Complete Past Medical History, Past Surgical History, Family History, and Social History were reviewed in [employer] [employer] record.  ROS  The following are not active complaints unless bolded Hoarseness, sore throat, dysphagia, dental problems, itching, sneezing,  nasal congestion or discharge of excess mucus or purulent secretions, ear ache,   fever, chills, sweats, unintended wt loss or wt gain, classically pleuritic or exertional cp,  orthopnea pnd or arm/hand swelling  or leg swelling, presyncope, palpitations, abdominal pain, anorexia, nausea, vomiting, diarrhea  or change in bowel habits or change in bladder habits, change in stools or change in urine, dysuria, hematuria,  rash, arthralgias, visual complaints, headache, numbness,  weakness or ataxia or problems with walking or coordination,  change in mood or  memory.        Current Meds  Medication Sig  . albuterol (PROAIR HFA) 108 (90 Base) MCG/ACT inhaler 2 puffs every 4 hours as needed for wheezing ((PLANB))  . albuterol (PROVENTIL) (2.5 MG/3ML) 0.083% nebulizer solution USE 1 VIAL IN NEBULIZER EVERY 4 HOURS AS NEEDED (Patient taking differently: Take 2.5 mg by nebulization every 4 (four) hours as needed for wheezing or shortness of breath. USE 1 VIAL IN NEBULIZER EVERY 4 HOURS AS NEEDED)  . Ascorbic Acid (VITAMIN C) 1000 MG tablet Take 1,000 mg by mouth daily.  Marland Kitchen aspirin 81 MG EC tablet Take 81 mg by mouth every morning.   .     . budesonide-formoterol (SYMBICORT) 160-4.5 MCG/ACT inhaler Inhale 2 puffs into the lungs 2 (two) times daily.  . CHELATED MAGNESIUM PO Take 1 tablet by mouth daily.  . cholecalciferol (VITAMIN D3) 25 MCG (1000 UT) tablet Take 1,000 Units by mouth daily.  . cyanocobalamin 1000 MCG tablet Take 1,000 mcg by mouth daily.  . famotidine (PEPCID) 20 MG tablet Take 20 mg by mouth daily.   Marland Kitchen guaifenesin (HUMIBID E) 400 MG TABS tablet Take 400 mg by mouth every 4 (four) hours as needed.  . Ibuprofen 200 MG CAPS Take 200 mg by mouth every 6 (six) hours as needed (headache/pain).   . meclizine (ANTIVERT) 12.5 MG tablet TAKE 1 TABLET BY MOUTH 4 TIMES A DAY AS NEEDED FOR DIZZINESS  . oxymetazoline (AFRIN) 0.05 % nasal spray Place 1 spray into both nostrils 2 (two) times daily as needed  for congestion.  . Polyvinyl Alcohol-Povidone (REFRESH OP) Apply 1 drop to eye as needed (dry eyes).   Marland Kitchen Respiratory Therapy Supplies (FLUTTER) DEVI Use every 4 hours as needed for cough and congestion         Past Medical Hx ASTHMA (ICD-493.90)  --chronic severe asthma with best FEV1 2.2 L documented 01/2001 and  documented non-adherence Mainly lives off samples of inhalers and nose sprays  - HFA 75% June 15, 2009 > 90% August 02, 2009 > 100% December 27, 2009  - Off  allergy vaccines x7/2010 > highly allergic skin tests per Dr Annamaria Boots > restarted vaccine November 06, 2009 > stopped  07/2013  SHOULDER PAIN, RIGHT (ICD-719.41)   HYPERTENSION, BORDERLINE (ICD-401.9)  Shingles T 8 Left 08/2000 HEALTH MAINTENANCE...............................................................................................Marland KitchenWert  - Colonoscopy 12/1989,  2011  - dT 07/25/2014  - Pneumovax 02/2003 and 05/2009 - Prevnar 07/25/14 -Flu declined October 17, 2008 and September 21, 2009 , declined flu shot 2015, 2017  -CPX 07/21/2019            Objective:  Physical Exam     amb bm nad     05/23/2014  201> 201 07/04/2014 > 07/25/14  196>198 08/22/2014 >194 09/19/2014 >198 10/11/2014 > 10/24/2014  196>   01/02/2015 193 > 03/06/2015  198 >198 04/03/2015 > 05/16/2015 196 >  06/02/2015 196 >197 06/16/2015 > 12/12/2015 199> 197 01/08/2016 >   02/16/2016     194 > 04/08/2016  193> 08/06/2016    196 > 12/31/2016  191 > 01/28/2017  191  > 06/19/2017  192 > 10/24/2017   190 > 02/19/2018  192 > 06/09/2018 197> 08/11/2018    195 > 12/08/2018  192 > 01/05/2019   189 > 02/23/2019   190 > 03/26/2019  190 > 04/19/2019  193 > 07/21/2019  194 > 08/27/2019  194    Vital signs reviewed - Note on arrival 02 sats  93% on RA and BP 126/74      HEENT : pt wearing mask not removed for exam due to covid - 19 concerns.    NECK :  without JVD/Nodes/TM/ nl carotid upstrokes bilaterally   LUNGS: no acc muscle use,  Mod barrel  contour chest wall with bilateral  Distant bs s audible wheeze and  without cough on insp or exp maneuvers and mod  Hyperresonant  to  percussion bilaterally     CV:  RRR  no s3 or murmur or increase in P2, and no edema   ABD:  soft and nontender with pos mid insp Hoover's  in the supine position. No bruits or organomegaly appreciated, bowel sounds nl  MS:     ext warm without deformities, calf tenderness, cyanosis or clubbing No obvious joint restrictions   SKIN: warm and dry without lesions    NEURO:  alert, approp,  nl sensorium with  no motor or cerebellar deficits apparent.

## 2019-09-03 DIAGNOSIS — M9902 Segmental and somatic dysfunction of thoracic region: Secondary | ICD-10-CM | POA: Diagnosis not present

## 2019-09-03 DIAGNOSIS — M545 Low back pain: Secondary | ICD-10-CM | POA: Diagnosis not present

## 2019-09-03 DIAGNOSIS — M9901 Segmental and somatic dysfunction of cervical region: Secondary | ICD-10-CM | POA: Diagnosis not present

## 2019-09-03 DIAGNOSIS — M9903 Segmental and somatic dysfunction of lumbar region: Secondary | ICD-10-CM | POA: Diagnosis not present

## 2019-09-10 DIAGNOSIS — M546 Pain in thoracic spine: Secondary | ICD-10-CM | POA: Diagnosis not present

## 2019-09-10 DIAGNOSIS — N528 Other male erectile dysfunction: Secondary | ICD-10-CM | POA: Diagnosis not present

## 2019-09-10 DIAGNOSIS — M545 Low back pain: Secondary | ICD-10-CM | POA: Diagnosis not present

## 2019-09-10 DIAGNOSIS — N489 Disorder of penis, unspecified: Secondary | ICD-10-CM | POA: Diagnosis not present

## 2019-09-10 DIAGNOSIS — M9901 Segmental and somatic dysfunction of cervical region: Secondary | ICD-10-CM | POA: Diagnosis not present

## 2019-09-10 DIAGNOSIS — M542 Cervicalgia: Secondary | ICD-10-CM | POA: Diagnosis not present

## 2019-09-22 DIAGNOSIS — M9901 Segmental and somatic dysfunction of cervical region: Secondary | ICD-10-CM | POA: Diagnosis not present

## 2019-09-22 DIAGNOSIS — M542 Cervicalgia: Secondary | ICD-10-CM | POA: Diagnosis not present

## 2019-09-22 DIAGNOSIS — M545 Low back pain: Secondary | ICD-10-CM | POA: Diagnosis not present

## 2019-09-22 DIAGNOSIS — M546 Pain in thoracic spine: Secondary | ICD-10-CM | POA: Diagnosis not present

## 2019-09-27 ENCOUNTER — Ambulatory Visit: Payer: Medicare Other | Admitting: Internal Medicine

## 2019-09-29 ENCOUNTER — Other Ambulatory Visit: Payer: Self-pay

## 2019-09-29 ENCOUNTER — Encounter: Payer: Self-pay | Admitting: Internal Medicine

## 2019-09-29 ENCOUNTER — Ambulatory Visit (INDEPENDENT_AMBULATORY_CARE_PROVIDER_SITE_OTHER): Payer: Medicare Other | Admitting: Internal Medicine

## 2019-09-29 DIAGNOSIS — K219 Gastro-esophageal reflux disease without esophagitis: Secondary | ICD-10-CM | POA: Diagnosis not present

## 2019-09-29 DIAGNOSIS — J302 Other seasonal allergic rhinitis: Secondary | ICD-10-CM

## 2019-09-29 DIAGNOSIS — J4550 Severe persistent asthma, uncomplicated: Secondary | ICD-10-CM

## 2019-09-29 DIAGNOSIS — J3089 Other allergic rhinitis: Secondary | ICD-10-CM | POA: Diagnosis not present

## 2019-09-29 MED ORDER — BUDESONIDE-FORMOTEROL FUMARATE 160-4.5 MCG/ACT IN AERO: 2.0000 | INHALATION_SPRAY | Freq: Two times a day (BID) | RESPIRATORY_TRACT | 0 refills | Status: DC

## 2019-09-29 NOTE — Assessment & Plan Note (Signed)
Onset in childhood  --chronic severe asthma with best FEV1 2.2 L documented 01/2001 and documented non-adherence Mainly lives off samples of inhalers and nose sprays  - Off allergy vaccines x7/2010 > highly allergic skin tests per Dr Annamaria Boots > restart vaccine November 06, 2009>stop 07/30/13 to watch - Spirometry on "good day" 01/14/12 =  FEV1 1.54  Ratio 0.58  - 02/16/2016 added prn prednisone when needing neb - FENO 04/23/16- 32  -CBC with differential 04/23/2016-eosinophils elevated 7%  - Spirometry 01/28/2017  FEV1 1.23 (49%)  Ratio 60    - Spirometry 08/11/2018  FEV1 1.43 (60%)  Ratio 60  - FENO 08/11/2018  =   30 on symb 160 2bid   - 09/29/2019  After extensive coaching inhaler device,  effectiveness =    90% from baseline 75% (short Ti)    Despite documented non-adherence he remains relatively well compensated s flares or need for saba and acts more like a fixed obstruction/ remodeled pt at this point in his life with very limited resources and remains "sample dep" for now    Rec:  No change rx Return for med reconciliation:    To keep things simple, I have asked the patient to first separate medicines that are perceived as maintenance, that is to be taken daily "no matter what", from those medicines that are taken on only on an as-needed basis and I have given the patient examples of both, and then return to see our NP to generate an updated detailed  medication calendar which should be followed until the next physician sees the patient and updates it.

## 2019-09-29 NOTE — Assessment & Plan Note (Signed)
GERD>>endo 03/20/11 -esophageal stricture-s/p dilatation, ulcerated stricture-bx was c/w gerd -neg for barretts. Rec  PPI  - pt stopped h2 on his own 09/29/2019 and no flare of symptoms > ok to take prn symptoms since no Barrett's

## 2019-09-29 NOTE — Patient Instructions (Addendum)
No change medications - ok to leave off pepcid   See calendar for specific medication instructions and bring it back for each and every office visit for every healthcare provider you see.  Without it,  you may not receive the best quality medical care that we feel you deserve.  You will note that the calendar groups together  your maintenance  medications that are timed at particular times of the day.  Think of this as your checklist for what your doctor has instructed you to do until your next evaluation to see what benefit  there is  to staying on a consistent group of medications intended to keep you well.  The other group at the bottom is entirely up to you to use as you see fit  for specific symptoms that may arise between visits that require you to treat them on an as needed basis.  Think of this as your action plan or "what if" list.   Separating the top medications from the bottom group is fundamental to providing you adequate care going forward.     Please schedule a follow up office visit in 4 weeks, sooner if needed  Add: ok to leave off pepcid as not taking it anyway.

## 2019-09-29 NOTE — Progress Notes (Signed)
Subjective:   Patient ID: [name]    DOB: April 20, 1941     Brief patient profile: 60  yobm denies ever smoking but has lifelong asthma with severe chronic pattern and best FEV1 of around 2.2 liters documented 2002 and also documented non-adherence  followed in pulmonary for primary care also for hbp and hyperlipidemia.   History of Present Illness    07/30/13 Allergy eval rx completed x 4 years > d/c'd and f/u allergy prn    04/03/2015 Follow up : chronic asthma/ HTN/non adherence / relies on samples/ no formulary / has med calendar  Pt returns for 1 month follow up .  Doing well with no asthma flare , on Dulera . Marland Kitchen  Need to establish with dentist > multiple teeth extracted   05/06/2016 NP Follow up ; Severe chronic asthma  Patient presents for 1 month follow up and med review We reviewed all his medications organize them into a medication count with patient education Appears he is taking medications correctly. However, does depend on samples. He did not have his Pepcid or Protonix today but says he has them at home. Says overall his breathing is doing well without any flare cough or wheezing. Seen by allergy Dr. Annamaria Boots on 04/23/16   , IgE 203, elevated eos Looking into Nucala.  Prevnar and PVX utd.  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 Is able to sing without sob.  rec No change rx   06/04/16 Dr Annamaria Boots rec Nucala > could not afford deductible       03/26/2019  f/u ov/Allianna Beaubien re:  Chronic asthma/ new BPV better with meclizine / maint on symbicort 160 2bid but sample dep/ Prednisone is plan D but not using  Chief Complaint  Patient presents with  . Hospitalization Follow-up    Dizziness. Patient stated he has not been having any issues with his breathing.  Dyspnea:  Not limited by breathing from desired activities  /very sedentary / nl pace flat surfaces/ shopping ok  Cough: none Sleeping: bed flat  1-2 pillows  SABA use: neb once daily due to allergies but has not  started prednisone yet  02: none  rec Plan A = Automatic = symbicort 160 Take 2 puffs first thing in am and then another 2 puffs about 12 hours later.  Work on Education officer, community B = Backup Plan C = Crisis - only use your albuterol nebulizer if you first try Plan B and it fails to help Plan D = Deltasone  If doing ABC and not improving then prednisone 10 mg x 2 until better then 1 daily x 3 days and stop Plan E = ER - go to ER or call 911 if all else fails     04/19/2019  f/u ov/Torin Modica re: chronic asthma  Chief Complaint  Patient presents with  . Follow-up    Breathing is doing well today. He has not been using his albuterol inhaler or neb.  Dyspnea:  MMRC2 = can't walk a nl pace on a flat grade s sob but does fine slow and flat e Cough: clearing thorat a lot  Sleeping: ok flat/ one pillow SABA use: only has neb, hasn't used in a while  02: none Does not recall last time he needed pred as plan C rec Plan A = Automatic = symbicort 160 Take 2 puffs first thing in am and then another 2 puffs about 12 hours later.  Work on inhaler technique:  Plan B = albuterol inhaler but you don't have one so skip this step Plan C = Crisis - only use your albuterol nebulizer -ok to use the nebulizer up to every 4 hours but if start needing it regularly call for immediate appointment Plan D = Deltasone If need to use the nebulizer more than you do now then take prednsione 10 m x 2 each am until better then 1 daily  Plan E = ER - go to ER or call 911 if all else fails   Please schedule a follow up office visit in 4 weeks, sooner if needed  with all medications /inhalers/ solutions in hand so we can verify exactly what you are taking. This includes all medications from all doctors and over the counters - has 30 day samples of symbicort 160 with about 30 left on present rx     05/21/2019  f/u ov/Sirinity Outland re: confused again re action plan / the one med he doesn't have on him his his rescue saba  Chief  Complaint  Patient presents with  . Follow-up    Breathing is overall doinwell well. He is using his ventolin about 2 x per day and has not used his neb lately. Needs refills on pepcid and meclizine.   Dyspnea:  MMRC2 = can't walk a nl pace on a flat grade s sob but does fine slow and flat  Cough: none Sleeping: lie flat ok  SABA use: twice daily whether he needs it or not  02: none  rec Plan A = Automatic = symbicort 160 Take 2 puffs first thing in am and then another 2 puffs about 12 hours later.  Work on Orthoptist B = Backup Only use your albuterol inhaler as a rescue medication Plan C = Crisis - only use your albuterol nebulizer if you first try Plan B and it fails to help > ok to use the nebulizer up to every 4 hours but if start needing it regularly call for immediate appointment Plan D = Deltasone If need to use the nebulizer more than you do now then take prednsione 10 m x 2 each am until better then 1 daily  X 5 day and stop  Please schedule a follow up office visit in 4 weeks, sooner if needed  with all medications /inhalers/ solutions in hand so we can verify exactly what you are taking. This includes all medications from all doctors and over the counters      09/29/2019  f/u ov/Taran Haynesworth re: chronic severe asthma/ counts off by 30 again Chief Complaint  Patient presents with  . Follow-up    Breathing is doing well and he denies any new co's. He has not had to use his pred or albuterol inhaler or neb.   Dyspnea:  MMRC2 = can't walk a nl pace on a flat grade s sob but does fine slow and flat  Cough: none  Sleeping: fine flat  SABA use: none / also no recent need for short term prednisone 02: no   No obvious day to day or daytime variability or assoc excess/ purulent sputum or mucus plugs or hemoptysis or cp or chest tightness, subjective wheeze or overt sinus or hb symptoms.   Sleeping  without nocturnal  or early am exacerbation  of respiratory  c/o's or need  for noct saba. Also denies any obvious fluctuation of symptoms with weather or environmental changes or other aggravating or alleviating factors except as outlined above   No  unusual exposure hx or h/o childhood pna/ asthma or knowledge of premature birth.  Current Allergies, Complete Past Medical History, Past Surgical History, Family History, and Social History were reviewed in [employer] record.  ROS  The following are not active complaints unless bolded Hoarseness, sore throat, dysphagia, dental problems, itching, sneezing,  nasal congestion or discharge of excess mucus or purulent secretions, ear ache,   fever, chills, sweats, unintended wt loss or wt gain, classically pleuritic or exertional cp,  orthopnea pnd or arm/hand swelling  or leg swelling, presyncope, palpitations, abdominal pain, anorexia, nausea, vomiting, diarrhea  or change in bowel habits or change in bladder habits, change in stools or change in urine, dysuria, hematuria,  rash, arthralgias, visual complaints, headache, numbness, weakness or ataxia or problems with walking or coordination,  change in mood or  memory.        Current Meds  Medication Sig  . albuterol (PROAIR HFA) 108 (90 Base) MCG/ACT inhaler 2 puffs every 4 hours as needed for wheezing ((PLANB))  . albuterol (PROVENTIL) (2.5 MG/3ML) 0.083% nebulizer solution USE 1 VIAL IN NEBULIZER EVERY 4 HOURS AS NEEDED (Patient taking differently: Take 2.5 mg by nebulization every 4 (four) hours as needed for wheezing or shortness of breath. USE 1 VIAL IN NEBULIZER EVERY 4 HOURS AS NEEDED)  . Ascorbic Acid (VITAMIN C) 1000 MG tablet Take 1,000 mg by mouth daily.  Marland Kitchen aspirin 81 MG EC tablet Take 81 mg by mouth every morning.   . budesonide-formoterol (SYMBICORT) 160-4.5 MCG/ACT inhaler Inhale 2 puffs into the lungs 2 (two) times daily.  . CHELATED MAGNESIUM PO Take 1 tablet by mouth daily.  . cholecalciferol (VITAMIN D3) 25 MCG (1000 UT) tablet Take  1,000 Units by mouth daily.  . cyanocobalamin 1000 MCG tablet Take 1,000 mcg by mouth daily.  Marland Kitchen guaifenesin (HUMIBID E) 400 MG TABS tablet Take 400 mg by mouth every 4 (four) hours as needed.  . Ibuprofen 200 MG CAPS Take 200 mg by mouth every 6 (six) hours as needed (headache/pain).   . meclizine (ANTIVERT) 12.5 MG tablet TAKE 1 TABLET BY MOUTH 4 TIMES A DAY AS NEEDED FOR DIZZINESS  . oxymetazoline (AFRIN) 0.05 % nasal spray Place 1 spray into both nostrils 2 (two) times daily as needed for congestion.  . Polyvinyl Alcohol-Povidone (REFRESH OP) Apply 1 drop to eye as needed (dry eyes).   Marland Kitchen Respiratory Therapy Supplies (FLUTTER) DEVI Use every 4 hours as needed for cough and congestion              Past Medical Hx ASTHMA (ICD-493.90)  --chronic severe asthma with best FEV1 2.2 L documented 01/2001 and  documented non-adherence Mainly lives off samples of inhalers and nose sprays  - HFA 75% June 15, 2009 > 90% August 02, 2009 > 100% December 27, 2009  - Off allergy vaccines x7/2010 > highly allergic skin tests per Dr Annamaria Boots > restarted vaccine November 06, 2009 > stopped  07/2013  SHOULDER PAIN, RIGHT (ICD-719.41)   HYPERTENSION, BORDERLINE (ICD-401.9)  Shingles T 8 Left 08/2000 HEALTH MAINTENANCE...............................................................................................Marland KitchenWert  - Colonoscopy 12/1989,  2011  - dT 07/25/2014  - Pneumovax 02/2003 and 05/2009 - Prevnar 07/25/14 -Flu declined October 17, 2008 and September 21, 2009 , declined flu shot 2015, 2017  -CPX 07/21/2019            Objective:  Physical Exam     amb bm nad     05/23/2014  201> 201 07/04/2014 > 07/25/14  196>198  08/22/2014 >194 09/19/2014 >198 10/11/2014 > 10/24/2014  196>   01/02/2015 193 > 03/06/2015  198 >198 04/03/2015 > 05/16/2015 196 >  06/02/2015 196 >197 06/16/2015 > 12/12/2015 199> 197 01/08/2016 >   02/16/2016     194 > 04/08/2016  193> 08/06/2016    196 > 12/31/2016  191 > 01/28/2017  191  > 06/19/2017  192 >  10/24/2017   190 > 02/19/2018  192 > 06/09/2018 197> 08/11/2018    195 > 12/08/2018  192 > 01/05/2019   189 > 02/23/2019   190 > 03/26/2019  190 > 04/19/2019  193 > 07/21/2019  194 > 08/27/2019  194 > 09/29/2019  194    Vital signs reviewed - Note on arrival 02 sats  95% on RA     HEENT : pt wearing mask not removed for exam due to covid - 19 concerns.    NECK :  without JVD/Nodes/TM/ nl carotid upstrokes bilaterally   LUNGS: no acc muscle use,  Mod barrel  contour chest wall with bilateral  Distant bs s audible wheeze and  without cough on insp or exp maneuvers and mod  Hyperresonant  to  percussion bilaterally     CV:  RRR  no s3 or murmur or increase in P2, and no edema   ABD:  soft and nontender with pos mid insp Hoover's  in the supine position. No bruits or organomegaly appreciated, bowel sounds nl  MS:     ext warm without deformities, calf tenderness, cyanosis or clubbing No obvious joint restrictions   SKIN: warm and dry without lesions    NEURO:  alert, approp, nl sensorium with  no motor or cerebellar deficits apparent.

## 2019-09-29 NOTE — Assessment & Plan Note (Signed)
Lifelong pattern  Off allergy vaccines x7/2010 > highly allergic skin tests per Dr Annamaria Boots > restarted vaccine November 06, 2009  Allergy vaccine 1:50:000 10/30/09; adv to 1:10 11/20/10 GH. D/Cd 07/30/13 - allergy profile 12/12/15 >  Eos 0.3 /   IgE  243 multiple pos RAST with dog the worst  - 07/2016 approved for nucala  But could not afford deductible   ? Overusing flonase, advised : I emphasized that nasal steroids have no immediate benefit in terms of improving symptoms.  To help them reached the target tissue, the patient should use Afrin two puffs every 12 hours applied one min before using the nasal steroids.  Afrin should be stopped after no more than 5 days.  If the symptoms worsen, Afrin can be restarted after 5 days off of therapy to prevent rebound congestion from overuse of Afrin.  I also emphasized that in no way are nasal steroids a concern in terms of "addiction".    I had an extended discussion with the patient reviewing all relevant studies completed to date and  lasting 15 to 20 minutes of a 25 minute visit    I performed device teaching  using a teach back technique which also  extended face to face time for this visit (see above)   Each maintenance medication was reviewed in detail including most importantly the difference between maintenance and prns and under what circumstances the prns are to be triggered using an action plan format that is not reflected in the computer generated alphabetically organized AVS but trather by a customized med calendar that reflects the AVS meds with confirmed 100% correlation.   In addition, Please see AVS for unique instructions that I personally wrote and verbalized to the the pt in detail and then reviewed with pt  by my nurse highlighting any  changes in therapy recommended at today's visit to their plan of care.

## 2019-10-01 DIAGNOSIS — M9901 Segmental and somatic dysfunction of cervical region: Secondary | ICD-10-CM | POA: Diagnosis not present

## 2019-10-01 DIAGNOSIS — M546 Pain in thoracic spine: Secondary | ICD-10-CM | POA: Diagnosis not present

## 2019-10-01 DIAGNOSIS — M542 Cervicalgia: Secondary | ICD-10-CM | POA: Diagnosis not present

## 2019-10-01 DIAGNOSIS — M545 Low back pain: Secondary | ICD-10-CM | POA: Diagnosis not present

## 2019-10-08 DIAGNOSIS — M546 Pain in thoracic spine: Secondary | ICD-10-CM | POA: Diagnosis not present

## 2019-10-08 DIAGNOSIS — M9901 Segmental and somatic dysfunction of cervical region: Secondary | ICD-10-CM | POA: Diagnosis not present

## 2019-10-08 DIAGNOSIS — M545 Low back pain: Secondary | ICD-10-CM | POA: Diagnosis not present

## 2019-10-08 DIAGNOSIS — M542 Cervicalgia: Secondary | ICD-10-CM | POA: Diagnosis not present

## 2019-10-18 DIAGNOSIS — M545 Low back pain: Secondary | ICD-10-CM | POA: Diagnosis not present

## 2019-10-18 DIAGNOSIS — M546 Pain in thoracic spine: Secondary | ICD-10-CM | POA: Diagnosis not present

## 2019-10-18 DIAGNOSIS — M542 Cervicalgia: Secondary | ICD-10-CM | POA: Diagnosis not present

## 2019-10-18 DIAGNOSIS — M9901 Segmental and somatic dysfunction of cervical region: Secondary | ICD-10-CM | POA: Diagnosis not present

## 2019-10-21 DIAGNOSIS — M546 Pain in thoracic spine: Secondary | ICD-10-CM | POA: Diagnosis not present

## 2019-10-21 DIAGNOSIS — M545 Low back pain: Secondary | ICD-10-CM | POA: Diagnosis not present

## 2019-10-21 DIAGNOSIS — M542 Cervicalgia: Secondary | ICD-10-CM | POA: Diagnosis not present

## 2019-10-21 DIAGNOSIS — M9901 Segmental and somatic dysfunction of cervical region: Secondary | ICD-10-CM | POA: Diagnosis not present

## 2019-10-27 DIAGNOSIS — M9901 Segmental and somatic dysfunction of cervical region: Secondary | ICD-10-CM | POA: Diagnosis not present

## 2019-10-27 DIAGNOSIS — M546 Pain in thoracic spine: Secondary | ICD-10-CM | POA: Diagnosis not present

## 2019-10-27 DIAGNOSIS — M545 Low back pain: Secondary | ICD-10-CM | POA: Diagnosis not present

## 2019-10-27 DIAGNOSIS — M542 Cervicalgia: Secondary | ICD-10-CM | POA: Diagnosis not present

## 2019-10-29 DIAGNOSIS — M9902 Segmental and somatic dysfunction of thoracic region: Secondary | ICD-10-CM | POA: Diagnosis not present

## 2019-10-29 DIAGNOSIS — M9903 Segmental and somatic dysfunction of lumbar region: Secondary | ICD-10-CM | POA: Diagnosis not present

## 2019-10-29 DIAGNOSIS — M9901 Segmental and somatic dysfunction of cervical region: Secondary | ICD-10-CM | POA: Diagnosis not present

## 2019-10-29 DIAGNOSIS — M545 Low back pain: Secondary | ICD-10-CM | POA: Diagnosis not present

## 2019-11-01 ENCOUNTER — Encounter: Payer: Self-pay | Admitting: Adult Health

## 2019-11-01 ENCOUNTER — Other Ambulatory Visit: Payer: Self-pay

## 2019-11-01 ENCOUNTER — Ambulatory Visit (INDEPENDENT_AMBULATORY_CARE_PROVIDER_SITE_OTHER): Payer: Medicare Other | Admitting: Adult Health

## 2019-11-01 DIAGNOSIS — E7800 Pure hypercholesterolemia, unspecified: Secondary | ICD-10-CM

## 2019-11-01 DIAGNOSIS — J302 Other seasonal allergic rhinitis: Secondary | ICD-10-CM

## 2019-11-01 DIAGNOSIS — J4550 Severe persistent asthma, uncomplicated: Secondary | ICD-10-CM

## 2019-11-01 DIAGNOSIS — I10 Essential (primary) hypertension: Secondary | ICD-10-CM | POA: Diagnosis not present

## 2019-11-01 DIAGNOSIS — J3089 Other allergic rhinitis: Secondary | ICD-10-CM

## 2019-11-01 MED ORDER — BUDESONIDE-FORMOTEROL FUMARATE 160-4.5 MCG/ACT IN AERO: 2.0000 | INHALATION_SPRAY | Freq: Two times a day (BID) | RESPIRATORY_TRACT | 0 refills | Status: DC

## 2019-11-01 NOTE — Assessment & Plan Note (Signed)
Currently under good control. Patient's medications were reviewed today and patient education was given. Computerized medication calendar was adjusted/completed  Patient assistance for Symbicort began .   Plan  Patient Instructions  Continue on Symbicort 2 puffs Twice daily  , rinse after use.  Symbicort patient assistance paperwork .  Follow medication calendar closely and bring to each visit.  Continue on current regimen .  Low fat cholesterol diet.  Low salt diet.  Follow up Dr. Melvyn Novas in 3 months and As needed

## 2019-11-01 NOTE — Assessment & Plan Note (Signed)
Under good control continue on current regimen 

## 2019-11-01 NOTE — Addendum Note (Signed)
Addended by: Len Blalock on: 11/01/2019 10:36 AM   Modules accepted: Orders

## 2019-11-01 NOTE — Patient Instructions (Addendum)
Continue on Symbicort 2 puffs Twice daily  , rinse after use.  Symbicort patient assistance paperwork .  Follow medication calendar closely and bring to each visit.  Continue on current regimen .  Low fat cholesterol diet.  Low salt diet.  Follow up Dr. Melvyn Novas in 3 months and As needed

## 2019-11-01 NOTE — Assessment & Plan Note (Signed)
Currently stable.  Continue on a low-salt diet.

## 2019-11-01 NOTE — Assessment & Plan Note (Signed)
Continue on a low-fat cholesterol diet

## 2019-11-01 NOTE — Progress Notes (Signed)
@Patient  ID: [name], male    DOB: 1941/03/17, 78 y.o.   MRN: [id number]  Chief Complaint  Patient presents with  . Follow-up    Asthma     Referring provider: Tanda Rockers, MD  HPI: 78 year old male never smoker followed for severe chronic asthma.  Patient is a primary care patient of Dr. Melvyn Novas followed for hypertension and hyperlipidemia Sings gospel music Had twin sister that died from a brain tumor Divorced with kids in [place]  TEST/EVENTS :  --chronic severe asthma with best FEV1 2.2 L documented 01/2001 and documented non-adherence Mainly lives off samples of inhalers and nose sprays - Off allergy vaccines x7/2010 >highly allergic skin tests per Dr Annamaria Boots >restart vaccine November 06, 2009>stop 07/30/13 to watch - Spirometry on "good day" 01/14/12 = FEV1 1.54 Ratio 0.58  - 2/24/2017added prn prednisone when needing neb - FENO 04/23/16- 32  -CBC with differential 04/23/2016-eosinophils elevated 7.090%  - Spirometry 2/6/2018FEV1 1.23 (49%) Ratio 60  -Colon 2016 - no further colons needed.  -PVX (2010) , Prevnar (2015) , TDAP 2015)  -declines flu shot .  11/01/2019 Follow up : Chronic Asthma , HTN  Patient presents for a 1 month follow-up.  He has underlying chronic asthma.  He remains on Symbicort twice daily.  Since last visit says breathing is doing okay .  Chest x-ray July 2020 showed no acute process.  Mild bibasilar atelectasis. Walks daily . No cough or wheezing .   Patient has borderline hypertension.  Is currently not on any medications.  He prefers no medicines if possible. We discussed low sat and fat diet .   Patient brought all of his medications in today for review.  We updated his medication calendar with patient education given. Finances are limited and can not afford co-pays. Also does not look like he has Medicare D (rx coverage) .  Depends on samples .    No Known Allergies  Immunization History  Administered Date(s) Administered  .  Pneumococcal Conjugate-13 07/25/2014  . Pneumococcal Polysaccharide-23 12/23/2008  . Tdap 07/25/2014    Past Medical History:  Diagnosis Date  . Acid reflux   . Asthma 06/18/2011  . Borderline hypertension   . Cataract   . Colon polyps 2011   1 Hyperplastic and 1 Tubular Adenoma   . Dysphagia   . Esophageal stricture   . GERD (gastroesophageal reflux disease)   . Hx of adenomatous colonic polyps 06/25/2015  . Personal history of colonic polyps 12/1989  . Shingles   . Shoulder pain, right   . Unspecified asthma(493.90)   . Urinary frequency     Tobacco History: Social History   Tobacco Use  Smoking Status Never Smoker  Smokeless Tobacco Never Used   Counseling given: Not Answered   Outpatient Medications Prior to Visit  Medication Sig Dispense Refill  . albuterol (PROAIR HFA) 108 (90 Base) MCG/ACT inhaler 2 puffs every 4 hours as needed for wheezing ((PLANB)) 1 Inhaler 5  . albuterol (PROVENTIL) (2.5 MG/3ML) 0.083% nebulizer solution USE 1 VIAL IN NEBULIZER EVERY 4 HOURS AS NEEDED (Patient taking differently: Take 2.5 mg by nebulization every 4 (four) hours as needed for wheezing or shortness of breath. USE 1 VIAL IN NEBULIZER EVERY 4 HOURS AS NEEDED) 75 mL 3  . Ascorbic Acid (VITAMIN C) 1000 MG tablet Take 1,000 mg by mouth daily.    Marland Kitchen aspirin 81 MG EC tablet Take 81 mg by mouth every morning.     . budesonide-formoterol (SYMBICORT)  160-4.5 MCG/ACT inhaler Inhale 2 puffs into the lungs 2 (two) times daily. 1 Inhaler 0  . CHELATED MAGNESIUM PO Take 1 tablet by mouth daily.    . cholecalciferol (VITAMIN D3) 25 MCG (1000 UT) tablet Take 1,000 Units by mouth daily.    . cyanocobalamin 1000 MCG tablet Take 1,000 mcg by mouth daily.    . famotidine (PEPCID) 20 MG tablet Take 20 mg by mouth daily.     Marland Kitchen guaifenesin (HUMIBID E) 400 MG TABS tablet Take 400 mg by mouth every 4 (four) hours as needed.    . Ibuprofen 200 MG CAPS Take 200 mg by mouth every 6 (six) hours as needed  (headache/pain).     . meclizine (ANTIVERT) 12.5 MG tablet TAKE 1 TABLET BY MOUTH 4 TIMES A DAY AS NEEDED FOR DIZZINESS 40 tablet 5  . oxymetazoline (AFRIN) 0.05 % nasal spray Place 1 spray into both nostrils 2 (two) times daily as needed for congestion.    . Polyvinyl Alcohol-Povidone (REFRESH OP) Apply 1 drop to eye as needed (dry eyes).     . predniSONE (DELTASONE) 10 MG tablet 2 daily until better, then 1 x 3 days and stop (Patient not taking: Reported on 09/29/2019) 100 tablet 5  . Respiratory Therapy Supplies (FLUTTER) DEVI Use every 4 hours as needed for cough and congestion     No facility-administered medications prior to visit.      Review of Systems:   Constitutional:   No  weight loss, night sweats,  Fevers, chills, + fatigue, or  lassitude.  HEENT:   No headaches,  Difficulty swallowing,  Tooth/dental problems, or  Sore throat,                No sneezing, itching, ear ache, nasal congestion, post nasal drip,   CV:  No chest pain,  Orthopnea, PND, swelling in lower extremities, anasarca, dizziness, palpitations, syncope.   GI  No heartburn, indigestion, abdominal pain, nausea, vomiting, diarrhea, change in bowel habits, loss of appetite, bloody stools.   Resp:  No excess mucus, no productive cough,  No non-productive cough,  No coughing up of blood.  No change in color of mucus.  No wheezing.  No chest wall deformity  Skin: no rash or lesions.  GU: no dysuria, change in color of urine, no urgency or frequency.  No flank pain, no hematuria   MS:  No joint pain or swelling.  No decreased range of motion.  No back pain.    Physical Exam  BP (!) 142/70 (BP Location: Left Arm, Cuff Size: Normal)   Pulse 80   Temp 97.9 F (36.6 C) (Temporal)   Ht 5\' 6"  (1.676 m)   Wt 195 lb 6.4 oz (88.6 kg)   SpO2 97%   BMI 31.54 kg/m   GEN: A/Ox3; pleasant , NAD, well nourished    HEENT:  [place]/AT,   NOSE-clear, THROAT-clear, no lesions, no postnasal drip or exudate noted.  Poor  dentition   NECK:  Supple w/ fair ROM; no JVD; normal carotid impulses w/o bruits; no thyromegaly or nodules palpated; no lymphadenopathy.    RESP  Clear  P & A; w/o, wheezes/ rales/ or rhonchi. no accessory muscle use, no dullness to percussion  CARD:  RRR, no m/r/g, no peripheral edema, pulses intact, no cyanosis or clubbing.  GI:   Soft & nt; nml bowel sounds; no organomegaly or masses detected.   Musco: Warm bil, no deformities or joint swelling noted.   Neuro: alert,  no focal deficits noted.    Skin: Warm, no lesions or rashes    Lab Results:  CBC    Component Value Date/Time   WBC 7.9 07/21/2019 1132   RBC 5.00 07/21/2019 1132   HGB 14.8 07/21/2019 1132   HCT 45.3 07/21/2019 1132   PLT 327.0 07/21/2019 1132   MCV 90.7 07/21/2019 1132   MCH 29.7 03/23/2019 0621   MCHC 32.6 07/21/2019 1132   RDW 13.8 07/21/2019 1132   LYMPHSABS 2.8 07/21/2019 1132   MONOABS 0.6 07/21/2019 1132   EOSABS 0.5 07/21/2019 1132   BASOSABS 0.1 07/21/2019 1132    BMET    Component Value Date/Time   NA 143 07/21/2019 1132   K 4.0 07/21/2019 1132   CL 108 07/21/2019 1132   CO2 27 07/21/2019 1132   GLUCOSE 95 07/21/2019 1132   BUN 11 07/21/2019 1132   CREATININE 1.36 07/21/2019 1132   CALCIUM 9.6 07/21/2019 1132   GFRNONAA 51 (L) 03/23/2019 0621   GFRAA 59 (L) 03/23/2019 0621    BNP No results found for: BNP  ProBNP No results found for: PROBNP  Imaging: No results found.    No flowsheet data found.  Lab Results  Component Value Date   NITRICOXIDE 32 04/23/2016        Assessment & Plan:   Severe persistent asthma Currently under good control. Patient's medications were reviewed today and patient education was given. Computerized medication calendar was adjusted/completed  Patient assistance for Symbicort began .   Plan  Patient Instructions  Continue on Symbicort 2 puffs Twice daily  , rinse after use.  Symbicort patient assistance paperwork .  Follow  medication calendar closely and bring to each visit.  Continue on current regimen .  Low fat cholesterol diet.  Low salt diet.  Follow up Dr. Melvyn Novas in 3 months and As needed       Seasonal and perennial allergic rhinitis Under good control continue on current regimen  Essential hypertension, benign Currently stable.  Continue on a low-salt diet.  Hyperlipidemia Continue on a low-fat cholesterol diet     Rexene Edison, NP 11/01/2019

## 2019-11-02 NOTE — Progress Notes (Signed)
Chart and office note reviewed in detail  > agree with a/p as outlined    

## 2019-11-12 ENCOUNTER — Other Ambulatory Visit: Payer: Self-pay | Admitting: Internal Medicine

## 2019-11-17 DIAGNOSIS — M9901 Segmental and somatic dysfunction of cervical region: Secondary | ICD-10-CM | POA: Diagnosis not present

## 2019-11-17 DIAGNOSIS — M9902 Segmental and somatic dysfunction of thoracic region: Secondary | ICD-10-CM | POA: Diagnosis not present

## 2019-11-17 DIAGNOSIS — M9903 Segmental and somatic dysfunction of lumbar region: Secondary | ICD-10-CM | POA: Diagnosis not present

## 2019-11-26 DIAGNOSIS — M9903 Segmental and somatic dysfunction of lumbar region: Secondary | ICD-10-CM | POA: Diagnosis not present

## 2019-11-26 DIAGNOSIS — M9901 Segmental and somatic dysfunction of cervical region: Secondary | ICD-10-CM | POA: Diagnosis not present

## 2019-11-26 DIAGNOSIS — M9902 Segmental and somatic dysfunction of thoracic region: Secondary | ICD-10-CM | POA: Diagnosis not present

## 2020-01-14 DIAGNOSIS — M9903 Segmental and somatic dysfunction of lumbar region: Secondary | ICD-10-CM | POA: Diagnosis not present

## 2020-01-14 DIAGNOSIS — M9901 Segmental and somatic dysfunction of cervical region: Secondary | ICD-10-CM | POA: Diagnosis not present

## 2020-01-14 DIAGNOSIS — M9904 Segmental and somatic dysfunction of sacral region: Secondary | ICD-10-CM | POA: Diagnosis not present

## 2020-01-14 DIAGNOSIS — M9902 Segmental and somatic dysfunction of thoracic region: Secondary | ICD-10-CM | POA: Diagnosis not present

## 2020-01-25 ENCOUNTER — Other Ambulatory Visit: Payer: Self-pay

## 2020-01-25 ENCOUNTER — Encounter: Payer: Self-pay | Admitting: Internal Medicine

## 2020-01-25 ENCOUNTER — Ambulatory Visit (INDEPENDENT_AMBULATORY_CARE_PROVIDER_SITE_OTHER): Payer: Medicare Other | Admitting: Internal Medicine

## 2020-01-25 DIAGNOSIS — J4550 Severe persistent asthma, uncomplicated: Secondary | ICD-10-CM

## 2020-01-25 DIAGNOSIS — L02234 Carbuncle of groin: Secondary | ICD-10-CM

## 2020-01-25 MED ORDER — CEFDINIR 300 MG PO CAPS: 300.0000 mg | ORAL_CAPSULE | Freq: Two times a day (BID) | ORAL | 0 refills | Status: DC

## 2020-01-25 MED ORDER — BUDESONIDE-FORMOTEROL FUMARATE 160-4.5 MCG/ACT IN AERO: INHALATION_SPRAY | RESPIRATORY_TRACT | 12 refills | Status: DC

## 2020-01-25 NOTE — Patient Instructions (Addendum)
Apply warm soaks to Left groin  And omnicef 300 mg twice daily x 10 days   See [name] w/in 2 weeks with all your med ications and your drug formulary = list of cheapest medications you can purchase under your Medicare Part D    Without this process, it simply isn't possible to assure that we are providing  your outpatient care  with  the attention to detail we feel you deserve.   If we cannot assure that you're getting that kind of care,  then we cannot manage your problem effectively from this clinic.  Once you have seen Tammy and we are sure that we're all on the same page with your medication use she will arrange follow up with me.

## 2020-01-25 NOTE — Progress Notes (Signed)
Subjective:   Patient ID: [name]    DOB: 1941/01/25     Brief patient profile: 19  yobm denies ever smoking but has lifelong asthma with severe chronic pattern and best FEV1 of around 2.2 liters documented 2002 and also documented non-adherence  followed in pulmonary for primary care also for hbp and hyperlipidemia.   History of Present Illness    07/30/13 Allergy eval rx completed x 4 years > d/c'd and f/u allergy prn    04/03/2015 Follow up : chronic asthma/ HTN/non adherence / relies on samples/ no formulary / has med calendar  Pt returns for 1 month follow up .  Doing well with no asthma flare , on Dulera . Marland Kitchen  Need to establish with dentist > multiple teeth extracted   05/06/2016 NP Follow up ; Severe chronic asthma  Patient presents for 1 month follow up and med review We reviewed all his medications organize them into a medication count with patient education Appears he is taking medications correctly. However, does depend on samples. He did not have his Pepcid or Protonix today but says he has them at home. Says overall his breathing is doing well without any flare cough or wheezing. Seen by allergy Dr. Annamaria Boots on 04/23/16   , IgE 203, elevated eos Looking into Nucala.  Prevnar and PVX utd.  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 Is able to sing without sob.  rec No change rx   06/04/16 Dr Annamaria Boots rec Nucala > could not afford deductible       03/26/2019  f/u ov/Takima Encina re:  Chronic asthma/ new BPV better with meclizine / maint on symbicort 160 2bid but sample dep/ Prednisone is plan D but not using  Chief Complaint  Patient presents with  . Hospitalization Follow-up    Dizziness. Patient stated he has not been having any issues with his breathing.  Dyspnea:  Not limited by breathing from desired activities  /very sedentary / nl pace flat surfaces/ shopping ok  Cough: none Sleeping: bed flat  1-2 pillows  SABA use: neb once daily due to allergies but has not  started prednisone yet  02: none  rec Plan A = Automatic = symbicort 160 Take 2 puffs first thing in am and then another 2 puffs about 12 hours later.  Work on Education officer, community B = Backup Plan C = Crisis - only use your albuterol nebulizer if you first try Plan B and it fails to help Plan D = Deltasone  If doing ABC and not improving then prednisone 10 mg x 2 until better then 1 daily x 3 days and stop Plan E = ER - go to ER or call 911 if all else fails     04/19/2019  f/u ov/Filbert Craze re: chronic asthma  Chief Complaint  Patient presents with  . Follow-up    Breathing is doing well today. He has not been using his albuterol inhaler or neb.  Dyspnea:  MMRC2 = can't walk a nl pace on a flat grade s sob but does fine slow and flat e Cough: clearing thorat a lot  Sleeping: ok flat/ one pillow SABA use: only has neb, hasn't used in a while  02: none Does not recall last time he needed pred as plan C rec Plan A = Automatic = symbicort 160 Take 2 puffs first thing in am and then another 2 puffs about 12 hours later.  Work on inhaler technique:  Plan B = albuterol inhaler but you don't have one so skip this step Plan C = Crisis - only use your albuterol nebulizer -ok to use the nebulizer up to every 4 hours but if start needing it regularly call for immediate appointment Plan D = Deltasone If need to use the nebulizer more than you do now then take prednsione 10 m x 2 each am until better then 1 daily  Plan E = ER - go to ER or call 911 if all else fails   Please schedule a follow up office visit in 4 weeks, sooner if needed  with all medications /inhalers/ solutions in hand so we can verify exactly what you are taking. This includes all medications from all doctors and over the counters - has 30 day samples of symbicort 160 with about 30 left on present rx     05/21/2019  f/u ov/Marjan Rosman re: confused again re action plan / the one med he doesn't have on him his his rescue saba  Chief  Complaint  Patient presents with  . Follow-up    Breathing is overall doinwell well. He is using his ventolin about 2 x per day and has not used his neb lately. Needs refills on pepcid and meclizine.   Dyspnea:  MMRC2 = can't walk a nl pace on a flat grade s sob but does fine slow and flat  Cough: none Sleeping: lie flat ok  SABA use: twice daily whether he needs it or not  02: none  rec Plan A = Automatic = symbicort 160 Take 2 puffs first thing in am and then another 2 puffs about 12 hours later.  Work on Orthoptist B = Backup Only use your albuterol inhaler as a rescue medication Plan C = Crisis - only use your albuterol nebulizer if you first try Plan B and it fails to help > ok to use the nebulizer up to every 4 hours but if start needing it regularly call for immediate appointment Plan D = Deltasone If need to use the nebulizer more than you do now then take prednsione 10 m x 2 each am until better then 1 daily  X 5 day and stop  Please schedule a follow up office visit in 4 weeks, sooner if needed  with all medications /inhalers/ solutions in hand so we can verify exactly what you are taking. This includes all medications from all doctors and over the counters      09/29/2019  f/u ov/Lourdes Manning re: chronic severe asthma/ counts off by 30 again Chief Complaint  Patient presents with  . Follow-up    Breathing is doing well and he denies any new co's. He has not had to use his pred or albuterol inhaler or neb.   Dyspnea:  MMRC2 = can't walk a nl pace on a flat grade s sob but does fine slow and flat  Cough: none  Sleeping: fine flat  SABA use: none / also no recent need for short term prednisone 02: no rec No change medications - ok to leave off pepcid  See calendar for specific medication instructions   01/25/2020  f/u ov/Ruble Buttler re: chronic severe asthma/ does not know drug formulary  Chief Complaint  Patient presents with  . Follow-up    Breathing has been doing  well. He has not had to use his albuterol inhaler or neb lately. Has occ left side pain.   Dyspnea:  No change = MMRC2 = can't walk a  nl pace on a flat grade s sob but does fine slow and flat  Cough: no  Sleeping: ok flat  SABA use: none  02: none  New problem is boil in L groin x sev weeks, already drained on it's own   No obvious day to day or daytime variability or assoc excess/ purulent sputum or mucus plugs or hemoptysis or cp or chest tightness, subjective wheeze or overt sinus or hb symptoms.   Sleeping as above  without nocturnal  or early am exacerbation  of respiratory  c/o's or need for noct saba. Also denies any obvious fluctuation of symptoms with weather or environmental changes or other aggravating or alleviating factors except as outlined above   No unusual exposure hx or h/o childhood pna/ asthma or knowledge of premature birth.  Current Allergies, Complete Past Medical History, Past Surgical History, Family History, and Social History were reviewed in [facility] record.  ROS  The following are not active complaints unless bolded Hoarseness, sore throat, dysphagia, dental problems, itching, sneezing,  nasal congestion or discharge of excess mucus or purulent secretions, ear ache,   fever, chills, sweats, unintended wt loss or wt gain, classically pleuritic or exertional cp,  orthopnea pnd or arm/hand swelling  or leg swelling, presyncope, palpitations, abdominal pain, anorexia, nausea, vomiting, diarrhea  or change in bowel habits or change in bladder habits, change in stools or change in urine, dysuria, hematuria,  rash, arthralgias, visual complaints, headache, numbness, weakness or ataxia or problems with walking or coordination,  change in mood or  memory.        Current Meds  Medication Sig  . albuterol (PROAIR HFA) 108 (90 Base) MCG/ACT inhaler 2 puffs every 4 hours as needed for wheezing ((PLANB))  . albuterol (PROVENTIL) (2.5 MG/3ML) 0.083%  nebulizer solution USE 1 VIAL IN NEBULIZER EVERY 4 HOURS AS NEEDED (Patient taking differently: Take 2.5 mg by nebulization every 4 (four) hours as needed for wheezing or shortness of breath. USE 1 VIAL IN NEBULIZER EVERY 4 HOURS AS NEEDED)  . Ascorbic Acid (VITAMIN C) 1000 MG tablet Take 1,000 mg by mouth daily.  Marland Kitchen aspirin 81 MG EC tablet Take 81 mg by mouth every morning.   . budesonide-formoterol (SYMBICORT) 160-4.5 MCG/ACT inhaler Inhale 2 puffs into the lungs 2 (two) times daily.  . budesonide-formoterol (SYMBICORT) 160-4.5 MCG/ACT inhaler Inhale 2 puffs into the lungs 2 (two) times daily.  . CHELATED MAGNESIUM PO Take 1 tablet by mouth daily.  . cholecalciferol (VITAMIN D3) 25 MCG (1000 UT) tablet Take 1,000 Units by mouth daily.  . cyanocobalamin 1000 MCG tablet Take 1,000 mcg by mouth daily.  Marland Kitchen guaifenesin (HUMIBID E) 400 MG TABS tablet Take 400 mg by mouth every 4 (four) hours as needed.  . Ibuprofen 200 MG CAPS Take 200 mg by mouth every 6 (six) hours as needed (headache/pain).   . meclizine (ANTIVERT) 12.5 MG tablet TAKE 1 TABLET BY MOUTH 4 TIMES A DAY AS NEEDED FOR DIZZINESS  . oxymetazoline (AFRIN) 0.05 % nasal spray Place 1 spray into both nostrils 2 (two) times daily as needed for congestion.  . Polyvinyl Alcohol-Povidone (REFRESH OP) Apply 1 drop to eye as needed (dry eyes).   . predniSONE (DELTASONE) 10 MG tablet 2 daily until better, then 1 x 3 days and stop  . Respiratory Therapy Supplies (FLUTTER) DEVI Use every 4 hours as needed for cough and congestion  Past Medical Hx ASTHMA (ICD-493.90)  --chronic severe asthma with best FEV1 2.2 L documented 01/2001 and  documented non-adherence Mainly lives off samples of inhalers and nose sprays  - HFA 75% June 15, 2009 > 90% August 02, 2009 > 100% December 27, 2009  - Off allergy vaccines x7/2010 > highly allergic skin tests per Dr Annamaria Boots > restarted vaccine November 06, 2009 > stopped  07/2013  SHOULDER PAIN, RIGHT  (ICD-719.41)   HYPERTENSION, BORDERLINE (ICD-401.9)  Shingles T 8 Left 08/2000 HEALTH MAINTENANCE...............................................................................................Marland KitchenWert  - Colonoscopy 12/1989,  2011  - dT 07/25/2014  - Pneumovax 02/2003 and 05/2009 - Prevnar 07/25/14 -Flu declined October 17, 2008 and September 21, 2009 , declined flu shot 2015, 2017  -CPX 07/21/2019            Objective:  Physical Exam        01/25/2020    193  09/29/2019  194  05/23/2014  201> 201 07/04/2014 > 07/25/14  196>198 08/22/2014 >194 09/19/2014 >198 10/11/2014 > 10/24/2014  196>   01/02/2015 193 > 03/06/2015  198 >198 04/03/2015 > 05/16/2015 196 >  06/02/2015 196 >197 06/16/2015 > 12/12/2015 199> 197 01/08/2016 >   02/16/2016     194 > 04/08/2016  193   Stoic bm nad  Vital signs reviewed  01/25/2020  - Note at rest 02 sats  95% on RA   HEENT : pt wearing mask not removed for exam due to covid -19 concerns.    NECK :  without JVD/Nodes/TM/ nl carotid upstrokes bilaterally   LUNGS: no acc muscle use,  Mod barrel  contour chest wall with bilateral  Distant bs s audible wheeze and  without cough on insp or exp maneuvers and mod  Hyperresonant  to  percussion bilaterally     CV:  RRR  no s3 or murmur or increase in P2, and no edema   ABD:  soft and nontender with pos mid insp Hoover's  in the supine position. No bruits or organomegaly appreciated, bowel sounds nl  MS:     ext warm without deformities, calf tenderness, cyanosis or clubbing No obvious joint restrictions   SKIN: warm and dry with classic carbuncle L inguinal fold  X quarter size, no assoc erythema or adenopathy.  NEURO:  alert, approp, nl sensorium with  no motor or cerebellar deficits apparent.

## 2020-01-26 ENCOUNTER — Encounter: Payer: Self-pay | Admitting: Internal Medicine

## 2020-01-26 DIAGNOSIS — L02234 Carbuncle of groin: Secondary | ICD-10-CM | POA: Insufficient documentation

## 2020-01-26 NOTE — Assessment & Plan Note (Addendum)
Onset in childhood  --chronic severe asthma with best FEV1 2.2 L documented 01/2001 and documented non-adherence Mainly lives off samples of inhalers and nose sprays  - Off allergy vaccines x7/2010 > highly allergic skin tests per Dr Annamaria Boots > restart vaccine November 06, 2009>stop 07/30/13 to watch - Spirometry on "good day" 01/14/12 =  FEV1 1.54  Ratio 0.58  - 02/16/2016 added prn prednisone when needing neb - FENO 04/23/16- 32  -CBC with differential 04/23/2016-eosinophils elevated 7%  - Spirometry 01/28/2017  FEV1 1.23 (49%)  Ratio 60    - Spirometry 08/11/2018  FEV1 1.43 (60%)  Ratio 60  - FENO 08/11/2018  =   30 on symb 160 2bid  - 09/29/2019  After extensive coaching inhaler device,  effectiveness =    90% from baseline 75% (short Ti)   - The proper method of use, as well as anticipated side effects, of a metered-dose inhaler are discussed and demonstrated to the patient. Improved effectiveness after extensive coaching during this visit to a level of approximately 75 % from a baseline of 50 % (still too short Ti)    Despite continued challenges with access to meds remains well controlled typical of ACOS with fixed airflow obst he's learned to live with.  No change in recs  Advised:  formulary restrictions will be an ongoing challenge for the forseable future and I would be happy to pick an alternative if the pt will first  provide me a list of them -  pt  will need to return here for training for any new device that is required eg dpi vs hfa vs respimat.    In the meantime we can always provide samples so that the patient never runs out of any needed respiratory medications.

## 2020-01-26 NOTE — Assessment & Plan Note (Signed)
Onset Jan 2021 > rx 01/25/2020 with omnicef/ f/u gen surgery prn   Also advised warm soaks, call for referral if not better p completes rx   Pt informed of the seriousness of COVID 19 infection as a direct risk to lung health  and safey and to close contacts and should continue to wear a facemask in public and minimize exposure to public locations but especially avoid any area or activity where non-close contacts are not observing distancing or wearing an appropriate face mask.  I strongly recommended vaccine when offered.            Each maintenance medication was reviewed in detail including emphasizing most importantly the difference between maintenance and prns and under what circumstances the prns are to be triggered using an action plan format where appropriate.  Total time for H and P, chart review, counseling, teaching device and generating customized AVS unique to this office visit / charting = 30 min

## 2020-01-27 DIAGNOSIS — M9903 Segmental and somatic dysfunction of lumbar region: Secondary | ICD-10-CM | POA: Diagnosis not present

## 2020-01-27 DIAGNOSIS — M9901 Segmental and somatic dysfunction of cervical region: Secondary | ICD-10-CM | POA: Diagnosis not present

## 2020-01-27 DIAGNOSIS — M9902 Segmental and somatic dysfunction of thoracic region: Secondary | ICD-10-CM | POA: Diagnosis not present

## 2020-01-27 DIAGNOSIS — M9904 Segmental and somatic dysfunction of sacral region: Secondary | ICD-10-CM | POA: Diagnosis not present

## 2020-02-01 ENCOUNTER — Ambulatory Visit: Payer: Medicare Other | Admitting: Internal Medicine

## 2020-02-02 DIAGNOSIS — M9901 Segmental and somatic dysfunction of cervical region: Secondary | ICD-10-CM | POA: Diagnosis not present

## 2020-02-02 DIAGNOSIS — M9902 Segmental and somatic dysfunction of thoracic region: Secondary | ICD-10-CM | POA: Diagnosis not present

## 2020-02-02 DIAGNOSIS — M9903 Segmental and somatic dysfunction of lumbar region: Secondary | ICD-10-CM | POA: Diagnosis not present

## 2020-02-02 DIAGNOSIS — M545 Low back pain: Secondary | ICD-10-CM | POA: Diagnosis not present

## 2020-02-08 ENCOUNTER — Encounter: Payer: Self-pay | Admitting: Adult Health

## 2020-02-08 ENCOUNTER — Other Ambulatory Visit: Payer: Self-pay

## 2020-02-08 ENCOUNTER — Ambulatory Visit: Payer: Medicare Other | Admitting: Internal Medicine

## 2020-02-08 ENCOUNTER — Ambulatory Visit (INDEPENDENT_AMBULATORY_CARE_PROVIDER_SITE_OTHER): Payer: Medicare Other | Admitting: Adult Health

## 2020-02-08 DIAGNOSIS — I10 Essential (primary) hypertension: Secondary | ICD-10-CM

## 2020-02-08 DIAGNOSIS — J3089 Other allergic rhinitis: Secondary | ICD-10-CM | POA: Diagnosis not present

## 2020-02-08 DIAGNOSIS — E7800 Pure hypercholesterolemia, unspecified: Secondary | ICD-10-CM

## 2020-02-08 DIAGNOSIS — L02234 Carbuncle of groin: Secondary | ICD-10-CM

## 2020-02-08 DIAGNOSIS — J302 Other seasonal allergic rhinitis: Secondary | ICD-10-CM | POA: Diagnosis not present

## 2020-02-08 DIAGNOSIS — J4550 Severe persistent asthma, uncomplicated: Secondary | ICD-10-CM | POA: Diagnosis not present

## 2020-02-08 MED ORDER — ALBUTEROL SULFATE HFA 108 (90 BASE) MCG/ACT IN AERS: INHALATION_SPRAY | RESPIRATORY_TRACT | 5 refills | Status: DC

## 2020-02-08 MED ORDER — BUDESONIDE-FORMOTEROL FUMARATE 160-4.5 MCG/ACT IN AERO: INHALATION_SPRAY | RESPIRATORY_TRACT | 5 refills | Status: DC

## 2020-02-08 NOTE — Patient Instructions (Signed)
Continue on Symbicort 2 puffs Twice daily  , rinse after use.  Follow medication calendar closely and bring to each visit.  Continue on current regimen .  Low fat cholesterol diet.  Low salt diet.  Follow up Dr. Melvyn Novas in 3 months and As needed

## 2020-02-08 NOTE — Assessment & Plan Note (Signed)
Resolved with antibiotics ,

## 2020-02-08 NOTE — Assessment & Plan Note (Signed)
Currently under control.  Patient advised to use Flonase and Zyrtec as needed

## 2020-02-08 NOTE — Progress Notes (Signed)
@Patient  ID: [name], male    DOB: [birth year], 79 y.o.   MRN: [id number]  Chief Complaint  Patient presents with  . Follow-up    Asthma     Referring provider: Tanda Rockers, MD  HPI: 79 year old male never smoker followed for severe chronic asthma.  Patient is a primary care patient of Dr. Melvyn Novas followed for hypertension and hyperlipidemia.  SH-never smoker, had a twin sister that died from a brain tumor, divorced with kids in [place] state, sings gospel music  TEST/EVENTS :  chronic severe asthma with best FEV1 2.2 L documented 01/2001 and documented non-adherence Mainly lives off samples of inhalers and nose sprays - Off allergy vaccines x7/2010 >highly allergic skin tests per Dr Annamaria Boots >restart vaccine November 06, 2009>stop 07/30/13 to watch - Spirometry on "good day" 01/14/12 = FEV1 1.54 Ratio 0.58  - 2/24/2017added prn prednisone when needing neb - FENO 04/23/16- 32  -CBC with differential 04/23/2016-eosinophils elevated 7.090%  - Spirometry 2/6/2018FEV1 1.23 (49%) Ratio 60  -Colon 2016 - no further colons needed.  -PVX (2010) , Prevnar (2015) , TDAP 2015) -declines flu shot , covid vaccine   02/08/2020 Follow up : Chronic asthma, hypertension, hyperlipidemia Patient presents for a 2-week follow-up.  Patient says overall his asthma has been doing well.  He has had no flare of his cough or wheezing.  He has brought his medicines in today for review.  We reviewed all his medications.  Patient has limited income and has some trouble at times affording his copayments.  He needs a refill of his Symbicort.  We did send in paperwork for patient assistance for Symbicort however have not received any information back yet.  Patient says he tries to stay active.  Gets short of breath with heavy activity but is able to do his normal routine without much difficulty.  Patient did have a boil in his left groin area last visit.  He was given antibiotics patient says this is  completely cleared up.  Patient has some mild hypertension that is maintained on diet alone.  Today blood pressure is doing well.  He denies any headache or chest pain.  Patient has of hyperlipidemia we discussed a healthy diet.   No Known Allergies  Immunization History  Administered Date(s) Administered  . Pneumococcal Conjugate-13 07/25/2014  . Pneumococcal Polysaccharide-23 12/23/2008  . Tdap 07/25/2014    Past Medical History:  Diagnosis Date  . Acid reflux   . Asthma 06/18/2011  . Borderline hypertension   . Cataract   . Colon polyps 2011   1 Hyperplastic and 1 Tubular Adenoma   . Dysphagia   . Esophageal stricture   . GERD (gastroesophageal reflux disease)   . Hx of adenomatous colonic polyps 06/25/2015  . Personal history of colonic polyps 12/1989  . Shingles   . Shoulder pain, right   . Unspecified asthma(493.90)   . Urinary frequency     Tobacco History: Social History   Tobacco Use  Smoking Status Never Smoker  Smokeless Tobacco Never Used   Counseling given: Not Answered   Outpatient Medications Prior to Visit  Medication Sig Dispense Refill  . albuterol (PROAIR HFA) 108 (90 Base) MCG/ACT inhaler 2 puffs every 4 hours as needed for wheezing ((PLANB)) 1 Inhaler 5  . albuterol (PROVENTIL) (2.5 MG/3ML) 0.083% nebulizer solution USE 1 VIAL IN NEBULIZER EVERY 4 HOURS AS NEEDED (Patient taking differently: Take 2.5 mg by nebulization every 4 (four) hours as needed for wheezing or shortness of  breath. USE 1 VIAL IN NEBULIZER EVERY 4 HOURS AS NEEDED) 75 mL 3  . Ascorbic Acid (VITAMIN C) 1000 MG tablet Take 1,000 mg by mouth daily.    Marland Kitchen aspirin 81 MG EC tablet Take 81 mg by mouth every morning.     . budesonide-formoterol (SYMBICORT) 160-4.5 MCG/ACT inhaler Take 2 puffs first thing in am and then another 2 puffs about 12 hours later. 1 Inhaler 12  . cefdinir (OMNICEF) 300 MG capsule Take 1 capsule (300 mg total) by mouth 2 (two) times daily. 20 capsule 0  .  CHELATED MAGNESIUM PO Take 1 tablet by mouth daily.    . cholecalciferol (VITAMIN D3) 25 MCG (1000 UT) tablet Take 1,000 Units by mouth daily.    . cyanocobalamin 1000 MCG tablet Take 1,000 mcg by mouth daily.    . famotidine (PEPCID) 20 MG tablet Take 20 mg by mouth daily.     Marland Kitchen guaifenesin (HUMIBID E) 400 MG TABS tablet Take 400 mg by mouth every 4 (four) hours as needed.    . Ibuprofen 200 MG CAPS Take 200 mg by mouth every 6 (six) hours as needed (headache/pain).     . meclizine (ANTIVERT) 12.5 MG tablet TAKE 1 TABLET BY MOUTH 4 TIMES A DAY AS NEEDED FOR DIZZINESS 40 tablet 5  . oxymetazoline (AFRIN) 0.05 % nasal spray Place 1 spray into both nostrils 2 (two) times daily as needed for congestion.    . Polyvinyl Alcohol-Povidone (REFRESH OP) Apply 1 drop to eye as needed (dry eyes).     . predniSONE (DELTASONE) 10 MG tablet 2 daily until better, then 1 x 3 days and stop 100 tablet 5  . Respiratory Therapy Supplies (FLUTTER) DEVI Use every 4 hours as needed for cough and congestion     No facility-administered medications prior to visit.     Review of Systems:   Constitutional:   No  weight loss, night sweats,  Fevers, chills, fatigue, or  lassitude.  HEENT:   No headaches,  Difficulty swallowing,  Tooth/dental problems, or  Sore throat,                No sneezing, itching, ear ache, nasal congestion, post nasal drip,   CV:  No chest pain,  Orthopnea, PND, swelling in lower extremities, anasarca, dizziness, palpitations, syncope.   GI  No heartburn, indigestion, abdominal pain, nausea, vomiting, diarrhea, change in bowel habits, loss of appetite, bloody stools.   Resp: No shortness of breath with exertion or at rest.  No excess mucus, no productive cough,  No non-productive cough,  No coughing up of blood.  No change in color of mucus.  No wheezing.  No chest wall deformity  Skin: no rash or lesions.  GU: no dysuria, change in color of urine, no urgency or frequency.  No flank pain,  no hematuria   MS:  No joint pain or swelling.  No decreased range of motion.  No back pain.    Physical Exam  BP 124/66 (BP Location: Left Arm, Cuff Size: Normal)   Pulse 82   Temp (!) 97.3 F (36.3 C) (Temporal)   Ht 5\' 6"  (1.676 m)   Wt 190 lb 9.6 oz (86.5 kg)   SpO2 96% Comment: RA  BMI 30.76 kg/m   GEN: A/Ox3; pleasant , NAD, well nourished    HEENT:  [place]/AT,   , NOSE-clear, THROAT-clear, no lesions, no postnasal drip or exudate noted.   NECK:  Supple w/ fair ROM; no JVD;  normal carotid impulses w/o bruits; no thyromegaly or nodules palpated; no lymphadenopathy.    RESP  Clear  P & A; w/o, wheezes/ rales/ or rhonchi. no accessory muscle use, no dullness to percussion  CARD:  RRR, no m/r/g, no peripheral edema, pulses intact, no cyanosis or clubbing.  GI:   Soft & nt; nml bowel sounds; no organomegaly or masses detected.   Musco: Warm bil, no deformities or joint swelling noted.   Neuro: alert, no focal deficits noted.    Skin: Warm, no lesions or rashes    Lab Results:  CBC    Component Value Date/Time   WBC 7.9 07/21/2019 1132   RBC 5.00 07/21/2019 1132   HGB 14.8 07/21/2019 1132   HCT 45.3 07/21/2019 1132   PLT 327.0 07/21/2019 1132   MCV 90.7 07/21/2019 1132   MCH 29.7 03/23/2019 0621   MCHC 32.6 07/21/2019 1132   RDW 13.8 07/21/2019 1132   LYMPHSABS 2.8 07/21/2019 1132   MONOABS 0.6 07/21/2019 1132   EOSABS 0.5 07/21/2019 1132   BASOSABS 0.1 07/21/2019 1132    BMET    Component Value Date/Time   NA 143 07/21/2019 1132   K 4.0 07/21/2019 1132   CL 108 07/21/2019 1132   CO2 27 07/21/2019 1132   GLUCOSE 95 07/21/2019 1132   BUN 11 07/21/2019 1132   CREATININE 1.36 07/21/2019 1132   CALCIUM 9.6 07/21/2019 1132   GFRNONAA 51 (L) 03/23/2019 0621   GFRAA 59 (L) 03/23/2019 0621    BNP No results found for: BNP  ProBNP No results found for: PROBNP  Imaging: No results found.    No flowsheet data found.  Lab Results  Component Value  Date   NITRICOXIDE 32 04/23/2016        Assessment & Plan:   Essential hypertension, benign Controlled on diet alone.  Encouraged on low-salt diet activity as tolerated  Seasonal and perennial allergic rhinitis Currently under control.  Patient advised to use Flonase and Zyrtec as needed  Severe persistent asthma Well controlled on present regimen  Med affordability is main issue  Patient's medications were reviewed today and patient education was given. Computerized medication calendar was adjusted/completed  Plan  Patient Instructions  Continue on Symbicort 2 puffs Twice daily  , rinse after use.  Follow medication calendar closely and bring to each visit.  Continue on current regimen .  Low fat cholesterol diet.  Low salt diet.  Follow up Dr. Melvyn Novas in 3 months and As needed        Hyperlipidemia Diet discussed in detail activity as tolerated  Carbuncle of inguinal region Resolved with antibiotics ,      Rexene Edison, NP 02/08/2020

## 2020-02-08 NOTE — Assessment & Plan Note (Signed)
Controlled on diet alone.  Encouraged on low-salt diet activity as tolerated

## 2020-02-08 NOTE — Addendum Note (Signed)
Addended by: Parke Poisson E on: 02/08/2020 11:01 AM   Modules accepted: Orders

## 2020-02-08 NOTE — Assessment & Plan Note (Signed)
Diet discussed in detail activity as tolerated

## 2020-02-08 NOTE — Assessment & Plan Note (Signed)
Well controlled on present regimen  Med affordability is main issue  Patient's medications were reviewed today and patient education was given. Computerized medication calendar was adjusted/completed  Plan  Patient Instructions  Continue on Symbicort 2 puffs Twice daily  , rinse after use.  Follow medication calendar closely and bring to each visit.  Continue on current regimen .  Low fat cholesterol diet.  Low salt diet.  Follow up Dr. Melvyn Novas in 3 months and As needed

## 2020-02-17 DIAGNOSIS — M9902 Segmental and somatic dysfunction of thoracic region: Secondary | ICD-10-CM | POA: Diagnosis not present

## 2020-02-17 DIAGNOSIS — M9903 Segmental and somatic dysfunction of lumbar region: Secondary | ICD-10-CM | POA: Diagnosis not present

## 2020-02-17 DIAGNOSIS — M9904 Segmental and somatic dysfunction of sacral region: Secondary | ICD-10-CM | POA: Diagnosis not present

## 2020-02-17 DIAGNOSIS — M9901 Segmental and somatic dysfunction of cervical region: Secondary | ICD-10-CM | POA: Diagnosis not present

## 2020-03-15 DIAGNOSIS — M9902 Segmental and somatic dysfunction of thoracic region: Secondary | ICD-10-CM | POA: Diagnosis not present

## 2020-03-15 DIAGNOSIS — M9903 Segmental and somatic dysfunction of lumbar region: Secondary | ICD-10-CM | POA: Diagnosis not present

## 2020-03-15 DIAGNOSIS — M9901 Segmental and somatic dysfunction of cervical region: Secondary | ICD-10-CM | POA: Diagnosis not present

## 2020-03-15 DIAGNOSIS — M545 Low back pain: Secondary | ICD-10-CM | POA: Diagnosis not present

## 2020-03-30 DIAGNOSIS — M9901 Segmental and somatic dysfunction of cervical region: Secondary | ICD-10-CM | POA: Diagnosis not present

## 2020-03-30 DIAGNOSIS — M9902 Segmental and somatic dysfunction of thoracic region: Secondary | ICD-10-CM | POA: Diagnosis not present

## 2020-03-30 DIAGNOSIS — M9903 Segmental and somatic dysfunction of lumbar region: Secondary | ICD-10-CM | POA: Diagnosis not present

## 2020-03-30 DIAGNOSIS — M545 Low back pain: Secondary | ICD-10-CM | POA: Diagnosis not present

## 2020-04-28 DIAGNOSIS — M9904 Segmental and somatic dysfunction of sacral region: Secondary | ICD-10-CM | POA: Diagnosis not present

## 2020-04-28 DIAGNOSIS — M9901 Segmental and somatic dysfunction of cervical region: Secondary | ICD-10-CM | POA: Diagnosis not present

## 2020-04-28 DIAGNOSIS — M9902 Segmental and somatic dysfunction of thoracic region: Secondary | ICD-10-CM | POA: Diagnosis not present

## 2020-04-28 DIAGNOSIS — M9903 Segmental and somatic dysfunction of lumbar region: Secondary | ICD-10-CM | POA: Diagnosis not present

## 2020-05-03 DIAGNOSIS — M9901 Segmental and somatic dysfunction of cervical region: Secondary | ICD-10-CM | POA: Diagnosis not present

## 2020-05-03 DIAGNOSIS — M9903 Segmental and somatic dysfunction of lumbar region: Secondary | ICD-10-CM | POA: Diagnosis not present

## 2020-05-03 DIAGNOSIS — M9902 Segmental and somatic dysfunction of thoracic region: Secondary | ICD-10-CM | POA: Diagnosis not present

## 2020-05-03 DIAGNOSIS — M9904 Segmental and somatic dysfunction of sacral region: Secondary | ICD-10-CM | POA: Diagnosis not present

## 2020-05-05 DIAGNOSIS — M9903 Segmental and somatic dysfunction of lumbar region: Secondary | ICD-10-CM | POA: Diagnosis not present

## 2020-05-05 DIAGNOSIS — M9901 Segmental and somatic dysfunction of cervical region: Secondary | ICD-10-CM | POA: Diagnosis not present

## 2020-05-05 DIAGNOSIS — M9904 Segmental and somatic dysfunction of sacral region: Secondary | ICD-10-CM | POA: Diagnosis not present

## 2020-05-05 DIAGNOSIS — M9902 Segmental and somatic dysfunction of thoracic region: Secondary | ICD-10-CM | POA: Diagnosis not present

## 2020-05-08 ENCOUNTER — Other Ambulatory Visit: Payer: Self-pay

## 2020-05-08 ENCOUNTER — Ambulatory Visit: Payer: Medicare Other | Admitting: Pulmonary Disease

## 2020-05-08 ENCOUNTER — Ambulatory Visit (INDEPENDENT_AMBULATORY_CARE_PROVIDER_SITE_OTHER): Payer: Medicare Other | Admitting: Primary Care

## 2020-05-08 ENCOUNTER — Ambulatory Visit: Payer: Medicare Other | Admitting: Internal Medicine

## 2020-05-08 ENCOUNTER — Encounter: Payer: Self-pay | Admitting: Primary Care

## 2020-05-08 DIAGNOSIS — M9904 Segmental and somatic dysfunction of sacral region: Secondary | ICD-10-CM | POA: Diagnosis not present

## 2020-05-08 DIAGNOSIS — J4550 Severe persistent asthma, uncomplicated: Secondary | ICD-10-CM | POA: Diagnosis not present

## 2020-05-08 DIAGNOSIS — M9902 Segmental and somatic dysfunction of thoracic region: Secondary | ICD-10-CM | POA: Diagnosis not present

## 2020-05-08 DIAGNOSIS — M9901 Segmental and somatic dysfunction of cervical region: Secondary | ICD-10-CM | POA: Diagnosis not present

## 2020-05-08 DIAGNOSIS — M9903 Segmental and somatic dysfunction of lumbar region: Secondary | ICD-10-CM | POA: Diagnosis not present

## 2020-05-08 MED ORDER — MONTELUKAST SODIUM 10 MG PO TABS: 10.0000 mg | ORAL_TABLET | Freq: Every day | ORAL | 6 refills | Status: DC

## 2020-05-08 MED ORDER — ALBUTEROL SULFATE HFA 108 (90 BASE) MCG/ACT IN AERS: INHALATION_SPRAY | RESPIRATORY_TRACT | 5 refills | Status: DC

## 2020-05-08 MED ORDER — BUDESONIDE-FORMOTEROL FUMARATE 160-4.5 MCG/ACT IN AERO: INHALATION_SPRAY | RESPIRATORY_TRACT | 5 refills | Status: DC

## 2020-05-08 NOTE — Assessment & Plan Note (Addendum)
-   Stable interval; seasonal allergies worsen his asthma symptoms. Rare SABA, last took prednisone 1 week ago.   - Plan A= Automatic - Continue Symbicort 160  2 puffs twice daily - Plan B = Back up- Use Ventolin two puffs every 4-6 hours as needed for breakthrough sob/wheezing - Plan C = Crisis- Albuterol nebulizer every 6 hours as needed for shortness of breath or wheezing if rescue inhaler is not effective - Plan D- Prednisone   Plan: - Adding Singulair 10 mg in the evening - Continue over-the-counter nasal spray once daily - Sending refills of Symbicort and Ventolin to your pharmacy - Follow-up with Dr. Melvyn Novas in 3 months

## 2020-05-08 NOTE — Progress Notes (Signed)
@Patient  ID: [name], male    DOB: 06/19/1941, 79 y.o.   MRN: [id number]  Chief Complaint  Patient presents with  . Follow-up    sob-same,occass. wheezing at night    Referring provider: Tanda Rockers, MD  Brief patient profile: 28 yobm denies ever smoking but has lifelong asthma with severe chronic pattern and best FEV1 of around 2.2 liters documented 2002 and also documented non-adherence  followed in pulmonary for primary care also for hbp and hyperlipidemia.   HPI: 79 year old male, never smoked. PMH significant for severe persistent asthma. Patient of Dr. Melvyn Novas, last seen on 01/25/20.  Hx IgE 203, eosinophils 500 in 2020. He could not afford Nucala. Maintained on Symbicort 160 2 bid and prn Albuterol. Prednisone is plan D.   05/08/2020 Patient presents today for regular follow-up. Breathing is worse at night and clears during the day. He notices that seasonal weather changes affect this breathing. He will develop more cough and congestion during these times. Using Symbicort two puffs in the morning and two puffs at night. He is seldomly needing to use his albuterol inhaler. He needs refill of both medications. He is using an OTC nasal spray. Does not appear he is taking anything for allergies. He takes prednisone when needed, last needed to take last week. He brought his medication in with him today. Taking medication as prescribed.   No Known Allergies  Immunization History  Administered Date(s) Administered  . Pneumococcal Conjugate-13 07/25/2014  . Pneumococcal Polysaccharide-23 12/23/2008  . Tdap 07/25/2014    Past Medical History:  Diagnosis Date  . Acid reflux   . Asthma 06/18/2011  . Borderline hypertension   . Cataract   . Colon polyps 2011   1 Hyperplastic and 1 Tubular Adenoma   . Dysphagia   . Esophageal stricture   . GERD (gastroesophageal reflux disease)   . Hx of adenomatous colonic polyps 06/25/2015  . Personal history of colonic polyps 12/1989  .  Shingles   . Shoulder pain, right   . Unspecified asthma(493.90)   . Urinary frequency     Tobacco History: Social History   Tobacco Use  Smoking Status Never Smoker  Smokeless Tobacco Never Used   Counseling given: Not Answered   Outpatient Medications Prior to Visit  Medication Sig Dispense Refill  . albuterol (PROVENTIL) (2.5 MG/3ML) 0.083% nebulizer solution USE 1 VIAL IN NEBULIZER EVERY 4 HOURS AS NEEDED (Patient taking differently: Take 2.5 mg by nebulization every 4 (four) hours as needed for wheezing or shortness of breath. USE 1 VIAL IN NEBULIZER EVERY 4 HOURS AS NEEDED) 75 mL 3  . Ascorbic Acid (VITAMIN C) 1000 MG tablet Take 1,000 mg by mouth daily.    Marland Kitchen aspirin 81 MG EC tablet Take 81 mg by mouth every morning.     . CHELATED MAGNESIUM PO Take 1 tablet by mouth daily.    . cholecalciferol (VITAMIN D3) 25 MCG (1000 UT) tablet Take 1,000 Units by mouth daily.    . cyanocobalamin 1000 MCG tablet Take 1,000 mcg by mouth daily.    . famotidine (PEPCID) 20 MG tablet Take 20 mg by mouth daily.     Marland Kitchen guaifenesin (HUMIBID E) 400 MG TABS tablet Take 400 mg by mouth every 4 (four) hours as needed.    . Ibuprofen 200 MG CAPS Take 200 mg by mouth every 6 (six) hours as needed (headache/pain).     . meclizine (ANTIVERT) 12.5 MG tablet TAKE 1 TABLET BY MOUTH 4  TIMES A DAY AS NEEDED FOR DIZZINESS 40 tablet 5  . oxymetazoline (AFRIN) 0.05 % nasal spray Place 1 spray into both nostrils 2 (two) times daily as needed for congestion.    . Polyvinyl Alcohol-Povidone (REFRESH OP) Apply 1 drop to eye as needed (dry eyes).     Marland Kitchen Respiratory Therapy Supplies (FLUTTER) DEVI Use every 4 hours as needed for cough and congestion    . albuterol (PROAIR HFA) 108 (90 Base) MCG/ACT inhaler 2 puffs every 4 hours as needed for wheezing ((PLANB)) 8 g 5  . budesonide-formoterol (SYMBICORT) 160-4.5 MCG/ACT inhaler Take 2 puffs first thing in am and then another 2 puffs about 12 hours later. 1 Inhaler 5  .  predniSONE (DELTASONE) 10 MG tablet 2 daily until better, then 1 x 3 days and stop (Patient not taking: Reported on 05/08/2020) 100 tablet 5  . cefdinir (OMNICEF) 300 MG capsule Take 1 capsule (300 mg total) by mouth 2 (two) times daily. (Patient not taking: Reported on 05/08/2020) 20 capsule 0   No facility-administered medications prior to visit.    Review of Systems  Review of Systems  Constitutional: Negative.   Respiratory: Positive for shortness of breath and wheezing.     Physical Exam  BP 138/68 (BP Location: Left Arm, Cuff Size: Normal)   Pulse 87   Temp 97.6 F (36.4 C) (Temporal)   Ht 5\' 6"  (1.676 m)   Wt 189 lb 6.4 oz (85.9 kg)   SpO2 100%   BMI 30.57 kg/m  Physical Exam Constitutional:      Appearance: Normal appearance.  HENT:     Head: Normocephalic and atraumatic.  Cardiovascular:     Rate and Rhythm: Normal rate and regular rhythm.  Pulmonary:     Effort: Pulmonary effort is normal.     Breath sounds: Wheezing present.  Neurological:     Mental Status: He is alert.  Psychiatric:        Mood and Affect: Mood normal.        Behavior: Behavior normal.        Thought Content: Thought content normal.        Judgment: Judgment normal.      Lab Results:  CBC    Component Value Date/Time   WBC 7.9 07/21/2019 1132   RBC 5.00 07/21/2019 1132   HGB 14.8 07/21/2019 1132   HCT 45.3 07/21/2019 1132   PLT 327.0 07/21/2019 1132   MCV 90.7 07/21/2019 1132   MCH 29.7 03/23/2019 0621   MCHC 32.6 07/21/2019 1132   RDW 13.8 07/21/2019 1132   LYMPHSABS 2.8 07/21/2019 1132   MONOABS 0.6 07/21/2019 1132   EOSABS 0.5 07/21/2019 1132   BASOSABS 0.1 07/21/2019 1132    BMET    Component Value Date/Time   NA 143 07/21/2019 1132   K 4.0 07/21/2019 1132   CL 108 07/21/2019 1132   CO2 27 07/21/2019 1132   GLUCOSE 95 07/21/2019 1132   BUN 11 07/21/2019 1132   CREATININE 1.36 07/21/2019 1132   CALCIUM 9.6 07/21/2019 1132   GFRNONAA 51 (L) 03/23/2019 0621    GFRAA 59 (L) 03/23/2019 0621    BNP No results found for: BNP  ProBNP No results found for: PROBNP  Imaging: No results found.   Assessment & Plan:   Severe persistent asthma - Stable interval; seasonal allergies worsen his asthma symptoms. Rare SABA, last took prednisone 1 week ago.   - Plan A= Automatic - Continue Symbicort 160  2 puffs twice  daily - Plan B = Back up- Use Ventolin two puffs every 4-6 hours as needed for breakthrough sob/wheezing - Plan C = Crisis- Albuterol nebulizer every 6 hours as needed for shortness of breath or wheezing if rescue inhaler is not effective - Plan D- Prednisone   Plan: - Adding Singulair 10 mg in the evening - Continue over-the-counter nasal spray once daily - Sending refills of Symbicort and Ventolin to your pharmacy - Follow-up with Dr. Melvyn Novas in 3 months    Martyn Ehrich, NP 05/08/2020

## 2020-05-08 NOTE — Patient Instructions (Addendum)
Pleasure meeting you [name]  Continue Symbicort 2 puffs in the morning and 2 puffs in the evening (rinse mouth after use)  Adding Singulair 10 mg in the evening  Continue over-the-counter nasal spray  Sending refills of Symbicort and Ventolin to your pharmacy  Follow-up with Dr. Melvyn Novas in 3 months

## 2020-05-08 NOTE — Progress Notes (Deleted)
@Patient  ID: [name], male    DOB: 1941-10-13, 79 y.o.   MRN: [id number]  Chief Complaint  Patient presents with  . Follow-up    sob-same,occass. wheezing at night    Referring provider: Tanda Rockers, MD  HPI: Eosinophils 600/   Breathing is worse at night and clears during the day. He notices that seasonal weather changes affect this breathing. He will develop more cough and congestion during these times. Using Symbicort two puffs in the morning and two puffs at night. He is seldomly needing to use his albuterol inhaler. He needs refill of both medications. He is using an OTC nasal spray. Does not appear he is taking anything for allergies. He takes prednisone when needed 2 days until better then 1 tab x 3 days, not currently. He last took prednisone last week.   No Known Allergies  Immunization History  Administered Date(s) Administered  . Pneumococcal Conjugate-13 07/25/2014  . Pneumococcal Polysaccharide-23 12/23/2008  . Tdap 07/25/2014    Past Medical History:  Diagnosis Date  . Acid reflux   . Asthma 06/18/2011  . Borderline hypertension   . Cataract   . Colon polyps 2011   1 Hyperplastic and 1 Tubular Adenoma   . Dysphagia   . Esophageal stricture   . GERD (gastroesophageal reflux disease)   . Hx of adenomatous colonic polyps 06/25/2015  . Personal history of colonic polyps 12/1989  . Shingles   . Shoulder pain, right   . Unspecified asthma(493.90)   . Urinary frequency     Tobacco History: Social History   Tobacco Use  Smoking Status Never Smoker  Smokeless Tobacco Never Used   Counseling given: Not Answered   Outpatient Medications Prior to Visit  Medication Sig Dispense Refill  . albuterol (PROAIR HFA) 108 (90 Base) MCG/ACT inhaler 2 puffs every 4 hours as needed for wheezing ((PLANB)) 8 g 5  . albuterol (PROVENTIL) (2.5 MG/3ML) 0.083% nebulizer solution USE 1 VIAL IN NEBULIZER EVERY 4 HOURS AS NEEDED (Patient taking differently: Take 2.5 mg  by nebulization every 4 (four) hours as needed for wheezing or shortness of breath. USE 1 VIAL IN NEBULIZER EVERY 4 HOURS AS NEEDED) 75 mL 3  . Ascorbic Acid (VITAMIN C) 1000 MG tablet Take 1,000 mg by mouth daily.    Marland Kitchen aspirin 81 MG EC tablet Take 81 mg by mouth every morning.     . budesonide-formoterol (SYMBICORT) 160-4.5 MCG/ACT inhaler Take 2 puffs first thing in am and then another 2 puffs about 12 hours later. 1 Inhaler 5  . CHELATED MAGNESIUM PO Take 1 tablet by mouth daily.    . cholecalciferol (VITAMIN D3) 25 MCG (1000 UT) tablet Take 1,000 Units by mouth daily.    . cyanocobalamin 1000 MCG tablet Take 1,000 mcg by mouth daily.    . famotidine (PEPCID) 20 MG tablet Take 20 mg by mouth daily.     Marland Kitchen guaifenesin (HUMIBID E) 400 MG TABS tablet Take 400 mg by mouth every 4 (four) hours as needed.    . Ibuprofen 200 MG CAPS Take 200 mg by mouth every 6 (six) hours as needed (headache/pain).     . meclizine (ANTIVERT) 12.5 MG tablet TAKE 1 TABLET BY MOUTH 4 TIMES A DAY AS NEEDED FOR DIZZINESS 40 tablet 5  . oxymetazoline (AFRIN) 0.05 % nasal spray Place 1 spray into both nostrils 2 (two) times daily as needed for congestion.    . Polyvinyl Alcohol-Povidone (REFRESH OP) Apply 1 drop to eye  as needed (dry eyes).     Marland Kitchen Respiratory Therapy Supplies (FLUTTER) DEVI Use every 4 hours as needed for cough and congestion    . cefdinir (OMNICEF) 300 MG capsule Take 1 capsule (300 mg total) by mouth 2 (two) times daily. (Patient not taking: Reported on 05/08/2020) 20 capsule 0  . predniSONE (DELTASONE) 10 MG tablet 2 daily until better, then 1 x 3 days and stop (Patient not taking: Reported on 05/08/2020) 100 tablet 5   No facility-administered medications prior to visit.      Review of Systems  Review of Systems   Physical Exam  BP 138/68 (BP Location: Left Arm, Cuff Size: Normal)   Pulse 87   Temp 97.6 F (36.4 C) (Temporal)   Ht 5\' 6"  (1.676 m)   Wt 189 lb 6.4 oz (85.9 kg)   SpO2 100%    BMI 30.57 kg/m  Physical Exam   Lab Results:  CBC    Component Value Date/Time   WBC 7.9 07/21/2019 1132   RBC 5.00 07/21/2019 1132   HGB 14.8 07/21/2019 1132   HCT 45.3 07/21/2019 1132   PLT 327.0 07/21/2019 1132   MCV 90.7 07/21/2019 1132   MCH 29.7 03/23/2019 0621   MCHC 32.6 07/21/2019 1132   RDW 13.8 07/21/2019 1132   LYMPHSABS 2.8 07/21/2019 1132   MONOABS 0.6 07/21/2019 1132   EOSABS 0.5 07/21/2019 1132   BASOSABS 0.1 07/21/2019 1132    BMET    Component Value Date/Time   NA 143 07/21/2019 1132   K 4.0 07/21/2019 1132   CL 108 07/21/2019 1132   CO2 27 07/21/2019 1132   GLUCOSE 95 07/21/2019 1132   BUN 11 07/21/2019 1132   CREATININE 1.36 07/21/2019 1132   CALCIUM 9.6 07/21/2019 1132   GFRNONAA 51 (L) 03/23/2019 0621   GFRAA 59 (L) 03/23/2019 0621    BNP No results found for: BNP  ProBNP No results found for: PROBNP  Imaging: No results found.   Assessment & Plan:   No problem-specific Assessment & Plan notes found for this encounter.     Martyn Ehrich, NP 05/08/2020

## 2020-06-05 DIAGNOSIS — M9901 Segmental and somatic dysfunction of cervical region: Secondary | ICD-10-CM | POA: Diagnosis not present

## 2020-06-05 DIAGNOSIS — M545 Low back pain: Secondary | ICD-10-CM | POA: Diagnosis not present

## 2020-06-08 DIAGNOSIS — M9901 Segmental and somatic dysfunction of cervical region: Secondary | ICD-10-CM | POA: Diagnosis not present

## 2020-06-08 DIAGNOSIS — M545 Low back pain: Secondary | ICD-10-CM | POA: Diagnosis not present

## 2020-06-13 ENCOUNTER — Telehealth: Payer: Self-pay | Admitting: Internal Medicine

## 2020-06-13 MED ORDER — ALBUTEROL SULFATE (2.5 MG/3ML) 0.083% IN NEBU: INHALATION_SOLUTION | RESPIRATORY_TRACT | 5 refills | Status: DC

## 2020-06-13 MED ORDER — BUDESONIDE-FORMOTEROL FUMARATE 160-4.5 MCG/ACT IN AERO: INHALATION_SPRAY | RESPIRATORY_TRACT | 5 refills | Status: DC

## 2020-06-13 NOTE — Telephone Encounter (Signed)
Patient called, refills for Symbicort and Albuterol solution set to preferred pharmacy.

## 2020-07-04 DIAGNOSIS — M9901 Segmental and somatic dysfunction of cervical region: Secondary | ICD-10-CM | POA: Diagnosis not present

## 2020-07-04 DIAGNOSIS — M545 Low back pain: Secondary | ICD-10-CM | POA: Diagnosis not present

## 2020-07-04 DIAGNOSIS — M9903 Segmental and somatic dysfunction of lumbar region: Secondary | ICD-10-CM | POA: Diagnosis not present

## 2020-07-04 DIAGNOSIS — M9902 Segmental and somatic dysfunction of thoracic region: Secondary | ICD-10-CM | POA: Diagnosis not present

## 2020-07-10 DIAGNOSIS — M545 Low back pain: Secondary | ICD-10-CM | POA: Diagnosis not present

## 2020-07-10 DIAGNOSIS — M9901 Segmental and somatic dysfunction of cervical region: Secondary | ICD-10-CM | POA: Diagnosis not present

## 2020-07-10 DIAGNOSIS — M9903 Segmental and somatic dysfunction of lumbar region: Secondary | ICD-10-CM | POA: Diagnosis not present

## 2020-07-10 DIAGNOSIS — M9902 Segmental and somatic dysfunction of thoracic region: Secondary | ICD-10-CM | POA: Diagnosis not present

## 2020-07-21 DIAGNOSIS — M9902 Segmental and somatic dysfunction of thoracic region: Secondary | ICD-10-CM | POA: Diagnosis not present

## 2020-07-21 DIAGNOSIS — M545 Low back pain: Secondary | ICD-10-CM | POA: Diagnosis not present

## 2020-07-21 DIAGNOSIS — M9901 Segmental and somatic dysfunction of cervical region: Secondary | ICD-10-CM | POA: Diagnosis not present

## 2020-07-21 DIAGNOSIS — M9903 Segmental and somatic dysfunction of lumbar region: Secondary | ICD-10-CM | POA: Diagnosis not present

## 2020-07-24 ENCOUNTER — Other Ambulatory Visit: Payer: Self-pay | Admitting: Internal Medicine

## 2020-07-24 DIAGNOSIS — M9901 Segmental and somatic dysfunction of cervical region: Secondary | ICD-10-CM | POA: Diagnosis not present

## 2020-07-24 DIAGNOSIS — M545 Low back pain: Secondary | ICD-10-CM | POA: Diagnosis not present

## 2020-07-24 DIAGNOSIS — M9903 Segmental and somatic dysfunction of lumbar region: Secondary | ICD-10-CM | POA: Diagnosis not present

## 2020-07-24 DIAGNOSIS — M9902 Segmental and somatic dysfunction of thoracic region: Secondary | ICD-10-CM | POA: Diagnosis not present

## 2020-07-28 DIAGNOSIS — M9902 Segmental and somatic dysfunction of thoracic region: Secondary | ICD-10-CM | POA: Diagnosis not present

## 2020-07-28 DIAGNOSIS — M9903 Segmental and somatic dysfunction of lumbar region: Secondary | ICD-10-CM | POA: Diagnosis not present

## 2020-07-28 DIAGNOSIS — M9901 Segmental and somatic dysfunction of cervical region: Secondary | ICD-10-CM | POA: Diagnosis not present

## 2020-07-28 DIAGNOSIS — M545 Low back pain: Secondary | ICD-10-CM | POA: Diagnosis not present

## 2020-07-29 IMAGING — MR MRI HEAD WITHOUT CONTRAST
10 series · 36 of 48 positions shown · non-contrast
Comparison: 05/30/2015

CLINICAL DATA: Dizziness and nausea.

EXAM:
MRI HEAD WITHOUT CONTRAST
TECHNIQUE: Multiplanar, multiecho pulse sequences of the brain and surrounding
structures were obtained without intravenous contrast.

[Series 3: DWI · axial · 3.0mm · 1.09mm/px · z∈[-49,+108]mm · 9 of 108 slices shown (1 of 4)]
[im 1/108]
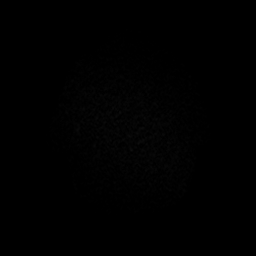
[im 14/108]
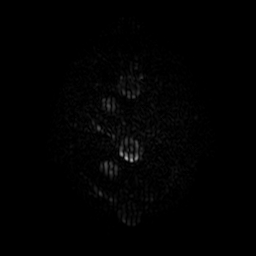
[im 27/108]
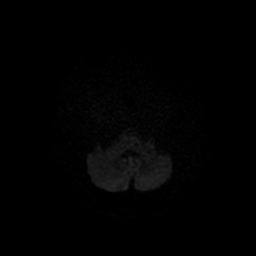
[im 41/108]
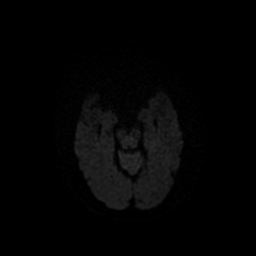
[im 54/108]
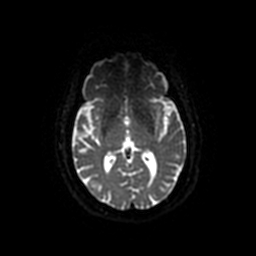
[im 67/108]
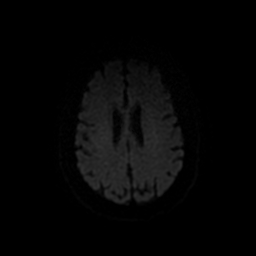
[im 81/108]
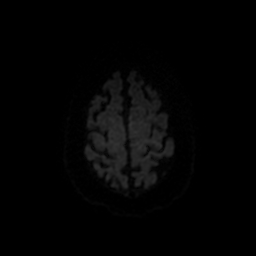
[im 94/108]
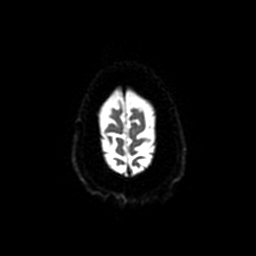
[im 108/108]
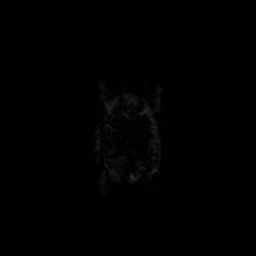

[Series 4: T1 · sagittal · 5.0mm · 0.47mm/px · 2 of 24 slices shown]
[im 1/24]
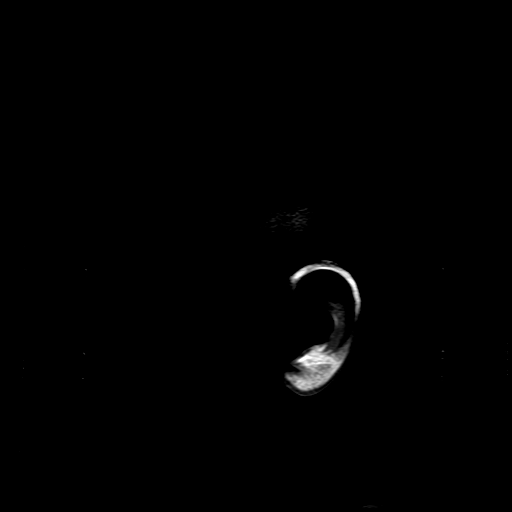
[im 24/24]
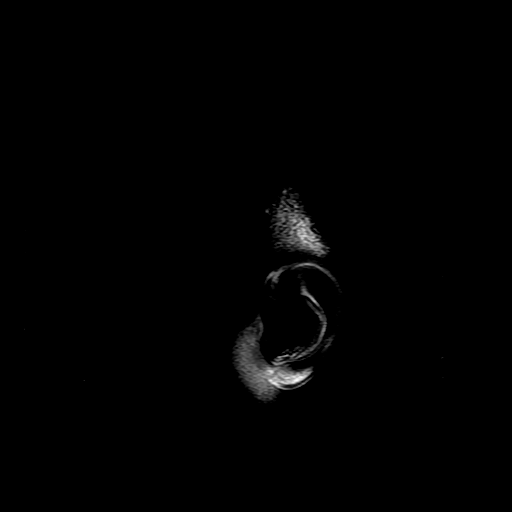

[Series 5: T2 · axial · 5.0mm · 0.43mm/px · z∈[-57,+107]mm · 2 of 25 slices shown (1 of 2)]
[im 1/25]
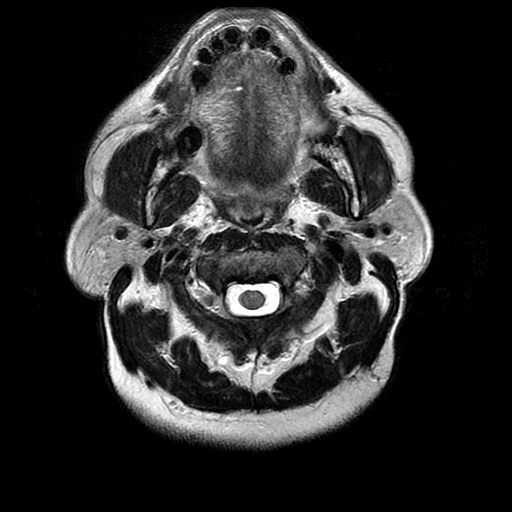
[im 25/25]
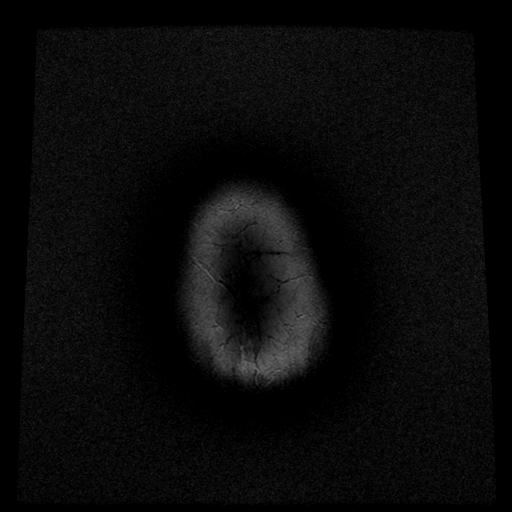

[Series 6: FLAIR · axial · 3.0mm · 0.43mm/px · z∈[-47,+106]mm · 2 of 27 slices shown]
[im 1/27]
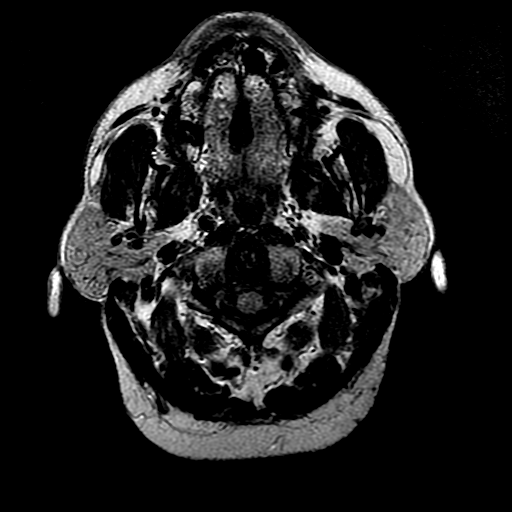
[im 27/27]
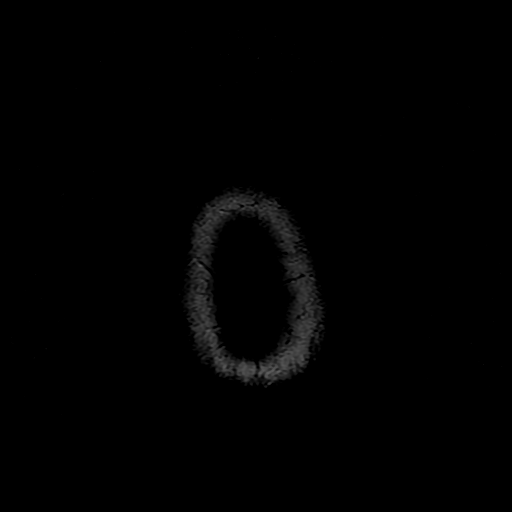

[Series 7: ax mpgr · axial · 3.0mm · 0.43mm/px · z∈[-43,+114]mm · 3 of 33 slices shown]
[im 1/33]
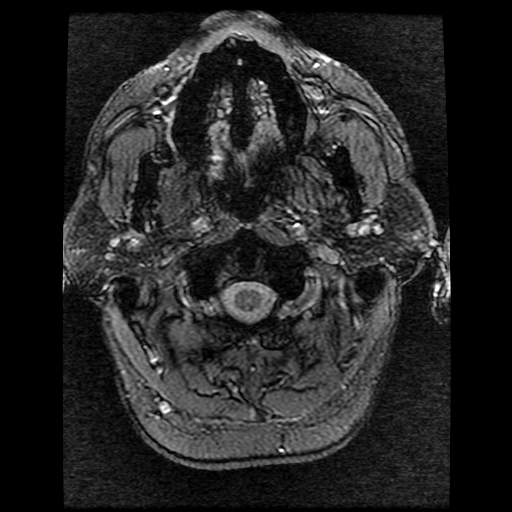
[im 17/33]
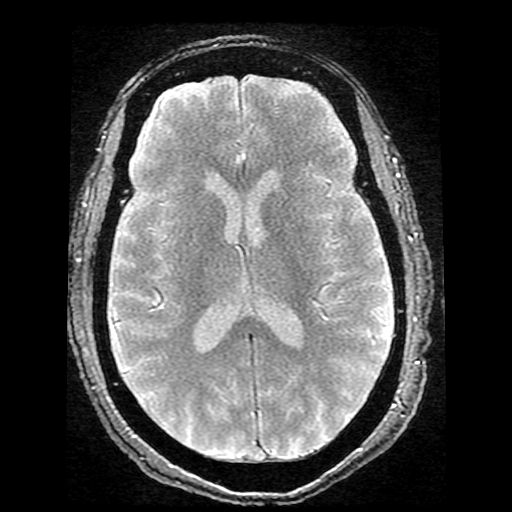
[im 33/33]
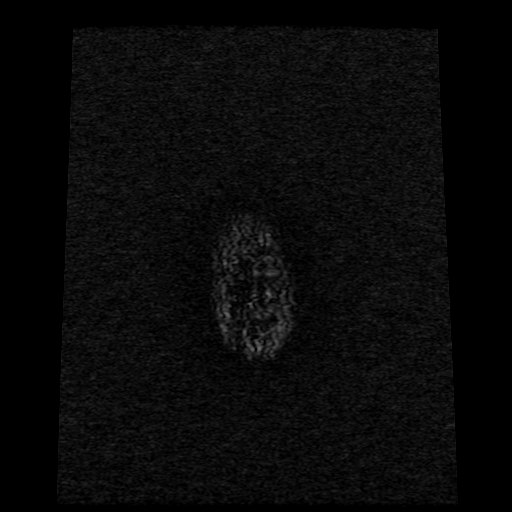

[Series 8: ax fspgr irp · axial · 1.0mm · 0.47mm/px · 1 of 166 slices shown]
[im 1/166]
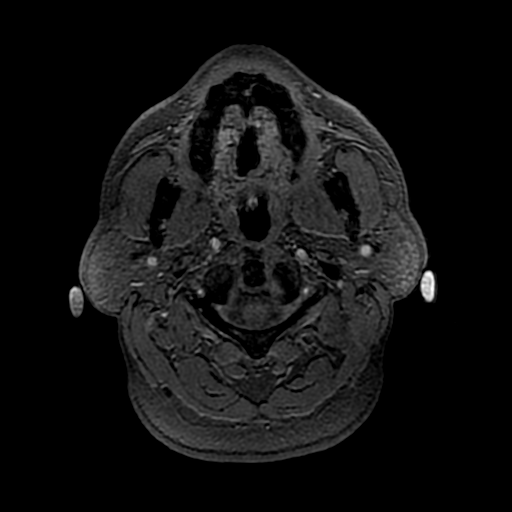

[Series 9: DWI · coronal · 4.0mm · 1.09mm/px · 7 of 88 slices shown (2 of 4)]
[im 1/88]
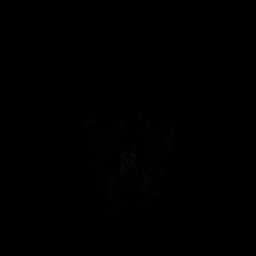
[im 15/88]
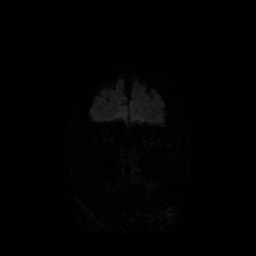
[im 30/88]
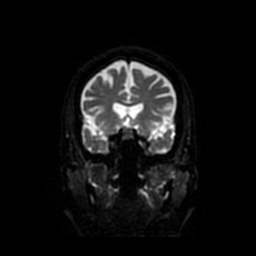
[im 44/88]
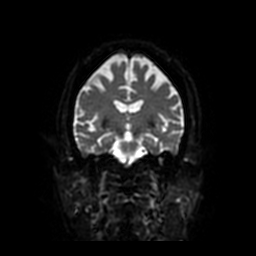
[im 59/88]
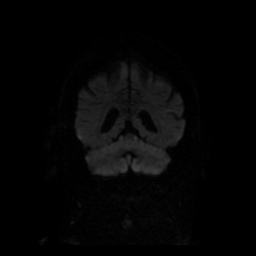
[im 73/88]
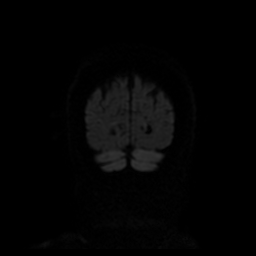
[im 88/88]
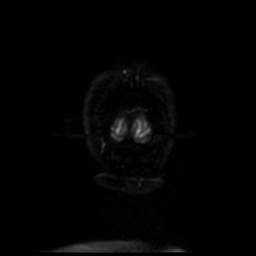

[Series 10: T2 · coronal · 5.0mm · 0.45mm/px · 2 of 29 slices shown (2 of 2)]
[im 1/29]
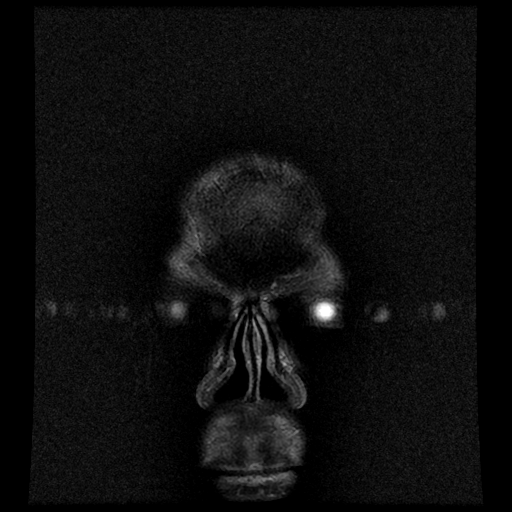
[im 29/29]
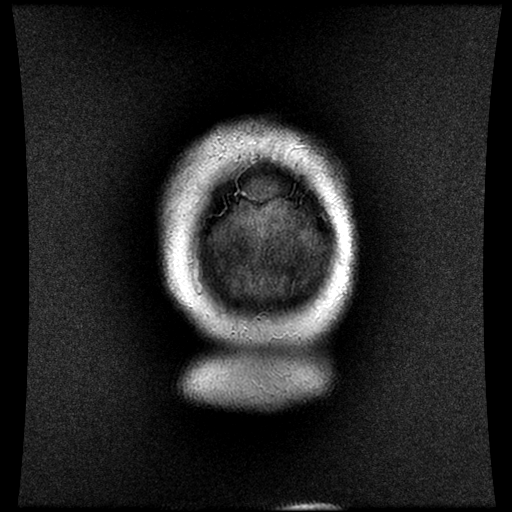

[Series 300: DWI · axial · 3.0mm · 1.09mm/px · z∈[-49,+108]mm · 4 of 54 slices shown (3 of 4)]
[im 1/54]
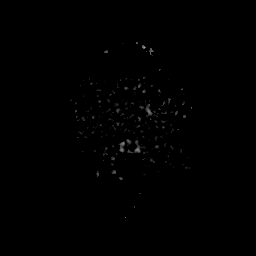
[im 18/54]
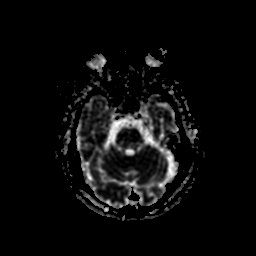
[im 36/54]
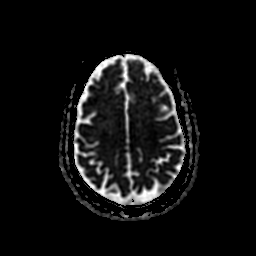
[im 54/54]
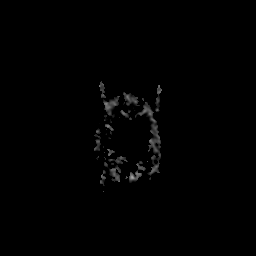

[Series 900: DWI · coronal · 4.0mm · 1.09mm/px · 4 of 44 slices shown (4 of 4)]
[im 1/44]
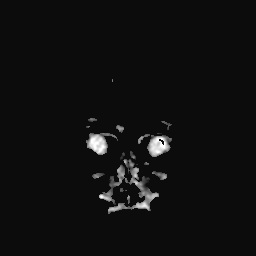
[im 15/44]
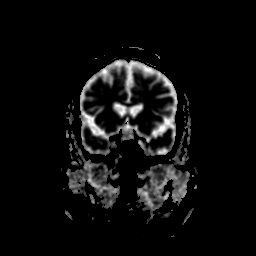
[im 29/44]
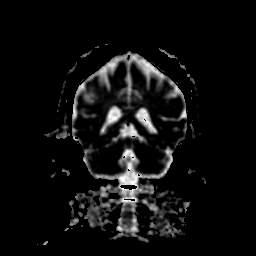
[im 44/44]
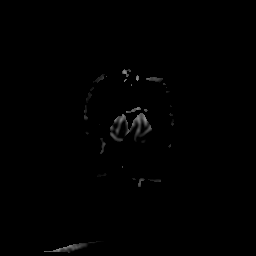

[36 of 48 positions shown; findings below may reference images not displayed]

FINDINGS: Brain: There is no evidence of acute infarct, intracranial
hemorrhage, mass, midline shift, or extra-axial fluid collection.
Mild cerebral atrophy is within normal limits for age. Scattered
small foci of T2 hyperintensity in the cerebral white matter similar
to the prior MRI and nonspecific but compatible with minimal chronic
small vessel ischemic disease. The cerebellum is unremarkable.

Vascular: Major intracranial vascular flow voids are preserved.

Skull and upper cervical spine: Unremarkable bone marrow signal.

Sinuses/Orbits: Bilateral cataract extraction. Paranasal sinuses and
mastoid air cells are clear.

Other: None.
IMPRESSION: 1. No acute intracranial abnormality.
2. Minimal chronic small vessel ischemic disease.

## 2020-08-04 DIAGNOSIS — M9902 Segmental and somatic dysfunction of thoracic region: Secondary | ICD-10-CM | POA: Diagnosis not present

## 2020-08-04 DIAGNOSIS — M9903 Segmental and somatic dysfunction of lumbar region: Secondary | ICD-10-CM | POA: Diagnosis not present

## 2020-08-04 DIAGNOSIS — M9901 Segmental and somatic dysfunction of cervical region: Secondary | ICD-10-CM | POA: Diagnosis not present

## 2020-08-04 DIAGNOSIS — M545 Low back pain: Secondary | ICD-10-CM | POA: Diagnosis not present

## 2020-08-04 DIAGNOSIS — M9904 Segmental and somatic dysfunction of sacral region: Secondary | ICD-10-CM | POA: Diagnosis not present

## 2020-08-08 ENCOUNTER — Ambulatory Visit (INDEPENDENT_AMBULATORY_CARE_PROVIDER_SITE_OTHER): Payer: Medicare Other | Admitting: Internal Medicine

## 2020-08-08 ENCOUNTER — Other Ambulatory Visit: Payer: Self-pay

## 2020-08-08 ENCOUNTER — Encounter: Payer: Self-pay | Admitting: Internal Medicine

## 2020-08-08 DIAGNOSIS — J4550 Severe persistent asthma, uncomplicated: Secondary | ICD-10-CM | POA: Diagnosis not present

## 2020-08-08 NOTE — Patient Instructions (Signed)
I very strongly recommend you get the moderna or pfizer vaccine as soon as possible based on your risk of dying from the virus  and the proven safety and benefit of these vaccines against even the delta variant.  This can save your life as well as  those of your loved ones,  especially if they are also not vaccinated.      See calendar for specific medication instructions and bring it back for each and every office visit for every healthcare provider you see.  Without it,  you may not receive the best quality medical care that we feel you deserve.  You will note that the calendar groups together  your maintenance  medications that are timed at particular times of the day.  Think of this as your checklist for what your doctor has instructed you to do until your next evaluation to see what benefit  there is  to staying on a consistent group of medications intended to keep you well.  The other group at the bottom is entirely up to you to use as you see fit  for specific symptoms that may arise between visits that require you to treat them on an as needed basis.  Think of this as your action plan or "what if" list.   Separating the top medications from the bottom group is fundamental to providing you adequate care going forward.     Please schedule a follow up visit in 3 months but call sooner if needed

## 2020-08-08 NOTE — Progress Notes (Signed)
Subjective:   Patient ID: [name]    DOB: [birth year]     Brief patient profile: 60  yobm denies ever smoking but has lifelong asthma with severe chronic pattern and best FEV1 of around 2.2 liters documented 2002 and also documented non-adherence  followed in pulmonary for primary care also for hbp and hyperlipidemia.   History of Present Illness    07/30/13 Allergy eval rx completed x 4 years > d/c'd and f/u allergy prn    04/03/2015 Follow up : chronic asthma/ HTN/non adherence / relies on samples/ no formulary / has med calendar  Pt returns for 1 month follow up .  Doing well with no asthma flare , on Dulera . Marland Kitchen  Need to establish with dentist > multiple teeth extracted   05/06/2016 NP Follow up ; Severe chronic asthma  Patient presents for 1 month follow up and med review We reviewed all his medications organize them into a medication count with patient education Appears he is taking medications correctly. However, does depend on samples. He did not have his Pepcid or Protonix today but says he has them at home. Says overall his breathing is doing well without any flare cough or wheezing. Seen by allergy Dr. Annamaria Boots on 04/23/16   , IgE 203, elevated eos Looking into Nucala.  Prevnar and PVX utd.  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 Is able to sing without sob.  rec No change rx   06/04/16 Dr Annamaria Boots rec Nucala > could not afford deductible       03/26/2019  f/u ov/Etsuko Dierolf re:  Chronic asthma/ new BPV better with meclizine / maint on symbicort 160 2bid but sample dep/ Prednisone is plan D but not using  Chief Complaint  Patient presents with  . Hospitalization Follow-up    Dizziness. Patient stated he has not been having any issues with his breathing.  Dyspnea:  Not limited by breathing from desired activities  /very sedentary / nl pace flat surfaces/ shopping ok  Cough: none Sleeping: bed flat  1-2 pillows  SABA use: neb once daily due to allergies but has not  started prednisone yet  02: none  rec Plan A = Automatic = symbicort 160 Take 2 puffs first thing in am and then another 2 puffs about 12 hours later.  Work on Education officer, community B = Backup Plan C = Crisis - only use your albuterol nebulizer if you first try Plan B and it fails to help Plan D = Deltasone  If doing ABC and not improving then prednisone 10 mg x 2 until better then 1 daily x 3 days and stop Plan E = ER - go to ER or call 911 if all else fails     04/19/2019  f/u ov/Shanai Lartigue re: chronic asthma  Chief Complaint  Patient presents with  . Follow-up    Breathing is doing well today. He has not been using his albuterol inhaler or neb.  Dyspnea:  MMRC2 = can't walk a nl pace on a flat grade s sob but does fine slow and flat e Cough: clearing thorat a lot  Sleeping: ok flat/ one pillow SABA use: only has neb, hasn't used in a while  02: none Does not recall last time he needed pred as plan C rec Plan A = Automatic = symbicort 160 Take 2 puffs first thing in am and then another 2 puffs about 12 hours later.  Work on inhaler technique:  Plan B = albuterol inhaler but you don't have one so skip this step Plan C = Crisis - only use your albuterol nebulizer -ok to use the nebulizer up to every 4 hours but if start needing it regularly call for immediate appointment Plan D = Deltasone If need to use the nebulizer more than you do now then take prednsione 10 m x 2 each am until better then 1 daily  Plan E = ER - go to ER or call 911 if all else fails   Please schedule a follow up office visit in 4 weeks, sooner if needed  with all medications /inhalers/ solutions in hand so we can verify exactly what you are taking. This includes all medications from all doctors and over the counters - has 30 day samples of symbicort 160 with about 30 left on present rx     05/21/2019  f/u ov/Mirage Pfefferkorn re: confused again re action plan / the one med he doesn't have on him his his rescue saba  Chief  Complaint  Patient presents with  . Follow-up    Breathing is overall doinwell well. He is using his ventolin about 2 x per day and has not used his neb lately. Needs refills on pepcid and meclizine.   Dyspnea:  MMRC2 = can't walk a nl pace on a flat grade s sob but does fine slow and flat  Cough: none Sleeping: lie flat ok  SABA use: twice daily whether he needs it or not  02: none  rec Plan A = Automatic = symbicort 160 Take 2 puffs first thing in am and then another 2 puffs about 12 hours later.  Work on Orthoptist B = Backup Only use your albuterol inhaler as a rescue medication Plan C = Crisis - only use your albuterol nebulizer if you first try Plan B and it fails to help > ok to use the nebulizer up to every 4 hours but if start needing it regularly call for immediate appointment Plan D = Deltasone If need to use the nebulizer more than you do now then take prednsione 10 m x 2 each am until better then 1 daily  X 5 day and stop  Please schedule a follow up office visit in 4 weeks, sooner if needed  with all medications /inhalers/ solutions in hand so we can verify exactly what you are taking. This includes all medications from all doctors and over the counters     09/29/2019  f/u ov/Tadarrius Burch re: chronic severe asthma/ counts off by 30 again Chief Complaint  Patient presents with  . Follow-up    Breathing is doing well and he denies any new co's. He has not had to use his pred or albuterol inhaler or neb.   Dyspnea:  MMRC2 = can't walk a nl pace on a flat grade s sob but does fine slow and flat  Cough: none  Sleeping: fine flat  SABA use: none / also no recent need for short term prednisone 02: no rec No change medications - ok to leave off pepcid  See calendar for specific medication instructions   01/25/2020  f/u ov/Marketa Midkiff re: chronic severe asthma/ does not know drug formulary  Chief Complaint  Patient presents with  . Follow-up    Breathing has been doing well.  He has not had to use his albuterol inhaler or neb lately. Has occ left side pain.   Dyspnea:  No change = MMRC2 = can't walk a nl  pace on a flat grade s sob but does fine slow and flat  Cough: no  Sleeping: ok flat  SABA use: none  02: none  New problem is boil in L groin x sev weeks, already drained on it's own rec warm compresses   NP eval 05/08/20  rec Continue Symbicort 2 puffs in the morning and 2 puffs in the evening (rinse mouth after use) Added Singulair 10 mg in the evening Continue over-the-counter nasal spray   08/08/2020  f/u ov/Glenice Ciccone re:  Chronic severe asthma/  No vaccination  Chief Complaint  Patient presents with  . Follow-up    pt is for asthma no complaints  Dyspnea:  MMRC2 = can't walk a nl pace on a flat grade s sob but does fine slow and flat   Cough: no Sleeping: able to lie flat  SABA use: rare proair 02: none    No obvious day to day or daytime variability or assoc excess/ purulent sputum or mucus plugs or hemoptysis or cp or chest tightness, subjective wheeze or overt sinus or hb symptoms.   Sleeping  without nocturnal  or early am exacerbation  of respiratory  c/o's or need for noct saba. Also denies any obvious fluctuation of symptoms with weather or environmental changes or other aggravating or alleviating factors except as outlined above   No unusual exposure hx or h/o childhood pna/ asthma or knowledge of premature birth.  Current Allergies, Complete Past Medical History, Past Surgical History, Family History, and Social History were reviewed in [facility] record.  ROS  The following are not active complaints unless bolded Hoarseness, sore throat, dysphagia, dental problems, itching, sneezing,  nasal congestion or discharge of excess mucus or purulent secretions, ear ache,   fever, chills, sweats, unintended wt loss or wt gain, classically pleuritic or exertional cp,  orthopnea pnd or arm/hand swelling  or leg swelling,  presyncope, palpitations, abdominal pain, anorexia, nausea, vomiting, diarrhea  or change in bowel habits or change in bladder habits, change in stools or change in urine, dysuria, hematuria,  rash, arthralgias, visual complaints, headache, numbness, weakness or ataxia or problems with walking or coordination,  change in mood or  memory.        Current Meds  Medication Sig  . albuterol (PROAIR HFA) 108 (90 Base) MCG/ACT inhaler 2 puffs every 4 hours as needed for wheezing ((PLANB))  . albuterol (PROVENTIL) (2.5 MG/3ML) 0.083% nebulizer solution USE 1 VIAL IN NEBULIZER EVERY 4 HOURS AS NEEDED  . Ascorbic Acid (VITAMIN C) 1000 MG tablet Take 1,000 mg by mouth daily.  Marland Kitchen aspirin 81 MG EC tablet Take 81 mg by mouth every morning.   . budesonide-formoterol (SYMBICORT) 160-4.5 MCG/ACT inhaler Take 2 puffs first thing in am and then another 2 puffs about 12 hours later.  . CHELATED MAGNESIUM PO Take 1 tablet by mouth daily.  . cholecalciferol (VITAMIN D3) 25 MCG (1000 UT) tablet Take 1,000 Units by mouth daily.  . cyanocobalamin 1000 MCG tablet Take 1,000 mcg by mouth daily.  . famotidine (PEPCID) 20 MG tablet Take 20 mg by mouth daily.   Marland Kitchen guaifenesin (HUMIBID E) 400 MG TABS tablet Take 400 mg by mouth every 4 (four) hours as needed.  . Ibuprofen 200 MG CAPS Take 200 mg by mouth every 6 (six) hours as needed (headache/pain).   . meclizine (ANTIVERT) 12.5 MG tablet TAKE 1 TABLET BY MOUTH 4 TIMES A DAY AS NEEDED FOR DIZZINESS  . montelukast (SINGULAIR) 10 MG  tablet Take 1 tablet (10 mg total) by mouth at bedtime.  Marland Kitchen oxymetazoline (AFRIN) 0.05 % nasal spray Place 1 spray into both nostrils 2 (two) times daily as needed for congestion.  . Polyvinyl Alcohol-Povidone (REFRESH OP) Apply 1 drop to eye as needed (dry eyes).   . predniSONE (DELTASONE) 10 MG tablet 2 daily until better, then 1 x 3 days and stop  . Respiratory Therapy Supplies (FLUTTER) DEVI Use every 4 hours as needed for cough and congestion                Past Medical Hx ASTHMA (ICD-493.90)  --chronic severe asthma with best FEV1 2.2 L documented 01/2001 and  documented non-adherence Mainly lives off samples of inhalers and nose sprays  - HFA 75% June 15, 2009 > 90% August 02, 2009 > 100% December 27, 2009  - Off allergy vaccines x7/2010 > highly allergic skin tests per Dr Annamaria Boots > restarted vaccine November 06, 2009 > stopped  07/2013  SHOULDER PAIN, RIGHT (ICD-719.41)   HYPERTENSION, BORDERLINE (ICD-401.9)  Shingles T 8 Left 08/2000 HEALTH MAINTENANCE...............................................................................................Marland KitchenWert  - Colonoscopy 12/1989,  2011  - dT 07/25/2014  - Pneumovax 02/2003 and 05/2009 - Prevnar 07/25/14 -Flu declined October 17, 2008 and September 21, 2009 , declined flu shot 2015, 2017  -CPX 07/21/2019            Objective:  Physical Exam      08/08/2020   187 01/25/2020    193  09/29/2019  194  05/23/2014  201> 201 07/04/2014 > 07/25/14  196>198 08/22/2014 >194 09/19/2014 >198 10/11/2014 > 10/24/2014  196>   01/02/2015 193 > 03/06/2015  198 >198 04/03/2015 > 05/16/2015 196 >  06/02/2015 196 >197 06/16/2015 > 12/12/2015 199> 197 01/08/2016 >   02/16/2016     194 > 04/08/2016  193   Vital signs reviewed  08/08/2020  - Note at rest 02 sats  92% on RA      HEENT : pt wearing mask not removed for exam due to covid -19 concerns.    NECK :  without JVD/Nodes/TM/ nl carotid upstrokes bilaterally   LUNGS: no acc muscle use,  Mod barrel  contour chest wall with bilateral  Distant bs s audible wheeze and  without cough on insp or exp maneuvers and mod  Hyperresonant  to  percussion bilaterally     CV:  RRR  no s3 or murmur or increase in P2, and no edema   ABD:  soft and nontender with pos mid insp Hoover's  in the supine position. No bruits or organomegaly appreciated, bowel sounds nl  MS:     ext warm without deformities, calf tenderness, cyanosis or clubbing No obvious joint restrictions   SKIN:  warm and dry without lesions    NEURO:  alert, approp, nl sensorium with  no motor or cerebellar deficits apparent.

## 2020-08-09 ENCOUNTER — Encounter: Payer: Self-pay | Admitting: Internal Medicine

## 2020-08-09 NOTE — Assessment & Plan Note (Addendum)
Onset in childhood  --chronic severe asthma with best FEV1 2.2 L documented 01/2001 and documented non-adherence Mainly lives off samples of inhalers and nose sprays  - Off allergy vaccines x7/2010 > highly allergic skin tests per Dr Annamaria Boots > restart vaccine November 06, 2009>stop 07/30/13 to watch - Spirometry on "good day" 01/14/12 =  FEV1 1.54  Ratio 0.58  - 02/16/2016 added prn prednisone when needing neb - FENO 04/23/16- 32  -CBC with differential 04/23/2016-eosinophils elevated 7%  - Spirometry 01/28/2017  FEV1 1.23 (49%)  Ratio 60    - Spirometry 08/11/2018  FEV1 1.43 (60%)  Ratio 60  - FENO 08/11/2018  =   30 on symb 160 2bid  - 09/29/2019  After extensive coaching inhaler device,  effectiveness =    90% from baseline 75% (short Ti)    No limiting symptoms or flares > no change in rx  Pt informed of the seriousness of COVID 19 infection as a direct risk to lung health  and safey and to close contacts and should continue to wear a facemask in public and minimize exposure to public locations but especially avoid any area or activity where non-close contacts are not observing distancing or wearing an appropriate face mask.  I strongly recommended she take either of the vaccines available through local drugstores based on updated information on millions of Americans treated with the [employer] products  which have proven both safe and  effective even against the new delta variant.      Each maintenance medication was reviewed in detail including most importantly the difference between maintenance and as needed and under what circumstances the prns are to be used. This was done in the context of a medication calendar review which provided the patient with a user-friendly unambiguous mechanism for medication administration and reconciliation and provides an action plan for all active problems. It is critical that this be shown to every doctor  for modification during the office visit if necessary so  the patient can use it as a working document.     F/u q 3 m

## 2020-08-10 DIAGNOSIS — M9903 Segmental and somatic dysfunction of lumbar region: Secondary | ICD-10-CM | POA: Diagnosis not present

## 2020-08-10 DIAGNOSIS — M9902 Segmental and somatic dysfunction of thoracic region: Secondary | ICD-10-CM | POA: Diagnosis not present

## 2020-08-10 DIAGNOSIS — M9904 Segmental and somatic dysfunction of sacral region: Secondary | ICD-10-CM | POA: Diagnosis not present

## 2020-08-10 DIAGNOSIS — M545 Low back pain: Secondary | ICD-10-CM | POA: Diagnosis not present

## 2020-08-10 DIAGNOSIS — M9901 Segmental and somatic dysfunction of cervical region: Secondary | ICD-10-CM | POA: Diagnosis not present

## 2020-08-16 DIAGNOSIS — M9903 Segmental and somatic dysfunction of lumbar region: Secondary | ICD-10-CM | POA: Diagnosis not present

## 2020-08-16 DIAGNOSIS — M9901 Segmental and somatic dysfunction of cervical region: Secondary | ICD-10-CM | POA: Diagnosis not present

## 2020-08-16 DIAGNOSIS — M9902 Segmental and somatic dysfunction of thoracic region: Secondary | ICD-10-CM | POA: Diagnosis not present

## 2020-08-16 DIAGNOSIS — M62830 Muscle spasm of back: Secondary | ICD-10-CM | POA: Diagnosis not present

## 2020-08-22 DIAGNOSIS — M9901 Segmental and somatic dysfunction of cervical region: Secondary | ICD-10-CM | POA: Diagnosis not present

## 2020-08-22 DIAGNOSIS — M9903 Segmental and somatic dysfunction of lumbar region: Secondary | ICD-10-CM | POA: Diagnosis not present

## 2020-08-22 DIAGNOSIS — M9902 Segmental and somatic dysfunction of thoracic region: Secondary | ICD-10-CM | POA: Diagnosis not present

## 2020-08-31 DIAGNOSIS — M9901 Segmental and somatic dysfunction of cervical region: Secondary | ICD-10-CM | POA: Diagnosis not present

## 2020-08-31 DIAGNOSIS — M9903 Segmental and somatic dysfunction of lumbar region: Secondary | ICD-10-CM | POA: Diagnosis not present

## 2020-08-31 DIAGNOSIS — M9902 Segmental and somatic dysfunction of thoracic region: Secondary | ICD-10-CM | POA: Diagnosis not present

## 2020-09-06 DIAGNOSIS — M9902 Segmental and somatic dysfunction of thoracic region: Secondary | ICD-10-CM | POA: Diagnosis not present

## 2020-09-06 DIAGNOSIS — M9901 Segmental and somatic dysfunction of cervical region: Secondary | ICD-10-CM | POA: Diagnosis not present

## 2020-09-06 DIAGNOSIS — M9903 Segmental and somatic dysfunction of lumbar region: Secondary | ICD-10-CM | POA: Diagnosis not present

## 2020-09-07 DIAGNOSIS — M9901 Segmental and somatic dysfunction of cervical region: Secondary | ICD-10-CM | POA: Diagnosis not present

## 2020-09-07 DIAGNOSIS — M9903 Segmental and somatic dysfunction of lumbar region: Secondary | ICD-10-CM | POA: Diagnosis not present

## 2020-09-07 DIAGNOSIS — M9902 Segmental and somatic dysfunction of thoracic region: Secondary | ICD-10-CM | POA: Diagnosis not present

## 2020-09-14 DIAGNOSIS — M9902 Segmental and somatic dysfunction of thoracic region: Secondary | ICD-10-CM | POA: Diagnosis not present

## 2020-09-14 DIAGNOSIS — M9901 Segmental and somatic dysfunction of cervical region: Secondary | ICD-10-CM | POA: Diagnosis not present

## 2020-09-14 DIAGNOSIS — M9903 Segmental and somatic dysfunction of lumbar region: Secondary | ICD-10-CM | POA: Diagnosis not present

## 2020-09-15 ENCOUNTER — Other Ambulatory Visit: Payer: Self-pay | Admitting: Internal Medicine

## 2020-09-15 NOTE — Telephone Encounter (Signed)
Dr. Wert, please advise if you are okay refilling med. 

## 2020-09-19 DIAGNOSIS — M9901 Segmental and somatic dysfunction of cervical region: Secondary | ICD-10-CM | POA: Diagnosis not present

## 2020-09-19 DIAGNOSIS — M545 Low back pain: Secondary | ICD-10-CM | POA: Diagnosis not present

## 2020-09-19 DIAGNOSIS — M9902 Segmental and somatic dysfunction of thoracic region: Secondary | ICD-10-CM | POA: Diagnosis not present

## 2020-09-19 DIAGNOSIS — M9903 Segmental and somatic dysfunction of lumbar region: Secondary | ICD-10-CM | POA: Diagnosis not present

## 2020-09-28 DIAGNOSIS — M9903 Segmental and somatic dysfunction of lumbar region: Secondary | ICD-10-CM | POA: Diagnosis not present

## 2020-09-28 DIAGNOSIS — M9901 Segmental and somatic dysfunction of cervical region: Secondary | ICD-10-CM | POA: Diagnosis not present

## 2020-09-28 DIAGNOSIS — M9902 Segmental and somatic dysfunction of thoracic region: Secondary | ICD-10-CM | POA: Diagnosis not present

## 2020-10-05 DIAGNOSIS — M9901 Segmental and somatic dysfunction of cervical region: Secondary | ICD-10-CM | POA: Diagnosis not present

## 2020-10-05 DIAGNOSIS — M9902 Segmental and somatic dysfunction of thoracic region: Secondary | ICD-10-CM | POA: Diagnosis not present

## 2020-10-05 DIAGNOSIS — M9903 Segmental and somatic dysfunction of lumbar region: Secondary | ICD-10-CM | POA: Diagnosis not present

## 2020-10-05 DIAGNOSIS — N528 Other male erectile dysfunction: Secondary | ICD-10-CM | POA: Diagnosis not present

## 2020-10-05 DIAGNOSIS — N489 Disorder of penis, unspecified: Secondary | ICD-10-CM | POA: Diagnosis not present

## 2020-10-09 ENCOUNTER — Encounter: Payer: Self-pay | Admitting: Internal Medicine

## 2020-10-12 DIAGNOSIS — M9903 Segmental and somatic dysfunction of lumbar region: Secondary | ICD-10-CM | POA: Diagnosis not present

## 2020-10-12 DIAGNOSIS — M9901 Segmental and somatic dysfunction of cervical region: Secondary | ICD-10-CM | POA: Diagnosis not present

## 2020-10-12 DIAGNOSIS — M9902 Segmental and somatic dysfunction of thoracic region: Secondary | ICD-10-CM | POA: Diagnosis not present

## 2020-10-23 DIAGNOSIS — M9901 Segmental and somatic dysfunction of cervical region: Secondary | ICD-10-CM | POA: Diagnosis not present

## 2020-10-23 DIAGNOSIS — M9902 Segmental and somatic dysfunction of thoracic region: Secondary | ICD-10-CM | POA: Diagnosis not present

## 2020-10-23 DIAGNOSIS — M9903 Segmental and somatic dysfunction of lumbar region: Secondary | ICD-10-CM | POA: Diagnosis not present

## 2020-11-08 ENCOUNTER — Ambulatory Visit: Payer: Medicare Other | Admitting: Internal Medicine

## 2020-11-08 DIAGNOSIS — M9903 Segmental and somatic dysfunction of lumbar region: Secondary | ICD-10-CM | POA: Diagnosis not present

## 2020-11-08 DIAGNOSIS — M9902 Segmental and somatic dysfunction of thoracic region: Secondary | ICD-10-CM | POA: Diagnosis not present

## 2020-11-08 DIAGNOSIS — M9901 Segmental and somatic dysfunction of cervical region: Secondary | ICD-10-CM | POA: Diagnosis not present

## 2020-11-15 ENCOUNTER — Ambulatory Visit (INDEPENDENT_AMBULATORY_CARE_PROVIDER_SITE_OTHER): Payer: Medicare Other | Admitting: Internal Medicine

## 2020-11-15 ENCOUNTER — Other Ambulatory Visit: Payer: Self-pay

## 2020-11-15 ENCOUNTER — Encounter: Payer: Self-pay | Admitting: Internal Medicine

## 2020-11-15 DIAGNOSIS — J4550 Severe persistent asthma, uncomplicated: Secondary | ICD-10-CM

## 2020-11-15 NOTE — Patient Instructions (Signed)
Plan A = Automatic = Always=  Symbicort 160 Take 2 puffs first thing in am and then another 2 puffs about 12 hours later.    Plan B = Backup (to supplement plan A, not to replace it) Only use your albuterol inhaler as a rescue medication to be used if you can't catch your breath by resting or doing a relaxed purse lip breathing pattern.  - The less you use it, the better it will work when you need it. - Ok to use the inhaler up to 2 puffs  every 4 hours if you must but call for appointment if use goes up over your usual need - Don't leave home without it !!  (think of it like the spare tire for your car)   Plan C = Crisis (instead of Plan B but only if Plan B stops working) - only use your albuterol nebulizer if you first try Plan B and it fails to help > ok to use the nebulizer up to every 4 hours but if start needing it regularly call for immediate appointment    I very strongly recommend you get the moderna or pfizer vaccine as soon as possible based on your risk of dying from the virus  and the proven safety and benefit of these vaccines against even the delta variant.  This can save your life as well as  those of your loved ones,  especially if they are also not vaccinated.     Please schedule a follow up visit in 6 months but call sooner if needed

## 2020-11-15 NOTE — Progress Notes (Signed)
Subjective:   Patient ID: [name]    DOB: [birth year]     Brief patient profile: 60  yobm denies ever smoking but has lifelong asthma with severe chronic pattern and best FEV1 of around 2.2 liters documented 2002 and also documented non-adherence  followed in pulmonary for primary care also for hbp and hyperlipidemia.   History of Present Illness    07/30/13 Allergy eval rx completed x 4 years > d/c'd and f/u allergy prn    04/03/2015 Follow up : chronic asthma/ HTN/non adherence / relies on samples/ no formulary / has med calendar  Pt returns for 1 month follow up .  Doing well with no asthma flare , on Dulera . Marland Kitchen  Need to establish with dentist > multiple teeth extracted   05/06/2016 NP Follow up ; Severe chronic asthma  Patient presents for 1 month follow up and med review We reviewed all his medications organize them into a medication count with patient education Appears he is taking medications correctly. However, does depend on samples. He did not have his Pepcid or Protonix today but says he has them at home. Says overall his breathing is doing well without any flare cough or wheezing. Seen by allergy Dr. Annamaria Boots on 04/23/16   , IgE 203, elevated eos Looking into Nucala.  Prevnar and PVX utd.  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 Is able to sing without sob.  rec No change rx   06/04/16 Dr Annamaria Boots rec Nucala > could not afford deductible       03/26/2019  f/u ov/Porchea Charrier re:  Chronic asthma/ new BPV better with meclizine / maint on symbicort 160 2bid but sample dep/ Prednisone is plan D but not using  Chief Complaint  Patient presents with  . Hospitalization Follow-up    Dizziness. Patient stated he has not been having any issues with his breathing.  Dyspnea:  Not limited by breathing from desired activities  /very sedentary / nl pace flat surfaces/ shopping ok  Cough: none Sleeping: bed flat  1-2 pillows  SABA use: neb once daily due to allergies but has not  started prednisone yet  02: none  rec Plan A = Automatic = symbicort 160 Take 2 puffs first thing in am and then another 2 puffs about 12 hours later.  Work on Education officer, community B = Backup Plan C = Crisis - only use your albuterol nebulizer if you first try Plan B and it fails to help Plan D = Deltasone  If doing ABC and not improving then prednisone 10 mg x 2 until better then 1 daily x 3 days and stop Plan E = ER - go to ER or call 911 if all else fails     04/19/2019  f/u ov/Reagan Klemz re: chronic asthma  Chief Complaint  Patient presents with  . Follow-up    Breathing is doing well today. He has not been using his albuterol inhaler or neb.  Dyspnea:  MMRC2 = can't walk a nl pace on a flat grade s sob but does fine slow and flat e Cough: clearing thorat a lot  Sleeping: ok flat/ one pillow SABA use: only has neb, hasn't used in a while  02: none Does not recall last time he needed pred as plan C rec Plan A = Automatic = symbicort 160 Take 2 puffs first thing in am and then another 2 puffs about 12 hours later.  Work on inhaler technique:  Plan B = albuterol inhaler but you don't have one so skip this step Plan C = Crisis - only use your albuterol nebulizer -ok to use the nebulizer up to every 4 hours but if start needing it regularly call for immediate appointment Plan D = Deltasone If need to use the nebulizer more than you do now then take prednsione 10 m x 2 each am until better then 1 daily  Plan E = ER - go to ER or call 911 if all else fails   Please schedule a follow up office visit in 4 weeks, sooner if needed  with all medications /inhalers/ solutions in hand so we can verify exactly what you are taking. This includes all medications from all doctors and over the counters - has 30 day samples of symbicort 160 with about 30 left on present rx     05/21/2019  f/u ov/Lile Mccurley re: confused again re action plan / the one med he doesn't have on him his his rescue saba  Chief  Complaint  Patient presents with  . Follow-up    Breathing is overall doinwell well. He is using his ventolin about 2 x per day and has not used his neb lately. Needs refills on pepcid and meclizine.   Dyspnea:  MMRC2 = can't walk a nl pace on a flat grade s sob but does fine slow and flat  Cough: none Sleeping: lie flat ok  SABA use: twice daily whether he needs it or not  02: none  rec Plan A = Automatic = symbicort 160 Take 2 puffs first thing in am and then another 2 puffs about 12 hours later.  Work on Orthoptist B = Backup Only use your albuterol inhaler as a rescue medication Plan C = Crisis - only use your albuterol nebulizer if you first try Plan B and it fails to help > ok to use the nebulizer up to every 4 hours but if start needing it regularly call for immediate appointment Plan D = Deltasone If need to use the nebulizer more than you do now then take prednsione 10 m x 2 each am until better then 1 daily  X 5 day and stop  Please schedule a follow up office visit in 4 weeks, sooner if needed  with all medications /inhalers/ solutions in hand so we can verify exactly what you are taking. This includes all medications from all doctors and over the counters   08/08/2020  f/u ov/[place] re:  Chronic severe asthma/  No vaccination Roney Jaffe has not needed prednisone since  Last ov Chief Complaint  Patient presents with  . Follow-up    pt is for asthma no complaints  Dyspnea:  MMRC2 = can't walk a nl pace on a flat grade s sob but does fine slow and flat   Cough: no Sleeping: able to lie flat  SABA use: rare proair 02: none  rec  I very strongly recommend you get the moderna or pfizer vaccine as soon as possible   See calendar for specific medication instructions    11/15/2020  f/u ov/Venissa Nappi re: chronic asthma / not vaccinated for covid or flu Chief Complaint  Patient presents with  . Follow-up    voiced no complaints  Dyspnea:  MMRC2 = can't walk a nl pace on a flat  grade s sob but does fine slow and flat  Cough: no  Sleeping: ok bed is flat one pillow no resp symptoms SABA use: once a  week 02: no    No obvious day to day or daytime variability or assoc excess/ purulent sputum or mucus plugs or hemoptysis or cp or chest tightness, subjective wheeze or overt sinus or hb symptoms.   Sleeping as above without nocturnal  or early am exacerbation  of respiratory  c/o's or need for noct saba. Also denies any obvious fluctuation of symptoms with weather or environmental changes or other aggravating or alleviating factors except as outlined above   No unusual exposure hx or h/o childhood pna  or knowledge of premature birth.  Current Allergies, Complete Past Medical History, Past Surgical History, Family History, and Social History were reviewed in [employer] [employer] record.  ROS  The following are not active complaints unless bolded Hoarseness, sore throat, dysphagia, dental problems, itching, sneezing,  nasal congestion or discharge of excess mucus or purulent secretions, ear ache,   fever, chills, sweats, unintended wt loss or wt gain, classically pleuritic or exertional cp,  orthopnea pnd or arm/hand swelling  or leg swelling, presyncope, palpitations, abdominal pain, anorexia, nausea, vomiting, diarrhea  or change in bowel habits or change in bladder habits, change in stools or change in urine, dysuria, hematuria,  rash, arthralgias, visual complaints, headache, numbness, weakness or ataxia or problems with walking or coordination,  change in mood or  memory.        Current Meds  Medication Sig  . albuterol (PROAIR HFA) 108 (90 Base) MCG/ACT inhaler 2 puffs every 4 hours as needed for wheezing ((PLANB))  . albuterol (PROVENTIL) (2.5 MG/3ML) 0.083% nebulizer solution USE 1 VIAL IN NEBULIZER EVERY 4 HOURS AS NEEDED  . Ascorbic Acid (VITAMIN C) 1000 MG tablet Take 1,000 mg by mouth daily.  Marland Kitchen aspirin 81 MG EC tablet Take 81 mg by mouth every  morning.   . budesonide-formoterol (SYMBICORT) 160-4.5 MCG/ACT inhaler Take 2 puffs first thing in am and then another 2 puffs about 12 hours later.  . CHELATED MAGNESIUM PO Take 1 tablet by mouth daily.  . cholecalciferol (VITAMIN D3) 25 MCG (1000 UT) tablet Take 1,000 Units by mouth daily.  . cyanocobalamin 1000 MCG tablet Take 1,000 mcg by mouth daily.  . famotidine (PEPCID) 20 MG tablet Take 20 mg by mouth daily.   Marland Kitchen guaifenesin (HUMIBID E) 400 MG TABS tablet Take 400 mg by mouth every 4 (four) hours as needed.  . Ibuprofen 200 MG CAPS Take 200 mg by mouth every 6 (six) hours as needed (headache/pain).   . meclizine (ANTIVERT) 12.5 MG tablet TAKE 1 TABLET BY MOUTH 4 TIMES A DAY AS NEEDED FOR DIZZINESS  . montelukast (SINGULAIR) 10 MG tablet Take 1 tablet (10 mg total) by mouth at bedtime.  Marland Kitchen oxymetazoline (AFRIN) 0.05 % nasal spray Place 1 spray into both nostrils 2 (two) times daily as needed for congestion.  . Polyvinyl Alcohol-Povidone (REFRESH OP) Apply 1 drop to eye as needed (dry eyes).   . predniSONE (DELTASONE) 10 MG tablet 2 daily until better, then 1 x 3 days and stop  . Respiratory Therapy Supplies (FLUTTER) DEVI Use every 4 hours as needed for cough and congestion               Past Medical Hx ASTHMA (ICD-493.90)  --chronic severe asthma with best FEV1 2.2 L documented 01/2001 and  documented non-adherence Mainly lives off samples of inhalers and nose sprays  - HFA 75% June 15, 2009 > 90% August 02, 2009 > 100% December 27, 2009  - Off allergy  vaccines x7/2010 > highly allergic skin tests per Dr Annamaria Boots > restarted vaccine November 06, 2009 > stopped  07/2013  SHOULDER PAIN, RIGHT (ICD-719.41)   HYPERTENSION, BORDERLINE (ICD-401.9)  Shingles T 8 Left 08/2000 HEALTH MAINTENANCE...............................................................................................Marland KitchenWert  - Colonoscopy 12/1989,  2011  - dT 07/25/2014  - Pneumovax 02/2003 and 05/2009 - Prevnar 07/25/14 -Flu  declined October 17, 2008 and September 21, 2009 , declined flu shot 2015, 2017  -CPX 07/21/2019            Objective:  Physical Exam     11/15/2020   184  08/08/2020   187 01/25/2020    193  09/29/2019  194  05/23/2014  201> 201 07/04/2014 > 07/25/14  196>198 08/22/2014 >194 09/19/2014 >198 10/11/2014 > 10/24/2014  196>   01/02/2015 193 > 03/06/2015  198 >198 04/03/2015 > 05/16/2015 196 >  06/02/2015 196 >197 06/16/2015 > 12/12/2015 199> 197 01/08/2016 >   02/16/2016     194 > 04/08/2016  193    Vital signs reviewed  11/15/2020  - Note at rest 02 sats  92% on RA     HEENT : pt wearing mask not removed for exam due to covid -19 concerns.    NECK :  without JVD/Nodes/TM/ nl carotid upstrokes bilaterally   LUNGS: no acc muscle use,  Mod barrel  contour chest wall with bilateral  Distant bs s audible wheeze and  without cough on insp or exp maneuvers and mod  Hyperresonant  to  percussion bilaterally     CV:  RRR  no s3 or murmur or increase in P2, and no edema   ABD:  soft and nontender with pos mid insp Hoover's  in the supine position. No bruits or organomegaly appreciated, bowel sounds nl  MS:     ext warm without deformities, calf tenderness, cyanosis or clubbing No obvious joint restrictions   SKIN: warm and dry without lesions    NEURO:  alert, approp, nl sensorium with  no motor or cerebellar deficits apparent.

## 2020-11-16 ENCOUNTER — Encounter: Payer: Self-pay | Admitting: Internal Medicine

## 2020-11-16 NOTE — Assessment & Plan Note (Addendum)
Onset in childhood  --chronic severe asthma with best FEV1 2.2 L documented 01/2001 and documented non-adherence Mainly lives off samples of inhalers and nose sprays  - Off allergy vaccines x7/2010 > highly allergic skin tests per Dr Annamaria Boots > restart vaccine November 06, 2009>stop 07/30/13 to watch - Spirometry on "good day" 01/14/12 =  FEV1 1.54  Ratio 0.58  - 02/16/2016 added prn prednisone when needing neb - FENO 04/23/16- 32  -CBC with differential 04/23/2016-eosinophils elevated 7%  - Spirometry 01/28/2017  FEV1 1.23 (49%)  Ratio 60    - Spirometry 08/11/2018  FEV1 1.43 (60%)  Ratio 60  - FENO 08/11/2018  =   30 on symb 160 2bid  - 09/29/2019  After extensive coaching inhaler device,  effectiveness =    90% from baseline 75% (short Ti)   Ok control on present rx/ confused again with meds as has two forms of saba and advised to stick with hfa as it's the same technique as symbicort  Again:  I very strongly recommend you get the moderna or pfizer vaccine as soon as possible based on your risk of dying from the virus  and the proven safety and benefit of these vaccines against even the delta variant.  This can save your life as well as  those of your loved ones,  especially if they are also not vaccinated.     F/u q 3 m, sooner prn          Each maintenance medication was reviewed in detail including emphasizing most importantly the difference between maintenance and prns and under what circumstances the prns are to be triggered using an action plan format where appropriate.  Total time for H and P, chart review, counseling,  reviewingdevices and generating customized AVS unique to this office visit / charting = 15 min

## 2020-11-28 DIAGNOSIS — M9902 Segmental and somatic dysfunction of thoracic region: Secondary | ICD-10-CM | POA: Diagnosis not present

## 2020-11-28 DIAGNOSIS — M9901 Segmental and somatic dysfunction of cervical region: Secondary | ICD-10-CM | POA: Diagnosis not present

## 2020-11-28 DIAGNOSIS — M9904 Segmental and somatic dysfunction of sacral region: Secondary | ICD-10-CM | POA: Diagnosis not present

## 2020-11-28 DIAGNOSIS — M9903 Segmental and somatic dysfunction of lumbar region: Secondary | ICD-10-CM | POA: Diagnosis not present

## 2020-12-08 DIAGNOSIS — M9902 Segmental and somatic dysfunction of thoracic region: Secondary | ICD-10-CM | POA: Diagnosis not present

## 2020-12-08 DIAGNOSIS — M9903 Segmental and somatic dysfunction of lumbar region: Secondary | ICD-10-CM | POA: Diagnosis not present

## 2020-12-08 DIAGNOSIS — M62830 Muscle spasm of back: Secondary | ICD-10-CM | POA: Diagnosis not present

## 2020-12-08 DIAGNOSIS — M9901 Segmental and somatic dysfunction of cervical region: Secondary | ICD-10-CM | POA: Diagnosis not present

## 2020-12-21 DIAGNOSIS — M9901 Segmental and somatic dysfunction of cervical region: Secondary | ICD-10-CM | POA: Diagnosis not present

## 2020-12-21 DIAGNOSIS — M545 Low back pain, unspecified: Secondary | ICD-10-CM | POA: Diagnosis not present

## 2020-12-28 DIAGNOSIS — M9902 Segmental and somatic dysfunction of thoracic region: Secondary | ICD-10-CM | POA: Diagnosis not present

## 2020-12-28 DIAGNOSIS — M9901 Segmental and somatic dysfunction of cervical region: Secondary | ICD-10-CM | POA: Diagnosis not present

## 2020-12-28 DIAGNOSIS — M9903 Segmental and somatic dysfunction of lumbar region: Secondary | ICD-10-CM | POA: Diagnosis not present

## 2021-01-01 DIAGNOSIS — M9903 Segmental and somatic dysfunction of lumbar region: Secondary | ICD-10-CM | POA: Diagnosis not present

## 2021-01-01 DIAGNOSIS — M9901 Segmental and somatic dysfunction of cervical region: Secondary | ICD-10-CM | POA: Diagnosis not present

## 2021-01-01 DIAGNOSIS — M62830 Muscle spasm of back: Secondary | ICD-10-CM | POA: Diagnosis not present

## 2021-01-01 DIAGNOSIS — M9902 Segmental and somatic dysfunction of thoracic region: Secondary | ICD-10-CM | POA: Diagnosis not present

## 2021-01-10 DIAGNOSIS — M9902 Segmental and somatic dysfunction of thoracic region: Secondary | ICD-10-CM | POA: Diagnosis not present

## 2021-01-10 DIAGNOSIS — M62830 Muscle spasm of back: Secondary | ICD-10-CM | POA: Diagnosis not present

## 2021-01-10 DIAGNOSIS — M9903 Segmental and somatic dysfunction of lumbar region: Secondary | ICD-10-CM | POA: Diagnosis not present

## 2021-01-10 DIAGNOSIS — M9901 Segmental and somatic dysfunction of cervical region: Secondary | ICD-10-CM | POA: Diagnosis not present

## 2021-01-11 ENCOUNTER — Other Ambulatory Visit: Payer: Self-pay | Admitting: *Deleted

## 2021-01-11 MED ORDER — MECLIZINE HCL 12.5 MG PO TABS: ORAL_TABLET | ORAL | 0 refills | Status: DC

## 2021-01-11 NOTE — Telephone Encounter (Signed)
Patient requesting a refill on Meclizine 12/5 mg Last ov 11/15/20 Last refill 09/15/20 # 40 no refill

## 2021-01-18 DIAGNOSIS — M9903 Segmental and somatic dysfunction of lumbar region: Secondary | ICD-10-CM | POA: Diagnosis not present

## 2021-01-18 DIAGNOSIS — M9902 Segmental and somatic dysfunction of thoracic region: Secondary | ICD-10-CM | POA: Diagnosis not present

## 2021-01-18 DIAGNOSIS — M9901 Segmental and somatic dysfunction of cervical region: Secondary | ICD-10-CM | POA: Diagnosis not present

## 2021-01-22 DIAGNOSIS — M9901 Segmental and somatic dysfunction of cervical region: Secondary | ICD-10-CM | POA: Diagnosis not present

## 2021-01-22 DIAGNOSIS — M9902 Segmental and somatic dysfunction of thoracic region: Secondary | ICD-10-CM | POA: Diagnosis not present

## 2021-01-22 DIAGNOSIS — M9903 Segmental and somatic dysfunction of lumbar region: Secondary | ICD-10-CM | POA: Diagnosis not present

## 2021-02-02 DIAGNOSIS — M9902 Segmental and somatic dysfunction of thoracic region: Secondary | ICD-10-CM | POA: Diagnosis not present

## 2021-02-02 DIAGNOSIS — M9901 Segmental and somatic dysfunction of cervical region: Secondary | ICD-10-CM | POA: Diagnosis not present

## 2021-02-02 DIAGNOSIS — M9903 Segmental and somatic dysfunction of lumbar region: Secondary | ICD-10-CM | POA: Diagnosis not present

## 2021-03-02 DIAGNOSIS — M9904 Segmental and somatic dysfunction of sacral region: Secondary | ICD-10-CM | POA: Diagnosis not present

## 2021-03-02 DIAGNOSIS — M9901 Segmental and somatic dysfunction of cervical region: Secondary | ICD-10-CM | POA: Diagnosis not present

## 2021-03-02 DIAGNOSIS — M9902 Segmental and somatic dysfunction of thoracic region: Secondary | ICD-10-CM | POA: Diagnosis not present

## 2021-03-02 DIAGNOSIS — M9903 Segmental and somatic dysfunction of lumbar region: Secondary | ICD-10-CM | POA: Diagnosis not present

## 2021-03-05 DIAGNOSIS — M62830 Muscle spasm of back: Secondary | ICD-10-CM | POA: Diagnosis not present

## 2021-03-05 DIAGNOSIS — M9903 Segmental and somatic dysfunction of lumbar region: Secondary | ICD-10-CM | POA: Diagnosis not present

## 2021-03-05 DIAGNOSIS — M9901 Segmental and somatic dysfunction of cervical region: Secondary | ICD-10-CM | POA: Diagnosis not present

## 2021-03-05 DIAGNOSIS — M9902 Segmental and somatic dysfunction of thoracic region: Secondary | ICD-10-CM | POA: Diagnosis not present

## 2021-03-09 DIAGNOSIS — M62830 Muscle spasm of back: Secondary | ICD-10-CM | POA: Diagnosis not present

## 2021-03-09 DIAGNOSIS — M9901 Segmental and somatic dysfunction of cervical region: Secondary | ICD-10-CM | POA: Diagnosis not present

## 2021-03-09 DIAGNOSIS — M9903 Segmental and somatic dysfunction of lumbar region: Secondary | ICD-10-CM | POA: Diagnosis not present

## 2021-03-09 DIAGNOSIS — M9902 Segmental and somatic dysfunction of thoracic region: Secondary | ICD-10-CM | POA: Diagnosis not present

## 2021-03-13 DIAGNOSIS — M545 Low back pain, unspecified: Secondary | ICD-10-CM | POA: Diagnosis not present

## 2021-03-13 DIAGNOSIS — M9901 Segmental and somatic dysfunction of cervical region: Secondary | ICD-10-CM | POA: Diagnosis not present

## 2021-03-14 DIAGNOSIS — R2232 Localized swelling, mass and lump, left upper limb: Secondary | ICD-10-CM | POA: Diagnosis not present

## 2021-03-19 ENCOUNTER — Ambulatory Visit: Payer: Medicare Other | Admitting: Internal Medicine

## 2021-03-20 ENCOUNTER — Encounter: Payer: Self-pay | Admitting: Adult Health

## 2021-03-20 ENCOUNTER — Other Ambulatory Visit: Payer: Self-pay

## 2021-03-20 ENCOUNTER — Ambulatory Visit (INDEPENDENT_AMBULATORY_CARE_PROVIDER_SITE_OTHER): Payer: Medicare Other | Admitting: Adult Health

## 2021-03-20 VITALS — BP 122/68 | HR 75 | Temp 97.3°F | Ht 66.0 in | Wt 184.4 lb

## 2021-03-20 DIAGNOSIS — N183 Chronic kidney disease, stage 3 unspecified: Secondary | ICD-10-CM | POA: Diagnosis not present

## 2021-03-20 DIAGNOSIS — E785 Hyperlipidemia, unspecified: Secondary | ICD-10-CM

## 2021-03-20 DIAGNOSIS — J302 Other seasonal allergic rhinitis: Secondary | ICD-10-CM | POA: Diagnosis not present

## 2021-03-20 DIAGNOSIS — Z23 Encounter for immunization: Secondary | ICD-10-CM | POA: Diagnosis not present

## 2021-03-20 DIAGNOSIS — J4550 Severe persistent asthma, uncomplicated: Secondary | ICD-10-CM

## 2021-03-20 DIAGNOSIS — J3089 Other allergic rhinitis: Secondary | ICD-10-CM | POA: Diagnosis not present

## 2021-03-20 DIAGNOSIS — I10 Essential (primary) hypertension: Secondary | ICD-10-CM | POA: Diagnosis not present

## 2021-03-20 LAB — COMPREHENSIVE METABOLIC PANEL
ALT: 16 U/L (ref 0–53)
AST: 20 U/L (ref 0–37)
Albumin: 4.1 g/dL (ref 3.5–5.2)
Alkaline Phosphatase: 60 U/L (ref 39–117)
BUN: 11 mg/dL (ref 6–23)
CO2: 30 mEq/L (ref 19–32)
Calcium: 9.8 mg/dL (ref 8.4–10.5)
Chloride: 107 mEq/L (ref 96–112)
Creatinine, Ser: 1.27 mg/dL (ref 0.40–1.50)
GFR: 53.22 mL/min — ABNORMAL LOW (ref >60.00)
Glucose, Bld: 89 mg/dL (ref 70–99)
Potassium: 4.1 mEq/L (ref 3.5–5.1)
Sodium: 144 mEq/L (ref 135–145)
Total Bilirubin: 0.6 mg/dL (ref 0.2–1.2)
Total Protein: 7 g/dL (ref 6.0–8.3)

## 2021-03-20 LAB — CBC WITH DIFFERENTIAL/PLATELET
Basophils Absolute: 0.1 10*3/uL (ref 0.0–0.1)
Basophils Relative: 0.7 % (ref 0.0–3.0)
Eosinophils Absolute: 0.7 10*3/uL (ref 0.0–0.7)
Eosinophils Relative: 7.8 % — ABNORMAL HIGH (ref 0.0–5.0)
HCT: 43.8 % (ref 39.0–52.0)
Hemoglobin: 14.5 g/dL (ref 13.0–17.0)
Lymphocytes Relative: 36 % (ref 12.0–46.0)
Lymphs Abs: 3 10*3/uL (ref 0.7–4.0)
MCHC: 33.2 g/dL (ref 30.0–36.0)
MCV: 90 fl (ref 78.0–100.0)
Monocytes Absolute: 0.6 10*3/uL (ref 0.1–1.0)
Monocytes Relative: 7.2 % (ref 3.0–12.0)
Neutro Abs: 4.1 10*3/uL (ref 1.4–7.7)
Neutrophils Relative %: 48.3 % (ref 43.0–77.0)
Platelets: 303 10*3/uL (ref 150.0–400.0)
RBC: 4.87 Mil/uL (ref 4.22–5.81)
RDW: 13.6 % (ref 11.5–15.5)
WBC: 8.4 10*3/uL (ref 4.0–10.5)

## 2021-03-20 MED ORDER — BUDESONIDE-FORMOTEROL FUMARATE 160-4.5 MCG/ACT IN AERO: INHALATION_SPRAY | RESPIRATORY_TRACT | 5 refills | Status: DC

## 2021-03-20 NOTE — Assessment & Plan Note (Signed)
Patient education on a healthy low-fat and cholesterol diet. Managed only with diet.  Patient declines statin therapy.

## 2021-03-20 NOTE — Assessment & Plan Note (Addendum)
Currently well controlled on diet only.  Patient to continue low-salt diet. Labs today

## 2021-03-20 NOTE — Patient Instructions (Addendum)
Continue on Symbicort 2 puffs Twice daily  , rinse after use.   Continue on current regimen .  Low fat cholesterol diet.  Low salt diet.  Labs today  Pneumovax vaccine today .  Follow up Dr. Melvyn Novas in 4 months and As needed

## 2021-03-20 NOTE — Assessment & Plan Note (Signed)
Continue on trigger prevention continue on current regimen appears to be under good control.

## 2021-03-20 NOTE — Progress Notes (Signed)
@Patient  ID: [name], male    DOB: 1941-05-03, 80 y.o.   MRN: [id number]  Chief Complaint  Patient presents with  . Follow-up    Referring provider: Tanda Rockers, MD  HPI: 80 year old male never smoker followed for severe chronic asthma.  Patient is a primary care patient of Dr. Marlene Lard followed for hypertension and hyperlipidemia, chronic kidney dz   Hx -patient is a never smoker.  Had a twin sister that died from a brain tumor.  Divorced with kids in [place].  Sings gospel music.  TEST/EVENTS :  chronic severe asthma with best FEV1 2.2 L documented 01/2001 and documented non-adherence Mainly lives off samples of inhalers and nose sprays - Off allergy vaccines x7/2010 >highly allergic skin tests per Dr Annamaria Boots >restart vaccine November 06, 2009>stop 07/30/13 to watch - Spirometry on "good day" 01/14/12 = FEV1 1.54 Ratio 0.58  - 2/24/2017added prn prednisone when needing neb - FENO 04/23/16- 32  -CBC with differential 04/23/2016-eosinophils elevated 7.090%  - Spirometry 2/6/2018FEV1 1.23 (49%) Ratio 60  -Colon 2016 - no further colons needed.  -PVX (2010) , Prevnar (2015) , TDAP 2015) -declines flu shot , covid vaccine   03/20/2021 Follow up ; Asthma, HTN and Hyperlipidemia  Patient returns for a 80-month follow-up.  Patient says overall asthma has been doing well.  He has had no flare of his cough or wheezing.  He remains on Symbicort twice daily.  Says more affordable through insurance now , cost ~$40 /month . Denies any increased albuterol use. We discussed getting a Pneumovax today. Patient says he tries to stay active he does get some shortness of breath with heavy activity.  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  He does have some mild hypertension and hyperlipidemia.  Is managed with diet only. Blood pressure today is 122/68.  Patient education on a low-salt and healthy diet discussed.Marland KitchenHistory of chronic kidney disease , labs in 2020 showed improved Scr  /GFR at 1.36/61% respectfully .   Weight remains stable , good appetite .   Patient declines flu and COVID-19 vaccines..   No Known Allergies  Immunization History  Administered Date(s) Administered  . Pneumococcal Conjugate-13 07/25/2014  . Pneumococcal Polysaccharide-23 12/23/2008  . Tdap 07/25/2014    Past Medical History:  Diagnosis Date  . Acid reflux   . Asthma 06/18/2011  . Borderline hypertension   . Cataract   . Colon polyps 2011   1 Hyperplastic and 1 Tubular Adenoma   . Dysphagia   . Esophageal stricture   . GERD (gastroesophageal reflux disease)   . Hx of adenomatous colonic polyps 06/25/2015  . Personal history of colonic polyps 12/1989  . Shingles   . Shoulder pain, right   . Unspecified asthma(493.90)   . Urinary frequency     Tobacco History: Social History   Tobacco Use  Smoking Status Never Smoker  Smokeless Tobacco Never Used   Counseling given: Not Answered   Outpatient Medications Prior to Visit  Medication Sig Dispense Refill  . albuterol (PROAIR HFA) 108 (90 Base) MCG/ACT inhaler 2 puffs every 4 hours as needed for wheezing ((PLANB)) 8 g 5  . albuterol (PROVENTIL) (2.5 MG/3ML) 0.083% nebulizer solution USE 1 VIAL IN NEBULIZER EVERY 4 HOURS AS NEEDED 75 mL 5  . Ascorbic Acid (VITAMIN C) 1000 MG tablet Take 1,000 mg by mouth daily.    Marland Kitchen aspirin 81 MG EC tablet Take 81 mg by mouth every morning.    . CHELATED MAGNESIUM PO  Take 1 tablet by mouth daily.    . cholecalciferol (VITAMIN D3) 25 MCG (1000 UT) tablet Take 1,000 Units by mouth daily.    . cyanocobalamin 1000 MCG tablet Take 1,000 mcg by mouth daily.    . famotidine (PEPCID) 20 MG tablet Take 20 mg by mouth daily.    Marland Kitchen guaifenesin (HUMIBID E) 400 MG TABS tablet Take 400 mg by mouth every 4 (four) hours as needed.    . Ibuprofen 200 MG CAPS Take 200 mg by mouth every 6 (six) hours as needed (headache/pain).     . meclizine (ANTIVERT) 12.5 MG tablet TAKE 1 TABLET BY MOUTH 4 TIMES A DAY AS  NEEDED FOR DIZZINESS 40 tablet 0  . oxymetazoline (AFRIN) 0.05 % nasal spray Place 1 spray into both nostrils 2 (two) times daily as needed for congestion.    . Polyvinyl Alcohol-Povidone (REFRESH OP) Apply 1 drop to eye as needed (dry eyes).     . predniSONE (DELTASONE) 10 MG tablet 2 daily until better, then 1 x 3 days and stop 100 tablet 5  . Respiratory Therapy Supplies (FLUTTER) DEVI Use every 4 hours as needed for cough and congestion    . budesonide-formoterol (SYMBICORT) 160-4.5 MCG/ACT inhaler Take 2 puffs first thing in am and then another 2 puffs about 12 hours later. 1 Inhaler 5  . montelukast (SINGULAIR) 10 MG tablet Take 1 tablet (10 mg total) by mouth at bedtime. (Patient not taking: Reported on 03/20/2021) 30 tablet 6   No facility-administered medications prior to visit.     Review of Systems:   Constitutional:   No  weight loss, night sweats,  Fevers, chills, fatigue, or  lassitude.  HEENT:   No headaches,  Difficulty swallowing,  Tooth/dental problems, or  Sore throat,                No sneezing, itching, ear ache, nasal congestion, post nasal drip,   CV:  No chest pain,  Orthopnea, PND, swelling in lower extremities, anasarca, dizziness, palpitations, syncope.   GI  No heartburn, indigestion, abdominal pain, nausea, vomiting, diarrhea, change in bowel habits, loss of appetite, bloody stools.   Resp:   No excess mucus, no productive cough,  No non-productive cough,  No coughing up of blood.  No change in color of mucus.  No wheezing.  No chest wall deformity  Skin: no rash or lesions.  GU: no dysuria, change in color of urine, no urgency or frequency.  No flank pain, no hematuria   MS:  No joint pain or swelling.  No decreased range of motion.  No back pain.    Physical Exam  BP 122/68 (BP Location: Left Arm, Cuff Size: Normal)   Pulse 75   Temp (!) 97.3 F (36.3 C) (Temporal)   Ht 5\' 6"  (1.676 m)   Wt 184 lb 6.4 oz (83.6 kg)   SpO2 97%   BMI 29.76 kg/m    GEN: A/Ox3; pleasant , NAD, well nourished    HEENT:  [place]/AT,    NOSE-clear, THROAT-clear, no lesions, no postnasal drip or exudate noted. Poor dentition   NECK:  Supple w/ fair ROM; no JVD; normal carotid impulses w/o bruits; no thyromegaly or nodules palpated; no lymphadenopathy.    RESP  Clear  P & A; w/o, wheezes/ rales/ or rhonchi. no accessory muscle use, no dullness to percussion  CARD:  RRR, no m/r/g, no peripheral edema, pulses intact, no cyanosis or clubbing.  GI:   Soft & nt;  nml bowel sounds; no organomegaly or masses detected.   Musco: Warm bil, no deformities or joint swelling noted.   Neuro: alert, no focal deficits noted.    Skin: Warm, no lesions or rashes    Lab Results:  CBC    Component Value Date/Time   WBC 7.9 07/21/2019 1132   RBC 5.00 07/21/2019 1132   HGB 14.8 07/21/2019 1132   HCT 45.3 07/21/2019 1132   PLT 327.0 07/21/2019 1132   MCV 90.7 07/21/2019 1132   MCH 29.7 03/23/2019 0621   MCHC 32.6 07/21/2019 1132   RDW 13.8 07/21/2019 1132   LYMPHSABS 2.8 07/21/2019 1132   MONOABS 0.6 07/21/2019 1132   EOSABS 0.5 07/21/2019 1132   BASOSABS 0.1 07/21/2019 1132    BMET    Component Value Date/Time   NA 143 07/21/2019 1132   K 4.0 07/21/2019 1132   CL 108 07/21/2019 1132   CO2 27 07/21/2019 1132   GLUCOSE 95 07/21/2019 1132   BUN 11 07/21/2019 1132   CREATININE 1.36 07/21/2019 1132   CALCIUM 9.6 07/21/2019 1132   GFRNONAA 51 (L) 03/23/2019 0621   GFRAA 59 (L) 03/23/2019 0621    BNP No results found for: BNP  ProBNP No results found for: PROBNP  Imaging: No results found.    No flowsheet data found.  Lab Results  Component Value Date   NITRICOXIDE 32 04/23/2016        Assessment & Plan:   Severe persistent asthma Appears to be under control-continue current regimen Pneumovax today Patient declines Covid 19 vaccine and flu vaccine.  Patient education given  Plan  Patient Instructions  Continue on Symbicort 2  puffs Twice daily  , rinse after use.   Continue on current regimen .  Low fat cholesterol diet.  Low salt diet.  Labs today  Pneumovax vaccine today .  Follow up Dr. Melvyn Novas in 4 months and As needed       Seasonal and perennial allergic rhinitis Continue on trigger prevention continue on current regimen appears to be under good control.  Hyperlipidemia Patient education on a healthy low-fat and cholesterol diet. Managed only with diet.  Patient declines statin therapy.  Chronic kidney disease, stage III (moderate) Labs today.  Patient is aware to avoid nonsteroidals  Essential hypertension, benign Currently well controlled on diet only.  Patient to continue low-salt diet. Labs today     Rexene Edison, NP 03/20/2021

## 2021-03-20 NOTE — Addendum Note (Signed)
Addended by: Geanie Logan on: 03/20/2021 12:35 PM   Modules accepted: Orders

## 2021-03-20 NOTE — Assessment & Plan Note (Addendum)
Appears to be under control-continue current regimen Pneumovax today Patient declines Covid 19 vaccine and flu vaccine.  Patient education given  Plan  Patient Instructions  Continue on Symbicort 2 puffs Twice daily  , rinse after use.   Continue on current regimen .  Low fat cholesterol diet.  Low salt diet.  Labs today  Pneumovax vaccine today .  Follow up Dr. Melvyn Novas in 4 months and As needed

## 2021-03-20 NOTE — Assessment & Plan Note (Signed)
Labs today.  Patient is aware to avoid nonsteroidals

## 2021-03-21 NOTE — Progress Notes (Signed)
ATC x 1.  Left detailed message (per DPR) to return call regarding lab work.

## 2021-03-23 NOTE — Progress Notes (Signed)
ATC x2, no answer, left VM to return call.

## 2021-03-27 ENCOUNTER — Encounter: Payer: Self-pay | Admitting: *Deleted

## 2021-03-27 NOTE — Progress Notes (Signed)
No return call from patient after 2nd attempt.  Unable to reach letter sent to patient.

## 2021-04-04 DIAGNOSIS — M9903 Segmental and somatic dysfunction of lumbar region: Secondary | ICD-10-CM | POA: Diagnosis not present

## 2021-04-04 DIAGNOSIS — M9902 Segmental and somatic dysfunction of thoracic region: Secondary | ICD-10-CM | POA: Diagnosis not present

## 2021-04-04 DIAGNOSIS — M9901 Segmental and somatic dysfunction of cervical region: Secondary | ICD-10-CM | POA: Diagnosis not present

## 2021-04-04 DIAGNOSIS — M9904 Segmental and somatic dysfunction of sacral region: Secondary | ICD-10-CM | POA: Diagnosis not present

## 2021-04-16 DIAGNOSIS — I10 Essential (primary) hypertension: Secondary | ICD-10-CM | POA: Diagnosis not present

## 2021-04-16 DIAGNOSIS — R55 Syncope and collapse: Secondary | ICD-10-CM | POA: Diagnosis not present

## 2021-04-16 DIAGNOSIS — R531 Weakness: Secondary | ICD-10-CM | POA: Diagnosis not present

## 2021-04-16 DIAGNOSIS — R29810 Facial weakness: Secondary | ICD-10-CM | POA: Diagnosis not present

## 2021-04-17 DIAGNOSIS — M545 Low back pain, unspecified: Secondary | ICD-10-CM | POA: Diagnosis not present

## 2021-04-17 DIAGNOSIS — M9901 Segmental and somatic dysfunction of cervical region: Secondary | ICD-10-CM | POA: Diagnosis not present

## 2021-04-20 DIAGNOSIS — M9901 Segmental and somatic dysfunction of cervical region: Secondary | ICD-10-CM | POA: Diagnosis not present

## 2021-04-20 DIAGNOSIS — M545 Low back pain, unspecified: Secondary | ICD-10-CM | POA: Diagnosis not present

## 2021-04-23 ENCOUNTER — Other Ambulatory Visit: Payer: Self-pay | Admitting: Internal Medicine

## 2021-05-11 DIAGNOSIS — M9902 Segmental and somatic dysfunction of thoracic region: Secondary | ICD-10-CM | POA: Diagnosis not present

## 2021-05-11 DIAGNOSIS — M9901 Segmental and somatic dysfunction of cervical region: Secondary | ICD-10-CM | POA: Diagnosis not present

## 2021-05-11 DIAGNOSIS — M5382 Other specified dorsopathies, cervical region: Secondary | ICD-10-CM | POA: Diagnosis not present

## 2021-05-11 DIAGNOSIS — M9903 Segmental and somatic dysfunction of lumbar region: Secondary | ICD-10-CM | POA: Diagnosis not present

## 2021-05-15 ENCOUNTER — Ambulatory Visit: Payer: Medicare Other | Admitting: Internal Medicine

## 2021-05-18 DIAGNOSIS — M9903 Segmental and somatic dysfunction of lumbar region: Secondary | ICD-10-CM | POA: Diagnosis not present

## 2021-05-18 DIAGNOSIS — M9902 Segmental and somatic dysfunction of thoracic region: Secondary | ICD-10-CM | POA: Diagnosis not present

## 2021-05-18 DIAGNOSIS — M5382 Other specified dorsopathies, cervical region: Secondary | ICD-10-CM | POA: Diagnosis not present

## 2021-05-18 DIAGNOSIS — M9901 Segmental and somatic dysfunction of cervical region: Secondary | ICD-10-CM | POA: Diagnosis not present

## 2021-05-22 DIAGNOSIS — M9903 Segmental and somatic dysfunction of lumbar region: Secondary | ICD-10-CM | POA: Diagnosis not present

## 2021-05-22 DIAGNOSIS — M5382 Other specified dorsopathies, cervical region: Secondary | ICD-10-CM | POA: Diagnosis not present

## 2021-05-22 DIAGNOSIS — M9902 Segmental and somatic dysfunction of thoracic region: Secondary | ICD-10-CM | POA: Diagnosis not present

## 2021-05-22 DIAGNOSIS — M9901 Segmental and somatic dysfunction of cervical region: Secondary | ICD-10-CM | POA: Diagnosis not present

## 2021-05-25 DIAGNOSIS — M9901 Segmental and somatic dysfunction of cervical region: Secondary | ICD-10-CM | POA: Diagnosis not present

## 2021-06-04 DIAGNOSIS — M9903 Segmental and somatic dysfunction of lumbar region: Secondary | ICD-10-CM | POA: Diagnosis not present

## 2021-06-04 DIAGNOSIS — M9902 Segmental and somatic dysfunction of thoracic region: Secondary | ICD-10-CM | POA: Diagnosis not present

## 2021-06-04 DIAGNOSIS — M9901 Segmental and somatic dysfunction of cervical region: Secondary | ICD-10-CM | POA: Diagnosis not present

## 2021-06-08 DIAGNOSIS — M9902 Segmental and somatic dysfunction of thoracic region: Secondary | ICD-10-CM | POA: Diagnosis not present

## 2021-06-08 DIAGNOSIS — M9901 Segmental and somatic dysfunction of cervical region: Secondary | ICD-10-CM | POA: Diagnosis not present

## 2021-06-08 DIAGNOSIS — M9903 Segmental and somatic dysfunction of lumbar region: Secondary | ICD-10-CM | POA: Diagnosis not present

## 2021-06-17 ENCOUNTER — Other Ambulatory Visit: Payer: Self-pay | Admitting: Internal Medicine

## 2021-06-24 DIAGNOSIS — Z20822 Contact with and (suspected) exposure to covid-19: Secondary | ICD-10-CM | POA: Diagnosis not present

## 2021-07-16 DIAGNOSIS — M9901 Segmental and somatic dysfunction of cervical region: Secondary | ICD-10-CM | POA: Diagnosis not present

## 2021-07-16 DIAGNOSIS — M9902 Segmental and somatic dysfunction of thoracic region: Secondary | ICD-10-CM | POA: Diagnosis not present

## 2021-07-16 DIAGNOSIS — M62830 Muscle spasm of back: Secondary | ICD-10-CM | POA: Diagnosis not present

## 2021-07-16 DIAGNOSIS — M9903 Segmental and somatic dysfunction of lumbar region: Secondary | ICD-10-CM | POA: Diagnosis not present

## 2021-07-20 ENCOUNTER — Other Ambulatory Visit: Payer: Self-pay

## 2021-07-20 ENCOUNTER — Encounter: Payer: Self-pay | Admitting: Internal Medicine

## 2021-07-20 ENCOUNTER — Ambulatory Visit (INDEPENDENT_AMBULATORY_CARE_PROVIDER_SITE_OTHER): Payer: Medicare Other | Admitting: Internal Medicine

## 2021-07-20 DIAGNOSIS — I10 Essential (primary) hypertension: Secondary | ICD-10-CM | POA: Diagnosis not present

## 2021-07-20 DIAGNOSIS — J4550 Severe persistent asthma, uncomplicated: Secondary | ICD-10-CM | POA: Diagnosis not present

## 2021-07-20 MED ORDER — PREDNISONE 10 MG PO TABS
ORAL_TABLET | ORAL | 1 refills | Status: DC
Start: 2021-07-20 — End: 2021-11-19

## 2021-07-20 MED ORDER — ALBUTEROL SULFATE (2.5 MG/3ML) 0.083% IN NEBU: INHALATION_SOLUTION | RESPIRATORY_TRACT | 5 refills | Status: DC

## 2021-07-20 NOTE — Progress Notes (Signed)
Subjective:   Patient ID: [name]    DOB: December 21, 1941     Brief patient profile: 80 yobm denies ever smoking but has lifelong asthma with severe chronic pattern and best FEV1 of around 2.2 liters documented 2002 and also documented non-adherence  followed in pulmonary for primary care also for hbp and hyperlipidemia.   History of Present Illness    07/30/13 Allergy eval rx completed x 4 years > d/c'd and f/u allergy prn    04/03/2015 Follow up : chronic asthma/ HTN/non adherence / relies on samples/ no formulary / has med calendar  Pt returns for 1 month follow up .  Doing well with no asthma flare , on Dulera . Marland Kitchen  Need to establish with dentist > multiple teeth extracted   05/06/2016 NP Follow up ; Severe chronic asthma  Patient presents for 1 month follow up and med review We reviewed all his medications organize them into a medication count with patient education Appears he is taking medications correctly. However, does depend on samples. He did not have his Pepcid or Protonix today but says he has them at home. Says overall his breathing is doing well without any flare cough or wheezing. Seen by allergy Dr. Annamaria Boots on 04/23/16   , IgE 203, elevated eos Looking into Nucala.  Prevnar and PVX utd.  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 Is able to sing without sob.  rec No change rx   06/04/16 Dr Annamaria Boots rec Nucala > could not afford deductible       03/26/2019  f/u ov/[name] re:  Chronic asthma/ new BPV better with meclizine / maint on symbicort 160 2bid but sample dep/ Prednisone is plan D but not using  Chief Complaint  Patient presents with   Hospitalization Follow-up    Dizziness. Patient stated he has not been having any issues with his breathing.  Dyspnea:  Not limited by breathing from desired activities  /very sedentary / nl pace flat surfaces/ shopping ok  Cough: none Sleeping: bed flat  1-2 pillows  SABA use: neb once daily due to allergies but has not  started prednisone yet  02: none  rec Plan A = Automatic = symbicort 160 Take 2 puffs first thing in am and then another 2 puffs about 12 hours later.  Work on Education officer, community B = Backup Plan C = Crisis - only use your albuterol nebulizer if you first try Plan B and it fails to help Plan D = Deltasone  If doing ABC and not improving then prednisone 10 mg x 2 until better then 1 daily x 3 days and stop Plan E = ER - go to ER or call 911 if all else fails     04/19/2019  f/u ov/[name] re: chronic asthma  Chief Complaint  Patient presents with   Follow-up    Breathing is doing well today. He has not been using his albuterol inhaler or neb.  Dyspnea:  MMRC2 = can't walk a nl pace on a flat grade s sob but does fine slow and flat e Cough: clearing thorat a lot  Sleeping: ok flat/ one pillow SABA use: only has neb, hasn't used in a while  02: none Does not recall last time he needed pred as plan C rec Plan A = Automatic = symbicort 160 Take 2 puffs first thing in am and then another 2 puffs about 12 hours later.  Work on inhaler technique:  Plan B = albuterol inhaler but you don't have one so skip this step Plan C = Crisis - only use your albuterol nebulizer -ok to use the nebulizer up to every 4 hours but if start needing it regularly call for immediate appointment Plan D = Deltasone If need to use the nebulizer more than you do now then take prednsione 10 m x 2 each am until better then 1 daily  Plan E = ER - go to ER or call 911 if all else fails   Please schedule a follow up office visit in 4 weeks, sooner if needed  with all medications /inhalers/ solutions in hand so we can verify exactly what you are taking. This includes all medications from all doctors and over the counters - has 30 day samples of symbicort 160 with about 30 left on present rx     05/21/2019  f/u ov/[name] re: confused again re action plan / the one med he doesn't have on him his his rescue saba  Chief  Complaint  Patient presents with   Follow-up    Breathing is overall doinwell well. He is using his ventolin about 2 x per day and has not used his neb lately. Needs refills on pepcid and meclizine.   Dyspnea:  MMRC2 = can't walk a nl pace on a flat grade s sob but does fine slow and flat  Cough: none Sleeping: lie flat ok  SABA use: twice daily whether he needs it or not  02: none  rec Plan A = Automatic = symbicort 160 Take 2 puffs first thing in am and then another 2 puffs about 12 hours later.  Work on Orthoptist B = Backup Only use your albuterol inhaler as a rescue medication Plan C = Crisis - only use your albuterol nebulizer if you first try Plan B and it fails to help > ok to use the nebulizer up to every 4 hours but if start needing it regularly call for immediate appointment Plan D = Deltasone If need to use the nebulizer more than you do now then take prednsione 10 m x 2 each am until better then 1 daily  X 5 day and stop  Please schedule a follow up office visit in 4 weeks, sooner if needed  with all medications /inhalers/ solutions in hand so we can verify exactly what you are taking. This includes all medications from all doctors and over the counters   08/08/2020  f/u ov/[name] re:  Chronic severe asthma/  No vaccination Roney Jaffe has not needed prednisone since  Last ov Chief Complaint  Patient presents with   Follow-up    pt is for asthma no complaints  Dyspnea:  MMRC2 = can't walk a nl pace on a flat grade s sob but does fine slow and flat   Cough: no Sleeping: able to lie flat  SABA use: rare proair 02: none  rec  I very strongly recommend you get the moderna or pfizer vaccine as soon as possible   See calendar for specific medication instructions    11/15/2020  f/u ov/[name] re: chronic asthma / not vaccinated for covid or flu Chief Complaint  Patient presents with   Follow-up    voiced no complaints  Dyspnea:  MMRC2 = can't walk a nl pace on a flat  grade s sob but does fine slow and flat  Cough: no  Sleeping: ok bed is flat one pillow no resp symptoms SABA use: once a  week 02: no  Rec Plan A = Automatic = Always=  Symbicort 160 Take 2 puffs first thing in am and then another 2 puffs about 12 hours later.  Plan B = Backup (to supplement plan A, not to replace it) Only use your albuterol inhaler as a rescue medication Plan C = Crisis (instead of Plan B but only if Plan B stops working) - only use your albuterol nebulizer if you first try Plan B and it fails to help > ok to use the nebulizer up to every 4 hours but if start needing it regularly call for immediate appointment  I very strongly recommend you get the moderna or pfizer vaccine   Please schedule a follow up visit in 6 months but call sooner if needed    07/20/2021  f/u ov/[name] re: severe chronic asthma with remodeling on symbicort 160  Chief Complaint  Patient presents with   Follow-up    Patient reports asthma is controlled.    Dyspnea:  walks 5-10 min some hills s stopping / hs some fleeting chest discomforts never related to exertion  Cough: no Sleeping: no resp complaints SABA use: rarely 02: no Covid status:   never vax   No obvious day to day or daytime variability or assoc excess/ purulent sputum or mucus plugs or hemoptysis or cp or chest tightness, subjective wheeze or overt sinus or hb symptoms.   Sleeping  without nocturnal  or early am exacerbation  of respiratory  c/o's or need for noct saba. Also denies any obvious fluctuation of symptoms with weather or environmental changes or other aggravating or alleviating factors except as outlined above   No unusual exposure hx or h/o childhood pna/ asthma or knowledge of premature birth.  Current Allergies, Complete Past Medical History, Past Surgical History, Family History, and Social History were reviewed in [employer] record.  ROS  The following are not active complaints unless  bolded Hoarseness, sore throat, dysphagia, dental problems, itching, sneezing,  nasal congestion or discharge of excess mucus or purulent secretions, ear ache,   fever, chills, sweats, unintended wt loss or wt gain, classically pleuritic or exertional cp,  orthopnea pnd or arm/hand swelling  or leg swelling, presyncope, palpitations, abdominal pain, anorexia, nausea, vomiting, diarrhea  or change in bowel habits or change in bladder habits, change in stools or change in urine, dysuria, hematuria,  rash, arthralgias, visual complaints, headache, numbness, weakness or ataxia or problems with walking or coordination,  change in mood or  memory.        Current Meds  Medication Sig   albuterol (PROAIR HFA) 108 (90 Base) MCG/ACT inhaler 2 puffs every 4 hours as needed for wheezing ((PLANB))   albuterol (PROVENTIL) (2.5 MG/3ML) 0.083% nebulizer solution USE 1 VIAL IN NEBULIZER EVERY 4 HOURS AS NEEDED   Ascorbic Acid (VITAMIN C) 1000 MG tablet Take 1,000 mg by mouth daily.   aspirin 81 MG EC tablet Take 81 mg by mouth every morning.   budesonide-formoterol (SYMBICORT) 160-4.5 MCG/ACT inhaler Take 2 puffs first thing in am and then another 2 puffs about 12 hours later.   CHELATED MAGNESIUM PO Take 1 tablet by mouth daily.   cholecalciferol (VITAMIN D3) 25 MCG (1000 UT) tablet Take 1,000 Units by mouth daily.   cyanocobalamin 1000 MCG tablet Take 1,000 mcg by mouth daily.   famotidine (PEPCID) 20 MG tablet Take 20 mg by mouth daily.   guaifenesin (HUMIBID E) 400 MG TABS tablet Take 400 mg by  mouth every 4 (four) hours as needed.   Ibuprofen 200 MG CAPS Take 200 mg by mouth every 6 (six) hours as needed (headache/pain).    meclizine (ANTIVERT) 12.5 MG tablet TAKE 1 TABLET BY MOUTH 4 TIMES A DAY AS NEEDED FOR DIZZINESS   montelukast (SINGULAIR) 10 MG tablet Take 1 tablet (10 mg total) by mouth at bedtime.   oxymetazoline (AFRIN) 0.05 % nasal spray Place 1 spray into both nostrils 2 (two) times daily as needed  for congestion.   Polyvinyl Alcohol-Povidone (REFRESH OP) Apply 1 drop to eye as needed (dry eyes).    predniSONE (DELTASONE) 10 MG tablet 2 daily until better, then 1 x 3 days and stop   Respiratory Therapy Supplies (FLUTTER) DEVI Use every 4 hours as needed for cough and congestion                       Past Medical Hx ASTHMA (ICD-493.90)  --chronic severe asthma with best FEV1 2.2 L documented 01/2001 and  documented non-adherence Mainly lives off samples of inhalers and nose sprays  - HFA 75% June 15, 2009 > 90% August 02, 2009 > 100% December 27, 2009  - Off allergy vaccines x7/2010 > highly allergic skin tests per Dr Annamaria Boots > restarted vaccine November 06, 2009 > stopped  07/2013  SHOULDER PAIN, RIGHT (ICD-719.41)   HYPERTENSION, BORDERLINE (ICD-401.9)  Shingles T 8 Left 08/2000 HEALTH MAINTENANCE...............................................................................................Marland KitchenWert  - Colonoscopy 12/1989,  2011  - dT 07/25/2014  - Pneumovax 02/2003 and 05/2009 - Prevnar 07/25/14 -Flu declined October 17, 2008 and September 21, 2009 , declined flu shot 2015, 2017  -CPX 07/21/2019            Objective:  Physical Exam     07/20/2021     183  11/15/2020  184  08/08/2020   187 01/25/2020      193  09/29/2019    194  05/23/2014  201> 201 07/04/2014 > 07/25/14  196>198 08/22/2014 >194 09/19/2014 >198 10/11/2014 > 10/24/2014  196>   01/02/2015 193 > 03/06/2015  198 >198 04/03/2015 > 05/16/2015 196 >  06/02/2015 196 >197 06/16/2015 > 12/12/2015 199> 197 01/08/2016 >   02/16/2016     194 > 04/08/2016  193     Vital signs reviewed  07/20/2021  - Note at rest 02 sats  93% on RA   General appearance:    amb stoic bm nad     HEENT : pt wearing mask not removed for exam due to covid -19 concerns.    NECK :  without JVD/Nodes/TM/ nl carotid upstrokes bilaterally   LUNGS: no acc muscle use,  Mod barrel  contour chest wall with bilateral  Distant bs s audible wheeze and  without cough on insp  or exp maneuvers and mod  Hyperresonant  to  percussion bilaterally     CV:  RRR  no s3 or murmur or increase in P2, and no edema   ABD:  soft and nontender with pos mid insp Hoover's  in the supine position. No bruits or organomegaly appreciated, bowel sounds nl  MS:     ext warm without deformities, calf tenderness, cyanosis or clubbing No obvious joint restrictions   SKIN: warm and dry without lesions    NEURO:  alert, approp, nl sensorium with  no motor or cerebellar deficits apparent.

## 2021-07-20 NOTE — Patient Instructions (Addendum)
We will refer you to internal medicine out your way.  No change in your medications    Please schedule a follow up visit in 6 months but call sooner if needed  with all medications /inhalers/ solutions in hand so we can verify exactly what you are taking. This includes all medications from all doctors and over the counters

## 2021-07-20 NOTE — Assessment & Plan Note (Signed)
Onset in childhood  --chronic severe asthma with best FEV1 2.2 L documented 01/2001 and documented non-adherence Mainly lives off samples of inhalers and nose sprays  - Off allergy vaccines x7/2010 > highly allergic skin tests per Dr Annamaria Boots > restart vaccine November 06, 2009>stop 07/30/13 to watch - Spirometry on "good day" 01/14/12 =  FEV1 1.54  Ratio 0.58  - 02/16/2016 added prn prednisone when needing neb - FENO 04/23/16- 32  -CBC with differential 04/23/2016-eosinophils elevated 7%  - Spirometry 01/28/2017  FEV1 1.23 (49%)  Ratio 60    - Spirometry 08/11/2018  FEV1 1.43 (60%)  Ratio 60  - FENO 08/11/2018  =   30 on symb 160 2bid  - 07/20/2021  After extensive coaching inhaler device,  effectiveness =    75%   At this point All goals of chronic asthma control met including optimal (though not nl due to remodeling) lung function and elimination of symptoms with reasnoable need for rescue therapy and says no recent need for prednisone as above either  Contingencies discussed in full including contacting this office immediately if not controlling the symptoms using the rule of two's.

## 2021-07-20 NOTE — Assessment & Plan Note (Signed)
Onset 2012/ rx is low salt diet B/p elevated at ov at 160/84 09/05/2011 >>rx benicar '40mg'$  1/2 daily along w/ lifesytle changes  - 10/24/2017 informed that no longer taking any meds/ "can't afford"> bp ok off rx    Lab Results  Component Value Date   CREATININE 1.27 03/20/2021   CREATININE 1.36 07/21/2019   CREATININE 1.33 (H) 03/23/2019   ok for now just on low salt diet   - Referred tp [facility]  Internal medicine 07/20/2021 >>>          Each maintenance medication was reviewed in detail including emphasizing most importantly the difference between maintenance and prns and under what circumstances the prns are to be triggered using an action plan format where appropriate.  Total time for H and P, chart review, counseling, reviewing hfa  device(s) and generating customized AVS unique to this office visit / same day charting = 26 min

## 2021-08-06 DIAGNOSIS — M9902 Segmental and somatic dysfunction of thoracic region: Secondary | ICD-10-CM | POA: Diagnosis not present

## 2021-08-06 DIAGNOSIS — M9903 Segmental and somatic dysfunction of lumbar region: Secondary | ICD-10-CM | POA: Diagnosis not present

## 2021-08-06 DIAGNOSIS — M9901 Segmental and somatic dysfunction of cervical region: Secondary | ICD-10-CM | POA: Diagnosis not present

## 2021-08-07 ENCOUNTER — Other Ambulatory Visit: Payer: Self-pay | Admitting: Internal Medicine

## 2021-08-15 ENCOUNTER — Other Ambulatory Visit: Payer: Self-pay | Admitting: Internal Medicine

## 2021-08-15 NOTE — Telephone Encounter (Signed)
Patient is requesting meclizine, would you like to refill this?

## 2021-08-17 DIAGNOSIS — M9901 Segmental and somatic dysfunction of cervical region: Secondary | ICD-10-CM | POA: Diagnosis not present

## 2021-08-17 DIAGNOSIS — M9904 Segmental and somatic dysfunction of sacral region: Secondary | ICD-10-CM | POA: Diagnosis not present

## 2021-08-17 DIAGNOSIS — M9903 Segmental and somatic dysfunction of lumbar region: Secondary | ICD-10-CM | POA: Diagnosis not present

## 2021-08-17 DIAGNOSIS — M9902 Segmental and somatic dysfunction of thoracic region: Secondary | ICD-10-CM | POA: Diagnosis not present

## 2021-08-29 DIAGNOSIS — M9903 Segmental and somatic dysfunction of lumbar region: Secondary | ICD-10-CM | POA: Diagnosis not present

## 2021-08-29 DIAGNOSIS — M9902 Segmental and somatic dysfunction of thoracic region: Secondary | ICD-10-CM | POA: Diagnosis not present

## 2021-08-29 DIAGNOSIS — M9901 Segmental and somatic dysfunction of cervical region: Secondary | ICD-10-CM | POA: Diagnosis not present

## 2021-08-29 DIAGNOSIS — M62830 Muscle spasm of back: Secondary | ICD-10-CM | POA: Diagnosis not present

## 2021-09-04 DIAGNOSIS — M9901 Segmental and somatic dysfunction of cervical region: Secondary | ICD-10-CM | POA: Diagnosis not present

## 2021-09-04 DIAGNOSIS — M9902 Segmental and somatic dysfunction of thoracic region: Secondary | ICD-10-CM | POA: Diagnosis not present

## 2021-09-04 DIAGNOSIS — M9903 Segmental and somatic dysfunction of lumbar region: Secondary | ICD-10-CM | POA: Diagnosis not present

## 2021-09-04 DIAGNOSIS — M62830 Muscle spasm of back: Secondary | ICD-10-CM | POA: Diagnosis not present

## 2021-09-10 DIAGNOSIS — M62830 Muscle spasm of back: Secondary | ICD-10-CM | POA: Diagnosis not present

## 2021-09-10 DIAGNOSIS — M9902 Segmental and somatic dysfunction of thoracic region: Secondary | ICD-10-CM | POA: Diagnosis not present

## 2021-09-10 DIAGNOSIS — M9903 Segmental and somatic dysfunction of lumbar region: Secondary | ICD-10-CM | POA: Diagnosis not present

## 2021-09-10 DIAGNOSIS — M9901 Segmental and somatic dysfunction of cervical region: Secondary | ICD-10-CM | POA: Diagnosis not present

## 2021-09-19 DIAGNOSIS — M62830 Muscle spasm of back: Secondary | ICD-10-CM | POA: Diagnosis not present

## 2021-09-19 DIAGNOSIS — M9901 Segmental and somatic dysfunction of cervical region: Secondary | ICD-10-CM | POA: Diagnosis not present

## 2021-09-19 DIAGNOSIS — M9903 Segmental and somatic dysfunction of lumbar region: Secondary | ICD-10-CM | POA: Diagnosis not present

## 2021-09-19 DIAGNOSIS — M9902 Segmental and somatic dysfunction of thoracic region: Secondary | ICD-10-CM | POA: Diagnosis not present

## 2021-09-27 DIAGNOSIS — M9901 Segmental and somatic dysfunction of cervical region: Secondary | ICD-10-CM | POA: Diagnosis not present

## 2021-09-27 DIAGNOSIS — M9902 Segmental and somatic dysfunction of thoracic region: Secondary | ICD-10-CM | POA: Diagnosis not present

## 2021-09-27 DIAGNOSIS — M9903 Segmental and somatic dysfunction of lumbar region: Secondary | ICD-10-CM | POA: Diagnosis not present

## 2021-09-27 DIAGNOSIS — M62830 Muscle spasm of back: Secondary | ICD-10-CM | POA: Diagnosis not present

## 2021-10-04 ENCOUNTER — Other Ambulatory Visit: Payer: Self-pay | Admitting: Internal Medicine

## 2021-10-12 DIAGNOSIS — M9903 Segmental and somatic dysfunction of lumbar region: Secondary | ICD-10-CM | POA: Diagnosis not present

## 2021-10-12 DIAGNOSIS — M9901 Segmental and somatic dysfunction of cervical region: Secondary | ICD-10-CM | POA: Diagnosis not present

## 2021-10-12 DIAGNOSIS — M9902 Segmental and somatic dysfunction of thoracic region: Secondary | ICD-10-CM | POA: Diagnosis not present

## 2021-10-12 DIAGNOSIS — M62830 Muscle spasm of back: Secondary | ICD-10-CM | POA: Diagnosis not present

## 2021-10-23 DIAGNOSIS — M9903 Segmental and somatic dysfunction of lumbar region: Secondary | ICD-10-CM | POA: Diagnosis not present

## 2021-10-23 DIAGNOSIS — M62830 Muscle spasm of back: Secondary | ICD-10-CM | POA: Diagnosis not present

## 2021-10-23 DIAGNOSIS — M9901 Segmental and somatic dysfunction of cervical region: Secondary | ICD-10-CM | POA: Diagnosis not present

## 2021-10-23 DIAGNOSIS — M9902 Segmental and somatic dysfunction of thoracic region: Secondary | ICD-10-CM | POA: Diagnosis not present

## 2021-11-06 DIAGNOSIS — M9903 Segmental and somatic dysfunction of lumbar region: Secondary | ICD-10-CM | POA: Diagnosis not present

## 2021-11-06 DIAGNOSIS — M62830 Muscle spasm of back: Secondary | ICD-10-CM | POA: Diagnosis not present

## 2021-11-06 DIAGNOSIS — M9902 Segmental and somatic dysfunction of thoracic region: Secondary | ICD-10-CM | POA: Diagnosis not present

## 2021-11-06 DIAGNOSIS — M9901 Segmental and somatic dysfunction of cervical region: Secondary | ICD-10-CM | POA: Diagnosis not present

## 2021-11-07 ENCOUNTER — Telehealth: Payer: Self-pay | Admitting: Internal Medicine

## 2021-11-07 NOTE — Telephone Encounter (Signed)
I have called and LM on VM for pt to call back.  

## 2021-11-07 NOTE — Telephone Encounter (Signed)
Tried calling the pt and there was no answer- LMTCB.  

## 2021-11-08 DIAGNOSIS — M9901 Segmental and somatic dysfunction of cervical region: Secondary | ICD-10-CM | POA: Diagnosis not present

## 2021-11-08 DIAGNOSIS — M62830 Muscle spasm of back: Secondary | ICD-10-CM | POA: Diagnosis not present

## 2021-11-08 DIAGNOSIS — M9903 Segmental and somatic dysfunction of lumbar region: Secondary | ICD-10-CM | POA: Diagnosis not present

## 2021-11-08 DIAGNOSIS — M9902 Segmental and somatic dysfunction of thoracic region: Secondary | ICD-10-CM | POA: Diagnosis not present

## 2021-11-13 DIAGNOSIS — M9901 Segmental and somatic dysfunction of cervical region: Secondary | ICD-10-CM | POA: Diagnosis not present

## 2021-11-13 DIAGNOSIS — M62830 Muscle spasm of back: Secondary | ICD-10-CM | POA: Diagnosis not present

## 2021-11-13 DIAGNOSIS — M9902 Segmental and somatic dysfunction of thoracic region: Secondary | ICD-10-CM | POA: Diagnosis not present

## 2021-11-13 DIAGNOSIS — M9903 Segmental and somatic dysfunction of lumbar region: Secondary | ICD-10-CM | POA: Diagnosis not present

## 2021-11-13 NOTE — Telephone Encounter (Signed)
Attempted to call pt to see how his cough was doing as we had not been able to reach pt. Unable to reach. Left message for pt to return call. Due to multiple attempts trying to reach pt and unable to do so, per protocol encounter will be closed.

## 2021-11-19 ENCOUNTER — Other Ambulatory Visit: Payer: Self-pay | Admitting: Internal Medicine

## 2021-11-19 NOTE — Telephone Encounter (Signed)
Please advise if you are okay refilling med. 

## 2021-11-22 DIAGNOSIS — M9903 Segmental and somatic dysfunction of lumbar region: Secondary | ICD-10-CM | POA: Diagnosis not present

## 2021-11-22 DIAGNOSIS — M62830 Muscle spasm of back: Secondary | ICD-10-CM | POA: Diagnosis not present

## 2021-11-22 DIAGNOSIS — M9902 Segmental and somatic dysfunction of thoracic region: Secondary | ICD-10-CM | POA: Diagnosis not present

## 2021-11-22 DIAGNOSIS — M9901 Segmental and somatic dysfunction of cervical region: Secondary | ICD-10-CM | POA: Diagnosis not present

## 2021-12-18 ENCOUNTER — Encounter: Payer: Self-pay | Admitting: Internal Medicine

## 2021-12-25 DIAGNOSIS — M62830 Muscle spasm of back: Secondary | ICD-10-CM | POA: Diagnosis not present

## 2021-12-25 DIAGNOSIS — M9901 Segmental and somatic dysfunction of cervical region: Secondary | ICD-10-CM | POA: Diagnosis not present

## 2021-12-25 DIAGNOSIS — M9902 Segmental and somatic dysfunction of thoracic region: Secondary | ICD-10-CM | POA: Diagnosis not present

## 2021-12-25 DIAGNOSIS — M9903 Segmental and somatic dysfunction of lumbar region: Secondary | ICD-10-CM | POA: Diagnosis not present

## 2021-12-31 DIAGNOSIS — M62830 Muscle spasm of back: Secondary | ICD-10-CM | POA: Diagnosis not present

## 2021-12-31 DIAGNOSIS — M9901 Segmental and somatic dysfunction of cervical region: Secondary | ICD-10-CM | POA: Diagnosis not present

## 2021-12-31 DIAGNOSIS — M9903 Segmental and somatic dysfunction of lumbar region: Secondary | ICD-10-CM | POA: Diagnosis not present

## 2021-12-31 DIAGNOSIS — M9902 Segmental and somatic dysfunction of thoracic region: Secondary | ICD-10-CM | POA: Diagnosis not present

## 2022-01-02 DIAGNOSIS — M9903 Segmental and somatic dysfunction of lumbar region: Secondary | ICD-10-CM | POA: Diagnosis not present

## 2022-01-02 DIAGNOSIS — M9901 Segmental and somatic dysfunction of cervical region: Secondary | ICD-10-CM | POA: Diagnosis not present

## 2022-01-02 DIAGNOSIS — M62830 Muscle spasm of back: Secondary | ICD-10-CM | POA: Diagnosis not present

## 2022-01-02 DIAGNOSIS — M9902 Segmental and somatic dysfunction of thoracic region: Secondary | ICD-10-CM | POA: Diagnosis not present

## 2022-01-13 NOTE — Progress Notes (Deleted)
Subjective:   Patient ID: [name]    DOB: [birth year]     Brief patient profile: 80 yobm denies ever smoking but has lifelong asthma with severe chronic pattern and best FEV1 of around 2.2 liters documented 2002 and also documented non-adherence  followed in pulmonary for primary care also for hbp and hyperlipidemia.   History of Present Illness    07/30/13 Allergy eval rx completed x 4 years > d/c'd and f/u allergy prn    04/03/2015 Follow up : chronic asthma/ HTN/non adherence / relies on samples/ no formulary / has med calendar  Pt returns for 1 month follow up .  Doing well with no asthma flare , on Dulera . Marland Kitchen  Need to establish with dentist > multiple teeth extracted   05/06/2016 NP Follow up ; Severe chronic asthma  Patient presents for 1 month follow up and med review We reviewed all his medications organize them into a medication count with patient education Appears he is taking medications correctly. However, does depend on samples. He did not have his Pepcid or Protonix today but says he has them at home. Says overall his breathing is doing well without any flare cough or wheezing. Seen by allergy Dr. Annamaria Boots on 04/23/16   , IgE 203, elevated eos Looking into Nucala.  Prevnar and PVX utd.  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 Is able to sing without sob.  rec No change rx   06/04/16 Dr Annamaria Boots rec Nucala > could not afford deductible       03/26/2019  f/u ov/Myalynn Lingle re:  Chronic asthma/ new BPV better with meclizine / maint on symbicort 160 2bid but sample dep/ Prednisone is plan D but not using  Chief Complaint  Patient presents with   Hospitalization Follow-up    Dizziness. Patient stated he has not been having any issues with his breathing.  Dyspnea:  Not limited by breathing from desired activities  /very sedentary / nl pace flat surfaces/ shopping ok  Cough: none Sleeping: bed flat  1-2 pillows  SABA use: neb once daily due to allergies but has not  started prednisone yet  02: none  rec Plan A = Automatic = symbicort 160 Take 2 puffs first thing in am and then another 2 puffs about 12 hours later.  Work on Education officer, community B = Backup Plan C = Crisis - only use your albuterol nebulizer if you first try Plan B and it fails to help Plan D = Deltasone  If doing ABC and not improving then prednisone 10 mg x 2 until better then 1 daily x 3 days and stop Plan E = ER - go to ER or call 911 if all else fails     04/19/2019  f/u ov/Mickell Birdwell re: chronic asthma  Chief Complaint  Patient presents with   Follow-up    Breathing is doing well today. He has not been using his albuterol inhaler or neb.  Dyspnea:  MMRC2 = can't walk a nl pace on a flat grade s sob but does fine slow and flat e Cough: clearing thorat a lot  Sleeping: ok flat/ one pillow SABA use: only has neb, hasn't used in a while  02: none Does not recall last time he needed pred as plan C rec Plan A = Automatic = symbicort 160 Take 2 puffs first thing in am and then another 2 puffs about 12 hours later.  Work on inhaler technique:  Plan B = albuterol inhaler but you don't have one so skip this step Plan C = Crisis - only use your albuterol nebulizer -ok to use the nebulizer up to every 4 hours but if start needing it regularly call for immediate appointment Plan D = Deltasone If need to use the nebulizer more than you do now then take prednsione 10 m x 2 each am until better then 1 daily  Plan E = ER - go to ER or call 911 if all else fails   Please schedule a follow up office visit in 4 weeks, sooner if needed  with all medications /inhalers/ solutions in hand so we can verify exactly what you are taking. This includes all medications from all doctors and over the counters - has 30 day samples of symbicort 160 with about 30 left on present rx     05/21/2019  f/u ov/Janes Colegrove re: confused again re action plan / the one med he doesn't have on him his his rescue saba  Chief  Complaint  Patient presents with   Follow-up    Breathing is overall doinwell well. He is using his ventolin about 2 x per day and has not used his neb lately. Needs refills on pepcid and meclizine.   Dyspnea:  MMRC2 = can't walk a nl pace on a flat grade s sob but does fine slow and flat  Cough: none Sleeping: lie flat ok  SABA use: twice daily whether he needs it or not  02: none  rec Plan A = Automatic = symbicort 160 Take 2 puffs first thing in am and then another 2 puffs about 12 hours later.  Work on Orthoptist B = Backup Only use your albuterol inhaler as a rescue medication Plan C = Crisis - only use your albuterol nebulizer if you first try Plan B and it fails to help > ok to use the nebulizer up to every 4 hours but if start needing it regularly call for immediate appointment Plan D = Deltasone If need to use the nebulizer more than you do now then take prednsione 10 m x 2 each am until better then 1 daily  X 5 day and stop  Please schedule a follow up office visit in 4 weeks, sooner if needed  with all medications /inhalers/ solutions in hand so we can verify exactly what you are taking. This includes all medications from all doctors and over the counters   08/08/2020  f/u ov/Brittan Butterbaugh re:  Chronic severe asthma/  No vaccination Roney Jaffe has not needed prednisone since  Last ov Chief Complaint  Patient presents with   Follow-up    pt is for asthma no complaints  Dyspnea:  MMRC2 = can't walk a nl pace on a flat grade s sob but does fine slow and flat   Cough: no Sleeping: able to lie flat  SABA use: rare proair 02: none  rec  I very strongly recommend you get the moderna or pfizer vaccine as soon as possible   See calendar for specific medication instructions    11/15/2020  f/u ov/Onnika Siebel re: chronic asthma / not vaccinated for covid or flu Chief Complaint  Patient presents with   Follow-up    voiced no complaints  Dyspnea:  MMRC2 = can't walk a nl pace on a flat  grade s sob but does fine slow and flat  Cough: no  Sleeping: ok bed is flat one pillow no resp symptoms SABA use: once a  week 02: no  Rec Plan A = Automatic = Always=  Symbicort 160 Take 2 puffs first thing in am and then another 2 puffs about 12 hours later.  Plan B = Backup (to supplement plan A, not to replace it) Only use your albuterol inhaler as a rescue medication Plan C = Crisis (instead of Plan B but only if Plan B stops working) - only use your albuterol nebulizer if you first try Plan B and it fails to help > ok to use the nebulizer up to every 4 hours but if start needing it regularly call for immediate appointment  I very strongly recommend you get the moderna or pfizer vaccine   Please schedule a follow up visit in 6 months but call sooner if needed    07/20/2021  f/u ov/Mckaylah Bettendorf re: severe chronic asthma with remodeling on symbicort 160  Chief Complaint  Patient presents with   Follow-up    Patient reports asthma is controlled.    Dyspnea:  walks 5-10 min some hills s stopping / hs some fleeting chest discomforts never related to exertion  Cough: no Sleeping: no resp complaints SABA use: rarely 02: no Covid status:   never vax  01/14/2022  f/u ov/Amran Malter re: severe chronic asthma    maint on ***  No chief complaint on file.   Dyspnea:  *** Cough: *** Sleeping: *** SABA use: *** 02: *** Covid status:   ***   No obvious day to day or daytime variability or assoc excess/ purulent sputum or mucus plugs or hemoptysis or cp or chest tightness, subjective wheeze or overt sinus or hb symptoms.   *** without nocturnal  or early am exacerbation  of respiratory  c/o's or need for noct saba. Also denies any obvious fluctuation of symptoms with weather or environmental changes or other aggravating or alleviating factors except as outlined above   No unusual exposure hx or h/o childhood pna/ asthma or knowledge of premature birth.  Current Allergies, Complete Past Medical  History, Past Surgical History, Family History, and Social History were reviewed in [employer] [employer] record.  ROS  The following are not active complaints unless bolded Hoarseness, sore throat, dysphagia, dental problems, itching, sneezing,  nasal congestion or discharge of excess mucus or purulent secretions, ear ache,   fever, chills, sweats, unintended wt loss or wt gain, classically pleuritic or exertional cp,  orthopnea pnd or arm/hand swelling  or leg swelling, presyncope, palpitations, abdominal pain, anorexia, nausea, vomiting, diarrhea  or change in bowel habits or change in bladder habits, change in stools or change in urine, dysuria, hematuria,  rash, arthralgias, visual complaints, headache, numbness, weakness or ataxia or problems with walking or coordination,  change in mood or  memory.        No outpatient medications have been marked as taking for the 01/14/22 encounter (Appointment) with Tanda Rockers, MD.                         Past Medical Hx ASTHMA (ICD-493.90)  --chronic severe asthma with best FEV1 2.2 L documented 01/2001 and  documented non-adherence Mainly lives off samples of inhalers and nose sprays  - HFA 75% June 15, 2009 > 90% August 02, 2009 > 100% December 27, 2009  - Off allergy vaccines x7/2010 > highly allergic skin tests per Dr Annamaria Boots > restarted vaccine November 06, 2009 > stopped  07/2013  SHOULDER PAIN, RIGHT (ICD-719.41)   HYPERTENSION, BORDERLINE (ICD-401.9)  Shingles T 8 Left 08/2000 HEALTH MAINTENANCE...............................................................................................Marland KitchenWert  - Colonoscopy 12/1989,  2011  - dT 07/25/2014  - Pneumovax 02/2003 and 05/2009 - Prevnar 07/25/14 -Flu declined October 17, 2008 and September 21, 2009 , declined flu shot 2015, 2017  -CPX 07/21/2019            Objective:  Physical Exam   Wts  01/14/2022     ***   07/20/2021    183  11/15/2020  184  08/08/2020   187 01/25/2020       193  09/29/2019    194  05/23/2014  201> 201 07/04/2014 > 07/25/14  196>198 08/22/2014 >194 09/19/2014 >198 10/11/2014 > 10/24/2014  196>   01/02/2015 193 > 03/06/2015  198 >198 04/03/2015 > 05/16/2015 196 >  06/02/2015 196 >197 06/16/2015 > 12/12/2015 199> 197 01/08/2016 >   02/16/2016     194 > 04/08/2016  193     Vital signs reviewed  01/14/2022  - Note at rest 02 sats  ***% on ***   General appearance:    ***    Mod bar***

## 2022-01-14 ENCOUNTER — Encounter: Payer: Self-pay | Admitting: Internal Medicine

## 2022-01-14 ENCOUNTER — Ambulatory Visit (INDEPENDENT_AMBULATORY_CARE_PROVIDER_SITE_OTHER): Payer: Medicare Other | Admitting: Internal Medicine

## 2022-01-14 ENCOUNTER — Ambulatory Visit: Payer: Medicare Other | Admitting: Internal Medicine

## 2022-01-14 ENCOUNTER — Other Ambulatory Visit: Payer: Self-pay

## 2022-01-14 DIAGNOSIS — J4550 Severe persistent asthma, uncomplicated: Secondary | ICD-10-CM | POA: Diagnosis not present

## 2022-01-14 DIAGNOSIS — N183 Chronic kidney disease, stage 3 unspecified: Secondary | ICD-10-CM

## 2022-01-14 DIAGNOSIS — E785 Hyperlipidemia, unspecified: Secondary | ICD-10-CM

## 2022-01-14 DIAGNOSIS — J302 Other seasonal allergic rhinitis: Secondary | ICD-10-CM | POA: Diagnosis not present

## 2022-01-14 DIAGNOSIS — J3089 Other allergic rhinitis: Secondary | ICD-10-CM | POA: Diagnosis not present

## 2022-01-14 LAB — CBC WITH DIFFERENTIAL/PLATELET
Basophils Absolute: 0 10*3/uL (ref 0.0–0.1)
Basophils Relative: 0.5 % (ref 0.0–3.0)
Eosinophils Absolute: 0.5 10*3/uL (ref 0.0–0.7)
Eosinophils Relative: 6.9 % — ABNORMAL HIGH (ref 0.0–5.0)
HCT: 45 % (ref 39.0–52.0)
Hemoglobin: 14.8 g/dL (ref 13.0–17.0)
Lymphocytes Relative: 39.2 % (ref 12.0–46.0)
Lymphs Abs: 2.8 10*3/uL (ref 0.7–4.0)
MCHC: 32.9 g/dL (ref 30.0–36.0)
MCV: 89.6 fl (ref 78.0–100.0)
Monocytes Absolute: 0.6 10*3/uL (ref 0.1–1.0)
Monocytes Relative: 8.7 % (ref 3.0–12.0)
Neutro Abs: 3.2 10*3/uL (ref 1.4–7.7)
Neutrophils Relative %: 44.7 % (ref 43.0–77.0)
Platelets: 332 10*3/uL (ref 150.0–400.0)
RBC: 5.03 Mil/uL (ref 4.22–5.81)
RDW: 13.7 % (ref 11.5–15.5)
WBC: 7.2 10*3/uL (ref 4.0–10.5)

## 2022-01-14 LAB — BASIC METABOLIC PANEL
BUN: 14 mg/dL (ref 6–23)
CO2: 26 mEq/L (ref 19–32)
Calcium: 9.6 mg/dL (ref 8.4–10.5)
Chloride: 108 mEq/L (ref 96–112)
Creatinine, Ser: 1.36 mg/dL (ref 0.40–1.50)
GFR: 48.74 mL/min — ABNORMAL LOW (ref >60.00)
Glucose, Bld: 82 mg/dL (ref 70–99)
Potassium: 4 mEq/L (ref 3.5–5.1)
Sodium: 142 mEq/L (ref 135–145)

## 2022-01-14 LAB — LIPID PANEL
Cholesterol: 209 mg/dL — ABNORMAL HIGH (ref 0–200)
HDL: 50.1 mg/dL (ref >39.00)
LDL Cholesterol: 131 mg/dL — ABNORMAL HIGH (ref 0–99)
NonHDL: 158.64
Total CHOL/HDL Ratio: 4
Triglycerides: 139 mg/dL (ref 0.0–149.0)
VLDL: 27.8 mg/dL (ref 0.0–40.0)

## 2022-01-14 LAB — TSH: TSH: 1.05 u[IU]/mL (ref 0.35–5.50)

## 2022-01-14 NOTE — Assessment & Plan Note (Addendum)
Onset in childhood  --chronic severe asthma with best FEV1 2.2 L documented 01/2001 and documented non-adherence Mainly lives off samples of inhalers and nose sprays  - Off allergy vaccines x7/2010 > highly allergic skin tests per Dr Annamaria Boots > restart vaccine November 06, 2009>stop 07/30/13 to watch - Spirometry on "good day" 01/14/12 =  FEV1 1.54  Ratio 0.58  - 02/16/2016 added prn prednisone when needing neb - FENO 04/23/16- 32  -CBC with differential 04/23/2016-eosinophils elevated 7%  - Spirometry 01/28/2017  FEV1 1.23 (49%)  Ratio 60    - Spirometry 08/11/2018  FEV1 1.43 (60%)  Ratio 60  - FENO 08/11/2018  =   30 on symb 160 2bid    - 01/14/2022  After extensive coaching inhaler device,  effectiveness =    75%   Adequate control on present rx, reviewed in detail with pt > no change in rx needed

## 2022-01-14 NOTE — Assessment & Plan Note (Signed)
-   Target LDL < 130 due to h/o hbp/ male gender  Lab Results  Component Value Date   CHOL 209 (H) 01/14/2022   HDL 50.10 01/14/2022   LDLCALC 131 (H) 01/14/2022   LDLDIRECT 124.7 04/30/2013   TRIG 139.0 01/14/2022   CHOLHDL 4 01/14/2022    Adequate control on present rx, reviewed in detail with pt > no change in rx needed    Referred to IM with f/u pulmonary clinic q 6 m, sooner prn          Each maintenance medication was reviewed in detail including emphasizing most importantly the difference between maintenance and prns and under what circumstances the prns are to be triggered using an action plan format where appropriate.  Total time for H and P, chart review, counseling, reviewing hfa device(s) and generating customized AVS unique to this office visit / same day charting = 25 min

## 2022-01-14 NOTE — Progress Notes (Signed)
Subjective:   Patient ID: [name]    DOB: Jan 09, 1941     Brief patient profile: 80 yobm denies ever smoking but has lifelong asthma with severe chronic pattern and best FEV1 of around 2.2 liters documented 2002 and also documented non-adherence  followed in pulmonary for primary care also for hbp and hyperlipidemia.   History of Present Illness    07/30/13 Allergy eval rx completed x 4 years > d/c'd and f/u allergy prn    04/03/2015 Follow up : chronic asthma/ HTN/non adherence / relies on samples/ no formulary / has med calendar  Pt returns for 1 month follow up .  Doing well with no asthma flare , on Dulera . Marland Kitchen  Need to establish with dentist > multiple teeth extracted   05/06/2016 NP Follow up ; Severe chronic asthma  Patient presents for 1 month follow up and med review We reviewed all his medications organize them into a medication count with patient education Appears he is taking medications correctly. However, does depend on samples. He did not have his Pepcid or Protonix today but says he has them at home. Says overall his breathing is doing well without any flare cough or wheezing. Seen by allergy Dr. Annamaria Boots on 04/23/16   , IgE 203, elevated eos Looking into Nucala.  Prevnar and PVX utd.  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 Is able to sing without sob.  rec No change rx   06/04/16 Dr Annamaria Boots rec Nucala > could not afford deductible       03/26/2019  f/u ov/[name] re:  Chronic asthma/ new BPV better with meclizine / maint on symbicort 160 2bid but sample dep/ Prednisone is plan D but not using  Chief Complaint  Patient presents with   Hospitalization Follow-up    Dizziness. Patient stated he has not been having any issues with his breathing.  Dyspnea:  Not limited by breathing from desired activities  /very sedentary / nl pace flat surfaces/ shopping ok  Cough: none Sleeping: bed flat  1-2 pillows  SABA use: neb once daily due to allergies but has not  started prednisone yet  02: none  rec Plan A = Automatic = symbicort 160 Take 2 puffs first thing in am and then another 2 puffs about 12 hours later.  Work on Education officer, community B = Backup Plan C = Crisis - only use your albuterol nebulizer if you first try Plan B and it fails to help Plan D = Deltasone  If doing ABC and not improving then prednisone 10 mg x 2 until better then 1 daily x 3 days and stop Plan E = ER - go to ER or call 911 if all else fails     04/19/2019  f/u ov/[name] re: chronic asthma  Chief Complaint  Patient presents with   Follow-up    Breathing is doing well today. He has not been using his albuterol inhaler or neb.  Dyspnea:  MMRC2 = can't walk a nl pace on a flat grade s sob but does fine slow and flat e Cough: clearing thorat a lot  Sleeping: ok flat/ one pillow SABA use: only has neb, hasn't used in a while  02: none Does not recall last time he needed pred as plan C rec Plan A = Automatic = symbicort 160 Take 2 puffs first thing in am and then another 2 puffs about 12 hours later.  Work on inhaler technique:  Plan B = albuterol inhaler but you don't have one so skip this step Plan C = Crisis - only use your albuterol nebulizer -ok to use the nebulizer up to every 4 hours but if start needing it regularly call for immediate appointment Plan D = Deltasone If need to use the nebulizer more than you do now then take prednsione 10 m x 2 each am until better then 1 daily  Plan E = ER - go to ER or call 911 if all else fails   Please schedule a follow up office visit in 4 weeks, sooner if needed  with all medications /inhalers/ solutions in hand so we can verify exactly what you are taking. This includes all medications from all doctors and over the counters - has 30 day samples of symbicort 160 with about 30 left on present rx     05/21/2019  f/u ov/[name] re: confused again re action plan / the one med he doesn't have on him his his rescue saba  Chief  Complaint  Patient presents with   Follow-up    Breathing is overall doinwell well. He is using his ventolin about 2 x per day and has not used his neb lately. Needs refills on pepcid and meclizine.   Dyspnea:  MMRC2 = can't walk a nl pace on a flat grade s sob but does fine slow and flat  Cough: none Sleeping: lie flat ok  SABA use: twice daily whether he needs it or not  02: none  rec Plan A = Automatic = symbicort 160 Take 2 puffs first thing in am and then another 2 puffs about 12 hours later.  Work on Orthoptist B = Backup Only use your albuterol inhaler as a rescue medication Plan C = Crisis - only use your albuterol nebulizer if you first try Plan B and it fails to help > ok to use the nebulizer up to every 4 hours but if start needing it regularly call for immediate appointment Plan D = Deltasone If need to use the nebulizer more than you do now then take prednsione 10 m x 2 each am until better then 1 daily  X 5 day and stop  Please schedule a follow up office visit in 4 weeks, sooner if needed  with all medications /inhalers/ solutions in hand so we can verify exactly what you are taking. This includes all medications from all doctors and over the counters   08/08/2020  f/u ov/[name] re:  Chronic severe asthma/  No vaccination [name] has not needed prednisone since  Last ov Chief Complaint  Patient presents with   Follow-up    pt is for asthma no complaints  Dyspnea:  MMRC2 = can't walk a nl pace on a flat grade s sob but does fine slow and flat   Cough: no Sleeping: able to lie flat  SABA use: rare proair 02: none  rec  I very strongly recommend you get the moderna or pfizer vaccine as soon as possible   See calendar for specific medication instructions    11/15/2020  f/u ov/[name] re: chronic asthma / not vaccinated for covid or flu Chief Complaint  Patient presents with   Follow-up    voiced no complaints  Dyspnea:  MMRC2 = can't walk a nl pace on a flat  grade s sob but does fine slow and flat  Cough: no  Sleeping: ok bed is flat one pillow no resp symptoms SABA use: once a  week 02: no  Rec Plan A = Automatic = Always=  Symbicort 160 Take 2 puffs first thing in am and then another 2 puffs about 12 hours later.  Plan B = Backup (to supplement plan A, not to replace it) Only use your albuterol inhaler as a rescue medication Plan C = Crisis (instead of Plan B but only if Plan B stops working) - only use your albuterol nebulizer if you first try Plan B and it fails to help > ok to use the nebulizer up to every 4 hours but if start needing it regularly call for immediate appointment  I very strongly recommend you get the moderna or pfizer vaccine   Please schedule a follow up visit in 6 months but call sooner if needed    07/20/2021  f/u ov/[name] re: severe chronic asthma with remodeling on symbicort 160  Chief Complaint  Patient presents with   Follow-up    Patient reports asthma is controlled.    Dyspnea:  walks 5-10 min some hills s stopping / hs some fleeting chest discomforts never related to exertion  Cough: no Sleeping: no resp complaints SABA use: rarely 02: no Covid status:   never vax Rec We will refer you to internal medicine out your way. No change in your medications  Please schedule a follow up visit in 6 months but call sooner if needed  with all medications /inhalers/ solutions in hand so we can verify exactly what you are taking. This includes all medications from all doctors and over the counters   01/14/2022  f/u ov/[name] re: severe chronic asthma maint on symbicocrt 160 2bid  pred as plan D Chief Complaint  Patient presents with   Follow-up  Dyspnea:  neighborhood walking up some hills ok / slower than others  Cough: none Sleeping: no resp cc  SABA use: not using  02: none  Covid status:   declines    No obvious day to day or daytime variability or assoc excess/ purulent sputum or mucus plugs or hemoptysis or cp  or chest tightness, subjective wheeze or overt sinus or hb symptoms.   Sleeping  without nocturnal  or early am exacerbation  of respiratory  c/o's or need for noct saba. Also denies any obvious fluctuation of symptoms with weather or environmental changes or other aggravating or alleviating factors except as outlined above   No unusual exposure hx or h/o childhood pna  or knowledge of premature birth.  Current Allergies, Complete Past Medical History, Past Surgical History, Family History, and Social History were reviewed in [employer] record.  ROS  The following are not active complaints unless bolded Hoarseness, sore throat, dysphagia, dental problems, itching, sneezing,  nasal congestion or discharge of excess mucus or purulent secretions, ear ache,   fever, chills, sweats, unintended wt loss or wt gain, classically pleuritic or exertional cp,  orthopnea pnd or arm/hand swelling  or leg swelling, presyncope, palpitations, abdominal pain, anorexia, nausea, vomiting, diarrhea  or change in bowel habits or change in bladder habits, change in stools or change in urine, dysuria, hematuria,  rash, arthralgias, visual complaints, headache, numbness, weakness or ataxia or problems with walking or coordination,  change in mood or  memory.        Current Meds  Medication Sig   albuterol (PROAIR HFA) 108 (90 Base) MCG/ACT inhaler 2 puffs every 4 hours as needed for wheezing ((PLANB))   albuterol (PROVENTIL) (2.5 MG/3ML) 0.083% nebulizer solution USE 1 VIAL IN  NEBULIZER EVERY 4 HOURS AS NEEDED   Ascorbic Acid (VITAMIN C) 1000 MG tablet Take 1,000 mg by mouth daily.   aspirin 81 MG EC tablet Take 81 mg by mouth every morning.   budesonide-formoterol (SYMBICORT) 160-4.5 MCG/ACT inhaler Take 2 puffs first thing in am and then another 2 puffs about 12 hours later.   CHELATED MAGNESIUM PO Take 1 tablet by mouth daily.   cholecalciferol (VITAMIN D3) 25 MCG (1000 UT) tablet Take 1,000  Units by mouth daily.   cyanocobalamin 1000 MCG tablet Take 1,000 mcg by mouth daily.   famotidine (PEPCID) 20 MG tablet Take 20 mg by mouth daily.   guaifenesin (HUMIBID E) 400 MG TABS tablet Take 400 mg by mouth every 4 (four) hours as needed.   Ibuprofen 200 MG CAPS Take 200 mg by mouth every 6 (six) hours as needed (headache/pain).    meclizine (ANTIVERT) 12.5 MG tablet TAKE 1 TABLET BY MOUTH 4 TIMES A DAY AS NEEDED FOR DIZZINESS   montelukast (SINGULAIR) 10 MG tablet Take 1 tablet (10 mg total) by mouth at bedtime.   oxymetazoline (AFRIN) 0.05 % nasal spray Place 1 spray into both nostrils 2 (two) times daily as needed for congestion.   Polyvinyl Alcohol-Povidone (REFRESH OP) Apply 1 drop to eye as needed (dry eyes).    predniSONE (DELTASONE) 10 MG tablet 2 DAILY UNTIL BETTER, THEN 1 X 3 DAYS AND STOP   Respiratory Therapy Supplies (FLUTTER) DEVI Use every 4 hours as needed for cough and congestion                         Past Medical Hx ASTHMA (ICD-493.90)  --chronic severe asthma with best FEV1 2.2 L documented 01/2001 and  documented non-adherence Mainly lives off samples of inhalers and nose sprays  - HFA 75% June 15, 2009 > 90% August 02, 2009 > 100% December 27, 2009  - Off allergy vaccines x7/2010 > highly allergic skin tests per Dr Annamaria Boots > restarted vaccine November 06, 2009 > stopped  07/2013  SHOULDER PAIN, RIGHT (ICD-719.41)   HYPERTENSION, BORDERLINE (ICD-401.9)  Shingles T 8 Left 08/2000 HEALTH MAINTENANCE...............................................................................................Marland KitchenWert  - Colonoscopy 12/1989,  2011  - dT 07/25/2014  - Pneumovax 02/2003 and 05/2009 - Prevnar 07/25/14 -Flu declined October 17, 2008 and September 21, 2009 , declined flu shot 2015, 2017  -CPX 07/21/2019            Objective:  Physical Exam   Wts  01/14/2022     183   07/20/2021    183  11/15/2020  184  08/08/2020   187 01/25/2020      193  09/29/2019    194   05/23/2014  201> 201 07/04/2014 > 07/25/14  196>198 08/22/2014 >194 09/19/2014 >198 10/11/2014 > 10/24/2014  196>   01/02/2015 193 > 03/06/2015  198 >198 04/03/2015 > 05/16/2015 196 >  06/02/2015 196 >197 06/16/2015 > 12/12/2015 199> 197 01/08/2016 >   02/16/2016     194 > 04/08/2016  193     Vital signs reviewed  01/14/2022  - Note at rest 02 sats  94% on RA   General appearance:    amb elderly wm nad    HEENT : pt wearing mask not removed for exam due to covid -19 concerns.    NECK :  without JVD/Nodes/TM/ nl carotid upstrokes bilaterally   LUNGS: no acc muscle use,  Mod barrel  contour chest wall with bilateral  Distant bs s  audible wheeze and  without cough on insp or exp maneuvers and mod  Hyperresonant  to  percussion bilaterally     CV:  RRR  no s3 or murmur or increase in P2, and no edema   ABD:  soft and nontender with pos mid insp Hoover's  in the supine position. No bruits or organomegaly appreciated, bowel sounds nl  MS:     ext warm without deformities, calf tenderness, cyanosis or clubbing No obvious joint restrictions   SKIN: warm and dry without lesions    NEURO:  alert, approp, nl sensorium with  no motor or cerebellar deficits apparent.       Labs ordered/ reviewed:      Chemistry      Component Value Date/Time   NA 142 01/14/2022 1159   K 4.0 01/14/2022 1159   CL 108 01/14/2022 1159   CO2 26 01/14/2022 1159   BUN 14 01/14/2022 1159   CREATININE 1.36 01/14/2022 1159      Component Value Date/Time   CALCIUM 9.6 01/14/2022 1159   ALKPHOS 60 03/20/2021 1203   AST 20 03/20/2021 1203   ALT 16 03/20/2021 1203   BILITOT 0.6 03/20/2021 1203        Lab Results  Component Value Date   WBC 7.2 01/14/2022   HGB 14.8 01/14/2022   HCT 45.0 01/14/2022   MCV 89.6 01/14/2022   PLT 332.0 01/14/2022      Lab Results  Component Value Date   TSH 1.05 01/14/2022

## 2022-01-14 NOTE — Patient Instructions (Signed)
I will be referring you again to [facility] Internal medicine    No change in medications   Please schedule a follow up visit in 6 months but call sooner if needed

## 2022-01-14 NOTE — Assessment & Plan Note (Signed)
Lab Results  Component Value Date   CREATININE 1.36 01/14/2022   CREATININE 1.27 03/20/2021   CREATININE 1.36 07/21/2019     Referred to IM for f/u

## 2022-01-15 LAB — IGE: IgE (Immunoglobulin E), Serum: 233 kU/L — ABNORMAL HIGH (ref <=114)

## 2022-01-21 ENCOUNTER — Ambulatory Visit: Payer: Medicare Other | Admitting: Internal Medicine

## 2022-01-22 DIAGNOSIS — M9901 Segmental and somatic dysfunction of cervical region: Secondary | ICD-10-CM | POA: Diagnosis not present

## 2022-01-22 DIAGNOSIS — M9902 Segmental and somatic dysfunction of thoracic region: Secondary | ICD-10-CM | POA: Diagnosis not present

## 2022-01-22 DIAGNOSIS — M62830 Muscle spasm of back: Secondary | ICD-10-CM | POA: Diagnosis not present

## 2022-01-22 DIAGNOSIS — M9903 Segmental and somatic dysfunction of lumbar region: Secondary | ICD-10-CM | POA: Diagnosis not present

## 2022-01-28 DIAGNOSIS — Z20822 Contact with and (suspected) exposure to covid-19: Secondary | ICD-10-CM | POA: Diagnosis not present

## 2022-01-29 DIAGNOSIS — M9903 Segmental and somatic dysfunction of lumbar region: Secondary | ICD-10-CM | POA: Diagnosis not present

## 2022-01-29 DIAGNOSIS — M62830 Muscle spasm of back: Secondary | ICD-10-CM | POA: Diagnosis not present

## 2022-01-29 DIAGNOSIS — M9902 Segmental and somatic dysfunction of thoracic region: Secondary | ICD-10-CM | POA: Diagnosis not present

## 2022-01-29 DIAGNOSIS — M9901 Segmental and somatic dysfunction of cervical region: Secondary | ICD-10-CM | POA: Diagnosis not present

## 2022-02-24 ENCOUNTER — Other Ambulatory Visit: Payer: Self-pay | Admitting: Adult Health

## 2022-03-07 DIAGNOSIS — M9901 Segmental and somatic dysfunction of cervical region: Secondary | ICD-10-CM | POA: Diagnosis not present

## 2022-03-07 DIAGNOSIS — M9902 Segmental and somatic dysfunction of thoracic region: Secondary | ICD-10-CM | POA: Diagnosis not present

## 2022-03-07 DIAGNOSIS — M9903 Segmental and somatic dysfunction of lumbar region: Secondary | ICD-10-CM | POA: Diagnosis not present

## 2022-03-07 DIAGNOSIS — M62830 Muscle spasm of back: Secondary | ICD-10-CM | POA: Diagnosis not present

## 2022-03-19 ENCOUNTER — Other Ambulatory Visit: Payer: Self-pay | Admitting: Primary Care

## 2022-03-20 ENCOUNTER — Telehealth: Payer: Self-pay | Admitting: *Deleted

## 2022-03-20 ENCOUNTER — Ambulatory Visit: Payer: Medicare Other | Admitting: Internal Medicine

## 2022-03-20 DIAGNOSIS — Z20822 Contact with and (suspected) exposure to covid-19: Secondary | ICD-10-CM | POA: Diagnosis not present

## 2022-03-20 NOTE — Telephone Encounter (Signed)
Patient arrived at 11:50 am for his 11:30 am appointment.  I spoke with patient, she had a nose bleed, but it was not severe, it was just when he blew his nose.  I advised him to let him know if it continued to be a problem to let us know.  He stated he did not need to be see for an acute visit today.  I advised him I had called him earlier, he stated that he was in the bathroom.  I advised him to use saline nasal spray and saline nasal gel in his nose to keep it moist so it will not get too dry and bleed.  He verbalized understanding.  Nothing further needed. ?

## 2022-04-02 ENCOUNTER — Telehealth: Payer: Self-pay | Admitting: Internal Medicine

## 2022-04-02 NOTE — Telephone Encounter (Signed)
Patient called in requesting a visit with Dr Melvyn Novas to be seen for an issue that he's having with his bladder. Patient states that Dr Melvyn Novas was his PCP, and used to see him for the issue. During patient last visit with Dr Melvyn Novas, he was told that he would get him a new PCP who could help him with this issue. Patient states that he continues to squirt urine all over himself, and he needs to see, or speak to Dr Melvyn Novas to know what he should do to get himself checked out. Marking High Priority per [employer] ?

## 2022-04-02 NOTE — Telephone Encounter (Signed)
Called patient but he did not answer. Left message for him to call back.  

## 2022-04-04 DIAGNOSIS — Z20822 Contact with and (suspected) exposure to covid-19: Secondary | ICD-10-CM | POA: Diagnosis not present

## 2022-04-20 DIAGNOSIS — Z20822 Contact with and (suspected) exposure to covid-19: Secondary | ICD-10-CM | POA: Diagnosis not present

## 2022-04-26 DIAGNOSIS — M9903 Segmental and somatic dysfunction of lumbar region: Secondary | ICD-10-CM | POA: Diagnosis not present

## 2022-04-26 DIAGNOSIS — M9901 Segmental and somatic dysfunction of cervical region: Secondary | ICD-10-CM | POA: Diagnosis not present

## 2022-04-26 DIAGNOSIS — M9902 Segmental and somatic dysfunction of thoracic region: Secondary | ICD-10-CM | POA: Diagnosis not present

## 2022-04-26 DIAGNOSIS — M62830 Muscle spasm of back: Secondary | ICD-10-CM | POA: Diagnosis not present

## 2022-05-02 DIAGNOSIS — M9903 Segmental and somatic dysfunction of lumbar region: Secondary | ICD-10-CM | POA: Diagnosis not present

## 2022-05-02 DIAGNOSIS — M9902 Segmental and somatic dysfunction of thoracic region: Secondary | ICD-10-CM | POA: Diagnosis not present

## 2022-05-02 DIAGNOSIS — M62830 Muscle spasm of back: Secondary | ICD-10-CM | POA: Diagnosis not present

## 2022-05-02 DIAGNOSIS — M9901 Segmental and somatic dysfunction of cervical region: Secondary | ICD-10-CM | POA: Diagnosis not present

## 2022-05-08 ENCOUNTER — Encounter: Payer: Self-pay | Admitting: Emergency Medicine

## 2022-05-08 ENCOUNTER — Ambulatory Visit (INDEPENDENT_AMBULATORY_CARE_PROVIDER_SITE_OTHER): Payer: Medicare Other | Admitting: Emergency Medicine

## 2022-05-08 VITALS — BP 154/86 | HR 81 | Temp 98.0°F | Ht 68.0 in | Wt 189.4 lb

## 2022-05-08 DIAGNOSIS — J4550 Severe persistent asthma, uncomplicated: Secondary | ICD-10-CM | POA: Diagnosis not present

## 2022-05-08 DIAGNOSIS — Z7689 Persons encountering health services in other specified circumstances: Secondary | ICD-10-CM | POA: Diagnosis not present

## 2022-05-08 DIAGNOSIS — I10 Essential (primary) hypertension: Secondary | ICD-10-CM | POA: Diagnosis not present

## 2022-05-08 MED ORDER — AMLODIPINE BESYLATE 5 MG PO TABS: 5.0000 mg | ORAL_TABLET | Freq: Every day | ORAL | 3 refills | Status: AC

## 2022-05-08 NOTE — Progress Notes (Signed)
[name] ?81 y.o. ? ? ?Chief Complaint  ?Patient presents with  ? New Patient (Initial Visit)  ? ? ?HISTORY OF PRESENT ILLNESS: ?This is a 81 y.o. male first visit to this office, here to establish care with me. ?Patient has a history of asthma sees Dr. Melvyn Novas on a regular basis.  Uses Symbicort twice a day with good results. ?History of hypertension but presently on no medications. ?Healthy lifestyle. ?Has no complaints or any other medical concerns today. ? ?HPI ? ? ?Prior to Admission medications   ?Medication Sig Start Date End Date Taking? Authorizing Provider  ?albuterol (PROAIR HFA) 108 (90 Base) MCG/ACT inhaler 2 puffs every 4 hours as needed for wheezing ((PLANB)) 05/08/20  Yes Martyn Ehrich, NP  ?albuterol (PROVENTIL) (2.5 MG/3ML) 0.083% nebulizer solution USE 1 VIAL IN NEBULIZER EVERY 4 HOURS AS NEEDED 07/20/21  Yes Tanda Rockers, MD  ?amLODipine (NORVASC) 5 MG tablet Take 1 tablet (5 mg total) by mouth daily. 05/08/22  Yes Tran Arzuaga, Ines Bloomer, MD  ?Ascorbic Acid (VITAMIN C) 1000 MG tablet Take 1,000 mg by mouth daily.   Yes [provider]  ?aspirin 81 MG EC tablet Take 81 mg by mouth every morning.   Yes [provider]  ?budesonide-formoterol (SYMBICORT) 160-4.5 MCG/ACT inhaler TAKE 2 PUFFS FIRST THING IN AM AND THEN ANOTHER 2 PUFFS ABOUT 12 HOURS LATER. 03/20/22  Yes Tanda Rockers, MD  ?CHELATED MAGNESIUM PO Take 1 tablet by mouth daily.   Yes [provider]  ?cholecalciferol (VITAMIN D3) 25 MCG (1000 UT) tablet Take 1,000 Units by mouth daily.   Yes [provider]  ?cyanocobalamin 1000 MCG tablet Take 1,000 mcg by mouth daily.   Yes [provider]  ?famotidine (PEPCID) 20 MG tablet Take 20 mg by mouth daily.   Yes [provider]  ?Ibuprofen 200 MG CAPS Take 200 mg by mouth every 6 (six) hours as needed (headache/pain).    Yes [provider]  ?meclizine (ANTIVERT) 12.5 MG tablet TAKE 1 TABLET BY MOUTH 4 TIMES A DAY AS  NEEDED FOR DIZZINESS 10/05/21  Yes Tanda Rockers, MD  ?montelukast (SINGULAIR) 10 MG tablet Take 1 tablet (10 mg total) by mouth at bedtime. 05/08/20  Yes Martyn Ehrich, NP  ?oxymetazoline (AFRIN) 0.05 % nasal spray Place 1 spray into both nostrils 2 (two) times daily as needed for congestion.   Yes [provider]  ?Polyvinyl Alcohol-Povidone (REFRESH OP) Apply 1 drop to eye as needed (dry eyes).    Yes [provider]  ?predniSONE (DELTASONE) 10 MG tablet 2 DAILY UNTIL BETTER, THEN 1 X 3 DAYS AND STOP 11/19/21  Yes Tanda Rockers, MD  ?guaifenesin (HUMIBID E) 400 MG TABS tablet Take 400 mg by mouth every 4 (four) hours as needed. ?Patient not taking: Reported on 05/08/2022    [provider]  ?Respiratory Therapy Supplies (FLUTTER) DEVI Use every 4 hours as needed for cough and congestion ?Patient not taking: Reported on 05/08/2022    [provider]  ? ? ?No Known Allergies ? ?Patient Active Problem List  ? Diagnosis Date Noted  ? Hx of adenomatous colonic polyps 06/25/2015  ? Esophageal stricture   ? Essential hypertension, benign 09/05/2011  ? Hyperlipidemia 08/08/2011  ? Esophageal dysphagia 03/12/2011  ? GERD 11/06/2009  ? Seasonal and perennial allergic rhinitis 05/31/2009  ? Severe persistent asthma 01/04/2008  ? ? ?Past Medical History:  ?Diagnosis Date  ? Acid reflux   ? Asthma 06/18/2011  ?  Borderline hypertension   ? Cataract   ? Colon polyps 2011  ? 1 Hyperplastic and 1 Tubular Adenoma   ? Dysphagia   ? Esophageal stricture   ? GERD (gastroesophageal reflux disease)   ? Hx of adenomatous colonic polyps 06/25/2015  ? Personal history of colonic polyps 12/1989  ? Shingles   ? Shoulder pain, right   ? Unspecified asthma(493.90)   ? Urinary frequency   ? ? ?Past Surgical History:  ?Procedure Laterality Date  ? BALLOON DILATION N/A 02/07/2015  ? Procedure: BALLOON DILATION;  Surgeon: Gatha Mayer, MD;  Location: [facility];  Service: Endoscopy;  Laterality: N/A;  ?  ESOPHAGOGASTRODUODENOSCOPY (EGD) WITH PROPOFOL N/A 02/07/2015  ? Procedure: ESOPHAGOGASTRODUODENOSCOPY (EGD) WITH PROPOFOL;  Surgeon: Gatha Mayer, MD;  Location: [facility];  Service: Endoscopy;  Laterality: N/A;  ? HEMORRHOID SURGERY    ? HERNIA REPAIR    ? MASS EXCISION N/A 08/14/2016  ? Procedure: EXCISION SEBACEOUS CYST BACK;  Surgeon: Johnathan Hausen, MD;  Location: [facility];  Service: General;  Laterality: N/A;  EXCISION SEBACEOUS CYST BACK  ? TONSILLECTOMY    ? ? ?Social History  ? ?Socioeconomic History  ? Marital status: Married  ?  Spouse name: Not on file  ? Number of children: 7  ? Years of education: Not on file  ? Highest education level: Not on file  ?Occupational History  ? Occupation: Administrator  ?  Comment: retired  ?  Employer: RETIRED  ?Tobacco Use  ? Smoking status: Never  ? Smokeless tobacco: Never  ?Vaping Use  ? Vaping Use: Never used  ?Substance and Sexual Activity  ? Alcohol use: No  ? Drug use: No  ? Sexual activity: Not on file  ?Other Topics Concern  ? Not on file  ?Social History Narrative  ? Patient is separated from spouse  ? ?Social Determinants of Health  ? ?Financial Resource Strain: Not on file  ?Food Insecurity: Not on file  ?Transportation Needs: Not on file  ?Physical Activity: Not on file  ?Stress: Not on file  ?Social Connections: Not on file  ?Intimate Partner Violence: Not on file  ? ? ?Family History  ?Problem Relation Age of Onset  ? Heart disease Mother   ? Kidney failure Sister   ? Breast cancer Sister   ? Lung cancer Brother   ? Brain cancer Sister   ? Asthma Sister   ? Colon cancer Neg Hx   ? ? ? ?Review of Systems  ?Constitutional: Negative.  Negative for chills and fever.  ?HENT: Negative.  Negative for congestion and sore throat.   ?Respiratory: Negative.  Negative for cough and shortness of breath.   ?Cardiovascular: Negative.  Negative for chest pain and palpitations.  ?Gastrointestinal:  Negative for abdominal pain, diarrhea, nausea and  vomiting.  ?Skin: Negative.  Negative for rash.  ?Neurological: Negative.  Negative for dizziness and headaches.  ?All other systems reviewed and are negative. ?Today's Vitals  ? 05/08/22 1554 05/08/22 1556  ?BP: (!) 160/84 (!) 154/86  ?Pulse: 81   ?Temp: 98 ?F (36.7 ?C)   ?TempSrc: Oral   ?SpO2: 94%   ?Weight: 189 lb 6 oz (85.9 kg)   ?Height: '5\' 8"'$  (1.727 m)   ? ?Body mass index is 28.79 kg/m?. ? ? ?Physical Exam ?Vitals reviewed.  ?Constitutional:   ?   Appearance: Normal appearance.  ?HENT:  ?   Head: Normocephalic.  ?Eyes:  ?   Extraocular Movements: Extraocular movements intact.  ?  Pupils: Pupils are equal, round, and reactive to light.  ?Cardiovascular:  ?   Rate and Rhythm: Normal rate and regular rhythm.  ?   Pulses: Normal pulses.  ?   Heart sounds: Normal heart sounds.  ?Pulmonary:  ?   Effort: Pulmonary effort is normal.  ?   Breath sounds: Normal breath sounds.  ?Abdominal:  ?   Palpations: Abdomen is soft.  ?   Tenderness: There is no abdominal tenderness.  ?Musculoskeletal:  ?   Cervical back: No tenderness.  ?   Right lower leg: No edema.  ?   Left lower leg: No edema.  ?Lymphadenopathy:  ?   Cervical: No cervical adenopathy.  ?Skin: ?   General: Skin is warm and dry.  ?Neurological:  ?   General: No focal deficit present.  ?   Mental Status: He is alert and oriented to person, place, and time.  ?Psychiatric:     ?   Mood and Affect: Mood normal.     ?   Behavior: Behavior normal.  ? ? ? ?ASSESSMENT & PLAN: ?A total of 48 minutes was spent with the patient and counseling/coordination of care regarding preparing for this visit, review of available medical records, review of multiple chronic medical conditions and their management, review of all medications, review of most recent blood work results, establishing care with me, education on nutrition, comprehensive history and physical examination, prognosis, documentation, need for follow-up. ? ?Problem List Items Addressed This Visit   ? ?  ?  Cardiovascular and Mediastinum  ? Essential hypertension - Primary  ?  Uncontrolled hypertension.  We will start amlodipine 5 mg daily. ?Dietary approaches to stop hypertension discussed. ?BP Readings from Last 3 Encoun

## 2022-05-08 NOTE — Assessment & Plan Note (Signed)
Uncontrolled hypertension.  We will start amlodipine 5 mg daily. ?Dietary approaches to stop hypertension discussed. ?BP Readings from Last 3 Encounters:  ?05/08/22 (!) 154/86  ?01/14/22 126/74  ?07/20/21 126/80  ? ? ?

## 2022-05-08 NOTE — Patient Instructions (Signed)
Hypertension, Adult High blood pressure (hypertension) is when the force of blood pumping through the arteries is too strong. The arteries are the blood vessels that carry blood from the heart throughout the body. Hypertension forces the heart to work harder to pump blood and may cause arteries to become narrow or stiff. Untreated or uncontrolled hypertension can lead to a heart attack, heart failure, a stroke, kidney disease, and other problems. A blood pressure reading consists of a higher number over a lower number. Ideally, your blood pressure should be below 120/80. The first ("top") number is called the systolic pressure. It is a measure of the pressure in your arteries as your heart beats. The second ("bottom") number is called the diastolic pressure. It is a measure of the pressure in your arteries as the heart relaxes. What are the causes? The exact cause of this condition is not known. There are some conditions that result in high blood pressure. What increases the risk? Certain factors may make you more likely to develop high blood pressure. Some of these risk factors are under your control, including: Smoking. Not getting enough exercise or physical activity. Being overweight. Having too much fat, sugar, calories, or salt (sodium) in your diet. Drinking too much alcohol. Other risk factors include: Having a personal history of heart disease, diabetes, high cholesterol, or kidney disease. Stress. Having a family history of high blood pressure and high cholesterol. Having obstructive sleep apnea. Age. The risk increases with age. What are the signs or symptoms? High blood pressure may not cause symptoms. Very high blood pressure (hypertensive crisis) may cause: Headache. Fast or irregular heartbeats (palpitations). Shortness of breath. Nosebleed. Nausea and vomiting. Vision changes. Severe chest pain, dizziness, and seizures. How is this diagnosed? This condition is diagnosed by  measuring your blood pressure while you are seated, with your arm resting on a flat surface, your legs uncrossed, and your feet flat on the floor. The cuff of the blood pressure monitor will be placed directly against the skin of your upper arm at the level of your heart. Blood pressure should be measured at least twice using the same arm. Certain conditions can cause a difference in blood pressure between your right and left arms. If you have a high blood pressure reading during one visit or you have normal blood pressure with other risk factors, you may be asked to: Return on a different day to have your blood pressure checked again. Monitor your blood pressure at home for 1 week or longer. If you are diagnosed with hypertension, you may have other blood or imaging tests to help your health care provider understand your overall risk for other conditions. How is this treated? This condition is treated by making healthy lifestyle changes, such as eating healthy foods, exercising more, and reducing your alcohol intake. You may be referred for counseling on a healthy diet and physical activity. Your health care provider may prescribe medicine if lifestyle changes are not enough to get your blood pressure under control and if: Your systolic blood pressure is above 130. Your diastolic blood pressure is above 80. Your personal target blood pressure may vary depending on your medical conditions, your age, and other factors. Follow these instructions at home: Eating and drinking  Eat a diet that is high in fiber and potassium, and low in sodium, added sugar, and fat. An example of this eating plan is called the DASH diet. DASH stands for Dietary Approaches to Stop Hypertension. To eat this way: Eat   plenty of fresh fruits and vegetables. Try to fill one half of your plate at each meal with fruits and vegetables. Eat whole grains, such as whole-wheat pasta, brown rice, or whole-grain bread. Fill about one  fourth of your plate with whole grains. Eat or drink low-fat dairy products, such as skim milk or low-fat yogurt. Avoid fatty cuts of meat, processed or cured meats, and poultry with skin. Fill about one fourth of your plate with lean proteins, such as fish, chicken without skin, beans, eggs, or tofu. Avoid pre-made and processed foods. These tend to be higher in sodium, added sugar, and fat. Reduce your daily sodium intake. Many people with hypertension should eat less than 1,500 mg of sodium a day. Do not drink alcohol if: Your health care provider tells you not to drink. You are pregnant, may be pregnant, or are planning to become pregnant. If you drink alcohol: Limit how much you have to: 0-1 drink a day for women. 0-2 drinks a day for men. Know how much alcohol is in your drink. In the [place] one drink equals one 12 oz bottle of beer (355 mL), one 5 oz glass of wine (148 mL), or one 1 oz glass of hard liquor (44 mL). Lifestyle  Work with your health care provider to maintain a healthy body weight or to lose weight. Ask what an ideal weight is for you. Get at least 30 minutes of exercise that causes your heart to beat faster (aerobic exercise) most days of the week. Activities may include walking, swimming, or biking. Include exercise to strengthen your muscles (resistance exercise), such as Pilates or lifting weights, as part of your weekly exercise routine. Try to do these types of exercises for 30 minutes at least 3 days a week. Do not use any products that contain nicotine or tobacco. These products include cigarettes, chewing tobacco, and vaping devices, such as e-cigarettes. If you need help quitting, ask your health care provider. Monitor your blood pressure at home as told by your health care provider. Keep all follow-up visits. This is important. Medicines Take over-the-counter and prescription medicines only as told by your health care provider. Follow directions carefully. Blood  pressure medicines must be taken as prescribed. Do not skip doses of blood pressure medicine. Doing this puts you at risk for problems and can make the medicine less effective. Ask your health care provider about side effects or reactions to medicines that you should watch for. Contact a health care provider if you: Think you are having a reaction to a medicine you are taking. Have headaches that keep coming back (recurring). Feel dizzy. Have swelling in your ankles. Have trouble with your vision. Get help right away if you: Develop a severe headache or confusion. Have unusual weakness or numbness. Feel faint. Have severe pain in your chest or abdomen. Vomit repeatedly. Have trouble breathing. These symptoms may be an emergency. Get help right away. Call 911. Do not wait to see if the symptoms will go away. Do not drive yourself to the hospital. Summary Hypertension is when the force of blood pumping through your arteries is too strong. If this condition is not controlled, it may put you at risk for serious complications. Your personal target blood pressure may vary depending on your medical conditions, your age, and other factors. For most people, a normal blood pressure is less than 120/80. Hypertension is treated with lifestyle changes, medicines, or a combination of both. Lifestyle changes include losing weight, eating a healthy,   low-sodium diet, exercising more, and limiting alcohol. This information is not intended to replace advice given to you by your health care provider. Make sure you discuss any questions you have with your health care provider. Document Revised: 10/16/2021 Document Reviewed: 10/16/2021 Elsevier Patient Education  2023 [employer] Inc[employer]  

## 2022-05-08 NOTE — Assessment & Plan Note (Signed)
Stable and well-controlled.  Continue Symbicort twice a day. ?Sees pulmonary doctor on a regular basis. ?

## 2022-05-16 DIAGNOSIS — M9902 Segmental and somatic dysfunction of thoracic region: Secondary | ICD-10-CM | POA: Diagnosis not present

## 2022-05-16 DIAGNOSIS — M9901 Segmental and somatic dysfunction of cervical region: Secondary | ICD-10-CM | POA: Diagnosis not present

## 2022-05-16 DIAGNOSIS — M9903 Segmental and somatic dysfunction of lumbar region: Secondary | ICD-10-CM | POA: Diagnosis not present

## 2022-05-16 DIAGNOSIS — M62830 Muscle spasm of back: Secondary | ICD-10-CM | POA: Diagnosis not present

## 2022-05-21 DIAGNOSIS — M9903 Segmental and somatic dysfunction of lumbar region: Secondary | ICD-10-CM | POA: Diagnosis not present

## 2022-05-21 DIAGNOSIS — M9902 Segmental and somatic dysfunction of thoracic region: Secondary | ICD-10-CM | POA: Diagnosis not present

## 2022-05-21 DIAGNOSIS — M62830 Muscle spasm of back: Secondary | ICD-10-CM | POA: Diagnosis not present

## 2022-05-21 DIAGNOSIS — M9901 Segmental and somatic dysfunction of cervical region: Secondary | ICD-10-CM | POA: Diagnosis not present

## 2022-05-30 DIAGNOSIS — M62830 Muscle spasm of back: Secondary | ICD-10-CM | POA: Diagnosis not present

## 2022-05-30 DIAGNOSIS — M9903 Segmental and somatic dysfunction of lumbar region: Secondary | ICD-10-CM | POA: Diagnosis not present

## 2022-05-30 DIAGNOSIS — M9901 Segmental and somatic dysfunction of cervical region: Secondary | ICD-10-CM | POA: Diagnosis not present

## 2022-05-30 DIAGNOSIS — M9902 Segmental and somatic dysfunction of thoracic region: Secondary | ICD-10-CM | POA: Diagnosis not present

## 2022-06-05 ENCOUNTER — Ambulatory Visit (INDEPENDENT_AMBULATORY_CARE_PROVIDER_SITE_OTHER): Payer: Medicare Other | Admitting: Emergency Medicine

## 2022-06-05 ENCOUNTER — Encounter: Payer: Self-pay | Admitting: Emergency Medicine

## 2022-06-05 VITALS — BP 126/84 | HR 80 | Temp 97.8°F | Ht 68.0 in | Wt 186.4 lb

## 2022-06-05 DIAGNOSIS — I10 Essential (primary) hypertension: Secondary | ICD-10-CM

## 2022-06-05 NOTE — Assessment & Plan Note (Signed)
Well-controlled hypertension off medications. Advised to continue monitoring blood pressure readings at home daily for the next several weeks and keep a log. Diet and nutrition discussed. Cardiovascular risks associated with uncontrolled hypertension discussed. Follow-up in 3 months.

## 2022-06-05 NOTE — Progress Notes (Signed)
[name] 81 y.o.   Chief Complaint  Patient presents with   Follow-up    F/u blood pressure     HISTORY OF PRESENT ILLNESS: This is a 81 y.o. male here for follow-up of hypertension. Last office visit 05/08/2022 when he was started on amlodipine 5 mg daily. However he has not taken the medication.  He is try to control blood pressure with proper diet and nutrition.  States blood pressure readings at home have been within normal limits. No complaints or any other medical concerns today. BP Readings from Last 3 Encounters:  06/05/22 126/84  05/08/22 (!) 154/86  01/14/22 126/74     HPI   Prior to Admission medications   Medication Sig Start Date End Date Taking? Authorizing Provider  albuterol [facility] HFA) 108 (90 Base) MCG/ACT inhaler 2 puffs every 4 hours as needed for wheezing ((PLANB)) 05/08/20  Yes Martyn Ehrich, NP  albuterol (PROVENTIL) (2.5 MG/3ML) 0.083% nebulizer solution USE 1 VIAL IN NEBULIZER EVERY 4 HOURS AS NEEDED 07/20/21  Yes Tanda Rockers, MD  amLODipine (NORVASC) 5 MG tablet Take 1 tablet (5 mg total) by mouth daily. 05/08/22  Yes Garnet Overfield, Ines Bloomer, MD  Ascorbic Acid (VITAMIN C) 1000 MG tablet Take 1,000 mg by mouth daily.   Yes [provider]  aspirin 81 MG EC tablet Take 81 mg by mouth every morning.   Yes [provider]  budesonide-formoterol (SYMBICORT) 160-4.5 MCG/ACT inhaler TAKE 2 PUFFS FIRST THING IN AM AND THEN ANOTHER 2 PUFFS ABOUT 12 HOURS LATER. 03/20/22  Yes Tanda Rockers, MD  CHELATED MAGNESIUM PO Take 1 tablet by mouth daily.   Yes [provider]  cholecalciferol (VITAMIN D3) 25 MCG (1000 UT) tablet Take 1,000 Units by mouth daily.   Yes [provider]  cyanocobalamin 1000 MCG tablet Take 1,000 mcg by mouth daily.   Yes [provider]  famotidine (PEPCID) 20 MG tablet Take 20 mg by mouth daily.   Yes [provider]  guaifenesin (HUMIBID E) 400 MG TABS tablet Take 400 mg by  mouth every 4 (four) hours as needed.   Yes [provider]  Ibuprofen 200 MG CAPS Take 200 mg by mouth every 6 (six) hours as needed (headache/pain).    Yes [provider]  meclizine (ANTIVERT) 12.5 MG tablet TAKE 1 TABLET BY MOUTH 4 TIMES A DAY AS NEEDED FOR DIZZINESS 10/05/21  Yes Tanda Rockers, MD  montelukast (SINGULAIR) 10 MG tablet Take 1 tablet (10 mg total) by mouth at bedtime. 05/08/20  Yes Martyn Ehrich, NP  oxymetazoline (AFRIN) 0.05 % nasal spray Place 1 spray into both nostrils 2 (two) times daily as needed for congestion.   Yes [provider]  Polyvinyl Alcohol-Povidone (REFRESH OP) Apply 1 drop to eye as needed (dry eyes).    Yes [provider]  predniSONE (DELTASONE) 10 MG tablet 2 DAILY UNTIL BETTER, THEN 1 X 3 DAYS AND STOP 11/19/21  Yes Tanda Rockers, MD  Respiratory Therapy Supplies (FLUTTER) DEVI Use every 4 hours as needed for cough and congestion   Yes [provider]    No Known Allergies  Patient Active Problem List   Diagnosis Date Noted   Hx of adenomatous colonic polyps 06/25/2015   Esophageal stricture    Essential hypertension 09/05/2011   Hyperlipidemia 08/08/2011   Esophageal dysphagia 03/12/2011   GERD 11/06/2009   Seasonal and perennial allergic rhinitis 05/31/2009   Severe persistent asthma 01/04/2008  Past Medical History:  Diagnosis Date   Acid reflux    Asthma 06/18/2011   Borderline hypertension    Cataract    Colon polyps 2011   1 Hyperplastic and 1 Tubular Adenoma    Dysphagia    Esophageal stricture    GERD (gastroesophageal reflux disease)    Hx of adenomatous colonic polyps 06/25/2015   Personal history of colonic polyps 12/1989   Shingles    Shoulder pain, right    Unspecified asthma(493.90)    Urinary frequency     Past Surgical History:  Procedure Laterality Date   BALLOON DILATION N/A 02/07/2015   Procedure: BALLOON DILATION;  Surgeon: Gatha Mayer, MD;  Location: [facility]  ENDOSCOPY;  Service: Endoscopy;  Laterality: N/A;   ESOPHAGOGASTRODUODENOSCOPY (EGD) WITH PROPOFOL N/A 02/07/2015   Procedure: ESOPHAGOGASTRODUODENOSCOPY (EGD) WITH PROPOFOL;  Surgeon: Gatha Mayer, MD;  Location: [facility] END[facility];  Service: Endoscopy;  Laterality: N/A;   HEMORRHOID SURGERY     HERNIA REPAIR     MASS EXCISION N/A 08/14/2016   Procedure: EXCISION SEBACEOUS CYST BACK;  Surgeon: Johnathan Hausen, MD;  Location: [place];  Service: General;  Laterality: N/A;  EXCISION SEBACEOUS CYST BACK   TONSILLECTOMY      Social History   Socioeconomic History   Marital status: Married    Spouse name: Not on file   Number of children: 7   Years of education: Not on file   Highest education level: Not on file  Occupational History   Occupation: Truck Geophysicist/field seismologist    Comment: retired    Fish farm manager: RETIRED  Tobacco Use   Smoking status: Never   Smokeless tobacco: Never  Vaping Use   Vaping Use: Never used  Substance and Sexual Activity   Alcohol use: No   Drug use: No   Sexual activity: Not on file  Other Topics Concern   Not on file  Social History Narrative   Patient is separated from spouse   Social Determinants of Radio broadcast assistant Strain: Not on file  Food Insecurity: Not on file  Transportation Needs: Not on file  Physical Activity: Not on file  Stress: Not on file  Social Connections: Not on file  Intimate Partner Violence: Not on file    Family History  Problem Relation Age of Onset   Heart disease Mother    Kidney failure Sister    Breast cancer Sister    Lung cancer Brother    Brain cancer Sister    Asthma Sister    Colon cancer Neg Hx      Review of Systems  Constitutional: Negative.  Negative for chills and fever.  HENT: Negative.  Negative for congestion and sore throat.   Respiratory: Negative.  Negative for cough and shortness of breath.   Cardiovascular: Negative.  Negative for chest pain and palpitations.  Gastrointestinal:   Negative for abdominal pain, diarrhea, nausea and vomiting.  Genitourinary: Negative.   Skin: Negative.  Negative for rash.  Neurological: Negative.  Negative for dizziness and headaches.  All other systems reviewed and are negative.  Today's Vitals   06/05/22 1026  BP: 126/84  Pulse: 80  Temp: 97.8 F (36.6 C)  TempSrc: Oral  SpO2: 96%  Weight: 186 lb 6 oz (84.5 kg)  Height: '5\' 8"'$  (1.727 m)   Body mass index is 28.34 kg/m.   Physical Exam Vitals reviewed.  Constitutional:      Appearance: Normal appearance.  HENT:     Head: Normocephalic.  Eyes:     Extraocular Movements: Extraocular movements intact.     Pupils: Pupils are equal, round, and reactive to light.  Cardiovascular:     Rate and Rhythm: Normal rate and regular rhythm.     Pulses: Normal pulses.     Heart sounds: Normal heart sounds.  Pulmonary:     Effort: Pulmonary effort is normal.     Breath sounds: Normal breath sounds.  Musculoskeletal:     Cervical back: No tenderness.     Right lower leg: No edema.     Left lower leg: No edema.  Lymphadenopathy:     Cervical: No cervical adenopathy.  Skin:    General: Skin is warm and dry.     Capillary Refill: Capillary refill takes less than 2 seconds.  Neurological:     General: No focal deficit present.     Mental Status: He is alert and oriented to person, place, and time.  Psychiatric:        Mood and Affect: Mood normal.        Behavior: Behavior normal.      ASSESSMENT & PLAN: Problem List Items Addressed This Visit       Cardiovascular and Mediastinum   Essential hypertension - Primary    Well-controlled hypertension off medications. Advised to continue monitoring blood pressure readings at home daily for the next several weeks and keep a log. Diet and nutrition discussed. Cardiovascular risks associated with uncontrolled hypertension discussed. Follow-up in 3 months.      Patient Instructions  Hypertension, Adult High blood pressure  (hypertension) is when the force of blood pumping through the arteries is too strong. The arteries are the blood vessels that carry blood from the heart throughout the body. Hypertension forces the heart to work harder to pump blood and may cause arteries to become narrow or stiff. Untreated or uncontrolled hypertension can lead to a heart attack, heart failure, a stroke, kidney disease, and other problems. A blood pressure reading consists of a higher number over a lower number. Ideally, your blood pressure should be below 120/80. The first ("top") number is called the systolic pressure. It is a measure of the pressure in your arteries as your heart beats. The second ("bottom") number is called the diastolic pressure. It is a measure of the pressure in your arteries as the heart relaxes. What are the causes? The exact cause of this condition is not known. There are some conditions that result in high blood pressure. What increases the risk? Certain factors may make you more likely to develop high blood pressure. Some of these risk factors are under your control, including: Smoking. Not getting enough exercise or physical activity. Being overweight. Having too much fat, sugar, calories, or salt (sodium) in your diet. Drinking too much alcohol. Other risk factors include: Having a personal history of heart disease, diabetes, high cholesterol, or kidney disease. Stress. Having a family history of high blood pressure and high cholesterol. Having obstructive sleep apnea. Age. The risk increases with age. What are the signs or symptoms? High blood pressure may not cause symptoms. Very high blood pressure (hypertensive crisis) may cause: Headache. Fast or irregular heartbeats (palpitations). Shortness of breath. Nosebleed. Nausea and vomiting. Vision changes. Severe chest pain, dizziness, and seizures. How is this diagnosed? This condition is diagnosed by measuring your blood pressure while you  are seated, with your arm resting on a flat surface, your legs uncrossed, and your feet flat on the floor. The cuff of the  blood pressure monitor will be placed directly against the skin of your upper arm at the level of your heart. Blood pressure should be measured at least twice using the same arm. Certain conditions can cause a difference in blood pressure between your right and left arms. If you have a high blood pressure reading during one visit or you have normal blood pressure with other risk factors, you may be asked to: Return on a different day to have your blood pressure checked again. Monitor your blood pressure at home for 1 week or longer. If you are diagnosed with hypertension, you may have other blood or imaging tests to help your health care provider understand your overall risk for other conditions. How is this treated? This condition is treated by making healthy lifestyle changes, such as eating healthy foods, exercising more, and reducing your alcohol intake. You may be referred for counseling on a healthy diet and physical activity. Your health care provider may prescribe medicine if lifestyle changes are not enough to get your blood pressure under control and if: Your systolic blood pressure is above 130. Your diastolic blood pressure is above 80. Your personal target blood pressure may vary depending on your medical conditions, your age, and other factors. Follow these instructions at home: Eating and drinking  Eat a diet that is high in fiber and potassium, and low in sodium, added sugar, and fat. An example of this eating plan is called the DASH diet. DASH stands for Dietary Approaches to Stop Hypertension. To eat this way: Eat plenty of fresh fruits and vegetables. Try to fill one half of your plate at each meal with fruits and vegetables. Eat whole grains, such as whole-wheat pasta, brown rice, or whole-grain bread. Fill about one fourth of your plate with whole  grains. Eat or drink low-fat dairy products, such as skim milk or low-fat yogurt. Avoid fatty cuts of meat, processed or cured meats, and poultry with skin. Fill about one fourth of your plate with lean proteins, such as fish, chicken without skin, beans, eggs, or tofu. Avoid pre-made and processed foods. These tend to be higher in sodium, added sugar, and fat. Reduce your daily sodium intake. Many people with hypertension should eat less than 1,500 mg of sodium a day. Do not drink alcohol if: Your health care provider tells you not to drink. You are pregnant, may be pregnant, or are planning to become pregnant. If you drink alcohol: Limit how much you have to: 0-1 drink a day for women. 0-2 drinks a day for men. Know how much alcohol is in your drink. In the [place] one drink equals one 12 oz bottle of beer (355 mL), one 5 oz glass of wine (148 mL), or one 1 oz glass of hard liquor (44 mL). Lifestyle  Work with your health care provider to maintain a healthy body weight or to lose weight. Ask what an ideal weight is for you. Get at least 30 minutes of exercise that causes your heart to beat faster (aerobic exercise) most days of the week. Activities may include walking, swimming, or biking. Include exercise to strengthen your muscles (resistance exercise), such as Pilates or lifting weights, as part of your weekly exercise routine. Try to do these types of exercises for 30 minutes at least 3 days a week. Do not use any products that contain nicotine or tobacco. These products include cigarettes, chewing tobacco, and vaping devices, such as e-cigarettes. If you need help quitting, ask your health care  provider. Monitor your blood pressure at home as told by your health care provider. Keep all follow-up visits. This is important. Medicines Take over-the-counter and prescription medicines only as told by your health care provider. Follow directions carefully. Blood pressure medicines must be taken  as prescribed. Do not skip doses of blood pressure medicine. Doing this puts you at risk for problems and can make the medicine less effective. Ask your health care provider about side effects or reactions to medicines that you should watch for. Contact a health care provider if you: Think you are having a reaction to a medicine you are taking. Have headaches that keep coming back (recurring). Feel dizzy. Have swelling in your ankles. Have trouble with your vision. Get help right away if you: Develop a severe headache or confusion. Have unusual weakness or numbness. Feel faint. Have severe pain in your chest or abdomen. Vomit repeatedly. Have trouble breathing. These symptoms may be an emergency. Get help right away. Call 911. Do not wait to see if the symptoms will go away. Do not drive yourself to the hospital. Summary Hypertension is when the force of blood pumping through your arteries is too strong. If this condition is not controlled, it may put you at risk for serious complications. Your personal target blood pressure may vary depending on your medical conditions, your age, and other factors. For most people, a normal blood pressure is less than 120/80. Hypertension is treated with lifestyle changes, medicines, or a combination of both. Lifestyle changes include losing weight, eating a healthy, low-sodium diet, exercising more, and limiting alcohol. This information is not intended to replace advice given to you by your health care provider. Make sure you discuss any questions you have with your health care provider. Document Revised: 10/16/2021 Document Reviewed: 10/16/2021 Elsevier Patient Education  Martin, MD [facility]

## 2022-06-05 NOTE — Patient Instructions (Signed)
Hypertension, Adult High blood pressure (hypertension) is when the force of blood pumping through the arteries is too strong. The arteries are the blood vessels that carry blood from the heart throughout the body. Hypertension forces the heart to work harder to pump blood and may cause arteries to become narrow or stiff. Untreated or uncontrolled hypertension can lead to a heart attack, heart failure, a stroke, kidney disease, and other problems. A blood pressure reading consists of a higher number over a lower number. Ideally, your blood pressure should be below 120/80. The first ("top") number is called the systolic pressure. It is a measure of the pressure in your arteries as your heart beats. The second ("bottom") number is called the diastolic pressure. It is a measure of the pressure in your arteries as the heart relaxes. What are the causes? The exact cause of this condition is not known. There are some conditions that result in high blood pressure. What increases the risk? Certain factors may make you more likely to develop high blood pressure. Some of these risk factors are under your control, including: Smoking. Not getting enough exercise or physical activity. Being overweight. Having too much fat, sugar, calories, or salt (sodium) in your diet. Drinking too much alcohol. Other risk factors include: Having a personal history of heart disease, diabetes, high cholesterol, or kidney disease. Stress. Having a family history of high blood pressure and high cholesterol. Having obstructive sleep apnea. Age. The risk increases with age. What are the signs or symptoms? High blood pressure may not cause symptoms. Very high blood pressure (hypertensive crisis) may cause: Headache. Fast or irregular heartbeats (palpitations). Shortness of breath. Nosebleed. Nausea and vomiting. Vision changes. Severe chest pain, dizziness, and seizures. How is this diagnosed? This condition is diagnosed by  measuring your blood pressure while you are seated, with your arm resting on a flat surface, your legs uncrossed, and your feet flat on the floor. The cuff of the blood pressure monitor will be placed directly against the skin of your upper arm at the level of your heart. Blood pressure should be measured at least twice using the same arm. Certain conditions can cause a difference in blood pressure between your right and left arms. If you have a high blood pressure reading during one visit or you have normal blood pressure with other risk factors, you may be asked to: Return on a different day to have your blood pressure checked again. Monitor your blood pressure at home for 1 week or longer. If you are diagnosed with hypertension, you may have other blood or imaging tests to help your health care provider understand your overall risk for other conditions. How is this treated? This condition is treated by making healthy lifestyle changes, such as eating healthy foods, exercising more, and reducing your alcohol intake. You may be referred for counseling on a healthy diet and physical activity. Your health care provider may prescribe medicine if lifestyle changes are not enough to get your blood pressure under control and if: Your systolic blood pressure is above 130. Your diastolic blood pressure is above 80. Your personal target blood pressure may vary depending on your medical conditions, your age, and other factors. Follow these instructions at home: Eating and drinking  Eat a diet that is high in fiber and potassium, and low in sodium, added sugar, and fat. An example of this eating plan is called the DASH diet. DASH stands for Dietary Approaches to Stop Hypertension. To eat this way: Eat   plenty of fresh fruits and vegetables. Try to fill one half of your plate at each meal with fruits and vegetables. Eat whole grains, such as whole-wheat pasta, brown rice, or whole-grain bread. Fill about one  fourth of your plate with whole grains. Eat or drink low-fat dairy products, such as skim milk or low-fat yogurt. Avoid fatty cuts of meat, processed or cured meats, and poultry with skin. Fill about one fourth of your plate with lean proteins, such as fish, chicken without skin, beans, eggs, or tofu. Avoid pre-made and processed foods. These tend to be higher in sodium, added sugar, and fat. Reduce your daily sodium intake. Many people with hypertension should eat less than 1,500 mg of sodium a day. Do not drink alcohol if: Your health care provider tells you not to drink. You are pregnant, may be pregnant, or are planning to become pregnant. If you drink alcohol: Limit how much you have to: 0-1 drink a day for women. 0-2 drinks a day for men. Know how much alcohol is in your drink. In the [place] one drink equals one 12 oz bottle of beer (355 mL), one 5 oz glass of wine (148 mL), or one 1 oz glass of hard liquor (44 mL). Lifestyle  Work with your health care provider to maintain a healthy body weight or to lose weight. Ask what an ideal weight is for you. Get at least 30 minutes of exercise that causes your heart to beat faster (aerobic exercise) most days of the week. Activities may include walking, swimming, or biking. Include exercise to strengthen your muscles (resistance exercise), such as Pilates or lifting weights, as part of your weekly exercise routine. Try to do these types of exercises for 30 minutes at least 3 days a week. Do not use any products that contain nicotine or tobacco. These products include cigarettes, chewing tobacco, and vaping devices, such as e-cigarettes. If you need help quitting, ask your health care provider. Monitor your blood pressure at home as told by your health care provider. Keep all follow-up visits. This is important. Medicines Take over-the-counter and prescription medicines only as told by your health care provider. Follow directions carefully. Blood  pressure medicines must be taken as prescribed. Do not skip doses of blood pressure medicine. Doing this puts you at risk for problems and can make the medicine less effective. Ask your health care provider about side effects or reactions to medicines that you should watch for. Contact a health care provider if you: Think you are having a reaction to a medicine you are taking. Have headaches that keep coming back (recurring). Feel dizzy. Have swelling in your ankles. Have trouble with your vision. Get help right away if you: Develop a severe headache or confusion. Have unusual weakness or numbness. Feel faint. Have severe pain in your chest or abdomen. Vomit repeatedly. Have trouble breathing. These symptoms may be an emergency. Get help right away. Call 911. Do not wait to see if the symptoms will go away. Do not drive yourself to the hospital. Summary Hypertension is when the force of blood pumping through your arteries is too strong. If this condition is not controlled, it may put you at risk for serious complications. Your personal target blood pressure may vary depending on your medical conditions, your age, and other factors. For most people, a normal blood pressure is less than 120/80. Hypertension is treated with lifestyle changes, medicines, or a combination of both. Lifestyle changes include losing weight, eating a healthy,   low-sodium diet, exercising more, and limiting alcohol. This information is not intended to replace advice given to you by your health care provider. Make sure you discuss any questions you have with your health care provider. Document Revised: 10/16/2021 Document Reviewed: 10/16/2021 Elsevier Patient Education  2023 [employer] Inc[employer]  

## 2022-06-17 DIAGNOSIS — M9903 Segmental and somatic dysfunction of lumbar region: Secondary | ICD-10-CM | POA: Diagnosis not present

## 2022-06-17 DIAGNOSIS — M9902 Segmental and somatic dysfunction of thoracic region: Secondary | ICD-10-CM | POA: Diagnosis not present

## 2022-06-17 DIAGNOSIS — M62830 Muscle spasm of back: Secondary | ICD-10-CM | POA: Diagnosis not present

## 2022-06-17 DIAGNOSIS — M9901 Segmental and somatic dysfunction of cervical region: Secondary | ICD-10-CM | POA: Diagnosis not present

## 2022-06-19 NOTE — Telephone Encounter (Signed)
Pt was seen by new PCP after he called the office. Nothing further needed.

## 2022-07-04 DIAGNOSIS — M9902 Segmental and somatic dysfunction of thoracic region: Secondary | ICD-10-CM | POA: Diagnosis not present

## 2022-07-04 DIAGNOSIS — M9901 Segmental and somatic dysfunction of cervical region: Secondary | ICD-10-CM | POA: Diagnosis not present

## 2022-07-04 DIAGNOSIS — M9903 Segmental and somatic dysfunction of lumbar region: Secondary | ICD-10-CM | POA: Diagnosis not present

## 2022-07-04 DIAGNOSIS — M62830 Muscle spasm of back: Secondary | ICD-10-CM | POA: Diagnosis not present

## 2022-07-11 DIAGNOSIS — M9902 Segmental and somatic dysfunction of thoracic region: Secondary | ICD-10-CM | POA: Diagnosis not present

## 2022-07-11 DIAGNOSIS — M62830 Muscle spasm of back: Secondary | ICD-10-CM | POA: Diagnosis not present

## 2022-07-11 DIAGNOSIS — M9901 Segmental and somatic dysfunction of cervical region: Secondary | ICD-10-CM | POA: Diagnosis not present

## 2022-07-11 DIAGNOSIS — M9903 Segmental and somatic dysfunction of lumbar region: Secondary | ICD-10-CM | POA: Diagnosis not present

## 2022-07-16 ENCOUNTER — Ambulatory Visit (INDEPENDENT_AMBULATORY_CARE_PROVIDER_SITE_OTHER): Payer: Medicare Other | Admitting: Internal Medicine

## 2022-07-16 ENCOUNTER — Encounter: Payer: Self-pay | Admitting: Internal Medicine

## 2022-07-16 DIAGNOSIS — J4550 Severe persistent asthma, uncomplicated: Secondary | ICD-10-CM

## 2022-07-16 MED ORDER — AZITHROMYCIN 250 MG PO TABS: ORAL_TABLET | ORAL | 11 refills | Status: DC

## 2022-07-16 NOTE — Progress Notes (Signed)
Subjective:   Patient ID: [name]    DOB: 08/07/1941     Brief patient profile: 25 yobm denies ever smoking but has lifelong asthma with severe chronic pattern and best FEV1 of around 2.2 liters documented 2002 and also documented non-adherence  followed in pulmonary for primary care also for hbp and hyperlipidemia.   History of Present Illness    07/30/13 Allergy eval rx completed x 4 years > d/c'd and f/u allergy prn    04/03/2015 Follow up : chronic asthma/ HTN/non adherence / relies on samples/ no formulary / has med calendar  Pt returns for 1 month follow up .  Doing well with no asthma flare , on Dulera . Marland Kitchen  Need to establish with dentist > multiple teeth extracted  06/04/16 Dr [name] rec [place] > could not afford deductible     01/14/2022  f/u ov/[name] re: severe chronic asthma maint on symbicocrt 160 2bid  pred as plan D Chief Complaint  Patient presents with   Follow-up  Dyspnea:  neighborhood walking up some hills ok / slower than others  Cough: none Sleeping: no resp cc  SABA use: not using  02: none  Covid status:   declined  Rec I will be referring you again to [facility]  No change in medications     07/16/2022  f/u ov/[name] re: severe chronic asthma maint on symbicort  160 / no recent pred  needed  Chief Complaint  Patient presents with   Follow-up    Pt states his asthma is still the same without any asthma attacks.    Dyspnea:  slow up hills no change ex tol  Cough: none  Sleeping: flat bed / 2 pillows  SABA use: none  02: none  Covid status:  never had any vax    No obvious day to day or daytime variability or assoc excess/ purulent sputum or mucus plugs or hemoptysis or cp or chest tightness, subjective wheeze or overt sinus or hb symptoms.   Sleeping  without nocturnal  or early am exacerbation  of respiratory  c/o's or need for noct saba. Also denies any obvious fluctuation of symptoms with weather or environmental changes or  other aggravating or alleviating factors except as outlined above   No unusual exposure hx or h/o childhood pna  or knowledge of premature birth.  Current Allergies, Complete Past Medical History, Past Surgical History, Family History, and Social History were reviewed in [employer] record.  ROS  The following are not active complaints unless bolded Hoarseness, sore throat, dysphagia, dental problems, itching, sneezing,  nasal congestion or discharge of excess mucus or purulent secretions, ear ache,   fever, chills, sweats, unintended wt loss or wt gain, classically pleuritic or exertional cp,  orthopnea pnd or arm/hand swelling  or leg swelling, presyncope, palpitations, abdominal pain, anorexia, nausea, vomiting, diarrhea  or change in bowel habits or change in bladder habits, change in stools or change in urine, dysuria, hematuria,  rash, arthralgias, visual complaints, headache, numbness, weakness or ataxia or problems with walking or coordination,  change in mood or  memory.        Current Meds  Medication Sig   albuterol (PROAIR HFA) 108 (90 Base) MCG/ACT inhaler 2 puffs every 4 hours as needed for wheezing ((PLANB))   albuterol (PROVENTIL) (2.5 MG/3ML) 0.083% nebulizer solution USE 1 VIAL IN NEBULIZER EVERY 4 HOURS AS NEEDED   amLODipine (NORVASC) 5 MG tablet Take 1 tablet (5 mg total)  by mouth daily.   Ascorbic Acid (VITAMIN C) 1000 MG tablet Take 1,000 mg by mouth daily.   aspirin 81 MG EC tablet Take 81 mg by mouth every morning.   budesonide-formoterol (SYMBICORT) 160-4.5 MCG/ACT inhaler TAKE 2 PUFFS FIRST THING IN AM AND THEN ANOTHER 2 PUFFS ABOUT 12 HOURS LATER.   CHELATED MAGNESIUM PO Take 1 tablet by mouth daily.   cholecalciferol (VITAMIN D3) 25 MCG (1000 UT) tablet Take 1,000 Units by mouth daily.   cyanocobalamin 1000 MCG tablet Take 1,000 mcg by mouth daily.   famotidine (PEPCID) 20 MG tablet Take 20 mg by mouth daily.   guaifenesin (HUMIBID E) 400 MG  TABS tablet Take 400 mg by mouth every 4 (four) hours as needed.   Ibuprofen 200 MG CAPS Take 200 mg by mouth every 6 (six) hours as needed (headache/pain).    meclizine (ANTIVERT) 12.5 MG tablet TAKE 1 TABLET BY MOUTH 4 TIMES A DAY AS NEEDED FOR DIZZINESS   oxymetazoline (AFRIN) 0.05 % nasal spray Place 1 spray into both nostrils 2 (two) times daily as needed for congestion.   Polyvinyl Alcohol-Povidone (REFRESH OP) Apply 1 drop to eye as needed (dry eyes).    predniSONE (DELTASONE) 10 MG tablet 2 DAILY UNTIL BETTER, THEN 1 X 3 DAYS AND STOP   Respiratory Therapy Supplies (FLUTTER) DEVI Use every 4 hours as needed for cough and congestion                 Past Medical Hx ASTHMA (ICD-493.90)  --chronic severe asthma with best FEV1 2.2 L documented 01/2001 and  documented non-adherence Mainly lives off samples of inhalers and nose sprays  - HFA 75% June 15, 2009 > 90% August 02, 2009 > 100% December 27, 2009  - Off allergy vaccines x7/2010 > highly allergic skin tests per Dr [name] > restarted vaccine November 06, 2009 > stopped  07/2013  SHOULDER PAIN, RIGHT (ICD-719.41)   HYPERTENSION, BORDERLINE (ICD-401.9)  Shingles T 8 Left 08/2000 HEALTH MAINTENANCE...............................................................................................Marland KitchenWert  - Colonoscopy 12/1989,  2011  - dT 07/25/2014  - Pneumovax 02/2003 and 05/2009 - Prevnar 07/25/14 -Flu declined October 17, 2008 and September 21, 2009 , declined flu shot 2015, 2017  -CPX 07/21/2019            Objective:  Physical Exam    Wts  07/16/2022     185   01/14/2022     183   07/20/2021    183  11/15/2020  184  08/08/2020   187 01/25/2020      193  09/29/2019    194  05/23/2014  201> 201 07/04/2014 > 07/25/14  196>198 08/22/2014 >194 09/19/2014 >198 10/11/2014 > 10/24/2014  196>   01/02/2015 193 > 03/06/2015  198 >198 04/03/2015 > 05/16/2015 196 >  06/02/2015 196 >197 06/16/2015 > 12/12/2015 199> 197 01/08/2016 >   02/16/2016     194 > 04/08/2016   193   Vital signs reviewed  07/16/2022  - Note at rest 02 sats  95% on RA   General appearance:    amb stoic bm nad   HEENT :  Oropharynx  clear   Nasal turbinates nl    NECK :  without JVD/Nodes/TM/ nl carotid upstrokes bilaterally   LUNGS: no acc muscle use,  Mod barrel  contour chest wall with bilateral  Distant bs s audible wheeze and  without cough on insp or exp maneuvers and mod  Hyperresonant  to  percussion bilaterally     CV:  RRR  no s3 or murmur or increase in P2, and no edema   ABD:  soft and nontender with pos mid insp Hoover's  in the supine position. No bruits or organomegaly appreciated, bowel sounds nl  MS:   Ext warm without deformities or   obvious joint restrictions , calf tenderness, cyanosis or clubbing  SKIN: warm and dry without lesions    NEURO:  alert, approp, nl sensorium with  no motor or cerebellar deficits apparent.

## 2022-07-16 NOTE — Assessment & Plan Note (Signed)
Onset in childhood  --chronic severe asthma with best FEV1 2.2 L documented 01/2001 and documented non-adherence Mainly lives off samples of inhalers and nose sprays  - Off allergy vaccines x7/2010 > highly allergic skin tests per Dr Annamaria Boots > restart vaccine November 06, 2009>stop 07/30/13 to watch - Spirometry on "good day" 01/14/12 =  FEV1 1.54  Ratio 0.58  - 02/16/2016 added prn prednisone when needing neb - FENO 04/23/16- 32  -CBC with differential 04/23/2016-eosinophils elevated 7%  - Spirometry 01/28/2017  FEV1 1.23 (49%)  Ratio 60    - Spirometry 08/11/2018  FEV1 1.43 (60%)  Ratio 60  - FENO 08/11/2018  =   30 on symb 160 2bid - 07/16/2022  After extensive coaching inhaler device,  effectiveness =   75% (delayed insp)   All goals of chronic asthma control met including optimal  (though certainly not nl) function and elimination of symptoms with minimal need for rescue therapy.  Contingencies discussed in full including contacting this office immediately if not controlling the symptoms using the rule of two's.    Has prednisone as backup and not needing recently so no change rx needed   F/u q 6 m, sooner prn         Each maintenance medication was reviewed in detail including emphasizing most importantly the difference between maintenance and prns and under what circumstances the prns are to be triggered using an action plan format where appropriate.  Total time for H and P, chart review, counseling, reviewing hfa  device(s) and generating customized AVS unique to this office visit / same day charting = 86mn

## 2022-07-16 NOTE — Patient Instructions (Signed)
For nasty mucus > zpak   No changer in medications  Please schedule a follow up visit in 6 months but call sooner if needed

## 2022-08-01 DIAGNOSIS — M62830 Muscle spasm of back: Secondary | ICD-10-CM | POA: Diagnosis not present

## 2022-08-01 DIAGNOSIS — M9901 Segmental and somatic dysfunction of cervical region: Secondary | ICD-10-CM | POA: Diagnosis not present

## 2022-08-01 DIAGNOSIS — M9903 Segmental and somatic dysfunction of lumbar region: Secondary | ICD-10-CM | POA: Diagnosis not present

## 2022-08-01 DIAGNOSIS — M9902 Segmental and somatic dysfunction of thoracic region: Secondary | ICD-10-CM | POA: Diagnosis not present

## 2022-08-06 ENCOUNTER — Other Ambulatory Visit: Payer: Self-pay | Admitting: Primary Care

## 2022-08-06 DIAGNOSIS — M9903 Segmental and somatic dysfunction of lumbar region: Secondary | ICD-10-CM | POA: Diagnosis not present

## 2022-08-06 DIAGNOSIS — M62830 Muscle spasm of back: Secondary | ICD-10-CM | POA: Diagnosis not present

## 2022-08-06 DIAGNOSIS — M9901 Segmental and somatic dysfunction of cervical region: Secondary | ICD-10-CM | POA: Diagnosis not present

## 2022-08-06 DIAGNOSIS — M9902 Segmental and somatic dysfunction of thoracic region: Secondary | ICD-10-CM | POA: Diagnosis not present

## 2022-08-09 DIAGNOSIS — M62830 Muscle spasm of back: Secondary | ICD-10-CM | POA: Diagnosis not present

## 2022-08-09 DIAGNOSIS — M9902 Segmental and somatic dysfunction of thoracic region: Secondary | ICD-10-CM | POA: Diagnosis not present

## 2022-08-09 DIAGNOSIS — M9901 Segmental and somatic dysfunction of cervical region: Secondary | ICD-10-CM | POA: Diagnosis not present

## 2022-08-09 DIAGNOSIS — M9903 Segmental and somatic dysfunction of lumbar region: Secondary | ICD-10-CM | POA: Diagnosis not present

## 2022-08-12 DIAGNOSIS — M9902 Segmental and somatic dysfunction of thoracic region: Secondary | ICD-10-CM | POA: Diagnosis not present

## 2022-08-12 DIAGNOSIS — M9901 Segmental and somatic dysfunction of cervical region: Secondary | ICD-10-CM | POA: Diagnosis not present

## 2022-08-12 DIAGNOSIS — M9903 Segmental and somatic dysfunction of lumbar region: Secondary | ICD-10-CM | POA: Diagnosis not present

## 2022-08-12 DIAGNOSIS — M62830 Muscle spasm of back: Secondary | ICD-10-CM | POA: Diagnosis not present

## 2022-08-30 DIAGNOSIS — M9902 Segmental and somatic dysfunction of thoracic region: Secondary | ICD-10-CM | POA: Diagnosis not present

## 2022-08-30 DIAGNOSIS — M9903 Segmental and somatic dysfunction of lumbar region: Secondary | ICD-10-CM | POA: Diagnosis not present

## 2022-08-30 DIAGNOSIS — M9901 Segmental and somatic dysfunction of cervical region: Secondary | ICD-10-CM | POA: Diagnosis not present

## 2022-08-30 DIAGNOSIS — M62830 Muscle spasm of back: Secondary | ICD-10-CM | POA: Diagnosis not present

## 2022-09-02 ENCOUNTER — Ambulatory Visit (INDEPENDENT_AMBULATORY_CARE_PROVIDER_SITE_OTHER): Payer: Medicare Other | Admitting: Emergency Medicine

## 2022-09-02 ENCOUNTER — Encounter: Payer: Self-pay | Admitting: Emergency Medicine

## 2022-09-02 VITALS — BP 138/68 | HR 70 | Temp 97.9°F | Ht 66.0 in | Wt 186.0 lb

## 2022-09-02 DIAGNOSIS — N1831 Chronic kidney disease, stage 3a: Secondary | ICD-10-CM

## 2022-09-02 DIAGNOSIS — J4550 Severe persistent asthma, uncomplicated: Secondary | ICD-10-CM

## 2022-09-02 DIAGNOSIS — E785 Hyperlipidemia, unspecified: Secondary | ICD-10-CM

## 2022-09-02 DIAGNOSIS — I10 Essential (primary) hypertension: Secondary | ICD-10-CM

## 2022-09-02 LAB — CBC WITH DIFFERENTIAL/PLATELET
Basophils Absolute: 0 10*3/uL (ref 0.0–0.1)
Basophils Relative: 0.4 % (ref 0.0–3.0)
Eosinophils Absolute: 0.6 10*3/uL (ref 0.0–0.7)
Eosinophils Relative: 8.2 % — ABNORMAL HIGH (ref 0.0–5.0)
HCT: 42.5 % (ref 39.0–52.0)
Hemoglobin: 14.2 g/dL (ref 13.0–17.0)
Lymphocytes Relative: 43.1 % (ref 12.0–46.0)
Lymphs Abs: 3.1 10*3/uL (ref 0.7–4.0)
MCHC: 33.3 g/dL (ref 30.0–36.0)
MCV: 89.7 fl (ref 78.0–100.0)
Monocytes Absolute: 0.5 10*3/uL (ref 0.1–1.0)
Monocytes Relative: 6.5 % (ref 3.0–12.0)
Neutro Abs: 3 10*3/uL (ref 1.4–7.7)
Neutrophils Relative %: 41.8 % — ABNORMAL LOW (ref 43.0–77.0)
Platelets: 354 10*3/uL (ref 150.0–400.0)
RBC: 4.74 Mil/uL (ref 4.22–5.81)
RDW: 14 % (ref 11.5–15.5)
WBC: 7.1 10*3/uL (ref 4.0–10.5)

## 2022-09-02 LAB — COMPREHENSIVE METABOLIC PANEL
ALT: 12 U/L (ref 0–53)
AST: 17 U/L (ref 0–37)
Albumin: 4 g/dL (ref 3.5–5.2)
Alkaline Phosphatase: 72 U/L (ref 39–117)
BUN: 13 mg/dL (ref 6–23)
CO2: 27 mEq/L (ref 19–32)
Calcium: 9.3 mg/dL (ref 8.4–10.5)
Chloride: 107 mEq/L (ref 96–112)
Creatinine, Ser: 1.4 mg/dL (ref 0.40–1.50)
GFR: 46.87 mL/min — ABNORMAL LOW (ref >60.00)
Glucose, Bld: 89 mg/dL (ref 70–99)
Potassium: 4.1 mEq/L (ref 3.5–5.1)
Sodium: 140 mEq/L (ref 135–145)
Total Bilirubin: 0.9 mg/dL (ref 0.2–1.2)
Total Protein: 7 g/dL (ref 6.0–8.3)

## 2022-09-02 LAB — LIPID PANEL
Cholesterol: 195 mg/dL (ref 0–200)
HDL: 45.6 mg/dL (ref >39.00)
LDL Cholesterol: 122 mg/dL — ABNORMAL HIGH (ref 0–99)
NonHDL: 149.36
Total CHOL/HDL Ratio: 4
Triglycerides: 139 mg/dL (ref 0.0–149.0)
VLDL: 27.8 mg/dL (ref 0.0–40.0)

## 2022-09-02 MED ORDER — ALBUTEROL SULFATE (2.5 MG/3ML) 0.083% IN NEBU: INHALATION_SOLUTION | RESPIRATORY_TRACT | 5 refills | Status: AC

## 2022-09-02 NOTE — Patient Instructions (Signed)
Hypertension, Adult High blood pressure (hypertension) is when the force of blood pumping through the arteries is too strong. The arteries are the blood vessels that carry blood from the heart throughout the body. Hypertension forces the heart to work harder to pump blood and may cause arteries to become narrow or stiff. Untreated or uncontrolled hypertension can lead to a heart attack, heart failure, a stroke, kidney disease, and other problems. A blood pressure reading consists of a higher number over a lower number. Ideally, your blood pressure should be below 120/80. The first ("top") number is called the systolic pressure. It is a measure of the pressure in your arteries as your heart beats. The second ("bottom") number is called the diastolic pressure. It is a measure of the pressure in your arteries as the heart relaxes. What are the causes? The exact cause of this condition is not known. There are some conditions that result in high blood pressure. What increases the risk? Certain factors may make you more likely to develop high blood pressure. Some of these risk factors are under your control, including: Smoking. Not getting enough exercise or physical activity. Being overweight. Having too much fat, sugar, calories, or salt (sodium) in your diet. Drinking too much alcohol. Other risk factors include: Having a personal history of heart disease, diabetes, high cholesterol, or kidney disease. Stress. Having a family history of high blood pressure and high cholesterol. Having obstructive sleep apnea. Age. The risk increases with age. What are the signs or symptoms? High blood pressure may not cause symptoms. Very high blood pressure (hypertensive crisis) may cause: Headache. Fast or irregular heartbeats (palpitations). Shortness of breath. Nosebleed. Nausea and vomiting. Vision changes. Severe chest pain, dizziness, and seizures. How is this diagnosed? This condition is diagnosed by  measuring your blood pressure while you are seated, with your arm resting on a flat surface, your legs uncrossed, and your feet flat on the floor. The cuff of the blood pressure monitor will be placed directly against the skin of your upper arm at the level of your heart. Blood pressure should be measured at least twice using the same arm. Certain conditions can cause a difference in blood pressure between your right and left arms. If you have a high blood pressure reading during one visit or you have normal blood pressure with other risk factors, you may be asked to: Return on a different day to have your blood pressure checked again. Monitor your blood pressure at home for 1 week or longer. If you are diagnosed with hypertension, you may have other blood or imaging tests to help your health care provider understand your overall risk for other conditions. How is this treated? This condition is treated by making healthy lifestyle changes, such as eating healthy foods, exercising more, and reducing your alcohol intake. You may be referred for counseling on a healthy diet and physical activity. Your health care provider may prescribe medicine if lifestyle changes are not enough to get your blood pressure under control and if: Your systolic blood pressure is above 130. Your diastolic blood pressure is above 80. Your personal target blood pressure may vary depending on your medical conditions, your age, and other factors. Follow these instructions at home: Eating and drinking  Eat a diet that is high in fiber and potassium, and low in sodium, added sugar, and fat. An example of this eating plan is called the DASH diet. DASH stands for Dietary Approaches to Stop Hypertension. To eat this way: Eat   plenty of fresh fruits and vegetables. Try to fill one half of your plate at each meal with fruits and vegetables. Eat whole grains, such as whole-wheat pasta, brown rice, or whole-grain bread. Fill about one  fourth of your plate with whole grains. Eat or drink low-fat dairy products, such as skim milk or low-fat yogurt. Avoid fatty cuts of meat, processed or cured meats, and poultry with skin. Fill about one fourth of your plate with lean proteins, such as fish, chicken without skin, beans, eggs, or tofu. Avoid pre-made and processed foods. These tend to be higher in sodium, added sugar, and fat. Reduce your daily sodium intake. Many people with hypertension should eat less than 1,500 mg of sodium a day. Do not drink alcohol if: Your health care provider tells you not to drink. You are pregnant, may be pregnant, or are planning to become pregnant. If you drink alcohol: Limit how much you have to: 0-1 drink a day for women. 0-2 drinks a day for men. Know how much alcohol is in your drink. In the [place] one drink equals one 12 oz bottle of beer (355 mL), one 5 oz glass of wine (148 mL), or one 1 oz glass of hard liquor (44 mL). Lifestyle  Work with your health care provider to maintain a healthy body weight or to lose weight. Ask what an ideal weight is for you. Get at least 30 minutes of exercise that causes your heart to beat faster (aerobic exercise) most days of the week. Activities may include walking, swimming, or biking. Include exercise to strengthen your muscles (resistance exercise), such as Pilates or lifting weights, as part of your weekly exercise routine. Try to do these types of exercises for 30 minutes at least 3 days a week. Do not use any products that contain nicotine or tobacco. These products include cigarettes, chewing tobacco, and vaping devices, such as e-cigarettes. If you need help quitting, ask your health care provider. Monitor your blood pressure at home as told by your health care provider. Keep all follow-up visits. This is important. Medicines Take over-the-counter and prescription medicines only as told by your health care provider. Follow directions carefully. Blood  pressure medicines must be taken as prescribed. Do not skip doses of blood pressure medicine. Doing this puts you at risk for problems and can make the medicine less effective. Ask your health care provider about side effects or reactions to medicines that you should watch for. Contact a health care provider if you: Think you are having a reaction to a medicine you are taking. Have headaches that keep coming back (recurring). Feel dizzy. Have swelling in your ankles. Have trouble with your vision. Get help right away if you: Develop a severe headache or confusion. Have unusual weakness or numbness. Feel faint. Have severe pain in your chest or abdomen. Vomit repeatedly. Have trouble breathing. These symptoms may be an emergency. Get help right away. Call 911. Do not wait to see if the symptoms will go away. Do not drive yourself to the hospital. Summary Hypertension is when the force of blood pumping through your arteries is too strong. If this condition is not controlled, it may put you at risk for serious complications. Your personal target blood pressure may vary depending on your medical conditions, your age, and other factors. For most people, a normal blood pressure is less than 120/80. Hypertension is treated with lifestyle changes, medicines, or a combination of both. Lifestyle changes include losing weight, eating a healthy,   low-sodium diet, exercising more, and limiting alcohol. This information is not intended to replace advice given to you by your health care provider. Make sure you discuss any questions you have with your health care provider. Document Revised: 10/16/2021 Document Reviewed: 10/16/2021 Elsevier Patient Education  2023 [employer] Inc[employer]  

## 2022-09-02 NOTE — Assessment & Plan Note (Signed)
Well-controlled hypertension.  Started amlodipine. Continue amlodipine 5 mg daily. Cardiovascular risks associated with hypertension discussed.

## 2022-09-02 NOTE — Assessment & Plan Note (Signed)
Stable.  Diet and nutrition discussed. 

## 2022-09-02 NOTE — Assessment & Plan Note (Signed)
Well-controlled. Sees pulmonary doctor on a regular basis. Continue Symbicort twice a day. Albuterol as rescue inhaler.

## 2022-09-02 NOTE — Progress Notes (Signed)
[name] 81 y.o.   Chief Complaint  Patient presents with   Follow-up    No concerns    HISTORY OF PRESENT ILLNESS: This is a 81 y.o. male with history of hypertension and asthma here for follow-up. Started taking amlodipine 5 mg daily. Has no complaints or medical concerns today. BP Readings from Last 3 Encounters:  09/02/22 138/68  07/16/22 132/74  06/05/22 126/84     HPI   Prior to Admission medications   Medication Sig Start Date End Date Taking? Authorizing Provider  albuterol (PROVENTIL) (2.5 MG/3ML) 0.083% nebulizer solution USE 1 VIAL IN NEBULIZER EVERY 4 HOURS AS NEEDED 07/20/21  Yes Tanda Rockers, MD  amLODipine (NORVASC) 5 MG tablet Take 1 tablet (5 mg total) by mouth daily. 05/08/22  Yes Dotti Busey, Ines Bloomer, MD  Ascorbic Acid (VITAMIN C) 1000 MG tablet Take 1,000 mg by mouth daily.   Yes [provider]  aspirin 81 MG EC tablet Take 81 mg by mouth every morning.   Yes [provider]  azithromycin (ZITHROMAX) 250 MG tablet 2 on 1st day and one daily x 5 days 07/16/22  Yes Tanda Rockers, MD  budesonide-formoterol (SYMBICORT) 160-4.5 MCG/ACT inhaler TAKE 2 PUFFS FIRST THING IN AM AND THEN ANOTHER 2 PUFFS ABOUT 12 HOURS LATER. 03/20/22  Yes Tanda Rockers, MD  CHELATED MAGNESIUM PO Take 1 tablet by mouth daily.   Yes [provider]  cholecalciferol (VITAMIN D3) 25 MCG (1000 UT) tablet Take 1,000 Units by mouth daily.   Yes [provider]  cyanocobalamin 1000 MCG tablet Take 1,000 mcg by mouth daily.   Yes [provider]  famotidine (PEPCID) 20 MG tablet Take 20 mg by mouth daily.   Yes [provider]  guaifenesin (HUMIBID E) 400 MG TABS tablet Take 400 mg by mouth every 4 (four) hours as needed.   Yes [provider]  Ibuprofen 200 MG CAPS Take 200 mg by mouth every 6 (six) hours as needed (headache/pain).    Yes [provider]  meclizine (ANTIVERT) 12.5 MG tablet TAKE 1 TABLET BY  MOUTH 4 TIMES A DAY AS NEEDED FOR DIZZINESS 10/05/21  Yes Tanda Rockers, MD  oxymetazoline (AFRIN) 0.05 % nasal spray Place 1 spray into both nostrils 2 (two) times daily as needed for congestion.   Yes [provider]  Polyvinyl Alcohol-Povidone (REFRESH OP) Apply 1 drop to eye as needed (dry eyes).    Yes [provider]  predniSONE (DELTASONE) 10 MG tablet 2 DAILY UNTIL BETTER, THEN 1 X 3 DAYS AND STOP 11/19/21  Yes Tanda Rockers, MD  Respiratory Therapy Supplies (FLUTTER) DEVI Use every 4 hours as needed for cough and congestion   Yes [provider]    No Known Allergies  Patient Active Problem List   Diagnosis Date Noted   Hx of adenomatous colonic polyps 06/25/2015   Esophageal stricture    Essential hypertension 09/05/2011   Hyperlipidemia 08/08/2011   Esophageal dysphagia 03/12/2011   GERD 11/06/2009   Seasonal and perennial allergic rhinitis 05/31/2009   Severe persistent asthma 01/04/2008    Past Medical History:  Diagnosis Date   Acid reflux    Asthma 06/18/2011   Borderline hypertension    Cataract    Colon polyps 2011   1 Hyperplastic and 1 Tubular Adenoma    Dysphagia    Esophageal stricture    GERD (gastroesophageal reflux disease)    Hx of adenomatous colonic polyps 06/25/2015  Personal history of colonic polyps 12/1989   Shingles    Shoulder pain, right    Unspecified asthma(493.90)    Urinary frequency     Past Surgical History:  Procedure Laterality Date   BALLOON DILATION N/A 02/07/2015   Procedure: BALLOON DILATION;  Surgeon: Gatha Mayer, MD;  Location: [facility];  Service: Endoscopy;  Laterality: N/A;   ESOPHAGOGASTRODUODENOSCOPY (EGD) WITH PROPOFOL N/A 02/07/2015   Procedure: ESOPHAGOGASTRODUODENOSCOPY (EGD) WITH PROPOFOL;  Surgeon: Gatha Mayer, MD;  Location: [facility];  Service: Endoscopy;  Laterality: N/A;   HEMORRHOID SURGERY     HERNIA REPAIR     MASS EXCISION N/A 08/14/2016   Procedure: EXCISION  SEBACEOUS CYST BACK;  Surgeon: Johnathan Hausen, MD;  Location: [facility];  Service: General;  Laterality: N/A;  EXCISION SEBACEOUS CYST BACK   TONSILLECTOMY      Social History   Socioeconomic History   Marital status: Married    Spouse name: Not on file   Number of children: 7   Years of education: Not on file   Highest education level: Not on file  Occupational History   Occupation: Truck Geophysicist/field seismologist    Comment: retired    Fish farm manager: RETIRED  Tobacco Use   Smoking status: Never    Passive exposure: Never   Smokeless tobacco: Never  Vaping Use   Vaping Use: Never used  Substance and Sexual Activity   Alcohol use: No   Drug use: No   Sexual activity: Not on file  Other Topics Concern   Not on file  Social History Narrative   Patient is separated from spouse   Social Determinants of Health   Financial Resource Strain: Not on file  Food Insecurity: Not on file  Transportation Needs: Not on file  Physical Activity: Not on file  Stress: Not on file  Social Connections: Not on file  Intimate Partner Violence: Not on file    Family History  Problem Relation Age of Onset   Heart disease Mother    Kidney failure Sister    Breast cancer Sister    Lung cancer Brother    Brain cancer Sister    Asthma Sister    Colon cancer Neg Hx      Review of Systems  Constitutional: Negative.  Negative for chills and fever.  HENT: Negative.  Negative for congestion and sore throat.   Eyes: Negative.   Respiratory: Negative.  Negative for cough and shortness of breath.   Cardiovascular: Negative.  Negative for chest pain and palpitations.  Gastrointestinal: Negative.  Negative for abdominal pain, diarrhea, nausea and vomiting.  Genitourinary: Negative.  Negative for dysuria and hematuria.  Skin: Negative.  Negative for rash.  Neurological: Negative.  Negative for dizziness and headaches.  Endo/Heme/Allergies: Negative.   All other systems reviewed and are  negative.  Today's Vitals   09/02/22 1041  BP: 138/68  Pulse: 70  Temp: 97.9 F (36.6 C)  TempSrc: Oral  SpO2: 94%  Weight: 186 lb (84.4 kg)  Height: '5\' 6"'$  (1.676 m)   Body mass index is 30.02 kg/m.   Physical Exam Vitals reviewed.  Constitutional:      Appearance: Normal appearance.  HENT:     Head: Normocephalic.     Mouth/Throat:     Mouth: Mucous membranes are moist.     Pharynx: Oropharynx is clear.  Eyes:     Extraocular Movements: Extraocular movements intact.     Conjunctiva/sclera: Conjunctivae normal.     Pupils: Pupils  are equal, round, and reactive to light.  Cardiovascular:     Rate and Rhythm: Normal rate and regular rhythm.     Pulses: Normal pulses.     Heart sounds: Normal heart sounds.  Pulmonary:     Effort: Pulmonary effort is normal.     Breath sounds: Normal breath sounds.  Musculoskeletal:     Cervical back: No tenderness.  Lymphadenopathy:     Cervical: No cervical adenopathy.  Skin:    General: Skin is warm and dry.     Capillary Refill: Capillary refill takes less than 2 seconds.  Neurological:     General: No focal deficit present.     Mental Status: He is alert and oriented to person, place, and time.  Psychiatric:        Mood and Affect: Mood normal.        Behavior: Behavior normal.      ASSESSMENT & PLAN: A total of 44 minutes was spent with the patient and counseling/coordination of care regarding preparing for this visit, review of most recent office visit notes, review of multiple chronic medical problems and their management, review of all medications, review of most recent blood work results, education on nutrition, prognosis, documentation and need for follow-up.  Problem List Items Addressed This Visit       Cardiovascular and Mediastinum   Essential hypertension - Primary    Well-controlled hypertension.  Started amlodipine. Continue amlodipine 5 mg daily. Cardiovascular risks associated with hypertension  discussed.      Relevant Orders   CBC with Differential/Platelet   Comprehensive metabolic panel     Respiratory   Severe persistent asthma    Well-controlled. Sees pulmonary doctor on a regular basis. Continue Symbicort twice a day. Albuterol as rescue inhaler.      Relevant Medications   albuterol (PROVENTIL) (2.5 MG/3ML) 0.083% nebulizer solution   Other Relevant Orders   CBC with Differential/Platelet     Genitourinary   Stage 3a chronic kidney disease ([place])   Relevant Orders   CBC with Differential/Platelet   Comprehensive metabolic panel     Other   Dyslipidemia    Stable.  Diet and nutrition discussed.      Relevant Orders   CBC with Differential/Platelet   Lipid panel   Patient Instructions  Hypertension, Adult High blood pressure (hypertension) is when the force of blood pumping through the arteries is too strong. The arteries are the blood vessels that carry blood from the heart throughout the body. Hypertension forces the heart to work harder to pump blood and may cause arteries to become narrow or stiff. Untreated or uncontrolled hypertension can lead to a heart attack, heart failure, a stroke, kidney disease, and other problems. A blood pressure reading consists of a higher number over a lower number. Ideally, your blood pressure should be below 120/80. The first ("top") number is called the systolic pressure. It is a measure of the pressure in your arteries as your heart beats. The second ("bottom") number is called the diastolic pressure. It is a measure of the pressure in your arteries as the heart relaxes. What are the causes? The exact cause of this condition is not known. There are some conditions that result in high blood pressure. What increases the risk? Certain factors may make you more likely to develop high blood pressure. Some of these risk factors are under your control, including: Smoking. Not getting enough exercise or physical activity. Being  overweight. Having too much fat, sugar, calories,  or salt (sodium) in your diet. Drinking too much alcohol. Other risk factors include: Having a personal history of heart disease, diabetes, high cholesterol, or kidney disease. Stress. Having a family history of high blood pressure and high cholesterol. Having obstructive sleep apnea. Age. The risk increases with age. What are the signs or symptoms? High blood pressure may not cause symptoms. Very high blood pressure (hypertensive crisis) may cause: Headache. Fast or irregular heartbeats (palpitations). Shortness of breath. Nosebleed. Nausea and vomiting. Vision changes. Severe chest pain, dizziness, and seizures. How is this diagnosed? This condition is diagnosed by measuring your blood pressure while you are seated, with your arm resting on a flat surface, your legs uncrossed, and your feet flat on the floor. The cuff of the blood pressure monitor will be placed directly against the skin of your upper arm at the level of your heart. Blood pressure should be measured at least twice using the same arm. Certain conditions can cause a difference in blood pressure between your right and left arms. If you have a high blood pressure reading during one visit or you have normal blood pressure with other risk factors, you may be asked to: Return on a different day to have your blood pressure checked again. Monitor your blood pressure at home for 1 week or longer. If you are diagnosed with hypertension, you may have other blood or imaging tests to help your health care provider understand your overall risk for other conditions. How is this treated? This condition is treated by making healthy lifestyle changes, such as eating healthy foods, exercising more, and reducing your alcohol intake. You may be referred for counseling on a healthy diet and physical activity. Your health care provider may prescribe medicine if lifestyle changes are not enough to  get your blood pressure under control and if: Your systolic blood pressure is above 130. Your diastolic blood pressure is above 80. Your personal target blood pressure may vary depending on your medical conditions, your age, and other factors. Follow these instructions at home: Eating and drinking  Eat a diet that is high in fiber and potassium, and low in sodium, added sugar, and fat. An example of this eating plan is called the DASH diet. DASH stands for Dietary Approaches to Stop Hypertension. To eat this way: Eat plenty of fresh fruits and vegetables. Try to fill one half of your plate at each meal with fruits and vegetables. Eat whole grains, such as whole-wheat pasta, brown rice, or whole-grain bread. Fill about one fourth of your plate with whole grains. Eat or drink low-fat dairy products, such as skim milk or low-fat yogurt. Avoid fatty cuts of meat, processed or cured meats, and poultry with skin. Fill about one fourth of your plate with lean proteins, such as fish, chicken without skin, beans, eggs, or tofu. Avoid pre-made and processed foods. These tend to be higher in sodium, added sugar, and fat. Reduce your daily sodium intake. Many people with hypertension should eat less than 1,500 mg of sodium a day. Do not drink alcohol if: Your health care provider tells you not to drink. You are pregnant, may be pregnant, or are planning to become pregnant. If you drink alcohol: Limit how much you have to: 0-1 drink a day for women. 0-2 drinks a day for men. Know how much alcohol is in your drink. In the [place] one drink equals one 12 oz bottle of beer (355 mL), one 5 oz glass of wine (148 mL), or one  1 oz glass of hard liquor (44 mL). Lifestyle  Work with your health care provider to maintain a healthy body weight or to lose weight. Ask what an ideal weight is for you. Get at least 30 minutes of exercise that causes your heart to beat faster (aerobic exercise) most days of the week.  Activities may include walking, swimming, or biking. Include exercise to strengthen your muscles (resistance exercise), such as Pilates or lifting weights, as part of your weekly exercise routine. Try to do these types of exercises for 30 minutes at least 3 days a week. Do not use any products that contain nicotine or tobacco. These products include cigarettes, chewing tobacco, and vaping devices, such as e-cigarettes. If you need help quitting, ask your health care provider. Monitor your blood pressure at home as told by your health care provider. Keep all follow-up visits. This is important. Medicines Take over-the-counter and prescription medicines only as told by your health care provider. Follow directions carefully. Blood pressure medicines must be taken as prescribed. Do not skip doses of blood pressure medicine. Doing this puts you at risk for problems and can make the medicine less effective. Ask your health care provider about side effects or reactions to medicines that you should watch for. Contact a health care provider if you: Think you are having a reaction to a medicine you are taking. Have headaches that keep coming back (recurring). Feel dizzy. Have swelling in your ankles. Have trouble with your vision. Get help right away if you: Develop a severe headache or confusion. Have unusual weakness or numbness. Feel faint. Have severe pain in your chest or abdomen. Vomit repeatedly. Have trouble breathing. These symptoms may be an emergency. Get help right away. Call 911. Do not wait to see if the symptoms will go away. Do not drive yourself to the hospital. Summary Hypertension is when the force of blood pumping through your arteries is too strong. If this condition is not controlled, it may put you at risk for serious complications. Your personal target blood pressure may vary depending on your medical conditions, your age, and other factors. For most people, a normal blood  pressure is less than 120/80. Hypertension is treated with lifestyle changes, medicines, or a combination of both. Lifestyle changes include losing weight, eating a healthy, low-sodium diet, exercising more, and limiting alcohol. This information is not intended to replace advice given to you by your health care provider. Make sure you discuss any questions you have with your health care provider. Document Revised: 10/16/2021 Document Reviewed: 10/16/2021 Elsevier Patient Education  [place], MD [facility]

## 2022-09-06 DIAGNOSIS — M9902 Segmental and somatic dysfunction of thoracic region: Secondary | ICD-10-CM | POA: Diagnosis not present

## 2022-09-06 DIAGNOSIS — M9901 Segmental and somatic dysfunction of cervical region: Secondary | ICD-10-CM | POA: Diagnosis not present

## 2022-09-06 DIAGNOSIS — M62830 Muscle spasm of back: Secondary | ICD-10-CM | POA: Diagnosis not present

## 2022-09-06 DIAGNOSIS — M9903 Segmental and somatic dysfunction of lumbar region: Secondary | ICD-10-CM | POA: Diagnosis not present

## 2022-09-23 DIAGNOSIS — M9902 Segmental and somatic dysfunction of thoracic region: Secondary | ICD-10-CM | POA: Diagnosis not present

## 2022-09-23 DIAGNOSIS — M9903 Segmental and somatic dysfunction of lumbar region: Secondary | ICD-10-CM | POA: Diagnosis not present

## 2022-09-23 DIAGNOSIS — M62830 Muscle spasm of back: Secondary | ICD-10-CM | POA: Diagnosis not present

## 2022-09-23 DIAGNOSIS — M9901 Segmental and somatic dysfunction of cervical region: Secondary | ICD-10-CM | POA: Diagnosis not present

## 2022-09-30 DIAGNOSIS — M62830 Muscle spasm of back: Secondary | ICD-10-CM | POA: Diagnosis not present

## 2022-09-30 DIAGNOSIS — M9901 Segmental and somatic dysfunction of cervical region: Secondary | ICD-10-CM | POA: Diagnosis not present

## 2022-09-30 DIAGNOSIS — M9903 Segmental and somatic dysfunction of lumbar region: Secondary | ICD-10-CM | POA: Diagnosis not present

## 2022-09-30 DIAGNOSIS — M9902 Segmental and somatic dysfunction of thoracic region: Secondary | ICD-10-CM | POA: Diagnosis not present

## 2022-10-04 DIAGNOSIS — M9902 Segmental and somatic dysfunction of thoracic region: Secondary | ICD-10-CM | POA: Diagnosis not present

## 2022-10-04 DIAGNOSIS — M9901 Segmental and somatic dysfunction of cervical region: Secondary | ICD-10-CM | POA: Diagnosis not present

## 2022-10-04 DIAGNOSIS — M62830 Muscle spasm of back: Secondary | ICD-10-CM | POA: Diagnosis not present

## 2022-10-04 DIAGNOSIS — M9903 Segmental and somatic dysfunction of lumbar region: Secondary | ICD-10-CM | POA: Diagnosis not present

## 2022-10-07 DIAGNOSIS — M9903 Segmental and somatic dysfunction of lumbar region: Secondary | ICD-10-CM | POA: Diagnosis not present

## 2022-10-07 DIAGNOSIS — M62830 Muscle spasm of back: Secondary | ICD-10-CM | POA: Diagnosis not present

## 2022-10-07 DIAGNOSIS — M9901 Segmental and somatic dysfunction of cervical region: Secondary | ICD-10-CM | POA: Diagnosis not present

## 2022-10-07 DIAGNOSIS — M9902 Segmental and somatic dysfunction of thoracic region: Secondary | ICD-10-CM | POA: Diagnosis not present

## 2022-10-10 ENCOUNTER — Ambulatory Visit: Payer: Medicare Other | Admitting: Emergency Medicine

## 2022-10-10 ENCOUNTER — Telehealth: Payer: Self-pay

## 2022-10-10 NOTE — Telephone Encounter (Signed)
Patient is scheduled for an appointment today.   Reason for Call Symptomatic / Request for [place] states pt has been having some heavy rectal bleeding since this morning. Translation No Disp. Time Eilene Ghazi Time) Disposition Final User 10/09/2022 4:08:53 PM Attempt made - message left Janene Madeira, RN, Wells Guiles 10/09/2022 4:17:48 PM Attempt made - message left Janene Madeira, RN, Wells Guiles 10/09/2022 4:24:24 PM Send To RN Personal Janene Madeira, RN, Wells Guiles 10/09/2022 4:41:31 PM FINAL ATTEMPT MADE - message left Yes Janene Madeira, RN, Wells Guiles Final Disposition 10/09/2022 4:41:31 PM FINAL ATTEMPT MADE - message left Yes [place], RN, Wells Guiles

## 2022-10-11 ENCOUNTER — Ambulatory Visit (INDEPENDENT_AMBULATORY_CARE_PROVIDER_SITE_OTHER): Payer: Medicare Other | Admitting: Internal Medicine

## 2022-10-11 ENCOUNTER — Encounter: Payer: Self-pay | Admitting: Internal Medicine

## 2022-10-11 VITALS — BP 136/78 | HR 80 | Temp 97.8°F | Ht 66.0 in | Wt 188.0 lb

## 2022-10-11 DIAGNOSIS — K921 Melena: Secondary | ICD-10-CM | POA: Diagnosis not present

## 2022-10-11 DIAGNOSIS — N1831 Chronic kidney disease, stage 3a: Secondary | ICD-10-CM

## 2022-10-11 DIAGNOSIS — I10 Essential (primary) hypertension: Secondary | ICD-10-CM | POA: Diagnosis not present

## 2022-10-11 LAB — HEPATIC FUNCTION PANEL
ALT: 12 U/L (ref 0–53)
AST: 15 U/L (ref 0–37)
Albumin: 4.1 g/dL (ref 3.5–5.2)
Alkaline Phosphatase: 69 U/L (ref 39–117)
Bilirubin, Direct: 0.1 mg/dL (ref 0.0–0.3)
Total Bilirubin: 0.6 mg/dL (ref 0.2–1.2)
Total Protein: 6.8 g/dL (ref 6.0–8.3)

## 2022-10-11 LAB — CBC WITH DIFFERENTIAL/PLATELET
Basophils Absolute: 0.1 10*3/uL (ref 0.0–0.1)
Basophils Relative: 1.2 % (ref 0.0–3.0)
Eosinophils Absolute: 0.5 10*3/uL (ref 0.0–0.7)
Eosinophils Relative: 6.7 % — ABNORMAL HIGH (ref 0.0–5.0)
HCT: 43.7 % (ref 39.0–52.0)
Hemoglobin: 14.2 g/dL (ref 13.0–17.0)
Lymphocytes Relative: 41.1 % (ref 12.0–46.0)
Lymphs Abs: 3.2 10*3/uL (ref 0.7–4.0)
MCHC: 32.5 g/dL (ref 30.0–36.0)
MCV: 90.1 fl (ref 78.0–100.0)
Monocytes Absolute: 0.6 10*3/uL (ref 0.1–1.0)
Monocytes Relative: 7.6 % (ref 3.0–12.0)
Neutro Abs: 3.4 10*3/uL (ref 1.4–7.7)
Neutrophils Relative %: 43.4 % (ref 43.0–77.0)
Platelets: 343 10*3/uL (ref 150.0–400.0)
RBC: 4.85 Mil/uL (ref 4.22–5.81)
RDW: 13.5 % (ref 11.5–15.5)
WBC: 7.8 10*3/uL (ref 4.0–10.5)

## 2022-10-11 LAB — BASIC METABOLIC PANEL
BUN: 11 mg/dL (ref 6–23)
CO2: 26 mEq/L (ref 19–32)
Calcium: 8.9 mg/dL (ref 8.4–10.5)
Chloride: 106 mEq/L (ref 96–112)
Creatinine, Ser: 1.35 mg/dL (ref 0.40–1.50)
GFR: 48.92 mL/min — ABNORMAL LOW (ref >60.00)
Glucose, Bld: 115 mg/dL — ABNORMAL HIGH (ref 70–99)
Potassium: 3.7 mEq/L (ref 3.5–5.1)
Sodium: 141 mEq/L (ref 135–145)

## 2022-10-11 LAB — PROTIME-INR
INR: 1 ratio (ref 0.8–1.0)
Prothrombin Time: 11.4 s (ref 9.6–13.1)

## 2022-10-11 NOTE — Progress Notes (Unsigned)
Patient ID: [name], male   DOB: 1941-04-06, 81 y.o.   MRN: [id number]        Chief Complaint: follow up hematochezia, ckd3a, htn       HPI:  [name] is a 81 y.o. male here with c/o 1 episode small volume BRBPR, and Denies worsening reflux, abd pain, dysphagia, n/v, bowel change.  Pt denies chest pain, increased sob or doe, wheezing, orthopnea, PND, increased LE swelling, palpitations, dizziness or syncope.   Pt denies polydipsia, polyuria, or new focal neuro s/s. Last colonoscopy 2016 with polyp and mild diverticulosis.   Wt Readings from Last 3 Encounters:  10/11/22 188 lb (85.3 kg)  09/02/22 186 lb (84.4 kg)  07/16/22 185 lb 3.2 oz (84 kg)   BP Readings from Last 3 Encounters:  10/11/22 136/78  09/02/22 138/68  07/16/22 132/74         Past Medical History:  Diagnosis Date   Acid reflux    Asthma 06/18/2011   Borderline hypertension    Cataract    Colon polyps 2011   1 Hyperplastic and 1 Tubular Adenoma    Dysphagia    Esophageal stricture    GERD (gastroesophageal reflux disease)    Hx of adenomatous colonic polyps 06/25/2015   Personal history of colonic polyps 12/1989   Shingles    Shoulder pain, right    Unspecified asthma(493.90)    Urinary frequency    Past Surgical History:  Procedure Laterality Date   BALLOON DILATION N/A 02/07/2015   Procedure: BALLOON DILATION;  Surgeon: Gatha Mayer, MD;  Location: [facility];  Service: Endoscopy;  Laterality: N/A;   ESOPHAGOGASTRODUODENOSCOPY (EGD) WITH PROPOFOL N/A 02/07/2015   Procedure: ESOPHAGOGASTRODUODENOSCOPY (EGD) WITH PROPOFOL;  Surgeon: Gatha Mayer, MD;  Location: [facility];  Service: Endoscopy;  Laterality: N/A;   HEMORRHOID SURGERY     HERNIA REPAIR     MASS EXCISION N/A 08/14/2016   Procedure: EXCISION SEBACEOUS CYST BACK;  Surgeon: Johnathan Hausen, MD;  Location: [facility];  Service: General;  Laterality: N/A;  EXCISION SEBACEOUS CYST BACK   TONSILLECTOMY      reports that he  has never smoked. He has never been exposed to tobacco smoke. He has never used smokeless tobacco. He reports that he does not drink alcohol and does not use drugs. family history includes Asthma in his sister; Brain cancer in his sister; Breast cancer in his sister; Heart disease in his mother; Kidney failure in his sister; Lung cancer in his brother. No Known Allergies Current Outpatient Medications on File Prior to Visit  Medication Sig Dispense Refill   albuterol (PROVENTIL) (2.5 MG/3ML) 0.083% nebulizer solution USE 1 VIAL IN NEBULIZER EVERY 4 HOURS AS NEEDED 75 mL 5   amLODipine (NORVASC) 5 MG tablet Take 1 tablet (5 mg total) by mouth daily. 90 tablet 3   Ascorbic Acid (VITAMIN C) 1000 MG tablet Take 1,000 mg by mouth daily.     aspirin 81 MG EC tablet Take 81 mg by mouth every morning.     azithromycin (ZITHROMAX) 250 MG tablet 2 on 1st day and one daily x 5 days 6 tablet 11   budesonide-formoterol (SYMBICORT) 160-4.5 MCG/ACT inhaler TAKE 2 PUFFS FIRST THING IN AM AND THEN ANOTHER 2 PUFFS ABOUT 12 HOURS LATER. 30.6 each 1   CHELATED MAGNESIUM PO Take 1 tablet by mouth daily.     cholecalciferol (VITAMIN D3) 25 MCG (1000 UT) tablet Take 1,000 Units by mouth daily.  cyanocobalamin 1000 MCG tablet Take 1,000 mcg by mouth daily.     famotidine (PEPCID) 20 MG tablet Take 20 mg by mouth daily.     guaifenesin (HUMIBID E) 400 MG TABS tablet Take 400 mg by mouth every 4 (four) hours as needed.     Ibuprofen 200 MG CAPS Take 200 mg by mouth every 6 (six) hours as needed (headache/pain).      meclizine (ANTIVERT) 12.5 MG tablet TAKE 1 TABLET BY MOUTH 4 TIMES A DAY AS NEEDED FOR DIZZINESS 40 tablet 3   oxymetazoline (AFRIN) 0.05 % nasal spray Place 1 spray into both nostrils 2 (two) times daily as needed for congestion.     Polyvinyl Alcohol-Povidone (REFRESH OP) Apply 1 drop to eye as needed (dry eyes).      predniSONE (DELTASONE) 10 MG tablet 2 DAILY UNTIL BETTER, THEN 1 X 3 DAYS AND STOP 100  tablet 1   Respiratory Therapy Supplies (FLUTTER) DEVI Use every 4 hours as needed for cough and congestion     No current facility-administered medications on file prior to visit.        ROS:  All others reviewed and negative.  Objective        PE:  BP 136/78 (BP Location: Right Arm, Patient Position: Sitting, Cuff Size: Large)   Pulse 80   Temp 97.8 F (36.6 C) (Oral)   Ht '5\' 6"'$  (1.676 m)   Wt 188 lb (85.3 kg)   SpO2 92%   BMI 30.34 kg/m                 Constitutional: Pt appears in NAD               HENT: Head: NCAT.                Right Ear: External ear normal.                 Left Ear: External ear normal.                Eyes: . Pupils are equal, round, and reactive to light. Conjunctivae and EOM are normal               Nose: without d/c or deformity               Neck: Neck supple. Gross normal ROM               Cardiovascular: Normal rate and regular rhythm.                 Pulmonary/Chest: Effort normal and breath sounds without rales or wheezing.                Abd:  Soft, NT, ND, + BS, no organomegaly               Neurological: Pt is alert. At baseline orientation, motor grossly intact               Skin: Skin is warm. No rashes, no other new lesions, LE edema - none               Psychiatric: Pt behavior is normal without agitation   Micro: none  Cardiac tracings I have personally interpreted today:  none  Pertinent Radiological findings (summarize): none   Lab Results  Component Value Date   WBC 7.8 10/11/2022   HGB 14.2 10/11/2022   HCT 43.7 10/11/2022   PLT 343.0 10/11/2022  GLUCOSE 115 (H) 10/11/2022   CHOL 195 09/02/2022   TRIG 139.0 09/02/2022   HDL 45.60 09/02/2022   LDLDIRECT 124.7 04/30/2013   LDLCALC 122 (H) 09/02/2022   ALT 12 10/11/2022   AST 15 10/11/2022   NA 141 10/11/2022   K 3.7 10/11/2022   CL 106 10/11/2022   CREATININE 1.35 10/11/2022   BUN 11 10/11/2022   CO2 26 10/11/2022   TSH 1.05 01/14/2022   INR 1.0 10/11/2022    Assessment/Plan:  [name] is a 81 y.o. Black or African American [2] male with  has a past medical history of Acid reflux, Asthma (06/18/2011), Borderline hypertension, Cataract, Colon polyps (2011), Dysphagia, Esophageal stricture, GERD (gastroesophageal reflux disease), adenomatous colonic polyps (06/25/2015), Personal history of colonic polyps (12/1989), Shingles, Shoulder pain, right, Unspecified asthma(493.90), and Urinary frequency.  Hematochezia 1 episode 2 days ago small volume BRB without pain or fever or persistent bleeding, for cbc today, pt declines GI referral for now  Essential hypertension BP Readings from Last 3 Encounters:  10/11/22 136/78  09/02/22 138/68  07/16/22 132/74   Stable, pt to continue medical treatment norvasc 5 mg qd   Stage 3a chronic kidney disease ([place]) Lab Results  Component Value Date   CREATININE 1.35 10/11/2022   Stable overall, cont to avoid nephrotoxins  Followup: Return if symptoms worsen or fail to improve.  Cathlean Cower, MD 10/13/2022 4:41 PM [facility]

## 2022-10-11 NOTE — Patient Instructions (Signed)
Please call for any more bleeding for referral to GI  Please continue all other medications as before, and refills have been done if requested.  Please have the pharmacy call with any other refills you may need.  Please continue your efforts at being more active, low cholesterol diet, and weight control.  Please keep your appointments with your specialists as you may have planned  Please go to the LAB at the blood drawing area for the tests to be done  You will be contacted by phone if any changes need to be made immediately.  Otherwise, you will receive a letter about your results with an explanation, but please check with MyChart first.  Please remember to sign up for MyChart if you have not done so, as this will be important to you in the future with finding out test results, communicating by private email, and scheduling acute appointments online when needed.

## 2022-10-13 ENCOUNTER — Encounter: Payer: Self-pay | Admitting: Internal Medicine

## 2022-10-13 NOTE — Assessment & Plan Note (Signed)
Lab Results  Component Value Date   CREATININE 1.35 10/11/2022   Stable overall, cont to avoid nephrotoxins

## 2022-10-13 NOTE — Assessment & Plan Note (Signed)
1 episode 2 days ago small volume BRB without pain or fever or persistent bleeding, for cbc today, pt declines GI referral for now

## 2022-10-13 NOTE — Assessment & Plan Note (Signed)
BP Readings from Last 3 Encounters:  10/11/22 136/78  09/02/22 138/68  07/16/22 132/74   Stable, pt to continue medical treatment norvasc 5 mg qd

## 2022-10-25 DIAGNOSIS — M9902 Segmental and somatic dysfunction of thoracic region: Secondary | ICD-10-CM | POA: Diagnosis not present

## 2022-10-25 DIAGNOSIS — M62830 Muscle spasm of back: Secondary | ICD-10-CM | POA: Diagnosis not present

## 2022-10-25 DIAGNOSIS — M9903 Segmental and somatic dysfunction of lumbar region: Secondary | ICD-10-CM | POA: Diagnosis not present

## 2022-10-25 DIAGNOSIS — M9901 Segmental and somatic dysfunction of cervical region: Secondary | ICD-10-CM | POA: Diagnosis not present

## 2022-11-07 DIAGNOSIS — M9903 Segmental and somatic dysfunction of lumbar region: Secondary | ICD-10-CM | POA: Diagnosis not present

## 2022-11-07 DIAGNOSIS — M62830 Muscle spasm of back: Secondary | ICD-10-CM | POA: Diagnosis not present

## 2022-11-07 DIAGNOSIS — M9902 Segmental and somatic dysfunction of thoracic region: Secondary | ICD-10-CM | POA: Diagnosis not present

## 2022-11-07 DIAGNOSIS — M9901 Segmental and somatic dysfunction of cervical region: Secondary | ICD-10-CM | POA: Diagnosis not present

## 2022-11-19 DIAGNOSIS — M62830 Muscle spasm of back: Secondary | ICD-10-CM | POA: Diagnosis not present

## 2022-11-19 DIAGNOSIS — M9901 Segmental and somatic dysfunction of cervical region: Secondary | ICD-10-CM | POA: Diagnosis not present

## 2022-11-19 DIAGNOSIS — M9902 Segmental and somatic dysfunction of thoracic region: Secondary | ICD-10-CM | POA: Diagnosis not present

## 2022-11-19 DIAGNOSIS — M9903 Segmental and somatic dysfunction of lumbar region: Secondary | ICD-10-CM | POA: Diagnosis not present

## 2022-11-21 DIAGNOSIS — M9901 Segmental and somatic dysfunction of cervical region: Secondary | ICD-10-CM | POA: Diagnosis not present

## 2022-11-21 DIAGNOSIS — M9902 Segmental and somatic dysfunction of thoracic region: Secondary | ICD-10-CM | POA: Diagnosis not present

## 2022-11-21 DIAGNOSIS — M62830 Muscle spasm of back: Secondary | ICD-10-CM | POA: Diagnosis not present

## 2022-11-21 DIAGNOSIS — M9903 Segmental and somatic dysfunction of lumbar region: Secondary | ICD-10-CM | POA: Diagnosis not present

## 2022-11-22 ENCOUNTER — Other Ambulatory Visit: Payer: Self-pay | Admitting: Internal Medicine

## 2022-11-25 DIAGNOSIS — M9902 Segmental and somatic dysfunction of thoracic region: Secondary | ICD-10-CM | POA: Diagnosis not present

## 2022-11-25 DIAGNOSIS — M9901 Segmental and somatic dysfunction of cervical region: Secondary | ICD-10-CM | POA: Diagnosis not present

## 2022-11-25 DIAGNOSIS — M9903 Segmental and somatic dysfunction of lumbar region: Secondary | ICD-10-CM | POA: Diagnosis not present

## 2022-11-25 DIAGNOSIS — M62830 Muscle spasm of back: Secondary | ICD-10-CM | POA: Diagnosis not present

## 2022-12-02 DIAGNOSIS — M9901 Segmental and somatic dysfunction of cervical region: Secondary | ICD-10-CM | POA: Diagnosis not present

## 2022-12-02 DIAGNOSIS — M9902 Segmental and somatic dysfunction of thoracic region: Secondary | ICD-10-CM | POA: Diagnosis not present

## 2022-12-02 DIAGNOSIS — M62830 Muscle spasm of back: Secondary | ICD-10-CM | POA: Diagnosis not present

## 2022-12-02 DIAGNOSIS — M9903 Segmental and somatic dysfunction of lumbar region: Secondary | ICD-10-CM | POA: Diagnosis not present

## 2022-12-03 DIAGNOSIS — M62830 Muscle spasm of back: Secondary | ICD-10-CM | POA: Diagnosis not present

## 2022-12-03 DIAGNOSIS — M9901 Segmental and somatic dysfunction of cervical region: Secondary | ICD-10-CM | POA: Diagnosis not present

## 2022-12-03 DIAGNOSIS — M9902 Segmental and somatic dysfunction of thoracic region: Secondary | ICD-10-CM | POA: Diagnosis not present

## 2022-12-03 DIAGNOSIS — M9903 Segmental and somatic dysfunction of lumbar region: Secondary | ICD-10-CM | POA: Diagnosis not present

## 2022-12-04 DIAGNOSIS — M62830 Muscle spasm of back: Secondary | ICD-10-CM | POA: Diagnosis not present

## 2022-12-04 DIAGNOSIS — M9903 Segmental and somatic dysfunction of lumbar region: Secondary | ICD-10-CM | POA: Diagnosis not present

## 2022-12-04 DIAGNOSIS — M9902 Segmental and somatic dysfunction of thoracic region: Secondary | ICD-10-CM | POA: Diagnosis not present

## 2022-12-04 DIAGNOSIS — M9901 Segmental and somatic dysfunction of cervical region: Secondary | ICD-10-CM | POA: Diagnosis not present

## 2022-12-05 DIAGNOSIS — M9902 Segmental and somatic dysfunction of thoracic region: Secondary | ICD-10-CM | POA: Diagnosis not present

## 2022-12-05 DIAGNOSIS — M9903 Segmental and somatic dysfunction of lumbar region: Secondary | ICD-10-CM | POA: Diagnosis not present

## 2022-12-05 DIAGNOSIS — M9901 Segmental and somatic dysfunction of cervical region: Secondary | ICD-10-CM | POA: Diagnosis not present

## 2022-12-05 DIAGNOSIS — M62830 Muscle spasm of back: Secondary | ICD-10-CM | POA: Diagnosis not present

## 2022-12-09 DIAGNOSIS — M9902 Segmental and somatic dysfunction of thoracic region: Secondary | ICD-10-CM | POA: Diagnosis not present

## 2022-12-09 DIAGNOSIS — M9903 Segmental and somatic dysfunction of lumbar region: Secondary | ICD-10-CM | POA: Diagnosis not present

## 2022-12-09 DIAGNOSIS — M9901 Segmental and somatic dysfunction of cervical region: Secondary | ICD-10-CM | POA: Diagnosis not present

## 2022-12-09 DIAGNOSIS — M62830 Muscle spasm of back: Secondary | ICD-10-CM | POA: Diagnosis not present

## 2022-12-10 DIAGNOSIS — M9901 Segmental and somatic dysfunction of cervical region: Secondary | ICD-10-CM | POA: Diagnosis not present

## 2022-12-10 DIAGNOSIS — M62830 Muscle spasm of back: Secondary | ICD-10-CM | POA: Diagnosis not present

## 2022-12-10 DIAGNOSIS — M9903 Segmental and somatic dysfunction of lumbar region: Secondary | ICD-10-CM | POA: Diagnosis not present

## 2022-12-10 DIAGNOSIS — M9902 Segmental and somatic dysfunction of thoracic region: Secondary | ICD-10-CM | POA: Diagnosis not present

## 2022-12-11 DIAGNOSIS — M9902 Segmental and somatic dysfunction of thoracic region: Secondary | ICD-10-CM | POA: Diagnosis not present

## 2022-12-11 DIAGNOSIS — M9901 Segmental and somatic dysfunction of cervical region: Secondary | ICD-10-CM | POA: Diagnosis not present

## 2022-12-11 DIAGNOSIS — M9903 Segmental and somatic dysfunction of lumbar region: Secondary | ICD-10-CM | POA: Diagnosis not present

## 2022-12-11 DIAGNOSIS — M62830 Muscle spasm of back: Secondary | ICD-10-CM | POA: Diagnosis not present

## 2022-12-12 DIAGNOSIS — M9901 Segmental and somatic dysfunction of cervical region: Secondary | ICD-10-CM | POA: Diagnosis not present

## 2022-12-12 DIAGNOSIS — M9903 Segmental and somatic dysfunction of lumbar region: Secondary | ICD-10-CM | POA: Diagnosis not present

## 2022-12-12 DIAGNOSIS — M62830 Muscle spasm of back: Secondary | ICD-10-CM | POA: Diagnosis not present

## 2022-12-12 DIAGNOSIS — M9902 Segmental and somatic dysfunction of thoracic region: Secondary | ICD-10-CM | POA: Diagnosis not present

## 2022-12-17 DIAGNOSIS — M62830 Muscle spasm of back: Secondary | ICD-10-CM | POA: Diagnosis not present

## 2022-12-17 DIAGNOSIS — M9902 Segmental and somatic dysfunction of thoracic region: Secondary | ICD-10-CM | POA: Diagnosis not present

## 2022-12-17 DIAGNOSIS — M9903 Segmental and somatic dysfunction of lumbar region: Secondary | ICD-10-CM | POA: Diagnosis not present

## 2022-12-17 DIAGNOSIS — M9901 Segmental and somatic dysfunction of cervical region: Secondary | ICD-10-CM | POA: Diagnosis not present

## 2022-12-18 DIAGNOSIS — M62830 Muscle spasm of back: Secondary | ICD-10-CM | POA: Diagnosis not present

## 2022-12-18 DIAGNOSIS — M9902 Segmental and somatic dysfunction of thoracic region: Secondary | ICD-10-CM | POA: Diagnosis not present

## 2022-12-18 DIAGNOSIS — M9901 Segmental and somatic dysfunction of cervical region: Secondary | ICD-10-CM | POA: Diagnosis not present

## 2022-12-18 DIAGNOSIS — M9903 Segmental and somatic dysfunction of lumbar region: Secondary | ICD-10-CM | POA: Diagnosis not present

## 2022-12-24 DIAGNOSIS — M9902 Segmental and somatic dysfunction of thoracic region: Secondary | ICD-10-CM | POA: Diagnosis not present

## 2022-12-24 DIAGNOSIS — M62830 Muscle spasm of back: Secondary | ICD-10-CM | POA: Diagnosis not present

## 2022-12-24 DIAGNOSIS — M9901 Segmental and somatic dysfunction of cervical region: Secondary | ICD-10-CM | POA: Diagnosis not present

## 2022-12-24 DIAGNOSIS — M9903 Segmental and somatic dysfunction of lumbar region: Secondary | ICD-10-CM | POA: Diagnosis not present

## 2022-12-25 DIAGNOSIS — M9902 Segmental and somatic dysfunction of thoracic region: Secondary | ICD-10-CM | POA: Diagnosis not present

## 2022-12-25 DIAGNOSIS — M9903 Segmental and somatic dysfunction of lumbar region: Secondary | ICD-10-CM | POA: Diagnosis not present

## 2022-12-25 DIAGNOSIS — M62830 Muscle spasm of back: Secondary | ICD-10-CM | POA: Diagnosis not present

## 2022-12-25 DIAGNOSIS — M9901 Segmental and somatic dysfunction of cervical region: Secondary | ICD-10-CM | POA: Diagnosis not present

## 2022-12-26 DIAGNOSIS — M9903 Segmental and somatic dysfunction of lumbar region: Secondary | ICD-10-CM | POA: Diagnosis not present

## 2022-12-26 DIAGNOSIS — M62830 Muscle spasm of back: Secondary | ICD-10-CM | POA: Diagnosis not present

## 2022-12-26 DIAGNOSIS — M9901 Segmental and somatic dysfunction of cervical region: Secondary | ICD-10-CM | POA: Diagnosis not present

## 2022-12-26 DIAGNOSIS — M9902 Segmental and somatic dysfunction of thoracic region: Secondary | ICD-10-CM | POA: Diagnosis not present

## 2022-12-30 DIAGNOSIS — M9903 Segmental and somatic dysfunction of lumbar region: Secondary | ICD-10-CM | POA: Diagnosis not present

## 2022-12-30 DIAGNOSIS — M9901 Segmental and somatic dysfunction of cervical region: Secondary | ICD-10-CM | POA: Diagnosis not present

## 2022-12-30 DIAGNOSIS — M9902 Segmental and somatic dysfunction of thoracic region: Secondary | ICD-10-CM | POA: Diagnosis not present

## 2022-12-30 DIAGNOSIS — M62830 Muscle spasm of back: Secondary | ICD-10-CM | POA: Diagnosis not present

## 2022-12-31 DIAGNOSIS — M62830 Muscle spasm of back: Secondary | ICD-10-CM | POA: Diagnosis not present

## 2022-12-31 DIAGNOSIS — M9901 Segmental and somatic dysfunction of cervical region: Secondary | ICD-10-CM | POA: Diagnosis not present

## 2022-12-31 DIAGNOSIS — M9903 Segmental and somatic dysfunction of lumbar region: Secondary | ICD-10-CM | POA: Diagnosis not present

## 2022-12-31 DIAGNOSIS — M9902 Segmental and somatic dysfunction of thoracic region: Secondary | ICD-10-CM | POA: Diagnosis not present

## 2023-01-01 DIAGNOSIS — M9901 Segmental and somatic dysfunction of cervical region: Secondary | ICD-10-CM | POA: Diagnosis not present

## 2023-01-01 DIAGNOSIS — M9902 Segmental and somatic dysfunction of thoracic region: Secondary | ICD-10-CM | POA: Diagnosis not present

## 2023-01-01 DIAGNOSIS — M62830 Muscle spasm of back: Secondary | ICD-10-CM | POA: Diagnosis not present

## 2023-01-01 DIAGNOSIS — M9903 Segmental and somatic dysfunction of lumbar region: Secondary | ICD-10-CM | POA: Diagnosis not present

## 2023-01-07 DIAGNOSIS — M9901 Segmental and somatic dysfunction of cervical region: Secondary | ICD-10-CM | POA: Diagnosis not present

## 2023-01-07 DIAGNOSIS — M9903 Segmental and somatic dysfunction of lumbar region: Secondary | ICD-10-CM | POA: Diagnosis not present

## 2023-01-07 DIAGNOSIS — M62830 Muscle spasm of back: Secondary | ICD-10-CM | POA: Diagnosis not present

## 2023-01-07 DIAGNOSIS — M9902 Segmental and somatic dysfunction of thoracic region: Secondary | ICD-10-CM | POA: Diagnosis not present

## 2023-01-08 DIAGNOSIS — M9902 Segmental and somatic dysfunction of thoracic region: Secondary | ICD-10-CM | POA: Diagnosis not present

## 2023-01-08 DIAGNOSIS — M9901 Segmental and somatic dysfunction of cervical region: Secondary | ICD-10-CM | POA: Diagnosis not present

## 2023-01-08 DIAGNOSIS — M9903 Segmental and somatic dysfunction of lumbar region: Secondary | ICD-10-CM | POA: Diagnosis not present

## 2023-01-08 DIAGNOSIS — M62830 Muscle spasm of back: Secondary | ICD-10-CM | POA: Diagnosis not present

## 2023-01-09 DIAGNOSIS — M9901 Segmental and somatic dysfunction of cervical region: Secondary | ICD-10-CM | POA: Diagnosis not present

## 2023-01-09 DIAGNOSIS — M9903 Segmental and somatic dysfunction of lumbar region: Secondary | ICD-10-CM | POA: Diagnosis not present

## 2023-01-09 DIAGNOSIS — M62830 Muscle spasm of back: Secondary | ICD-10-CM | POA: Diagnosis not present

## 2023-01-09 DIAGNOSIS — M9902 Segmental and somatic dysfunction of thoracic region: Secondary | ICD-10-CM | POA: Diagnosis not present

## 2023-01-14 DIAGNOSIS — M9901 Segmental and somatic dysfunction of cervical region: Secondary | ICD-10-CM | POA: Diagnosis not present

## 2023-01-14 DIAGNOSIS — M62830 Muscle spasm of back: Secondary | ICD-10-CM | POA: Diagnosis not present

## 2023-01-14 DIAGNOSIS — M9903 Segmental and somatic dysfunction of lumbar region: Secondary | ICD-10-CM | POA: Diagnosis not present

## 2023-01-14 DIAGNOSIS — M9902 Segmental and somatic dysfunction of thoracic region: Secondary | ICD-10-CM | POA: Diagnosis not present

## 2023-01-15 ENCOUNTER — Encounter: Payer: Self-pay | Admitting: Emergency Medicine

## 2023-01-15 ENCOUNTER — Ambulatory Visit (INDEPENDENT_AMBULATORY_CARE_PROVIDER_SITE_OTHER): Payer: Medicare Other | Admitting: Emergency Medicine

## 2023-01-15 VITALS — BP 136/80 | HR 71 | Temp 97.6°F | Ht 66.0 in | Wt 189.2 lb

## 2023-01-15 DIAGNOSIS — N1831 Chronic kidney disease, stage 3a: Secondary | ICD-10-CM | POA: Diagnosis not present

## 2023-01-15 DIAGNOSIS — R202 Paresthesia of skin: Secondary | ICD-10-CM | POA: Insufficient documentation

## 2023-01-15 DIAGNOSIS — R7303 Prediabetes: Secondary | ICD-10-CM | POA: Insufficient documentation

## 2023-01-15 DIAGNOSIS — E785 Hyperlipidemia, unspecified: Secondary | ICD-10-CM | POA: Diagnosis not present

## 2023-01-15 DIAGNOSIS — I10 Essential (primary) hypertension: Secondary | ICD-10-CM | POA: Diagnosis not present

## 2023-01-15 LAB — CBC WITH DIFFERENTIAL/PLATELET
Basophils Absolute: 0.1 10*3/uL (ref 0.0–0.1)
Basophils Relative: 0.9 % (ref 0.0–3.0)
Eosinophils Absolute: 0.6 10*3/uL (ref 0.0–0.7)
Eosinophils Relative: 7.4 % — ABNORMAL HIGH (ref 0.0–5.0)
HCT: 44.6 % (ref 39.0–52.0)
Hemoglobin: 14.8 g/dL (ref 13.0–17.0)
Lymphocytes Relative: 42.6 % (ref 12.0–46.0)
Lymphs Abs: 3.6 10*3/uL (ref 0.7–4.0)
MCHC: 33.1 g/dL (ref 30.0–36.0)
MCV: 89.4 fl (ref 78.0–100.0)
Monocytes Absolute: 0.7 10*3/uL (ref 0.1–1.0)
Monocytes Relative: 8.5 % (ref 3.0–12.0)
Neutro Abs: 3.5 10*3/uL (ref 1.4–7.7)
Neutrophils Relative %: 40.6 % — ABNORMAL LOW (ref 43.0–77.0)
Platelets: 305 10*3/uL (ref 150.0–400.0)
RBC: 4.99 Mil/uL (ref 4.22–5.81)
RDW: 13.6 % (ref 11.5–15.5)
WBC: 8.6 10*3/uL (ref 4.0–10.5)

## 2023-01-15 LAB — COMPREHENSIVE METABOLIC PANEL
ALT: 13 U/L (ref 0–53)
AST: 18 U/L (ref 0–37)
Albumin: 4.3 g/dL (ref 3.5–5.2)
Alkaline Phosphatase: 74 U/L (ref 39–117)
BUN: 12 mg/dL (ref 6–23)
CO2: 27 mEq/L (ref 19–32)
Calcium: 9.3 mg/dL (ref 8.4–10.5)
Chloride: 107 mEq/L (ref 96–112)
Creatinine, Ser: 1.4 mg/dL (ref 0.40–1.50)
GFR: 46.75 mL/min — ABNORMAL LOW (ref >60.00)
Glucose, Bld: 79 mg/dL (ref 70–99)
Potassium: 4.2 mEq/L (ref 3.5–5.1)
Sodium: 143 mEq/L (ref 135–145)
Total Bilirubin: 0.8 mg/dL (ref 0.2–1.2)
Total Protein: 7.3 g/dL (ref 6.0–8.3)

## 2023-01-15 LAB — VITAMIN B12: Vitamin B-12: 316 pg/mL (ref 211–911)

## 2023-01-15 LAB — LIPID PANEL
Cholesterol: 207 mg/dL — ABNORMAL HIGH (ref 0–200)
HDL: 48 mg/dL (ref >39.00)
LDL Cholesterol: 124 mg/dL — ABNORMAL HIGH (ref 0–99)
NonHDL: 158.93
Total CHOL/HDL Ratio: 4
Triglycerides: 177 mg/dL — ABNORMAL HIGH (ref 0.0–149.0)
VLDL: 35.4 mg/dL (ref 0.0–40.0)

## 2023-01-15 LAB — HEMOGLOBIN A1C: Hgb A1c MFr Bld: 5.1 % (ref 4.6–6.5)

## 2023-01-15 NOTE — Progress Notes (Signed)
Subjective:   Patient ID: [name]    DOB: 03-30-1941     Brief patient profile: 43  yobm denies ever smoking but has lifelong asthma with severe chronic pattern and best FEV1 of around 2.2 liters documented 2002 and also documented non-adherence  followed in pulmonary for primary care also for hbp and hyperlipidemia.   History of Present Illness    07/30/13 Allergy eval rx completed x 4 years > d/c'd and f/u allergy prn    04/03/2015 Follow up : chronic asthma/ HTN/non adherence / relies on samples/ no formulary / has med calendar  Pt returns for 1 month follow up .  Doing well with no asthma flare , on Dulera . Marland Kitchen  Need to establish with dentist > multiple teeth extracted  06/04/16 Dr Annamaria Boots rec N[place] > could not afford deductible   07/16/2022  f/u ov/[name] re: severe chronic asthma maint on symbicort  160 / no recent pred  needed  Chief Complaint  Patient presents with   Follow-up    Pt states his asthma is still the same without any asthma attacks.  Dyspnea:  slow up hills no change ex tol  Cough: none  Sleeping: flat bed / 2 pillows  SABA use: none  02: none  Covid status:  never had any vax  Rec For nasty mucus > zpak  No changer in medications Please schedule a follow up visit in 6 months but call sooner if needed     01/16/2023  f/u ov/[name] re: severe chronic asthma   maint on symbicort 160  / no recent pred  Chief Complaint  Patient presents with   Follow-up    Breathing doing well.  Slight sinus congestion with weather changes   Dyspnea:  MMRC2 = can't walk a nl pace on a flat grade s sob but does fine slow and flat  Cough: minimal mucoid in am but not noct assoc with nasal congestion  Sleeping: flat bed 2 pillows  SABA use: neither hfa nor neb  02: none  Covid status:   never vax, never infected      No obvious day to day or daytime variability or assoc excess/ purulent sputum or mucus plugs or hemoptysis or cp or chest tightness, subjective wheeze or  overt hb symptoms.   Sleeping  without nocturnal  or early am exacerbation  of respiratory  c/o's or need for noct saba. Also denies any obvious fluctuation of symptoms with weather or environmental changes or other aggravating or alleviating factors except as outlined above   No unusual exposure hx or h/o childhood pna/ asthma or knowledge of premature birth.  Current Allergies, Complete Past Medical History, Past Surgical History, Family History, and Social History were reviewed in [employer] record.  ROS  The following are not active complaints unless bolded Hoarseness, sore throat, dysphagia, dental problems, itching, sneezing,  nasal congestion or discharge of excess mucus or purulent secretions, ear ache,   fever, chills, sweats, unintended wt loss or wt gain, classically pleuritic or exertional cp,  orthopnea pnd or arm/hand swelling  or leg swelling, presyncope, palpitations, abdominal pain, anorexia, nausea, vomiting, diarrhea  or change in bowel habits or change in bladder habits, change in stools or change in urine, dysuria, hematuria,  rash, arthralgias, visual complaints, headache, numbness, weakness or ataxia or problems with walking or coordination,  change in mood or  memory.        Current Meds  Medication Sig   albuterol (PROVENTIL) (  2.5 MG/3ML) 0.083% nebulizer solution USE 1 VIAL IN NEBULIZER EVERY 4 HOURS AS NEEDED   amLODipine (NORVASC) 5 MG tablet Take 1 tablet (5 mg total) by mouth daily.   Ascorbic Acid (VITAMIN C) 1000 MG tablet Take 1,000 mg by mouth daily.   aspirin 81 MG EC tablet Take 81 mg by mouth every morning.   budesonide-formoterol (SYMBICORT) 160-4.5 MCG/ACT inhaler TAKE 2 PUFFS FIRST THING IN AM AND THEN ANOTHER 2 PUFFS ABOUT 12 HOURS LATER.   CHELATED MAGNESIUM PO Take 1 tablet by mouth daily.   cholecalciferol (VITAMIN D3) 25 MCG (1000 UT) tablet Take 1,000 Units by mouth daily.   cyanocobalamin 1000 MCG tablet Take 1,000 mcg by  mouth daily.   famotidine (PEPCID) 20 MG tablet Take 20 mg by mouth daily.   Ibuprofen 200 MG CAPS Take 200 mg by mouth every 6 (six) hours as needed (headache/pain).    meclizine (ANTIVERT) 12.5 MG tablet TAKE 1 TABLET BY MOUTH 4 TIMES A DAY AS NEEDED FOR DIZZINESS   oxymetazoline (AFRIN) 0.05 % nasal spray Place 1 spray into both nostrils 2 (two) times daily as needed for congestion.   Polyvinyl Alcohol-Povidone (REFRESH OP) Apply 1 drop to eye as needed (dry eyes).    Respiratory Therapy Supplies (FLUTTER) DEVI Use every 4 hours as needed for cough and congestion                   Past Medical Hx ASTHMA (ICD-493.90)  --chronic severe asthma with best FEV1 2.2 L documented 01/2001 and  documented non-adherence Mainly lives off samples of inhalers and nose sprays  - HFA 75% June 15, 2009 > 90% August 02, 2009 > 100% December 27, 2009  - Off allergy vaccines x7/2010 > highly allergic skin tests per Dr Annamaria Boots > restarted vaccine November 06, 2009 > stopped  07/2013  SHOULDER PAIN, RIGHT (ICD-719.41)   HYPERTENSION, BORDERLINE (ICD-401.9)  Shingles T 8 Left 08/2000 HEALTH MAINTENANCE...............................................................................................Marland KitchenWert  - Colonoscopy 12/1989,  2011  - dT 07/25/2014  - Pneumovax 02/2003 and 05/2009 - Prevnar 07/25/14 -Flu declined October 17, 2008 and September 21, 2009 , declined flu shot 2015, 2017  -CPX 07/21/2019            Objective:  Physical Exam    Wts  01/16/2023     191  07/16/2022     185   01/14/2022     183   07/20/2021    183  11/15/2020  184  08/08/2020   187 01/25/2020      193  09/29/2019    194  05/23/2014  201> 201 07/04/2014 > 07/25/14  196>198 08/22/2014 >194 09/19/2014 >198 10/11/2014 > 10/24/2014  196>   01/02/2015 193 > 03/06/2015  198 >198 04/03/2015 > 05/16/2015 196 >  06/02/2015 196 >197 06/16/2015 > 12/12/2015 199> 197 01/08/2016 >   02/16/2016     194 > 04/08/2016  193   Vital signs reviewed  01/16/2023  - Note at  rest 02 sats  95% on RA   General appearance:    amb bm nad    HEENT :  Oropharynx  clear   Nasal turbinates nl    NECK :  without JVD/Nodes/TM/ nl carotid upstrokes bilaterally   LUNGS: no acc muscle use,  Mod barrel  contour chest wall with bilateral  Distant bs s audible wheeze and  without cough on insp or exp maneuvers and mod  Hyperresonant  to  percussion bilaterally     CV:  RRR  no s3 or murmur or increase in P2, and no edema   ABD:  soft and nontender with pos mid insp Hoover's  in the supine position. No bruits or organomegaly appreciated, bowel sounds nl  MS:   Ext warm without deformities or   obvious joint restrictions , calf tenderness, cyanosis or clubbing  SKIN: warm and dry without lesions    NEURO:  alert, approp, nl sensorium with  no motor or cerebellar deficits apparent.

## 2023-01-15 NOTE — Assessment & Plan Note (Signed)
Well-controlled hypertension. Continue amlodipine 5 mg daily BP Readings from Last 3 Encounters:  01/15/23 136/80  10/11/22 136/78  09/02/22 138/68  Cardiovascular risks associated with hypertension discussed. Dietary approaches to stop hypertension discussed.

## 2023-01-15 NOTE — Assessment & Plan Note (Signed)
Stable.  Diet and nutrition discussed. Blood work done today.

## 2023-01-15 NOTE — Patient Instructions (Signed)
Health Maintenance After Age 83 After age 83, you are at a higher risk for certain long-term diseases and infections as well as injuries from falls. Falls are a major cause of broken bones and head injuries in people who are older than age 83. Getting regular preventive care can help to keep you healthy and well. Preventive care includes getting regular testing and making lifestyle changes as recommended by your health care provider. Talk with your health care provider about: Which screenings and tests you should have. A screening is a test that checks for a disease when you have no symptoms. A diet and exercise plan that is right for you. What should I know about screenings and tests to prevent falls? Screening and testing are the best ways to find a health problem early. Early diagnosis and treatment give you the best chance of managing medical conditions that are common after age 83. Certain conditions and lifestyle choices may make you more likely to have a fall. Your health care provider may recommend: Regular vision checks. Poor vision and conditions such as cataracts can make you more likely to have a fall. If you wear glasses, make sure to get your prescription updated if your vision changes. Medicine review. Work with your health care provider to regularly review all of the medicines you are taking, including over-the-counter medicines. Ask your health care provider about any side effects that may make you more likely to have a fall. Tell your health care provider if any medicines that you take make you feel dizzy or sleepy. Strength and balance checks. Your health care provider may recommend certain tests to check your strength and balance while standing, walking, or changing positions. Foot health exam. Foot pain and numbness, as well as not wearing proper footwear, can make you more likely to have a fall. Screenings, including: Osteoporosis screening. Osteoporosis is a condition that causes  the bones to get weaker and break more easily. Blood pressure screening. Blood pressure changes and medicines to control blood pressure can make you feel dizzy. Depression screening. You may be more likely to have a fall if you have a fear of falling, feel depressed, or feel unable to do activities that you used to do. Alcohol use screening. Using too much alcohol can affect your balance and may make you more likely to have a fall. Follow these instructions at home: Lifestyle Do not drink alcohol if: Your health care provider tells you not to drink. If you drink alcohol: Limit how much you have to: 0-1 drink a day for women. 0-2 drinks a day for men. Know how much alcohol is in your drink. In the [place] one drink equals one 12 oz bottle of beer (355 mL), one 5 oz glass of wine (148 mL), or one 1 oz glass of hard liquor (44 mL). Do not use any products that contain nicotine or tobacco. These products include cigarettes, chewing tobacco, and vaping devices, such as e-cigarettes. If you need help quitting, ask your health care provider. Activity  Follow a regular exercise program to stay fit. This will help you maintain your balance. Ask your health care provider what types of exercise are appropriate for you. If you need a cane or walker, use it as recommended by your health care provider. Wear supportive shoes that have nonskid soles. Safety  Remove any tripping hazards, such as rugs, cords, and clutter. Install safety equipment such as grab bars in bathrooms and safety rails on stairs. Keep rooms and walkways   well-lit. General instructions Talk with your health care provider about your risks for falling. Tell your health care provider if: You fall. Be sure to tell your health care provider about all falls, even ones that seem minor. You feel dizzy, tiredness (fatigue), or off-balance. Take over-the-counter and prescription medicines only as told by your health care provider. These include  supplements. Eat a healthy diet and maintain a healthy weight. A healthy diet includes low-fat dairy products, low-fat (lean) meats, and fiber from whole grains, beans, and lots of fruits and vegetables. Stay current with your vaccines. Schedule regular health, dental, and eye exams. Summary Having a healthy lifestyle and getting preventive care can help to protect your health and wellness after age 83. Screening and testing are the best way to find a health problem early and help you avoid having a fall. Early diagnosis and treatment give you the best chance for managing medical conditions that are more common for people who are older than age 83. Falls are a major cause of broken bones and head injuries in people who are older than age 83. Take precautions to prevent a fall at home. Work with your health care provider to learn what changes you can make to improve your health and wellness and to prevent falls. This information is not intended to replace advice given to you by your health care provider. Make sure you discuss any questions you have with your health care provider. Document Revised: 04/30/2021 Document Reviewed: 04/30/2021 Elsevier Patient Education  2023 [employer]  

## 2023-01-15 NOTE — Assessment & Plan Note (Signed)
Clinically stable.  No abnormal physical findings. No history of diabetes. No circulatory problems detected on physical exam. Asymptomatic at present time. Blood work done today. Differential diagnosis discussed.

## 2023-01-15 NOTE — Assessment & Plan Note (Signed)
Stable.  Diet and nutrition discussed. 

## 2023-01-15 NOTE — Progress Notes (Signed)
[name] 82 y.o.   Chief Complaint  Patient presents with   Acute Visit    Feet issues, patient states his heels burn, swelling,     HISTORY OF PRESENT ILLNESS: This is a 82 y.o. male complaining of bilateral intermittent burning of his feet for the past several weeks No other complaints or medical concerns today.  No history of diabetes.  HPI   Prior to Admission medications   Medication Sig Start Date End Date Taking? Authorizing Provider  albuterol (PROVENTIL) (2.5 MG/3ML) 0.083% nebulizer solution USE 1 VIAL IN NEBULIZER EVERY 4 HOURS AS NEEDED 09/02/22   Horald Pollen, MD  amLODipine (NORVASC) 5 MG tablet Take 1 tablet (5 mg total) by mouth daily. 05/08/22   Horald Pollen, MD  Ascorbic Acid (VITAMIN C) 1000 MG tablet Take 1,000 mg by mouth daily.    [provider]  aspirin 81 MG EC tablet Take 81 mg by mouth every morning.    [provider]  budesonide-formoterol (SYMBICORT) 160-4.5 MCG/ACT inhaler TAKE 2 PUFFS FIRST THING IN AM AND THEN ANOTHER 2 PUFFS ABOUT 12 HOURS LATER. 03/20/22   Tanda Rockers, MD  CHELATED MAGNESIUM PO Take 1 tablet by mouth daily.    [provider]  cholecalciferol (VITAMIN D3) 25 MCG (1000 UT) tablet Take 1,000 Units by mouth daily.    [provider]  cyanocobalamin 1000 MCG tablet Take 1,000 mcg by mouth daily.    [provider]  famotidine (PEPCID) 20 MG tablet Take 20 mg by mouth daily.    [provider]  guaifenesin (HUMIBID E) 400 MG TABS tablet Take 400 mg by mouth every 4 (four) hours as needed.    [provider]  Ibuprofen 200 MG CAPS Take 200 mg by mouth every 6 (six) hours as needed (headache/pain).     [provider]  meclizine (ANTIVERT) 12.5 MG tablet TAKE 1 TABLET BY MOUTH 4 TIMES A DAY AS NEEDED FOR DIZZINESS 11/22/22   Tanda Rockers, MD  oxymetazoline (AFRIN) 0.05 % nasal spray Place 1 spray into both nostrils 2 (two) times daily as needed  for congestion.    [provider]  Polyvinyl Alcohol-Povidone (REFRESH OP) Apply 1 drop to eye as needed (dry eyes).     [provider]  predniSONE (DELTASONE) 10 MG tablet 2 DAILY UNTIL BETTER, THEN 1 X 3 DAYS AND STOP 11/19/21   Tanda Rockers, MD  Respiratory Therapy Supplies (FLUTTER) DEVI Use every 4 hours as needed for cough and congestion    [provider]    No Known Allergies  Patient Active Problem List   Diagnosis Date Noted   Hematochezia 10/11/2022   Stage 3a chronic kidney disease ([place]) 06/22/2017   Hx of adenomatous colonic polyps 06/25/2015   Esophageal stricture    Essential hypertension 09/05/2011   Dyslipidemia 08/08/2011   Esophageal dysphagia 03/12/2011   GERD 11/06/2009   Seasonal and perennial allergic rhinitis 05/31/2009   Severe persistent asthma 01/04/2008    Past Medical History:  Diagnosis Date   Acid reflux    Asthma 06/18/2011   Borderline hypertension    Cataract    Colon polyps 2011   1 Hyperplastic and 1 Tubular Adenoma    Dysphagia    Esophageal stricture    GERD (gastroesophageal reflux disease)    Hx of adenomatous colonic polyps 06/25/2015   Personal history of colonic polyps 12/1989   Shingles    Shoulder pain, right  Unspecified asthma(493.90)    Urinary frequency     Past Surgical History:  Procedure Laterality Date   BALLOON DILATION N/A 02/07/2015   Procedure: BALLOON DILATION;  Surgeon: Gatha Mayer, MD;  Location: [facility];  Service: Endoscopy;  Laterality: N/A;   ESOPHAGOGASTRODUODENOSCOPY (EGD) WITH PROPOFOL N/A 02/07/2015   Procedure: ESOPHAGOGASTRODUODENOSCOPY (EGD) WITH PROPOFOL;  Surgeon: Gatha Mayer, MD;  Location: [facility];  Service: Endoscopy;  Laterality: N/A;   HEMORRHOID SURGERY     HERNIA REPAIR     MASS EXCISION N/A 08/14/2016   Procedure: EXCISION SEBACEOUS CYST BACK;  Surgeon: Johnathan Hausen, MD;  Location: [facility];  Service: General;  Laterality: N/A;   EXCISION SEBACEOUS CYST BACK   TONSILLECTOMY      Social History   Socioeconomic History   Marital status: Married    Spouse name: Not on file   Number of children: 7   Years of education: Not on file   Highest education level: Not on file  Occupational History   Occupation: Truck Geophysicist/field seismologist    Comment: retired    Fish farm manager: RETIRED  Tobacco Use   Smoking status: Never    Passive exposure: Never   Smokeless tobacco: Never  Vaping Use   Vaping Use: Never used  Substance and Sexual Activity   Alcohol use: No   Drug use: No   Sexual activity: Not on file  Other Topics Concern   Not on file  Social History Narrative   Patient is separated from spouse   Social Determinants of Health   Financial Resource Strain: Not on file  Food Insecurity: Not on file  Transportation Needs: Not on file  Physical Activity: Not on file  Stress: Not on file  Social Connections: Not on file  Intimate Partner Violence: Not on file    Family History  Problem Relation Age of Onset   Heart disease Mother    Kidney failure Sister    Breast cancer Sister    Lung cancer Brother    Brain cancer Sister    Asthma Sister    Colon cancer Neg Hx      Review of Systems  Constitutional: Negative.  Negative for chills and fever.  HENT: Negative.  Negative for congestion and sore throat.   Respiratory: Negative.  Negative for cough and shortness of breath.   Cardiovascular: Negative.  Negative for chest pain and palpitations.  Gastrointestinal:  Negative for abdominal pain, nausea and vomiting.  Genitourinary: Negative.  Negative for dysuria.  Skin: Negative.  Negative for rash.  Neurological: Negative.  Negative for dizziness and headaches.  All other systems reviewed and are negative.  Today's Vitals   01/15/23 1027  BP: 136/80  Pulse: 71  Temp: 97.6 F (36.4 C)  TempSrc: Oral  SpO2: 95%  Weight: 189 lb 4 oz (85.8 kg)  Height: '5\' 6"'$  (1.676 m)   Body mass index is 30.55  kg/m.   Physical Exam Vitals reviewed.  Constitutional:      Appearance: Normal appearance.  HENT:     Head: Normocephalic.  Eyes:     Extraocular Movements: Extraocular movements intact.  Cardiovascular:     Rate and Rhythm: Normal rate and regular rhythm.     Pulses: Normal pulses.     Heart sounds: Normal heart sounds.  Pulmonary:     Effort: Pulmonary effort is normal.     Breath sounds: Normal breath sounds.  Musculoskeletal:        General: Normal range of  motion.     Cervical back: No tenderness.     Comments: Feet: Warm to touch.  No ecchymosis or erythema.  No swelling.  Excellent capillary refill.  Good distal pulses.  No tenderness.  No skin breakdowns.  Good sensation.  Neurovascularly intact.  No abnormal findings.  Lymphadenopathy:     Cervical: No cervical adenopathy.  Skin:    General: Skin is warm and dry.  Neurological:     General: No focal deficit present.     Mental Status: He is alert and oriented to person, place, and time.  Psychiatric:        Mood and Affect: Mood normal.        Behavior: Behavior normal.      ASSESSMENT & PLAN: A total of 44 minutes was spent with the patient and counseling/coordination of care regarding preparing for this visit, review of most recent office visit notes, review of most recent blood work results, review of multiple chronic medical conditions and their management, review of all medications, differential diagnosis of feet paresthesias and need for blood work today, education on nutrition, prognosis, documentation and need for follow-up.  Problem List Items Addressed This Visit       Cardiovascular and Mediastinum   Essential hypertension    Well-controlled hypertension. Continue amlodipine 5 mg daily BP Readings from Last 3 Encounters:  01/15/23 136/80  10/11/22 136/78  09/02/22 138/68  Cardiovascular risks associated with hypertension discussed. Dietary approaches to stop hypertension discussed.        Relevant Orders   CBC with Differential/Platelet   Comprehensive metabolic panel     Genitourinary   Stage 3a chronic kidney disease ([place])    Advised to stay well-hydrated and avoid NSAIDs.      Relevant Orders   CBC with Differential/Platelet   Comprehensive metabolic panel     Other   Dyslipidemia    Stable.  Diet and nutrition discussed.      Relevant Orders   CBC with Differential/Platelet   Hemoglobin A1c   Comprehensive metabolic panel   Lipid panel   Paresthesia of both feet - Primary    Clinically stable.  No abnormal physical findings. No history of diabetes. No circulatory problems detected on physical exam. Asymptomatic at present time. Blood work done today. Differential diagnosis discussed.      Relevant Orders   CBC with Differential/Platelet   Vitamin B12   Hemoglobin A1c   Prediabetes    Stable.  Diet and nutrition discussed. Blood work done today.      Relevant Orders   Hemoglobin A1c   Patient Instructions  Health Maintenance After Age 61 After age 29, you are at a higher risk for certain long-term diseases and infections as well as injuries from falls. Falls are a major cause of broken bones and head injuries in people who are older than age 22. Getting regular preventive care can help to keep you healthy and well. Preventive care includes getting regular testing and making lifestyle changes as recommended by your health care provider. Talk with your health care provider about: Which screenings and tests you should have. A screening is a test that checks for a disease when you have no symptoms. A diet and exercise plan that is right for you. What should I know about screenings and tests to prevent falls? Screening and testing are the best ways to find a health problem early. Early diagnosis and treatment give you the best chance of managing medical conditions that are  common after age 52. Certain conditions and lifestyle choices may make you more  likely to have a fall. Your health care provider may recommend: Regular vision checks. Poor vision and conditions such as cataracts can make you more likely to have a fall. If you wear glasses, make sure to get your prescription updated if your vision changes. Medicine review. Work with your health care provider to regularly review all of the medicines you are taking, including over-the-counter medicines. Ask your health care provider about any side effects that may make you more likely to have a fall. Tell your health care provider if any medicines that you take make you feel dizzy or sleepy. Strength and balance checks. Your health care provider may recommend certain tests to check your strength and balance while standing, walking, or changing positions. Foot health exam. Foot pain and numbness, as well as not wearing proper footwear, can make you more likely to have a fall. Screenings, including: Osteoporosis screening. Osteoporosis is a condition that causes the bones to get weaker and break more easily. Blood pressure screening. Blood pressure changes and medicines to control blood pressure can make you feel dizzy. Depression screening. You may be more likely to have a fall if you have a fear of falling, feel depressed, or feel unable to do activities that you used to do. Alcohol use screening. Using too much alcohol can affect your balance and may make you more likely to have a fall. Follow these instructions at home: Lifestyle Do not drink alcohol if: Your health care provider tells you not to drink. If you drink alcohol: Limit how much you have to: 0-1 drink a day for women. 0-2 drinks a day for men. Know how much alcohol is in your drink. In the [place] one drink equals one 12 oz bottle of beer (355 mL), one 5 oz glass of wine (148 mL), or one 1 oz glass of hard liquor (44 mL). Do not use any products that contain nicotine or tobacco. These products include cigarettes, chewing tobacco, and  vaping devices, such as e-cigarettes. If you need help quitting, ask your health care provider. Activity  Follow a regular exercise program to stay fit. This will help you maintain your balance. Ask your health care provider what types of exercise are appropriate for you. If you need a cane or walker, use it as recommended by your health care provider. Wear supportive shoes that have nonskid soles. Safety  Remove any tripping hazards, such as rugs, cords, and clutter. Install safety equipment such as grab bars in bathrooms and safety rails on stairs. Keep rooms and walkways well-lit. General instructions Talk with your health care provider about your risks for falling. Tell your health care provider if: You fall. Be sure to tell your health care provider about all falls, even ones that seem minor. You feel dizzy, tiredness (fatigue), or off-balance. Take over-the-counter and prescription medicines only as told by your health care provider. These include supplements. Eat a healthy diet and maintain a healthy weight. A healthy diet includes low-fat dairy products, low-fat (lean) meats, and fiber from whole grains, beans, and lots of fruits and vegetables. Stay current with your vaccines. Schedule regular health, dental, and eye exams. Summary Having a healthy lifestyle and getting preventive care can help to protect your health and wellness after age 60. Screening and testing are the best way to find a health problem early and help you avoid having a fall. Early diagnosis and treatment give you the  best chance for managing medical conditions that are more common for people who are older than age 54. Falls are a major cause of broken bones and head injuries in people who are older than age 70. Take precautions to prevent a fall at home. Work with your health care provider to learn what changes you can make to improve your health and wellness and to prevent falls. This information is not intended  to replace advice given to you by your health care provider. Make sure you discuss any questions you have with your health care provider. Document Revised: 04/30/2021 Document Reviewed: 04/30/2021 Elsevier Patient Education  Scalp Level, MD [facility]

## 2023-01-15 NOTE — Assessment & Plan Note (Signed)
Advised to stay well-hydrated and avoid NSAIDs. ?

## 2023-01-16 ENCOUNTER — Encounter: Payer: Self-pay | Admitting: Internal Medicine

## 2023-01-16 ENCOUNTER — Ambulatory Visit (INDEPENDENT_AMBULATORY_CARE_PROVIDER_SITE_OTHER): Payer: Medicare Other | Admitting: Internal Medicine

## 2023-01-16 VITALS — BP 122/76 | HR 65 | Temp 97.5°F | Ht 66.0 in | Wt 191.8 lb

## 2023-01-16 DIAGNOSIS — J4550 Severe persistent asthma, uncomplicated: Secondary | ICD-10-CM | POA: Diagnosis not present

## 2023-01-16 NOTE — Patient Instructions (Signed)
No change in medications   Work on inhaler technique:  relax and gently blow all the way out then take a nice smooth full deep breath back in, triggering the inhaler at same time you start breathing in.  Hold breath in for at least  5 seconds if you can. Blow out Symbicort 160  thru nose. Rinse and gargle with water when done.  If mouth or throat bother you at all,  try brushing teeth/gums/tongue with arm and hammer toothpaste/ make a slurry and gargle and spit out.   Please schedule a follow up visit in 6 months but call sooner if needed

## 2023-01-17 DIAGNOSIS — M62830 Muscle spasm of back: Secondary | ICD-10-CM | POA: Diagnosis not present

## 2023-01-17 DIAGNOSIS — M9901 Segmental and somatic dysfunction of cervical region: Secondary | ICD-10-CM | POA: Diagnosis not present

## 2023-01-17 DIAGNOSIS — M9902 Segmental and somatic dysfunction of thoracic region: Secondary | ICD-10-CM | POA: Diagnosis not present

## 2023-01-17 DIAGNOSIS — M9903 Segmental and somatic dysfunction of lumbar region: Secondary | ICD-10-CM | POA: Diagnosis not present

## 2023-01-17 NOTE — Assessment & Plan Note (Signed)
Onset in childhood  --chronic severe asthma with best FEV1 2.2 L documented 01/2001 and documented non-adherence Mainly lives off samples of inhalers and nose sprays  - Off allergy vaccines x7/2010 > highly allergic skin tests per Dr Annamaria Boots > restart vaccine November 06, 2009>stop 07/30/13 to watch - Spirometry on "good day" 01/14/12 =  FEV1 1.54  Ratio 0.58  - 02/16/2016 added prn prednisone when needing neb - FENO 04/23/16- 32  -CBC with differential 04/23/2016-eosinophils elevated 7%  - Spirometry 01/28/2017  FEV1 1.23 (49%)  Ratio 60    - Spirometry 08/11/2018  FEV1 1.43 (60%)  Ratio 60  - FENO 08/11/2018  =   30 on symb 160 2bid - 07/16/2022  After extensive coaching inhaler device,  effectiveness =   75% (delayed insp)   All goals of chronic asthma control met including optimal (though not nl)  function and elimination of symptoms with minimal need for rescue therapy and need for prednisone.  Contingencies discussed in full including contacting this office immediately if not controlling the symptoms using the rule of two's.     F/u 6 , sooner if needed          Each maintenance medication was reviewed in detail including emphasizing most importantly the difference between maintenance and prns and under what circumstances the prns are to be triggered using an action plan format where appropriate.  Total time for H and P, chart review, counseling, reviewing hfa/neb device(s) and generating customized AVS unique to this office visit / same day charting = 20 min

## 2023-01-20 DIAGNOSIS — M9903 Segmental and somatic dysfunction of lumbar region: Secondary | ICD-10-CM | POA: Diagnosis not present

## 2023-01-20 DIAGNOSIS — M62830 Muscle spasm of back: Secondary | ICD-10-CM | POA: Diagnosis not present

## 2023-01-20 DIAGNOSIS — M9902 Segmental and somatic dysfunction of thoracic region: Secondary | ICD-10-CM | POA: Diagnosis not present

## 2023-01-20 DIAGNOSIS — M9901 Segmental and somatic dysfunction of cervical region: Secondary | ICD-10-CM | POA: Diagnosis not present

## 2023-01-21 DIAGNOSIS — M62830 Muscle spasm of back: Secondary | ICD-10-CM | POA: Diagnosis not present

## 2023-01-21 DIAGNOSIS — M9902 Segmental and somatic dysfunction of thoracic region: Secondary | ICD-10-CM | POA: Diagnosis not present

## 2023-01-21 DIAGNOSIS — M9901 Segmental and somatic dysfunction of cervical region: Secondary | ICD-10-CM | POA: Diagnosis not present

## 2023-01-21 DIAGNOSIS — M9903 Segmental and somatic dysfunction of lumbar region: Secondary | ICD-10-CM | POA: Diagnosis not present

## 2023-01-27 DIAGNOSIS — M9901 Segmental and somatic dysfunction of cervical region: Secondary | ICD-10-CM | POA: Diagnosis not present

## 2023-01-27 DIAGNOSIS — M9903 Segmental and somatic dysfunction of lumbar region: Secondary | ICD-10-CM | POA: Diagnosis not present

## 2023-01-27 DIAGNOSIS — M9902 Segmental and somatic dysfunction of thoracic region: Secondary | ICD-10-CM | POA: Diagnosis not present

## 2023-01-27 DIAGNOSIS — M62830 Muscle spasm of back: Secondary | ICD-10-CM | POA: Diagnosis not present

## 2023-01-28 DIAGNOSIS — M9902 Segmental and somatic dysfunction of thoracic region: Secondary | ICD-10-CM | POA: Diagnosis not present

## 2023-01-28 DIAGNOSIS — M9901 Segmental and somatic dysfunction of cervical region: Secondary | ICD-10-CM | POA: Diagnosis not present

## 2023-01-28 DIAGNOSIS — M9903 Segmental and somatic dysfunction of lumbar region: Secondary | ICD-10-CM | POA: Diagnosis not present

## 2023-01-28 DIAGNOSIS — M62830 Muscle spasm of back: Secondary | ICD-10-CM | POA: Diagnosis not present

## 2023-01-29 DIAGNOSIS — M62830 Muscle spasm of back: Secondary | ICD-10-CM | POA: Diagnosis not present

## 2023-01-29 DIAGNOSIS — M9902 Segmental and somatic dysfunction of thoracic region: Secondary | ICD-10-CM | POA: Diagnosis not present

## 2023-01-29 DIAGNOSIS — M9901 Segmental and somatic dysfunction of cervical region: Secondary | ICD-10-CM | POA: Diagnosis not present

## 2023-01-29 DIAGNOSIS — M9903 Segmental and somatic dysfunction of lumbar region: Secondary | ICD-10-CM | POA: Diagnosis not present

## 2023-02-04 DIAGNOSIS — M9902 Segmental and somatic dysfunction of thoracic region: Secondary | ICD-10-CM | POA: Diagnosis not present

## 2023-02-04 DIAGNOSIS — M9901 Segmental and somatic dysfunction of cervical region: Secondary | ICD-10-CM | POA: Diagnosis not present

## 2023-02-04 DIAGNOSIS — M9903 Segmental and somatic dysfunction of lumbar region: Secondary | ICD-10-CM | POA: Diagnosis not present

## 2023-02-04 DIAGNOSIS — M62830 Muscle spasm of back: Secondary | ICD-10-CM | POA: Diagnosis not present

## 2023-02-11 ENCOUNTER — Ambulatory Visit: Payer: Medicare Other | Admitting: Emergency Medicine

## 2023-02-12 DIAGNOSIS — M62830 Muscle spasm of back: Secondary | ICD-10-CM | POA: Diagnosis not present

## 2023-02-12 DIAGNOSIS — M9901 Segmental and somatic dysfunction of cervical region: Secondary | ICD-10-CM | POA: Diagnosis not present

## 2023-02-12 DIAGNOSIS — M9902 Segmental and somatic dysfunction of thoracic region: Secondary | ICD-10-CM | POA: Diagnosis not present

## 2023-02-12 DIAGNOSIS — M9903 Segmental and somatic dysfunction of lumbar region: Secondary | ICD-10-CM | POA: Diagnosis not present

## 2023-02-19 ENCOUNTER — Other Ambulatory Visit: Payer: Self-pay | Admitting: Internal Medicine

## 2023-02-25 DIAGNOSIS — M9901 Segmental and somatic dysfunction of cervical region: Secondary | ICD-10-CM | POA: Diagnosis not present

## 2023-02-25 DIAGNOSIS — M9903 Segmental and somatic dysfunction of lumbar region: Secondary | ICD-10-CM | POA: Diagnosis not present

## 2023-02-25 DIAGNOSIS — M9902 Segmental and somatic dysfunction of thoracic region: Secondary | ICD-10-CM | POA: Diagnosis not present

## 2023-02-25 DIAGNOSIS — M62830 Muscle spasm of back: Secondary | ICD-10-CM | POA: Diagnosis not present

## 2023-03-03 ENCOUNTER — Ambulatory Visit (INDEPENDENT_AMBULATORY_CARE_PROVIDER_SITE_OTHER): Payer: Medicare Other | Admitting: Emergency Medicine

## 2023-03-03 ENCOUNTER — Encounter: Payer: Self-pay | Admitting: Emergency Medicine

## 2023-03-03 VITALS — BP 130/74 | HR 68 | Temp 97.6°F | Ht 66.0 in | Wt 187.4 lb

## 2023-03-03 DIAGNOSIS — I10 Essential (primary) hypertension: Secondary | ICD-10-CM | POA: Diagnosis not present

## 2023-03-03 DIAGNOSIS — N1831 Chronic kidney disease, stage 3a: Secondary | ICD-10-CM

## 2023-03-03 DIAGNOSIS — M62830 Muscle spasm of back: Secondary | ICD-10-CM | POA: Diagnosis not present

## 2023-03-03 DIAGNOSIS — J4550 Severe persistent asthma, uncomplicated: Secondary | ICD-10-CM

## 2023-03-03 DIAGNOSIS — M5431 Sciatica, right side: Secondary | ICD-10-CM | POA: Diagnosis not present

## 2023-03-03 DIAGNOSIS — R7303 Prediabetes: Secondary | ICD-10-CM

## 2023-03-03 DIAGNOSIS — E785 Hyperlipidemia, unspecified: Secondary | ICD-10-CM

## 2023-03-03 DIAGNOSIS — M9902 Segmental and somatic dysfunction of thoracic region: Secondary | ICD-10-CM | POA: Diagnosis not present

## 2023-03-03 DIAGNOSIS — M9903 Segmental and somatic dysfunction of lumbar region: Secondary | ICD-10-CM | POA: Diagnosis not present

## 2023-03-03 DIAGNOSIS — M9901 Segmental and somatic dysfunction of cervical region: Secondary | ICD-10-CM | POA: Diagnosis not present

## 2023-03-03 NOTE — Assessment & Plan Note (Signed)
Diet and nutrition discussed. 

## 2023-03-03 NOTE — Assessment & Plan Note (Signed)
Advised to stay well-hydrated and avoid NSAIDs. ?

## 2023-03-03 NOTE — Patient Instructions (Signed)
Sciatica  Sciatica is pain, weakness, tingling, or loss of feeling (numbness) along the sciatic nerve. The sciatic nerve starts in the lower back and goes down the back of each leg. Sciatica usually affects one side of the body. Sciatica usually goes away on its own or with treatment. Sometimes, sciatica may come back. What are the causes? This condition happens when the sciatic nerve is pinched or has pressure put on it. This may be caused by: A disk in between the bones of the spine bulging out too far (herniated disk). Changes in the spinal disks due to aging. A condition that affects a muscle in the butt. Extra bone growth near the sciatic nerve. A break (fracture) of the area between your hip bones (pelvis). Pregnancy. Tumor. This is rare. What increases the risk? You are more likely to develop this condition if you: Play sports that put pressure or stress on the spine. Have poor strength and ease of movement (flexibility). Have had a back injury or back surgery. Sit for long periods of time. Do activities that involve bending or lifting over and over again. Are very overweight (obese). What are the signs or symptoms? Symptoms can vary from mild to very bad. They may include: Any of these problems in the lower back, leg, hip, or butt: Mild tingling, loss of feeling, or dull aches. A burning feeling. Sharp pains. Loss of feeling in the back of the calf or the sole of the foot. Leg weakness. Very bad back pain that makes it hard to move. These symptoms may get worse when you cough, sneeze, or laugh. They may also get worse when you sit or stand for long periods of time. How is this treated? This condition often gets better without any treatment. However, treatment may include: Changing or cutting back on physical activity when you have pain. Exercising, including strengthening and stretching. Putting ice or heat on the affected area. Shots of medicines to relieve pain and  swelling or to relax your muscles. Surgery. Follow these instructions at home: Medicines Take over-the-counter and prescription medicines only as told by your doctor. Ask your doctor if you should avoid driving or using machines while you are taking your medicine. Managing pain     If told, put ice on the affected area. To do this: Put ice in a plastic bag. Place a towel between your skin and the bag. Leave the ice on for 20 minutes, 2-3 times a day. If your skin turns bright red, take off the ice right away to prevent skin damage. The risk of skin damage is higher if you cannot feel pain, heat, or cold. If told, put heat on the affected area. Do this as often as told by your doctor. Use the heat source that your doctor tells you to use, such as a moist heat pack or a heating pad. Place a towel between your skin and the heat source. Leave the heat on for 20-30 minutes. If your skin turns bright red, take off the heat right away to prevent burns. The risk of burns is higher if you cannot feel pain, heat, or cold. Activity  Return to your normal activities when your doctor says that it is safe. Avoid activities that make your symptoms worse. Take short rests during the day. When you rest for a long time, do some physical activity or stretching between periods of rest. Avoid sitting for a long time without moving. Get up and move around at least one time each   hour. Do exercises and stretches as told by your doctor. Do not lift anything that is heavier than 10 lb (4.5 kg). Avoid lifting heavy things even when you do not have symptoms. Avoid lifting heavy things over and over. When you lift objects, always lift in a way that is safe for your body. To do this, you should: Bend your knees. Keep the object close to your body. Avoid twisting. General instructions Stay at a healthy weight. Wear comfortable shoes that support your feet. Avoid wearing high heels. Avoid sleeping on a mattress  that is too soft or too hard. You might have less pain if you sleep on a mattress that is firm enough to support your back. Contact a doctor if: Your pain is not controlled by medicine. Your pain does not get better. Your pain gets worse. Your pain lasts longer than 4 weeks. You lose weight without trying. Get help right away if: You cannot control when you pee (urinate) or poop (have a bowel movement). You have weakness in any of these areas and it gets worse: Lower back. The area between your hip bones. Butt. Legs. You have redness or swelling of your back. You have a burning feeling when you pee. Summary Sciatica is pain, weakness, tingling, or loss of feeling (numbness) along the sciatic nerve. This may include the lower back, legs, hips, and butt. This condition happens when the sciatic nerve is pinched or has pressure put on it. Treatment often includes rest, exercise, medicines, and putting ice or heat on the affected area. This information is not intended to replace advice given to you by your health care provider. Make sure you discuss any questions you have with your health care provider. Document Revised: 03/18/2022 Document Reviewed: 03/18/2022 Elsevier Patient Education  2023 [employer]vier [employer]  

## 2023-03-03 NOTE — Assessment & Plan Note (Signed)
Well-controlled hypertension. Continue multipin 5 mg daily. BP Readings from Last 3 Encounters:  03/03/23 130/74  01/16/23 122/76  01/15/23 136/80

## 2023-03-03 NOTE — Assessment & Plan Note (Signed)
Stable chronic condition.  Not taking medication at present time.  Diet controlled.

## 2023-03-03 NOTE — Progress Notes (Signed)
[name] 82 y.o.   Chief Complaint  Patient presents with   Follow-up    36mth f/u appt, right leg pain, burning sensation on the top of right foot     HISTORY OF PRESENT ILLNESS: Here for 62-monthollow-up of chronic medical conditions. This is a [age].o. male complaining of pain to the right leg with burning sensation to the top of the right foot.  States pain starts in lumbar area radiating down back of his right leg all the way down to ankle and foot area.  At times pain starts in the right foot and travels up No other associated symptoms.  Denies injury. No other needs or medical concerns today.  HPI   Prior to Admission medications   Medication Sig Start Date End Date Taking? Authorizing Provider  albuterol (PROVENTIL) (2.5 MG/3ML) 0.083% nebulizer solution USE 1 VIAL IN NEBULIZER EVERY 4 HOURS AS NEEDED 09/02/22  Yes Ameir Faria, MiInes BloomerMD  amLODipine (NORVASC) 5 MG tablet Take 1 tablet (5 mg total) by mouth daily. 05/08/22  Yes Bruin Bolger, Ines BloomerMD  aspirin 81 MG EC tablet Take 81 mg by mouth every morning.   Yes [provider]  budesonide-formoterol (SYMBICORT) 160-4.5 MCG/ACT inhaler TAKE 2 PUFFS FIRST THING IN AM AND THEN ANOTHER 2 PUFFS ABOUT 12 HOURS LATER. 02/20/23  Yes WeTanda RockersMD  CHELATED MAGNESIUM PO Take 1 tablet by mouth daily.   Yes [provider]  cholecalciferol (VITAMIN D3) 25 MCG (1000 UT) tablet Take 1,000 Units by mouth daily.   Yes [provider]  cyanocobalamin 1000 MCG tablet Take 1,000 mcg by mouth daily.   Yes [provider]  famotidine (PEPCID) 20 MG tablet Take 20 mg by mouth daily.   Yes [provider]  Ibuprofen 200 MG CAPS Take 200 mg by mouth every 6 (six) hours as needed (headache/pain).    Yes [provider]  meclizine (ANTIVERT) 12.5 MG tablet TAKE 1 TABLET BY MOUTH 4 TIMES A DAY AS NEEDED FOR DIZZINESS 11/22/22  Yes WeTanda RockersMD  oxymetazoline (AFRIN) 0.05 %  nasal spray Place 1 spray into both nostrils 2 (two) times daily as needed for congestion.   Yes [provider]  Polyvinyl Alcohol-Povidone (REFRESH OP) Apply 1 drop to eye as needed (dry eyes).    Yes [provider]  Ascorbic Acid (VITAMIN C) 1000 MG tablet Take 1,000 mg by mouth daily.    [provider]  guaifenesin (HUMIBID E) 400 MG TABS tablet Take 400 mg by mouth every 4 (four) hours as needed. Patient not taking: Reported on 01/16/2023    [provider]  predniSONE (DELTASONE) 10 MG tablet 2 DAILY UNTIL BETTER, THEN 1 X 3 DAYS AND STOP Patient not taking: Reported on 01/16/2023 11/19/21   WeTanda RockersMD  Respiratory Therapy Supplies (FLUTTER) DEVI Use every 4 hours as needed for cough and congestion    [provider]    No Known Allergies  Patient Active Problem List   Diagnosis Date Noted   Paresthesia of both feet 01/15/2023   Prediabetes 01/15/2023   Stage 3a chronic kidney disease ([place]/12/2016   Hx of adenomatous colonic polyps 06/25/2015   Esophageal stricture    Essential hypertension 09/05/2011   Dyslipidemia 08/08/2011   Esophageal dysphagia 03/12/2011   GERD 11/06/2009   Seasonal and perennial allergic rhinitis 05/31/2009   Severe persistent asthma 01/04/2008    Past Medical History:  Diagnosis Date  Acid reflux    Asthma 06/18/2011   Borderline hypertension    Cataract    Colon polyps 2011   1 Hyperplastic and 1 Tubular Adenoma    Dysphagia    Esophageal stricture    GERD (gastroesophageal reflux disease)    Hx of adenomatous colonic polyps 06/25/2015   Personal history of colonic polyps 12/1989   Shingles    Shoulder pain, right    Unspecified asthma(493.90)    Urinary frequency     Past Surgical History:  Procedure Laterality Date   BALLOON DILATION N/A 02/07/2015   Procedure: BALLOON DILATION;  Surgeon: Gatha Mayer, MD;  Location: [facility];  Service: Endoscopy;  Laterality: N/A;    ESOPHAGOGASTRODUODENOSCOPY (EGD) WITH PROPOFOL N/A 02/07/2015   Procedure: ESOPHAGOGASTRODUODENOSCOPY (EGD) WITH PROPOFOL;  Surgeon: Gatha Mayer, MD;  Location: [facility];  Service: Endoscopy;  Laterality: N/A;   HEMORRHOID SURGERY     HERNIA REPAIR     MASS EXCISION N/A 08/14/2016   Procedure: EXCISION SEBACEOUS CYST BACK;  Surgeon: Johnathan Hausen, MD;  Location: [facility];  Service: General;  Laterality: N/A;  EXCISION SEBACEOUS CYST BACK   TONSILLECTOMY      Social History   Socioeconomic History   Marital status: Married    Spouse name: Not on file   Number of children: 7   Years of education: Not on file   Highest education level: Not on file  Occupational History   Occupation: Truck Geophysicist/field seismologist    Comment: retired    Fish farm manager: RETIRED  Tobacco Use   Smoking status: Never    Passive exposure: Never   Smokeless tobacco: Never  Vaping Use   Vaping Use: Never used  Substance and Sexual Activity   Alcohol use: No   Drug use: No   Sexual activity: Not on file  Other Topics Concern   Not on file  Social History Narrative   Patient is separated from spouse   Social Determinants of Health   Financial Resource Strain: Not on file  Food Insecurity: Not on file  Transportation Needs: Not on file  Physical Activity: Not on file  Stress: Not on file  Social Connections: Not on file  Intimate Partner Violence: Not on file    Family History  Problem Relation Age of Onset   Heart disease Mother    Kidney failure Sister    Breast cancer Sister    Lung cancer Brother    Brain cancer Sister    Asthma Sister    Colon cancer Neg Hx      Review of Systems  Constitutional: Negative.  Negative for chills and fever.  HENT: Negative.  Negative for congestion and sore throat.   Respiratory: Negative.  Negative for cough and shortness of breath.   Cardiovascular: Negative.  Negative for chest pain and palpitations.  Gastrointestinal: Negative.  Negative for  abdominal pain, diarrhea, nausea and vomiting.  Genitourinary: Negative.  Negative for dysuria and hematuria.  Musculoskeletal:  Positive for back pain.  Skin: Negative.  Negative for rash.  Neurological: Negative.  Negative for dizziness and headaches.  All other systems reviewed and are negative.  Today's Vitals   03/03/23 1048  BP: 130/74  Pulse: 68  Temp: 97.6 F (36.4 C)  TempSrc: Oral  SpO2: 94%  Weight: 187 lb 6 oz (85 kg)  Height: '5\' 6"'$  (1.676 m)   Body mass index is 30.24 kg/m.   Physical Exam Vitals reviewed.  Constitutional:  Appearance: Normal appearance.  HENT:     Head: Normocephalic.     Right Ear: Tympanic membrane, ear canal and external ear normal.     Left Ear: Tympanic membrane, ear canal and external ear normal.     Mouth/Throat:     Mouth: Mucous membranes are moist.     Pharynx: Oropharynx is clear.  Eyes:     Extraocular Movements: Extraocular movements intact.     Pupils: Pupils are equal, round, and reactive to light.  Cardiovascular:     Rate and Rhythm: Normal rate and regular rhythm.     Pulses: Normal pulses.     Heart sounds: Normal heart sounds.  Pulmonary:     Effort: Pulmonary effort is normal.     Breath sounds: Normal breath sounds.  Abdominal:     Palpations: Abdomen is soft.     Tenderness: There is no abdominal tenderness.  Musculoskeletal:        General: Normal range of motion.     Cervical back: No tenderness.     Right lower leg: No edema.     Left lower leg: No edema.     Comments: Feet: Warm to touch.  Good peripheral pulses.  Good distal sensation.  No tenderness.  Full range of motion.  Onychomycosis both feet  Lymphadenopathy:     Cervical: No cervical adenopathy.  Skin:    General: Skin is warm and dry.     Comments: Onychomycosis both feet  Neurological:     General: No focal deficit present.     Mental Status: He is alert and oriented to person, place, and time.  Psychiatric:        Mood and Affect:  Mood normal.        Behavior: Behavior normal.      ASSESSMENT & PLAN: Problem List Items Addressed This Visit       Cardiovascular and Mediastinum   Essential hypertension    Well-controlled hypertension. Continue multipin 5 mg daily. BP Readings from Last 3 Encounters:  03/03/23 130/74  01/16/23 122/76  01/15/23 136/80          Respiratory   Severe persistent asthma    Much improved and asymptomatic at present time.  Continues Symbicort 2 puffs twice a day        Nervous and Auditory   Sciatica, right side - Primary    Differential diagnosis discussed. Mechanical in nature.  No red flag signs or symptoms. Benefits of exercise discussed. Intermittent and not affecting quality of life. Pain management discussed.        Genitourinary   Stage 3a chronic kidney disease ([place])    Advised to stay well-hydrated and avoid NSAIDs        Other   Dyslipidemia    Stable chronic condition.  Not taking medication at present time.  Diet controlled.      Prediabetes    Diet and nutrition discussed.      Patient Instructions  Sciatica  Sciatica is pain, weakness, tingling, or loss of feeling (numbness) along the sciatic nerve. The sciatic nerve starts in the lower back and goes down the back of each leg. Sciatica usually affects one side of the body. Sciatica usually goes away on its own or with treatment. Sometimes, sciatica may come back. What are the causes? This condition happens when the sciatic nerve is pinched or has pressure put on it. This may be caused by: A disk in between the bones of the spine bulging  out too far (herniated disk). Changes in the spinal disks due to aging. A condition that affects a muscle in the butt. Extra bone growth near the sciatic nerve. A break (fracture) of the area between your hip bones (pelvis). Pregnancy. Tumor. This is rare. What increases the risk? You are more likely to develop this condition if you: Play sports that put  pressure or stress on the spine. Have poor strength and ease of movement (flexibility). Have had a back injury or back surgery. Sit for long periods of time. Do activities that involve bending or lifting over and over again. Are very overweight (obese). What are the signs or symptoms? Symptoms can vary from mild to very bad. They may include: Any of these problems in the lower back, leg, hip, or butt: Mild tingling, loss of feeling, or dull aches. A burning feeling. Sharp pains. Loss of feeling in the back of the calf or the sole of the foot. Leg weakness. Very bad back pain that makes it hard to move. These symptoms may get worse when you cough, sneeze, or laugh. They may also get worse when you sit or stand for long periods of time. How is this treated? This condition often gets better without any treatment. However, treatment may include: Changing or cutting back on physical activity when you have pain. Exercising, including strengthening and stretching. Putting ice or heat on the affected area. Shots of medicines to relieve pain and swelling or to relax your muscles. Surgery. Follow these instructions at home: Medicines Take over-the-counter and prescription medicines only as told by your doctor. Ask your doctor if you should avoid driving or using machines while you are taking your medicine. Managing pain     If told, put ice on the affected area. To do this: Put ice in a plastic bag. Place a towel between your skin and the bag. Leave the ice on for 20 minutes, 2-3 times a day. If your skin turns bright red, take off the ice right away to prevent skin damage. The risk of skin damage is higher if you cannot feel pain, heat, or cold. If told, put heat on the affected area. Do this as often as told by your doctor. Use the heat source that your doctor tells you to use, such as a moist heat pack or a heating pad. Place a towel between your skin and the heat source. Leave the  heat on for 20-30 minutes. If your skin turns bright red, take off the heat right away to prevent burns. The risk of burns is higher if you cannot feel pain, heat, or cold. Activity  Return to your normal activities when your doctor says that it is safe. Avoid activities that make your symptoms worse. Take short rests during the day. When you rest for a long time, do some physical activity or stretching between periods of rest. Avoid sitting for a long time without moving. Get up and move around at least one time each hour. Do exercises and stretches as told by your doctor. Do not lift anything that is heavier than 10 lb (4.5 kg). Avoid lifting heavy things even when you do not have symptoms. Avoid lifting heavy things over and over. When you lift objects, always lift in a way that is safe for your body. To do this, you should: Bend your knees. Keep the object close to your body. Avoid twisting. General instructions Stay at a healthy weight. Wear comfortable shoes that support your feet. Avoid wearing  high heels. Avoid sleeping on a mattress that is too soft or too hard. You might have less pain if you sleep on a mattress that is firm enough to support your back. Contact a doctor if: Your pain is not controlled by medicine. Your pain does not get better. Your pain gets worse. Your pain lasts longer than 4 weeks. You lose weight without trying. Get help right away if: You cannot control when you pee (urinate) or poop (have a bowel movement). You have weakness in any of these areas and it gets worse: Lower back. The area between your hip bones. Butt. Legs. You have redness or swelling of your back. You have a burning feeling when you pee. Summary Sciatica is pain, weakness, tingling, or loss of feeling (numbness) along the sciatic nerve. This may include the lower back, legs, hips, and butt. This condition happens when the sciatic nerve is pinched or has pressure put on  it. Treatment often includes rest, exercise, medicines, and putting ice or heat on the affected area. This information is not intended to replace advice given to you by your health care provider. Make sure you discuss any questions you have with your health care provider. Document Revised: 03/18/2022 Document Reviewed: 03/18/2022 Elsevier Patient Education  [place], MD [facility]

## 2023-03-03 NOTE — Assessment & Plan Note (Signed)
Differential diagnosis discussed. Mechanical in nature.  No red flag signs or symptoms. Benefits of exercise discussed. Intermittent and not affecting quality of life. Pain management discussed.

## 2023-03-03 NOTE — Assessment & Plan Note (Signed)
Much improved and asymptomatic at present time.  Continues Symbicort 2 puffs twice a day

## 2023-03-05 DIAGNOSIS — M9902 Segmental and somatic dysfunction of thoracic region: Secondary | ICD-10-CM | POA: Diagnosis not present

## 2023-03-05 DIAGNOSIS — M9901 Segmental and somatic dysfunction of cervical region: Secondary | ICD-10-CM | POA: Diagnosis not present

## 2023-03-05 DIAGNOSIS — M9903 Segmental and somatic dysfunction of lumbar region: Secondary | ICD-10-CM | POA: Diagnosis not present

## 2023-03-05 DIAGNOSIS — M62830 Muscle spasm of back: Secondary | ICD-10-CM | POA: Diagnosis not present

## 2023-03-17 DIAGNOSIS — M62830 Muscle spasm of back: Secondary | ICD-10-CM | POA: Diagnosis not present

## 2023-03-17 DIAGNOSIS — M9902 Segmental and somatic dysfunction of thoracic region: Secondary | ICD-10-CM | POA: Diagnosis not present

## 2023-03-17 DIAGNOSIS — M9903 Segmental and somatic dysfunction of lumbar region: Secondary | ICD-10-CM | POA: Diagnosis not present

## 2023-03-17 DIAGNOSIS — M9901 Segmental and somatic dysfunction of cervical region: Secondary | ICD-10-CM | POA: Diagnosis not present

## 2023-03-19 DIAGNOSIS — M9903 Segmental and somatic dysfunction of lumbar region: Secondary | ICD-10-CM | POA: Diagnosis not present

## 2023-03-19 DIAGNOSIS — M9902 Segmental and somatic dysfunction of thoracic region: Secondary | ICD-10-CM | POA: Diagnosis not present

## 2023-03-19 DIAGNOSIS — M62830 Muscle spasm of back: Secondary | ICD-10-CM | POA: Diagnosis not present

## 2023-03-19 DIAGNOSIS — M9901 Segmental and somatic dysfunction of cervical region: Secondary | ICD-10-CM | POA: Diagnosis not present

## 2023-03-20 ENCOUNTER — Other Ambulatory Visit: Payer: Self-pay | Admitting: Emergency Medicine

## 2023-03-20 DIAGNOSIS — I10 Essential (primary) hypertension: Secondary | ICD-10-CM

## 2023-03-28 DIAGNOSIS — M9903 Segmental and somatic dysfunction of lumbar region: Secondary | ICD-10-CM | POA: Diagnosis not present

## 2023-03-28 DIAGNOSIS — M62830 Muscle spasm of back: Secondary | ICD-10-CM | POA: Diagnosis not present

## 2023-03-28 DIAGNOSIS — M9902 Segmental and somatic dysfunction of thoracic region: Secondary | ICD-10-CM | POA: Diagnosis not present

## 2023-03-28 DIAGNOSIS — M9901 Segmental and somatic dysfunction of cervical region: Secondary | ICD-10-CM | POA: Diagnosis not present

## 2023-04-02 DIAGNOSIS — M62830 Muscle spasm of back: Secondary | ICD-10-CM | POA: Diagnosis not present

## 2023-04-02 DIAGNOSIS — M9903 Segmental and somatic dysfunction of lumbar region: Secondary | ICD-10-CM | POA: Diagnosis not present

## 2023-04-02 DIAGNOSIS — M9901 Segmental and somatic dysfunction of cervical region: Secondary | ICD-10-CM | POA: Diagnosis not present

## 2023-04-02 DIAGNOSIS — M9902 Segmental and somatic dysfunction of thoracic region: Secondary | ICD-10-CM | POA: Diagnosis not present

## 2023-04-03 DIAGNOSIS — M9901 Segmental and somatic dysfunction of cervical region: Secondary | ICD-10-CM | POA: Diagnosis not present

## 2023-04-03 DIAGNOSIS — M9903 Segmental and somatic dysfunction of lumbar region: Secondary | ICD-10-CM | POA: Diagnosis not present

## 2023-04-03 DIAGNOSIS — M62830 Muscle spasm of back: Secondary | ICD-10-CM | POA: Diagnosis not present

## 2023-04-03 DIAGNOSIS — M9902 Segmental and somatic dysfunction of thoracic region: Secondary | ICD-10-CM | POA: Diagnosis not present

## 2023-04-08 DIAGNOSIS — M9902 Segmental and somatic dysfunction of thoracic region: Secondary | ICD-10-CM | POA: Diagnosis not present

## 2023-04-08 DIAGNOSIS — M9901 Segmental and somatic dysfunction of cervical region: Secondary | ICD-10-CM | POA: Diagnosis not present

## 2023-04-08 DIAGNOSIS — M9903 Segmental and somatic dysfunction of lumbar region: Secondary | ICD-10-CM | POA: Diagnosis not present

## 2023-04-08 DIAGNOSIS — M62830 Muscle spasm of back: Secondary | ICD-10-CM | POA: Diagnosis not present

## 2023-04-09 DIAGNOSIS — M9901 Segmental and somatic dysfunction of cervical region: Secondary | ICD-10-CM | POA: Diagnosis not present

## 2023-04-09 DIAGNOSIS — M9903 Segmental and somatic dysfunction of lumbar region: Secondary | ICD-10-CM | POA: Diagnosis not present

## 2023-04-09 DIAGNOSIS — M62830 Muscle spasm of back: Secondary | ICD-10-CM | POA: Diagnosis not present

## 2023-04-09 DIAGNOSIS — M9902 Segmental and somatic dysfunction of thoracic region: Secondary | ICD-10-CM | POA: Diagnosis not present

## 2023-04-14 DIAGNOSIS — M62830 Muscle spasm of back: Secondary | ICD-10-CM | POA: Diagnosis not present

## 2023-04-14 DIAGNOSIS — M9902 Segmental and somatic dysfunction of thoracic region: Secondary | ICD-10-CM | POA: Diagnosis not present

## 2023-04-14 DIAGNOSIS — M9901 Segmental and somatic dysfunction of cervical region: Secondary | ICD-10-CM | POA: Diagnosis not present

## 2023-04-14 DIAGNOSIS — M9903 Segmental and somatic dysfunction of lumbar region: Secondary | ICD-10-CM | POA: Diagnosis not present

## 2023-04-15 DIAGNOSIS — M9902 Segmental and somatic dysfunction of thoracic region: Secondary | ICD-10-CM | POA: Diagnosis not present

## 2023-04-15 DIAGNOSIS — M62830 Muscle spasm of back: Secondary | ICD-10-CM | POA: Diagnosis not present

## 2023-04-15 DIAGNOSIS — M9903 Segmental and somatic dysfunction of lumbar region: Secondary | ICD-10-CM | POA: Diagnosis not present

## 2023-04-15 DIAGNOSIS — M9901 Segmental and somatic dysfunction of cervical region: Secondary | ICD-10-CM | POA: Diagnosis not present

## 2023-04-16 DIAGNOSIS — M9903 Segmental and somatic dysfunction of lumbar region: Secondary | ICD-10-CM | POA: Diagnosis not present

## 2023-04-16 DIAGNOSIS — M9902 Segmental and somatic dysfunction of thoracic region: Secondary | ICD-10-CM | POA: Diagnosis not present

## 2023-04-16 DIAGNOSIS — M9901 Segmental and somatic dysfunction of cervical region: Secondary | ICD-10-CM | POA: Diagnosis not present

## 2023-04-16 DIAGNOSIS — M62830 Muscle spasm of back: Secondary | ICD-10-CM | POA: Diagnosis not present

## 2023-04-21 DIAGNOSIS — M9903 Segmental and somatic dysfunction of lumbar region: Secondary | ICD-10-CM | POA: Diagnosis not present

## 2023-04-21 DIAGNOSIS — M62830 Muscle spasm of back: Secondary | ICD-10-CM | POA: Diagnosis not present

## 2023-04-21 DIAGNOSIS — M9901 Segmental and somatic dysfunction of cervical region: Secondary | ICD-10-CM | POA: Diagnosis not present

## 2023-04-21 DIAGNOSIS — M9902 Segmental and somatic dysfunction of thoracic region: Secondary | ICD-10-CM | POA: Diagnosis not present

## 2023-04-22 DIAGNOSIS — M9902 Segmental and somatic dysfunction of thoracic region: Secondary | ICD-10-CM | POA: Diagnosis not present

## 2023-04-22 DIAGNOSIS — M9903 Segmental and somatic dysfunction of lumbar region: Secondary | ICD-10-CM | POA: Diagnosis not present

## 2023-04-22 DIAGNOSIS — M62830 Muscle spasm of back: Secondary | ICD-10-CM | POA: Diagnosis not present

## 2023-04-22 DIAGNOSIS — M9901 Segmental and somatic dysfunction of cervical region: Secondary | ICD-10-CM | POA: Diagnosis not present

## 2023-04-23 DIAGNOSIS — M9902 Segmental and somatic dysfunction of thoracic region: Secondary | ICD-10-CM | POA: Diagnosis not present

## 2023-04-23 DIAGNOSIS — M9901 Segmental and somatic dysfunction of cervical region: Secondary | ICD-10-CM | POA: Diagnosis not present

## 2023-04-23 DIAGNOSIS — M9903 Segmental and somatic dysfunction of lumbar region: Secondary | ICD-10-CM | POA: Diagnosis not present

## 2023-04-23 DIAGNOSIS — M62830 Muscle spasm of back: Secondary | ICD-10-CM | POA: Diagnosis not present

## 2023-04-30 DIAGNOSIS — M62830 Muscle spasm of back: Secondary | ICD-10-CM | POA: Diagnosis not present

## 2023-04-30 DIAGNOSIS — M9901 Segmental and somatic dysfunction of cervical region: Secondary | ICD-10-CM | POA: Diagnosis not present

## 2023-04-30 DIAGNOSIS — M9902 Segmental and somatic dysfunction of thoracic region: Secondary | ICD-10-CM | POA: Diagnosis not present

## 2023-04-30 DIAGNOSIS — M9903 Segmental and somatic dysfunction of lumbar region: Secondary | ICD-10-CM | POA: Diagnosis not present

## 2023-05-02 DIAGNOSIS — M62830 Muscle spasm of back: Secondary | ICD-10-CM | POA: Diagnosis not present

## 2023-05-02 DIAGNOSIS — M9902 Segmental and somatic dysfunction of thoracic region: Secondary | ICD-10-CM | POA: Diagnosis not present

## 2023-05-02 DIAGNOSIS — M9903 Segmental and somatic dysfunction of lumbar region: Secondary | ICD-10-CM | POA: Diagnosis not present

## 2023-05-02 DIAGNOSIS — M9901 Segmental and somatic dysfunction of cervical region: Secondary | ICD-10-CM | POA: Diagnosis not present

## 2023-05-07 DIAGNOSIS — M62830 Muscle spasm of back: Secondary | ICD-10-CM | POA: Diagnosis not present

## 2023-05-07 DIAGNOSIS — M9902 Segmental and somatic dysfunction of thoracic region: Secondary | ICD-10-CM | POA: Diagnosis not present

## 2023-05-07 DIAGNOSIS — M9901 Segmental and somatic dysfunction of cervical region: Secondary | ICD-10-CM | POA: Diagnosis not present

## 2023-05-07 DIAGNOSIS — M9903 Segmental and somatic dysfunction of lumbar region: Secondary | ICD-10-CM | POA: Diagnosis not present

## 2023-05-23 DIAGNOSIS — M62830 Muscle spasm of back: Secondary | ICD-10-CM | POA: Diagnosis not present

## 2023-05-23 DIAGNOSIS — M9902 Segmental and somatic dysfunction of thoracic region: Secondary | ICD-10-CM | POA: Diagnosis not present

## 2023-05-23 DIAGNOSIS — M9901 Segmental and somatic dysfunction of cervical region: Secondary | ICD-10-CM | POA: Diagnosis not present

## 2023-05-23 DIAGNOSIS — M9903 Segmental and somatic dysfunction of lumbar region: Secondary | ICD-10-CM | POA: Diagnosis not present

## 2023-05-26 DIAGNOSIS — M9903 Segmental and somatic dysfunction of lumbar region: Secondary | ICD-10-CM | POA: Diagnosis not present

## 2023-05-26 DIAGNOSIS — M9902 Segmental and somatic dysfunction of thoracic region: Secondary | ICD-10-CM | POA: Diagnosis not present

## 2023-05-26 DIAGNOSIS — M62830 Muscle spasm of back: Secondary | ICD-10-CM | POA: Diagnosis not present

## 2023-05-26 DIAGNOSIS — M9901 Segmental and somatic dysfunction of cervical region: Secondary | ICD-10-CM | POA: Diagnosis not present

## 2023-05-28 DIAGNOSIS — M62830 Muscle spasm of back: Secondary | ICD-10-CM | POA: Diagnosis not present

## 2023-05-28 DIAGNOSIS — M9901 Segmental and somatic dysfunction of cervical region: Secondary | ICD-10-CM | POA: Diagnosis not present

## 2023-05-28 DIAGNOSIS — M9903 Segmental and somatic dysfunction of lumbar region: Secondary | ICD-10-CM | POA: Diagnosis not present

## 2023-05-28 DIAGNOSIS — M9902 Segmental and somatic dysfunction of thoracic region: Secondary | ICD-10-CM | POA: Diagnosis not present

## 2023-06-02 DIAGNOSIS — M9901 Segmental and somatic dysfunction of cervical region: Secondary | ICD-10-CM | POA: Diagnosis not present

## 2023-06-02 DIAGNOSIS — M9903 Segmental and somatic dysfunction of lumbar region: Secondary | ICD-10-CM | POA: Diagnosis not present

## 2023-06-02 DIAGNOSIS — M9902 Segmental and somatic dysfunction of thoracic region: Secondary | ICD-10-CM | POA: Diagnosis not present

## 2023-06-02 DIAGNOSIS — M62830 Muscle spasm of back: Secondary | ICD-10-CM | POA: Diagnosis not present

## 2023-06-03 DIAGNOSIS — M9903 Segmental and somatic dysfunction of lumbar region: Secondary | ICD-10-CM | POA: Diagnosis not present

## 2023-06-03 DIAGNOSIS — M62830 Muscle spasm of back: Secondary | ICD-10-CM | POA: Diagnosis not present

## 2023-06-03 DIAGNOSIS — M9902 Segmental and somatic dysfunction of thoracic region: Secondary | ICD-10-CM | POA: Diagnosis not present

## 2023-06-03 DIAGNOSIS — M9901 Segmental and somatic dysfunction of cervical region: Secondary | ICD-10-CM | POA: Diagnosis not present

## 2023-06-06 DIAGNOSIS — M9903 Segmental and somatic dysfunction of lumbar region: Secondary | ICD-10-CM | POA: Diagnosis not present

## 2023-06-06 DIAGNOSIS — M9902 Segmental and somatic dysfunction of thoracic region: Secondary | ICD-10-CM | POA: Diagnosis not present

## 2023-06-06 DIAGNOSIS — M9901 Segmental and somatic dysfunction of cervical region: Secondary | ICD-10-CM | POA: Diagnosis not present

## 2023-06-06 DIAGNOSIS — M62830 Muscle spasm of back: Secondary | ICD-10-CM | POA: Diagnosis not present

## 2023-06-09 DIAGNOSIS — M9903 Segmental and somatic dysfunction of lumbar region: Secondary | ICD-10-CM | POA: Diagnosis not present

## 2023-06-09 DIAGNOSIS — M62830 Muscle spasm of back: Secondary | ICD-10-CM | POA: Diagnosis not present

## 2023-06-09 DIAGNOSIS — M9901 Segmental and somatic dysfunction of cervical region: Secondary | ICD-10-CM | POA: Diagnosis not present

## 2023-06-09 DIAGNOSIS — M9902 Segmental and somatic dysfunction of thoracic region: Secondary | ICD-10-CM | POA: Diagnosis not present

## 2023-06-13 DIAGNOSIS — M9903 Segmental and somatic dysfunction of lumbar region: Secondary | ICD-10-CM | POA: Diagnosis not present

## 2023-06-13 DIAGNOSIS — M9902 Segmental and somatic dysfunction of thoracic region: Secondary | ICD-10-CM | POA: Diagnosis not present

## 2023-06-13 DIAGNOSIS — M62830 Muscle spasm of back: Secondary | ICD-10-CM | POA: Diagnosis not present

## 2023-06-13 DIAGNOSIS — M9901 Segmental and somatic dysfunction of cervical region: Secondary | ICD-10-CM | POA: Diagnosis not present

## 2023-06-16 DIAGNOSIS — M9901 Segmental and somatic dysfunction of cervical region: Secondary | ICD-10-CM | POA: Diagnosis not present

## 2023-06-16 DIAGNOSIS — M62830 Muscle spasm of back: Secondary | ICD-10-CM | POA: Diagnosis not present

## 2023-06-16 DIAGNOSIS — M9902 Segmental and somatic dysfunction of thoracic region: Secondary | ICD-10-CM | POA: Diagnosis not present

## 2023-06-16 DIAGNOSIS — M9903 Segmental and somatic dysfunction of lumbar region: Secondary | ICD-10-CM | POA: Diagnosis not present

## 2023-06-17 DIAGNOSIS — M9901 Segmental and somatic dysfunction of cervical region: Secondary | ICD-10-CM | POA: Diagnosis not present

## 2023-06-17 DIAGNOSIS — M62830 Muscle spasm of back: Secondary | ICD-10-CM | POA: Diagnosis not present

## 2023-06-17 DIAGNOSIS — M9903 Segmental and somatic dysfunction of lumbar region: Secondary | ICD-10-CM | POA: Diagnosis not present

## 2023-06-17 DIAGNOSIS — M9902 Segmental and somatic dysfunction of thoracic region: Secondary | ICD-10-CM | POA: Diagnosis not present

## 2023-06-18 DIAGNOSIS — M9901 Segmental and somatic dysfunction of cervical region: Secondary | ICD-10-CM | POA: Diagnosis not present

## 2023-06-18 DIAGNOSIS — M62830 Muscle spasm of back: Secondary | ICD-10-CM | POA: Diagnosis not present

## 2023-06-18 DIAGNOSIS — M9902 Segmental and somatic dysfunction of thoracic region: Secondary | ICD-10-CM | POA: Diagnosis not present

## 2023-06-18 DIAGNOSIS — M9903 Segmental and somatic dysfunction of lumbar region: Secondary | ICD-10-CM | POA: Diagnosis not present

## 2023-06-19 ENCOUNTER — Telehealth: Payer: Self-pay | Admitting: Emergency Medicine

## 2023-06-19 DIAGNOSIS — M62830 Muscle spasm of back: Secondary | ICD-10-CM | POA: Diagnosis not present

## 2023-06-19 DIAGNOSIS — M9902 Segmental and somatic dysfunction of thoracic region: Secondary | ICD-10-CM | POA: Diagnosis not present

## 2023-06-19 DIAGNOSIS — M9903 Segmental and somatic dysfunction of lumbar region: Secondary | ICD-10-CM | POA: Diagnosis not present

## 2023-06-19 DIAGNOSIS — M9901 Segmental and somatic dysfunction of cervical region: Secondary | ICD-10-CM | POA: Diagnosis not present

## 2023-06-19 MED ORDER — MECLIZINE HCL 12.5 MG PO TABS: ORAL_TABLET | ORAL | 3 refills | Status: AC

## 2023-06-19 NOTE — Telephone Encounter (Signed)
Prescription Request  06/19/2023  LOV: 03/03/2023  What is the name of the medication or equipment? meclizine (ANTIVERT) 12.5 MG tablet   Have you contacted your pharmacy to request a refill? No   Which pharmacy would you like this sent to?  [id number] - [place], [place] - [address] AT [facility] [address] [place] [address] Phone: [phone] Fax: [phone]    Patient notified that their request is being sent to the clinical staff for review and that they should receive a response within 2 business days.   Please advise at [facility] [phone]

## 2023-06-19 NOTE — Telephone Encounter (Signed)
[email or web address] 

## 2023-06-23 DIAGNOSIS — M9902 Segmental and somatic dysfunction of thoracic region: Secondary | ICD-10-CM | POA: Diagnosis not present

## 2023-06-23 DIAGNOSIS — M9901 Segmental and somatic dysfunction of cervical region: Secondary | ICD-10-CM | POA: Diagnosis not present

## 2023-06-23 DIAGNOSIS — M62830 Muscle spasm of back: Secondary | ICD-10-CM | POA: Diagnosis not present

## 2023-06-23 DIAGNOSIS — M9903 Segmental and somatic dysfunction of lumbar region: Secondary | ICD-10-CM | POA: Diagnosis not present

## 2023-06-24 DIAGNOSIS — M9901 Segmental and somatic dysfunction of cervical region: Secondary | ICD-10-CM | POA: Diagnosis not present

## 2023-06-24 DIAGNOSIS — M62830 Muscle spasm of back: Secondary | ICD-10-CM | POA: Diagnosis not present

## 2023-06-24 DIAGNOSIS — M9902 Segmental and somatic dysfunction of thoracic region: Secondary | ICD-10-CM | POA: Diagnosis not present

## 2023-06-24 DIAGNOSIS — M9903 Segmental and somatic dysfunction of lumbar region: Secondary | ICD-10-CM | POA: Diagnosis not present

## 2023-06-25 DIAGNOSIS — M62830 Muscle spasm of back: Secondary | ICD-10-CM | POA: Diagnosis not present

## 2023-06-25 DIAGNOSIS — M9903 Segmental and somatic dysfunction of lumbar region: Secondary | ICD-10-CM | POA: Diagnosis not present

## 2023-06-25 DIAGNOSIS — M9902 Segmental and somatic dysfunction of thoracic region: Secondary | ICD-10-CM | POA: Diagnosis not present

## 2023-06-25 DIAGNOSIS — M9901 Segmental and somatic dysfunction of cervical region: Secondary | ICD-10-CM | POA: Diagnosis not present

## 2023-06-30 DIAGNOSIS — M9903 Segmental and somatic dysfunction of lumbar region: Secondary | ICD-10-CM | POA: Diagnosis not present

## 2023-06-30 DIAGNOSIS — M9901 Segmental and somatic dysfunction of cervical region: Secondary | ICD-10-CM | POA: Diagnosis not present

## 2023-06-30 DIAGNOSIS — M9902 Segmental and somatic dysfunction of thoracic region: Secondary | ICD-10-CM | POA: Diagnosis not present

## 2023-06-30 DIAGNOSIS — M62830 Muscle spasm of back: Secondary | ICD-10-CM | POA: Diagnosis not present

## 2023-07-01 DIAGNOSIS — M62830 Muscle spasm of back: Secondary | ICD-10-CM | POA: Diagnosis not present

## 2023-07-01 DIAGNOSIS — M9902 Segmental and somatic dysfunction of thoracic region: Secondary | ICD-10-CM | POA: Diagnosis not present

## 2023-07-01 DIAGNOSIS — M9903 Segmental and somatic dysfunction of lumbar region: Secondary | ICD-10-CM | POA: Diagnosis not present

## 2023-07-01 DIAGNOSIS — M9901 Segmental and somatic dysfunction of cervical region: Secondary | ICD-10-CM | POA: Diagnosis not present

## 2023-07-02 DIAGNOSIS — M9902 Segmental and somatic dysfunction of thoracic region: Secondary | ICD-10-CM | POA: Diagnosis not present

## 2023-07-02 DIAGNOSIS — M62830 Muscle spasm of back: Secondary | ICD-10-CM | POA: Diagnosis not present

## 2023-07-02 DIAGNOSIS — M9901 Segmental and somatic dysfunction of cervical region: Secondary | ICD-10-CM | POA: Diagnosis not present

## 2023-07-02 DIAGNOSIS — M9903 Segmental and somatic dysfunction of lumbar region: Secondary | ICD-10-CM | POA: Diagnosis not present

## 2023-07-03 DIAGNOSIS — M9902 Segmental and somatic dysfunction of thoracic region: Secondary | ICD-10-CM | POA: Diagnosis not present

## 2023-07-03 DIAGNOSIS — M9901 Segmental and somatic dysfunction of cervical region: Secondary | ICD-10-CM | POA: Diagnosis not present

## 2023-07-03 DIAGNOSIS — M9903 Segmental and somatic dysfunction of lumbar region: Secondary | ICD-10-CM | POA: Diagnosis not present

## 2023-07-03 DIAGNOSIS — M62830 Muscle spasm of back: Secondary | ICD-10-CM | POA: Diagnosis not present

## 2023-07-04 DIAGNOSIS — H5213 Myopia, bilateral: Secondary | ICD-10-CM | POA: Diagnosis not present

## 2023-07-04 DIAGNOSIS — H31002 Unspecified chorioretinal scars, left eye: Secondary | ICD-10-CM | POA: Diagnosis not present

## 2023-07-07 DIAGNOSIS — M62830 Muscle spasm of back: Secondary | ICD-10-CM | POA: Diagnosis not present

## 2023-07-07 DIAGNOSIS — M9903 Segmental and somatic dysfunction of lumbar region: Secondary | ICD-10-CM | POA: Diagnosis not present

## 2023-07-07 DIAGNOSIS — M9902 Segmental and somatic dysfunction of thoracic region: Secondary | ICD-10-CM | POA: Diagnosis not present

## 2023-07-07 DIAGNOSIS — M9901 Segmental and somatic dysfunction of cervical region: Secondary | ICD-10-CM | POA: Diagnosis not present

## 2023-07-10 DIAGNOSIS — M62830 Muscle spasm of back: Secondary | ICD-10-CM | POA: Diagnosis not present

## 2023-07-10 DIAGNOSIS — M9901 Segmental and somatic dysfunction of cervical region: Secondary | ICD-10-CM | POA: Diagnosis not present

## 2023-07-10 DIAGNOSIS — M9903 Segmental and somatic dysfunction of lumbar region: Secondary | ICD-10-CM | POA: Diagnosis not present

## 2023-07-10 DIAGNOSIS — M9902 Segmental and somatic dysfunction of thoracic region: Secondary | ICD-10-CM | POA: Diagnosis not present

## 2023-07-11 DIAGNOSIS — M9903 Segmental and somatic dysfunction of lumbar region: Secondary | ICD-10-CM | POA: Diagnosis not present

## 2023-07-11 DIAGNOSIS — M9901 Segmental and somatic dysfunction of cervical region: Secondary | ICD-10-CM | POA: Diagnosis not present

## 2023-07-11 DIAGNOSIS — M62830 Muscle spasm of back: Secondary | ICD-10-CM | POA: Diagnosis not present

## 2023-07-11 DIAGNOSIS — M9902 Segmental and somatic dysfunction of thoracic region: Secondary | ICD-10-CM | POA: Diagnosis not present

## 2023-07-14 DIAGNOSIS — M9901 Segmental and somatic dysfunction of cervical region: Secondary | ICD-10-CM | POA: Diagnosis not present

## 2023-07-14 DIAGNOSIS — M9902 Segmental and somatic dysfunction of thoracic region: Secondary | ICD-10-CM | POA: Diagnosis not present

## 2023-07-14 DIAGNOSIS — M62830 Muscle spasm of back: Secondary | ICD-10-CM | POA: Diagnosis not present

## 2023-07-14 DIAGNOSIS — M9903 Segmental and somatic dysfunction of lumbar region: Secondary | ICD-10-CM | POA: Diagnosis not present

## 2023-07-15 DIAGNOSIS — M9901 Segmental and somatic dysfunction of cervical region: Secondary | ICD-10-CM | POA: Diagnosis not present

## 2023-07-15 DIAGNOSIS — M9902 Segmental and somatic dysfunction of thoracic region: Secondary | ICD-10-CM | POA: Diagnosis not present

## 2023-07-15 DIAGNOSIS — M9903 Segmental and somatic dysfunction of lumbar region: Secondary | ICD-10-CM | POA: Diagnosis not present

## 2023-07-15 DIAGNOSIS — M62830 Muscle spasm of back: Secondary | ICD-10-CM | POA: Diagnosis not present

## 2023-07-16 DIAGNOSIS — M62830 Muscle spasm of back: Secondary | ICD-10-CM | POA: Diagnosis not present

## 2023-07-16 DIAGNOSIS — M9901 Segmental and somatic dysfunction of cervical region: Secondary | ICD-10-CM | POA: Diagnosis not present

## 2023-07-16 DIAGNOSIS — M9902 Segmental and somatic dysfunction of thoracic region: Secondary | ICD-10-CM | POA: Diagnosis not present

## 2023-07-16 DIAGNOSIS — M9903 Segmental and somatic dysfunction of lumbar region: Secondary | ICD-10-CM | POA: Diagnosis not present

## 2023-07-17 DIAGNOSIS — M9902 Segmental and somatic dysfunction of thoracic region: Secondary | ICD-10-CM | POA: Diagnosis not present

## 2023-07-17 DIAGNOSIS — M9903 Segmental and somatic dysfunction of lumbar region: Secondary | ICD-10-CM | POA: Diagnosis not present

## 2023-07-17 DIAGNOSIS — M62830 Muscle spasm of back: Secondary | ICD-10-CM | POA: Diagnosis not present

## 2023-07-17 DIAGNOSIS — M9901 Segmental and somatic dysfunction of cervical region: Secondary | ICD-10-CM | POA: Diagnosis not present

## 2023-07-18 DIAGNOSIS — M9903 Segmental and somatic dysfunction of lumbar region: Secondary | ICD-10-CM | POA: Diagnosis not present

## 2023-07-18 DIAGNOSIS — M9901 Segmental and somatic dysfunction of cervical region: Secondary | ICD-10-CM | POA: Diagnosis not present

## 2023-07-18 DIAGNOSIS — M62830 Muscle spasm of back: Secondary | ICD-10-CM | POA: Diagnosis not present

## 2023-07-18 DIAGNOSIS — M9902 Segmental and somatic dysfunction of thoracic region: Secondary | ICD-10-CM | POA: Diagnosis not present

## 2023-07-21 DIAGNOSIS — M9902 Segmental and somatic dysfunction of thoracic region: Secondary | ICD-10-CM | POA: Diagnosis not present

## 2023-07-21 DIAGNOSIS — M62830 Muscle spasm of back: Secondary | ICD-10-CM | POA: Diagnosis not present

## 2023-07-21 DIAGNOSIS — M9903 Segmental and somatic dysfunction of lumbar region: Secondary | ICD-10-CM | POA: Diagnosis not present

## 2023-07-21 DIAGNOSIS — M9901 Segmental and somatic dysfunction of cervical region: Secondary | ICD-10-CM | POA: Diagnosis not present

## 2023-07-22 DIAGNOSIS — M9901 Segmental and somatic dysfunction of cervical region: Secondary | ICD-10-CM | POA: Diagnosis not present

## 2023-07-22 DIAGNOSIS — M9902 Segmental and somatic dysfunction of thoracic region: Secondary | ICD-10-CM | POA: Diagnosis not present

## 2023-07-22 DIAGNOSIS — M62830 Muscle spasm of back: Secondary | ICD-10-CM | POA: Diagnosis not present

## 2023-07-22 DIAGNOSIS — M9903 Segmental and somatic dysfunction of lumbar region: Secondary | ICD-10-CM | POA: Diagnosis not present

## 2023-07-23 ENCOUNTER — Ambulatory Visit (INDEPENDENT_AMBULATORY_CARE_PROVIDER_SITE_OTHER): Payer: Medicare Other | Admitting: Emergency Medicine

## 2023-07-23 ENCOUNTER — Encounter: Payer: Self-pay | Admitting: Emergency Medicine

## 2023-07-23 VITALS — BP 134/78 | HR 76 | Temp 97.7°F | Ht 66.0 in | Wt 186.5 lb

## 2023-07-23 DIAGNOSIS — M79604 Pain in right leg: Secondary | ICD-10-CM | POA: Insufficient documentation

## 2023-07-23 DIAGNOSIS — I10 Essential (primary) hypertension: Secondary | ICD-10-CM | POA: Diagnosis not present

## 2023-07-23 DIAGNOSIS — M62830 Muscle spasm of back: Secondary | ICD-10-CM | POA: Diagnosis not present

## 2023-07-23 DIAGNOSIS — M79605 Pain in left leg: Secondary | ICD-10-CM

## 2023-07-23 DIAGNOSIS — M9902 Segmental and somatic dysfunction of thoracic region: Secondary | ICD-10-CM | POA: Diagnosis not present

## 2023-07-23 DIAGNOSIS — M9903 Segmental and somatic dysfunction of lumbar region: Secondary | ICD-10-CM | POA: Diagnosis not present

## 2023-07-23 DIAGNOSIS — R29818 Other symptoms and signs involving the nervous system: Secondary | ICD-10-CM | POA: Insufficient documentation

## 2023-07-23 DIAGNOSIS — M9901 Segmental and somatic dysfunction of cervical region: Secondary | ICD-10-CM | POA: Diagnosis not present

## 2023-07-23 NOTE — Progress Notes (Signed)
[name] 82 y.o.   Chief Complaint  Patient presents with   Leg Pain    Patient is having pain left leg and foot, pain is radiating around to the other leg.     HISTORY OF PRESENT ILLNESS: This is a 81 y.o. male complaining of intermittent pain to both lower legs mostly coming from lower back for the past several months Affected by walking and or certain positions Denies bowel or bladder symptoms No other complaints or medical concerns  Leg Pain     Prior to Admission medications   Medication Sig Start Date End Date Taking? Authorizing Provider  albuterol (PROVENTIL) (2.5 MG/3ML) 0.083% nebulizer solution USE 1 VIAL IN NEBULIZER EVERY 4 HOURS AS NEEDED 09/02/22  Yes Bayan Hedstrom, Eilleen Kempf, MD  amLODipine (NORVASC) 5 MG tablet Take 1 tablet (5 mg total) by mouth daily. 05/08/22  Yes Emonni Depasquale, Eilleen Kempf, MD  Ascorbic Acid (VITAMIN C) 1000 MG tablet Take 1,000 mg by mouth daily.   Yes [provider]  aspirin 81 MG EC tablet Take 81 mg by mouth every morning.   Yes [provider]  budesonide-formoterol (SYMBICORT) 160-4.5 MCG/ACT inhaler TAKE 2 PUFFS FIRST THING IN AM AND THEN ANOTHER 2 PUFFS ABOUT 12 HOURS LATER. 02/20/23  Yes Nyoka Cowden, MD  CHELATED MAGNESIUM PO Take 1 tablet by mouth daily.   Yes [provider]  cholecalciferol (VITAMIN D3) 25 MCG (1000 UT) tablet Take 1,000 Units by mouth daily.   Yes [provider]  cyanocobalamin 1000 MCG tablet Take 1,000 mcg by mouth daily.   Yes [provider]  famotidine (PEPCID) 20 MG tablet Take 20 mg by mouth daily.   Yes [provider]  Ibuprofen 200 MG CAPS Take 200 mg by mouth every 6 (six) hours as needed (headache/pain).    Yes [provider]  meclizine (ANTIVERT) 12.5 MG tablet Take 1 tablet by mouth 4 times a day as needed for dizziness 06/19/23  Yes Agustina Witzke, Eilleen Kempf, MD  oxymetazoline (AFRIN) 0.05 % nasal spray Place 1 spray into both nostrils 2 (two)  times daily as needed for congestion.   Yes [provider]  Polyvinyl Alcohol-Povidone (REFRESH OP) Apply 1 drop to eye as needed (dry eyes).    Yes [provider]  Respiratory Therapy Supplies (FLUTTER) DEVI Use every 4 hours as needed for cough and congestion   Yes [provider]  guaifenesin (HUMIBID E) 400 MG TABS tablet Take 400 mg by mouth every 4 (four) hours as needed. Patient not taking: Reported on 01/16/2023    [provider]  predniSONE (DELTASONE) 10 MG tablet 2 DAILY UNTIL BETTER, THEN 1 X 3 DAYS AND STOP Patient not taking: Reported on 01/16/2023 11/19/21   Nyoka Cowden, MD    No Known Allergies  Patient Active Problem List   Diagnosis Date Noted   Sciatica, right side 03/03/2023   Paresthesia of both feet 01/15/2023   Prediabetes 01/15/2023   Stage 3a chronic kidney disease (HCC) 06/22/2017   Hx of adenomatous colonic polyps 06/25/2015   Esophageal stricture    Essential hypertension 09/05/2011   Dyslipidemia 08/08/2011   Esophageal dysphagia 03/12/2011   GERD 11/06/2009   Seasonal and perennial allergic rhinitis 05/31/2009   Severe persistent asthma 01/04/2008    Past Medical History:  Diagnosis Date   Acid reflux    Asthma 06/18/2011   Borderline hypertension    Cataract    Colon polyps 2011   1 Hyperplastic and  1 Tubular Adenoma    Dysphagia    Esophageal stricture    GERD (gastroesophageal reflux disease)    Hx of adenomatous colonic polyps 06/25/2015   Personal history of colonic polyps 12/1989   Shingles    Shoulder pain, right    Unspecified asthma(493.90)    Urinary frequency     Past Surgical History:  Procedure Laterality Date   BALLOON DILATION N/A 02/07/2015   Procedure: BALLOON DILATION;  Surgeon: Iva Boop, MD;  Location: [facility];  Service: Endoscopy;  Laterality: N/A;   ESOPHAGOGASTRODUODENOSCOPY (EGD) WITH PROPOFOL N/A 02/07/2015   Procedure: ESOPHAGOGASTRODUODENOSCOPY (EGD) WITH PROPOFOL;   Surgeon: Iva Boop, MD;  Location: [facility];  Service: Endoscopy;  Laterality: N/A;   HEMORRHOID SURGERY     HERNIA REPAIR     MASS EXCISION N/A 08/14/2016   Procedure: EXCISION SEBACEOUS CYST BACK;  Surgeon: Luretha Murphy, MD;  Location: [facility];  Service: General;  Laterality: N/A;  EXCISION SEBACEOUS CYST BACK   TONSILLECTOMY      Social History   Socioeconomic History   Marital status: Married    Spouse name: Not on file   Number of children: 7   Years of education: Not on file   Highest education level: Not on file  Occupational History   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    Associate Professor: RETIRED  Tobacco Use   Smoking status: Never    Passive exposure: Never   Smokeless tobacco: Never  Vaping Use   Vaping status: Never Used  Substance and Sexual Activity   Alcohol use: No   Drug use: No   Sexual activity: Not on file  Other Topics Concern   Not on file  Social History Narrative   Patient is separated from spouse   Social Determinants of Health   Financial Resource Strain: Not on file  Food Insecurity: Not on file  Transportation Needs: Not on file  Physical Activity: Not on file  Stress: Not on file  Social Connections: Unknown (05/07/2022)   Received from [facility]   Social Network    Social Network: Not on file  Intimate Partner Violence: Unknown (03/28/2022)   Received from [facility]   HITS    Physically Hurt: Not on file    Insult or Talk Down To: Not on file    Threaten Physical Harm: Not on file    Scream or Curse: Not on file    Family History  Problem Relation Age of Onset   Heart disease Mother    Kidney failure Sister    Breast cancer Sister    Lung cancer Brother    Brain cancer Sister    Asthma Sister    Colon cancer Neg Hx      Review of Systems  Constitutional: Negative.  Negative for chills and fever.  HENT: Negative.  Negative for congestion and sore throat.   Respiratory: Negative.   Negative for cough and shortness of breath.   Cardiovascular: Negative.  Negative for chest pain and palpitations.  Gastrointestinal:  Negative for abdominal pain, diarrhea, nausea and vomiting.  Genitourinary: Negative.  Negative for dysuria and hematuria.  Musculoskeletal:  Positive for back pain.  Skin: Negative.  Negative for rash.  Neurological: Negative.  Negative for dizziness and headaches.  All other systems reviewed and are negative.   Vitals:   07/23/23 1351  BP: 134/78  Pulse: 76  Temp: 97.7 F (36.5 C)  SpO2: 98%  Physical Exam Vitals reviewed.  Constitutional:      Appearance: Normal appearance.  HENT:     Head: Normocephalic.     Mouth/Throat:     Mouth: Mucous membranes are moist.     Pharynx: Oropharynx is clear.  Eyes:     Extraocular Movements: Extraocular movements intact.     Pupils: Pupils are equal, round, and reactive to light.  Cardiovascular:     Rate and Rhythm: Normal rate and regular rhythm.     Pulses: Normal pulses.     Heart sounds: Normal heart sounds.  Pulmonary:     Effort: Pulmonary effort is normal.     Breath sounds: Normal breath sounds.  Abdominal:     Palpations: Abdomen is soft.     Tenderness: There is no abdominal tenderness.  Musculoskeletal:     Cervical back: No tenderness.     Comments: Lower extremities: Warm to touch.  No swelling or erythema.  Good distal sensation.  Good distal peripheral pulses.  Good capillary refills.  Lymphadenopathy:     Cervical: No cervical adenopathy.  Skin:    Capillary Refill: Capillary refill takes less than 2 seconds.  Neurological:     General: No focal deficit present.     Mental Status: He is alert and oriented to person, place, and time.     Sensory: No sensory deficit.     Motor: No weakness.     Gait: Gait normal.  Psychiatric:        Mood and Affect: Mood normal.        Behavior: Behavior normal.     ASSESSMENT & PLAN: A total of 44 minutes was spent with the patient  and counseling/coordination of care regarding preparing for this visit, review of most recent office visit notes, review of chronic medical conditions under management, review of all medications, pain management, differential diagnosis of bilateral leg pain and possible spinal stenosis with neurogenic claudication, need for orthopedic evaluation, need for lumbar spine MRI, prognosis, ED precautions, documentation and need for follow-up.  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  Requested today.      Relevant Orders   MR Lumbar Spine Wo Contrast   Ambulatory referral to Orthopedic Surgery   Patient Instructions  Spinal Stenosis  Spinal stenosis happens when the spinal canal gets smaller. The spinal canal is the space between the bones of your spine (vertebrae). As the canal gets smaller, the nerves that pass that part of the spine are pressed. This causes pain, weakness, or loss of feeling (numbness) in your arms or legs. Spinal stenosis can affect your neck, upper back, or lower back. What are the causes? This condition is caused by parts of bone  that push into your spinal canal. This problem may be present at birth. If it occurs after  birth, the cause may be: Breakdown of bones of your spine. This normally starts between 34 and 35 years of age. Injury to your spine. Any surgeries you have had to your spine. Tumors in your spine. A buildup of calcium in your spine. What increases the risk? You are more likely to develop this condition if: You are older than age 55. You were born with a problem in your spine, such as a curved spine (scoliosis). You have arthritis. This is disease of your joints. What are the signs or symptoms? Common symptoms of this condition include: Pain in your neck or back. The pain may be worse when you stand or walk. Problems with your legs or arms. A leg or arm may lose feeling, tingle, or turn hot or cold. This can happen in one arm or leg, or both. Pain that goes from your butt down to your lower leg (sciatica). This can happen in one or both legs. Falling a lot. Foot drop. This is when you have trouble lifting the front part of your foot and it drags on the ground when you walk. Severe symptoms of this condition include: Problems pooping (having a bowel movement) or peeing (urinating). Trouble having sex. Loss of feeling in your leg. Being unable to walk. Symptoms may come on slowly and get worse over time. Sometimes there are no symptoms. How is this treated? To treat pain and manage symptoms, you may be asked to: Practice sitting and standing up straight. This is good posture. Do exercises. Lose weight, if needed. Take medicines or get shots. Wear a corset or a brace. This supports your back. In some cases, you may need to have surgery. Follow these instructions at home: Managing pain, stiffness, and swelling  Stand and sit up straight. If you have a brace or a corset, wear it as told by your doctor. Keep a healthy weight. Talk with your doctor if you need help losing weight. If told, put heat on the affected area. Do this as often as told by your doctor. Use the heat source that  your doctor recommends, such as a moist heat pack or a heating pad. Place a towel between your skin and the heat source. Leave the heat on for 20-30 minutes. If your skin turns bright red, take off the heat right away to prevent burns. The risk of burns is higher if you cannot feel pain, heat, or cold. Activity Do exercises as told by your doctor. Do not do anything that causes pain. Ask your doctor what activities are safe for you. You may have to avoid lifting. Ask your doctor how much you can lift. Return to your normal activities when your doctor says that it is safe. General instructions Take over-the-counter and prescription medicines only as told by your doctor. Do not smoke or use any products that contain nicotine or tobacco. If you need help quitting, ask your doctor. Eat a healthy diet. Eat a lot of: Fruits. Vegetables. Whole grains. Low-fat (lean) protein. Where to find more information [employer] of Arthritis and Musculoskeletal and Skin Diseases: www.niams.http://www.myers.net/ Contact a doctor if: Your symptoms do not get better. Your symptoms get worse. You have a fever. Get help right away if: You have new pain or very bad pain in your neck or upper back. You have very bad pain, and medicine does not help. You have a very bad  headache. You are dizzy. You do not see well. You vomit or feel like you may vomit. You have these things in your back or legs: New or worse loss of feeling. New or worse tingling. You cannot control when you poop or pee. Your arm or leg: Hurts or swells. Turns red. Feels warm. These symptoms may be an emergency. Get help right away. Call 911. Do not wait to see if the symptoms will go away. Do not drive yourself to the hospital. Summary Spinal stenosis happens when the space between the bones of the spine gets smaller. This condition may be present at birth. Spinal stenosis can cause pain in the neck, back, legs, or butt. Treatment for  this condition focuses on lessening your pain and other symptoms. This information is not intended to replace advice given to you by your health care provider. Make sure you discuss any questions you have with your health care provider. Document Revised: 03/18/2022 Document Reviewed: 03/05/2022 Elsevier Patient Education  2024 Elsevier Inc.    Edwina Barth, MD [facility]

## 2023-07-23 NOTE — Assessment & Plan Note (Signed)
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  Requested today.

## 2023-07-23 NOTE — Assessment & Plan Note (Signed)
Symptoms very suggestive of neurogenic claudication Most likely has spinal stenosis Will need lumbar spine MRI Needs orthopedic evaluation Referral placed today

## 2023-07-23 NOTE — Patient Instructions (Signed)
Spinal Stenosis  Spinal stenosis happens when the spinal canal gets smaller. The spinal canal is the space between the bones of your spine (vertebrae). As the canal gets smaller, the nerves that pass that part of the spine are pressed. This causes pain, weakness, or loss of feeling (numbness) in your arms or legs. Spinal stenosis can affect your neck, upper back, or lower back. What are the causes? This condition is caused by parts of bone that push into your spinal canal. This problem may be present at birth. If it occurs after birth, the cause may be: Breakdown of bones of your spine. This normally starts between 16 and 80 years of age. Injury to your spine. Any surgeries you have had to your spine. Tumors in your spine. A buildup of calcium in your spine. What increases the risk? You are more likely to develop this condition if: You are older than age 88. You were born with a problem in your spine, such as a curved spine (scoliosis). You have arthritis. This is disease of your joints. What are the signs or symptoms? Common symptoms of this condition include: Pain in your neck or back. The pain may be worse when you stand or walk. Problems with your legs or arms. A leg or arm may lose feeling, tingle, or turn hot or cold. This can happen in one arm or leg, or both. Pain that goes from your butt down to your lower leg (sciatica). This can happen in one or both legs. Falling a lot. Foot drop. This is when you have trouble lifting the front part of your foot and it drags on the ground when you walk. Severe symptoms of this condition include: Problems pooping (having a bowel movement) or peeing (urinating). Trouble having sex. Loss of feeling in your leg. Being unable to walk. Symptoms may come on slowly and get worse over time. Sometimes there are no symptoms. How is this treated? To treat pain and manage symptoms, you may be asked to: Practice sitting and standing up straight. This is  good posture. Do exercises. Lose weight, if needed. Take medicines or get shots. Wear a corset or a brace. This supports your back. In some cases, you may need to have surgery. Follow these instructions at home: Managing pain, stiffness, and swelling  Stand and sit up straight. If you have a brace or a corset, wear it as told by your doctor. Keep a healthy weight. Talk with your doctor if you need help losing weight. If told, put heat on the affected area. Do this as often as told by your doctor. Use the heat source that your doctor recommends, such as a moist heat pack or a heating pad. Place a towel between your skin and the heat source. Leave the heat on for 20-30 minutes. If your skin turns bright red, take off the heat right away to prevent burns. The risk of burns is higher if you cannot feel pain, heat, or cold. Activity Do exercises as told by your doctor. Do not do anything that causes pain. Ask your doctor what activities are safe for you. You may have to avoid lifting. Ask your doctor how much you can lift. Return to your normal activities when your doctor says that it is safe. General instructions Take over-the-counter and prescription medicines only as told by your doctor. Do not smoke or use any products that contain nicotine or tobacco. If you need help quitting, ask your doctor. Eat a healthy diet. Eat  a lot of: Fruits. Vegetables. Whole grains. Low-fat (lean) protein. Where to find more information [employer] of Arthritis and Musculoskeletal and Skin Diseases: www.niams.http://www.myers.net/ Contact a doctor if: Your symptoms do not get better. Your symptoms get worse. You have a fever. Get help right away if: You have new pain or very bad pain in your neck or upper back. You have very bad pain, and medicine does not help. You have a very bad headache. You are dizzy. You do not see well. You vomit or feel like you may vomit. You have these things in your back or  legs: New or worse loss of feeling. New or worse tingling. You cannot control when you poop or pee. Your arm or leg: Hurts or swells. Turns red. Feels warm. These symptoms may be an emergency. Get help right away. Call 911. Do not wait to see if the symptoms will go away. Do not drive yourself to the hospital. Summary Spinal stenosis happens when the space between the bones of the spine gets smaller. This condition may be present at birth. Spinal stenosis can cause pain in the neck, back, legs, or butt. Treatment for this condition focuses on lessening your pain and other symptoms. This information is not intended to replace advice given to you by your health care provider. Make sure you discuss any questions you have with your health care provider. Document Revised: 03/18/2022 Document Reviewed: 03/05/2022 Elsevier Patient Education  2024 [employer].

## 2023-07-23 NOTE — Assessment & Plan Note (Signed)
BP Readings from Last 3 Encounters:  07/23/23 134/78  03/03/23 130/74  01/16/23 122/76  Well-controlled hypertension Continue amlodipine 5 mg daily

## 2023-07-28 DIAGNOSIS — M9902 Segmental and somatic dysfunction of thoracic region: Secondary | ICD-10-CM | POA: Diagnosis not present

## 2023-07-28 DIAGNOSIS — M9903 Segmental and somatic dysfunction of lumbar region: Secondary | ICD-10-CM | POA: Diagnosis not present

## 2023-07-28 DIAGNOSIS — M9901 Segmental and somatic dysfunction of cervical region: Secondary | ICD-10-CM | POA: Diagnosis not present

## 2023-07-28 DIAGNOSIS — M62830 Muscle spasm of back: Secondary | ICD-10-CM | POA: Diagnosis not present

## 2023-07-30 DIAGNOSIS — M9901 Segmental and somatic dysfunction of cervical region: Secondary | ICD-10-CM | POA: Diagnosis not present

## 2023-07-30 DIAGNOSIS — M62830 Muscle spasm of back: Secondary | ICD-10-CM | POA: Diagnosis not present

## 2023-07-30 DIAGNOSIS — M9903 Segmental and somatic dysfunction of lumbar region: Secondary | ICD-10-CM | POA: Diagnosis not present

## 2023-07-30 DIAGNOSIS — M9902 Segmental and somatic dysfunction of thoracic region: Secondary | ICD-10-CM | POA: Diagnosis not present

## 2023-07-30 DIAGNOSIS — M9904 Segmental and somatic dysfunction of sacral region: Secondary | ICD-10-CM | POA: Diagnosis not present

## 2023-07-31 DIAGNOSIS — M9903 Segmental and somatic dysfunction of lumbar region: Secondary | ICD-10-CM | POA: Diagnosis not present

## 2023-07-31 DIAGNOSIS — M62830 Muscle spasm of back: Secondary | ICD-10-CM | POA: Diagnosis not present

## 2023-07-31 DIAGNOSIS — M9904 Segmental and somatic dysfunction of sacral region: Secondary | ICD-10-CM | POA: Diagnosis not present

## 2023-07-31 DIAGNOSIS — M9902 Segmental and somatic dysfunction of thoracic region: Secondary | ICD-10-CM | POA: Diagnosis not present

## 2023-07-31 DIAGNOSIS — M9901 Segmental and somatic dysfunction of cervical region: Secondary | ICD-10-CM | POA: Diagnosis not present

## 2023-08-01 DIAGNOSIS — M9902 Segmental and somatic dysfunction of thoracic region: Secondary | ICD-10-CM | POA: Diagnosis not present

## 2023-08-01 DIAGNOSIS — M9903 Segmental and somatic dysfunction of lumbar region: Secondary | ICD-10-CM | POA: Diagnosis not present

## 2023-08-01 DIAGNOSIS — M9904 Segmental and somatic dysfunction of sacral region: Secondary | ICD-10-CM | POA: Diagnosis not present

## 2023-08-01 DIAGNOSIS — M62830 Muscle spasm of back: Secondary | ICD-10-CM | POA: Diagnosis not present

## 2023-08-01 DIAGNOSIS — M9901 Segmental and somatic dysfunction of cervical region: Secondary | ICD-10-CM | POA: Diagnosis not present

## 2023-08-04 DIAGNOSIS — M62830 Muscle spasm of back: Secondary | ICD-10-CM | POA: Diagnosis not present

## 2023-08-04 DIAGNOSIS — M9901 Segmental and somatic dysfunction of cervical region: Secondary | ICD-10-CM | POA: Diagnosis not present

## 2023-08-04 DIAGNOSIS — M9903 Segmental and somatic dysfunction of lumbar region: Secondary | ICD-10-CM | POA: Diagnosis not present

## 2023-08-04 DIAGNOSIS — M9902 Segmental and somatic dysfunction of thoracic region: Secondary | ICD-10-CM | POA: Diagnosis not present

## 2023-08-06 DIAGNOSIS — M9902 Segmental and somatic dysfunction of thoracic region: Secondary | ICD-10-CM | POA: Diagnosis not present

## 2023-08-06 DIAGNOSIS — M9901 Segmental and somatic dysfunction of cervical region: Secondary | ICD-10-CM | POA: Diagnosis not present

## 2023-08-06 DIAGNOSIS — M62830 Muscle spasm of back: Secondary | ICD-10-CM | POA: Diagnosis not present

## 2023-08-06 DIAGNOSIS — M9903 Segmental and somatic dysfunction of lumbar region: Secondary | ICD-10-CM | POA: Diagnosis not present

## 2023-08-07 DIAGNOSIS — M9903 Segmental and somatic dysfunction of lumbar region: Secondary | ICD-10-CM | POA: Diagnosis not present

## 2023-08-07 DIAGNOSIS — M62830 Muscle spasm of back: Secondary | ICD-10-CM | POA: Diagnosis not present

## 2023-08-07 DIAGNOSIS — M9902 Segmental and somatic dysfunction of thoracic region: Secondary | ICD-10-CM | POA: Diagnosis not present

## 2023-08-07 DIAGNOSIS — M9901 Segmental and somatic dysfunction of cervical region: Secondary | ICD-10-CM | POA: Diagnosis not present

## 2023-08-08 DIAGNOSIS — M9903 Segmental and somatic dysfunction of lumbar region: Secondary | ICD-10-CM | POA: Diagnosis not present

## 2023-08-08 DIAGNOSIS — M9902 Segmental and somatic dysfunction of thoracic region: Secondary | ICD-10-CM | POA: Diagnosis not present

## 2023-08-08 DIAGNOSIS — M9901 Segmental and somatic dysfunction of cervical region: Secondary | ICD-10-CM | POA: Diagnosis not present

## 2023-08-08 DIAGNOSIS — M62830 Muscle spasm of back: Secondary | ICD-10-CM | POA: Diagnosis not present

## 2023-08-11 DIAGNOSIS — M9903 Segmental and somatic dysfunction of lumbar region: Secondary | ICD-10-CM | POA: Diagnosis not present

## 2023-08-11 DIAGNOSIS — M9902 Segmental and somatic dysfunction of thoracic region: Secondary | ICD-10-CM | POA: Diagnosis not present

## 2023-08-11 DIAGNOSIS — M9901 Segmental and somatic dysfunction of cervical region: Secondary | ICD-10-CM | POA: Diagnosis not present

## 2023-08-11 DIAGNOSIS — M62830 Muscle spasm of back: Secondary | ICD-10-CM | POA: Diagnosis not present

## 2023-08-12 DIAGNOSIS — M9901 Segmental and somatic dysfunction of cervical region: Secondary | ICD-10-CM | POA: Diagnosis not present

## 2023-08-12 DIAGNOSIS — M9903 Segmental and somatic dysfunction of lumbar region: Secondary | ICD-10-CM | POA: Diagnosis not present

## 2023-08-12 DIAGNOSIS — M62830 Muscle spasm of back: Secondary | ICD-10-CM | POA: Diagnosis not present

## 2023-08-12 DIAGNOSIS — M9902 Segmental and somatic dysfunction of thoracic region: Secondary | ICD-10-CM | POA: Diagnosis not present

## 2023-08-14 ENCOUNTER — Ambulatory Visit: Payer: Medicare Other | Admitting: Orthopedic Surgery

## 2023-08-19 DIAGNOSIS — M9903 Segmental and somatic dysfunction of lumbar region: Secondary | ICD-10-CM | POA: Diagnosis not present

## 2023-08-19 DIAGNOSIS — M62830 Muscle spasm of back: Secondary | ICD-10-CM | POA: Diagnosis not present

## 2023-08-19 DIAGNOSIS — M9901 Segmental and somatic dysfunction of cervical region: Secondary | ICD-10-CM | POA: Diagnosis not present

## 2023-08-19 DIAGNOSIS — M9902 Segmental and somatic dysfunction of thoracic region: Secondary | ICD-10-CM | POA: Diagnosis not present

## 2023-08-22 DIAGNOSIS — M62830 Muscle spasm of back: Secondary | ICD-10-CM | POA: Diagnosis not present

## 2023-08-22 DIAGNOSIS — M9902 Segmental and somatic dysfunction of thoracic region: Secondary | ICD-10-CM | POA: Diagnosis not present

## 2023-08-22 DIAGNOSIS — M9901 Segmental and somatic dysfunction of cervical region: Secondary | ICD-10-CM | POA: Diagnosis not present

## 2023-08-22 DIAGNOSIS — M9903 Segmental and somatic dysfunction of lumbar region: Secondary | ICD-10-CM | POA: Diagnosis not present

## 2023-08-25 DIAGNOSIS — M9902 Segmental and somatic dysfunction of thoracic region: Secondary | ICD-10-CM | POA: Diagnosis not present

## 2023-08-25 DIAGNOSIS — M9903 Segmental and somatic dysfunction of lumbar region: Secondary | ICD-10-CM | POA: Diagnosis not present

## 2023-08-25 DIAGNOSIS — M62830 Muscle spasm of back: Secondary | ICD-10-CM | POA: Diagnosis not present

## 2023-08-25 DIAGNOSIS — M9901 Segmental and somatic dysfunction of cervical region: Secondary | ICD-10-CM | POA: Diagnosis not present

## 2023-08-28 DIAGNOSIS — M9902 Segmental and somatic dysfunction of thoracic region: Secondary | ICD-10-CM | POA: Diagnosis not present

## 2023-08-28 DIAGNOSIS — M9903 Segmental and somatic dysfunction of lumbar region: Secondary | ICD-10-CM | POA: Diagnosis not present

## 2023-08-28 DIAGNOSIS — M9901 Segmental and somatic dysfunction of cervical region: Secondary | ICD-10-CM | POA: Diagnosis not present

## 2023-08-28 DIAGNOSIS — M62830 Muscle spasm of back: Secondary | ICD-10-CM | POA: Diagnosis not present

## 2023-08-28 NOTE — Progress Notes (Unsigned)
Subjective:   Patient ID: [name]    DOB: 11-16-1941     Brief patient profile: 82  yobm denies ever smoking but has lifelong asthma with severe chronic pattern and best FEV1 of around 2.2 liters documented 2002 and also documented non-adherence  followed in pulmonary for primary care also for hbp and hyperlipidemia.   History of Present Illness    07/30/13 Allergy eval rx completed x 4 years > d/c'd and f/u allergy prn    04/03/2015 Follow up : chronic asthma/ HTN/non adherence / relies on samples/ no formulary / has med calendar  Pt returns for 1 month follow up .  Doing well with no asthma flare , on Dulera . [name]  Need to establish with dentist > multiple teeth extracted  06/04/16 Dr Maple Hudson rec Nucala > could not afford deductible   07/16/2022  f/u ov/Delayne Jacobson re: severe chronic asthma maint on symbicort  160 / no recent pred  needed  Chief Complaint  Patient presents with   Follow-up    Pt states his asthma is still the same without any asthma attacks.  Dyspnea:  slow up hills no change ex tol  Cough: none  Sleeping: flat bed / 2 pillows  SABA use: none  02: none  Covid status:  never had any vax  Rec For nasty mucus > zpak  No changer in medications Please schedule a follow up visit in 6 months but call sooner if needed     01/16/2023  f/u ov/[name] re: severe chronic asthma   maint on symbicort 160  / no recent pred  Chief Complaint  Patient presents with   Follow-up    Breathing doing well.  Slight sinus congestion with weather changes   Dyspnea:  MMRC2 = can't walk a nl pace on a flat grade s sob but does fine slow and flat  Cough: minimal mucoid in am but not noct assoc with nasal congestion  Sleeping: flat bed 2 pillows  SABA use: neither hfa nor neb  02: none  Covid status:   never vax, never infected  Rec No change in medications  Work on inhaler technique:      08/29/2023  f/u ov/[name] re: severe chronic asthma  maint on Symbicort 160  last dose 9 pm 9/5    Chief Complaint  Patient presents with   Follow-up    Doing well.  R ear feels full x 2 months.   Dyspnea:  MMRC2 = can't walk a nl pace on a flat grade s sob but does fine slow and flat  Cough: none  Sleeping: flat bed, 2 pillows s  resp cc  SABA use: none with him and not using hfa or neb  02: none    No obvious day to day or daytime variability or assoc excess/ purulent sputum or mucus plugs or hemoptysis or cp or chest tightness, subjective wheeze or overt sinus or hb symptoms.    Also denies any obvious fluctuation of symptoms with weather or environmental changes or other aggravating or alleviating factors except as outlined above   No unusual exposure hx or h/o childhood pna/ asthma or knowledge of premature birth.  Current Allergies, Complete Past Medical History, Past Surgical History, Family History, and Social History were reviewed in [employer] record.  ROS  The following are not active complaints unless bolded Hoarseness, sore throat, dysphagia, dental problems, itching, sneezing,  nasal congestion or discharge of excess mucus or purulent secretions, ear ache,  fever, chills, sweats, unintended wt loss or wt gain, classically pleuritic or exertional cp,  orthopnea pnd or arm/hand swelling  or leg swelling, presyncope, palpitations, abdominal pain, anorexia, nausea, vomiting, diarrhea  or change in bowel habits or change in bladder habits, change in stools or change in urine, dysuria, hematuria,  rash, arthralgias, visual complaints, headache, numbness, weakness or ataxia or problems with walking or coordination,  change in mood or  memory.        Current Meds  Medication Sig   albuterol (PROVENTIL) (2.5 MG/3ML) 0.083% nebulizer solution USE 1 VIAL IN NEBULIZER EVERY 4 HOURS AS NEEDED   amLODipine (NORVASC) 5 MG tablet Take 1 tablet (5 mg total) by mouth daily.   Ascorbic Acid (VITAMIN C) 1000 MG tablet Take 1,000 mg by mouth daily.   aspirin 81 MG  EC tablet Take 81 mg by mouth every morning.   budesonide-formoterol (SYMBICORT) 160-4.5 MCG/ACT inhaler TAKE 2 PUFFS FIRST THING IN AM AND THEN ANOTHER 2 PUFFS ABOUT 12 HOURS LATER.   CHELATED MAGNESIUM PO Take 1 tablet by mouth daily.   cholecalciferol (VITAMIN D3) 25 MCG (1000 UT) tablet Take 1,000 Units by mouth daily.   cyanocobalamin 1000 MCG tablet Take 1,000 mcg by mouth daily.   famotidine (PEPCID) 20 MG tablet Take 20 mg by mouth daily.   Ibuprofen 200 MG CAPS Take 200 mg by mouth every 6 (six) hours as needed (headache/pain).    meclizine (ANTIVERT) 12.5 MG tablet Take 1 tablet by mouth 4 times a day as needed for dizziness   oxymetazoline (AFRIN) 0.05 % nasal spray Place 1 spray into both nostrils 2 (two) times daily as needed for congestion.   Polyvinyl Alcohol-Povidone (REFRESH OP) Apply 1 drop to eye as needed (dry eyes).    Respiratory Therapy Supplies (FLUTTER) DEVI Use every 4 hours as needed for cough and congestion                    Past Medical Hx ASTHMA (ICD-493.90)  --chronic severe asthma with best FEV1 2.2 L documented 01/2001 and  documented non-adherence Mainly lives off samples of inhalers and nose sprays  - HFA 75% June 15, 2009 > 90% August 02, 2009 > 100% December 27, 2009  - Off allergy vaccines x7/2010 > highly allergic skin tests per Dr Maple Hudson > restarted vaccine November 06, 2009 > stopped  07/2013  SHOULDER PAIN, RIGHT (ICD-719.41)   HYPERTENSION, BORDERLINE (ICD-401.9)  Shingles T 8 Left 08/2000 HEALTH MAINTENANCE...............................................................................................Hector KitchenWert  - Colonoscopy 12/1989,  2011  - dT 07/25/2014  - Pneumovax 02/2003 and 05/2009 - Prevnar 07/25/14 -Flu declined October 17, 2008 and September 21, 2009 , declined flu shot 2015, 2017  -CPX 07/21/2019            Objective:  Physical Exam    Wts  08/29/2023       187   01/16/2023     191  07/16/2022     185   01/14/2022     183   07/20/2021     183  11/15/2020  184  08/08/2020   187 01/25/2020      193  09/29/2019    194  05/23/2014  201> 201 07/04/2014 > 07/25/14  196>198 08/22/2014 >194 09/19/2014 >198 10/11/2014 > 10/24/2014  196>   01/02/2015 193 > 03/06/2015  198 >198 04/03/2015 > 05/16/2015 196 >  06/02/2015 196 >197 06/16/2015 > 12/12/2015 199> 197 01/08/2016 >   02/16/2016     194 > 04/08/2016  193    Vital signs reviewed  08/29/2023  - Note at rest 02 sats  95% on RA   General appearance:    amb bm nad   HEENT :  Oropharynx  clear  Nasal turbinates nl/ Ears ok/ R Ear cough with otiscope, no ext tenderness    NECK :  without JVD/Nodes/TM/ nl carotid upstrokes bilaterally   LUNGS: no acc muscle use,  Mod barrel  contour chest wall with bilateral  Distant exp wheeze and  without cough on insp or exp maneuvers and mod  Hyperresonant  to  percussion bilaterally     CV:  RRR  no s3 or murmur or increase in P2, and no edema   ABD:  soft and nontender with pos mid insp Hoover's  in the supine position. No bruits or organomegaly appreciated, bowel sounds nl  MS:   Ext warm without deformities or   obvious joint restrictions , calf tenderness, cyanosis or clubbing  SKIN: warm and dry without lesions    NEURO:  alert, approp, nl sensorium with  no motor or cerebellar deficits apparent.

## 2023-08-29 ENCOUNTER — Ambulatory Visit (INDEPENDENT_AMBULATORY_CARE_PROVIDER_SITE_OTHER): Payer: Medicare Other | Admitting: Internal Medicine

## 2023-08-29 ENCOUNTER — Encounter: Payer: Self-pay | Admitting: Internal Medicine

## 2023-08-29 VITALS — BP 134/90 | HR 72 | Temp 97.7°F | Ht 66.0 in | Wt 187.6 lb

## 2023-08-29 DIAGNOSIS — H6121 Impacted cerumen, right ear: Secondary | ICD-10-CM

## 2023-08-29 DIAGNOSIS — J4550 Severe persistent asthma, uncomplicated: Secondary | ICD-10-CM | POA: Diagnosis not present

## 2023-08-29 NOTE — Patient Instructions (Signed)
Plan A = Automatic = Always=    symbicort 160 Take 2 puffs first thing in am and then another 2 puffs about 12 hours later.    Work on inhaler technique:  relax and gently blow all the way out then take a nice smooth full deep breath back in, triggering the inhaler at same time you start breathing in.  Hold breath in for at least  5 seconds if you can. Blow out symbicort  thru nose. Rinse and gargle with water when done.  If mouth or throat bother you at all,  try brushing teeth/gums/tongue with arm and hammer toothpaste/ make a slurry and gargle and spit out.   >>>  Remember how golfers warm up by taking practice swings - do this with an empty inhaler   Plan B = Backup (to supplement plan A, not to replace it) Only use your albuterol inhaler as a rescue medication to be used if you can't catch your breath by resting or doing a relaxed purse lip breathing pattern.  - The less you use it, the better it will work when you need it. - Ok to use the inhaler up to 2 puffs  every 4 hours if you must but call for appointment if use goes up over your usual need - Don't leave home without it !!  (think of it like the spare tire for your car)   Plan C = Crisis (instead of Plan B but only if Plan B stops working) - only use your albuterol nebulizer if you first try Plan B and it fails to help > ok to use the nebulizer up to every 4 hours but if start needing it regularly call for immediate appointment   Plan D = Deltasone  2 pills a day until bettter, then 1 daily x 3 days and stop   Please schedule a follow up visit in 6 months but call sooner if needed

## 2023-08-30 ENCOUNTER — Encounter: Payer: Self-pay | Admitting: Internal Medicine

## 2023-08-30 DIAGNOSIS — H6120 Impacted cerumen, unspecified ear: Secondary | ICD-10-CM | POA: Insufficient documentation

## 2023-08-30 NOTE — Assessment & Plan Note (Signed)
Onset summer 2024 with cough reflex on exam  - referred back to PCP if otc's not helpful/ ent prn         Each maintenance medication was reviewed in detail including emphasizing most importantly the difference between maintenance and prns and under what circumstances the prns are to be triggered using an action plan format where appropriate.  Total time for H and P, chart review, counseling, reviewing hfa device(s) and generating customized AVS unique to this office visit / same day charting = 20 min

## 2023-08-30 NOTE — Assessment & Plan Note (Signed)
Onset in childhood  --chronic severe asthma with best FEV1 2.2 L documented 01/2001 and documented non-adherence Mainly lives off samples of inhalers and nose sprays  - Off allergy vaccines x7/2010 > highly allergic skin tests per Dr Maple Hudson > restart vaccine November 06, 2009>stop 07/30/13 to watch - Spirometry on "good day" 01/14/12 =  FEV1 1.54  Ratio 0.58  - 02/16/2016 added prn prednisone when needing neb - FENO 04/23/16- 32  -CBC with differential 04/23/2016-eosinophils elevated 7%  - Spirometry 01/28/2017  FEV1 1.23 (49%)  Ratio 60    - Spirometry 08/11/2018  FEV1 1.43 (60%)  Ratio 60  - FENO 08/11/2018  =   30 on symb 160 2bid - 07/16/2022  After extensive coaching inhaler device,  effectiveness =   75% (delayed insp)  - 08/29/2023  After extensive coaching inhaler device,  effectiveness =    75% (very slow Ti, no breath hold prior to re-teaching)   Continues to struggle with compliance issues with a casual attitude that risks exac so rec   Continue Prednisone as Plan D -see avs for instructions unique to this ov   F/u q 3 m, sooner prn

## 2023-09-02 DIAGNOSIS — M9901 Segmental and somatic dysfunction of cervical region: Secondary | ICD-10-CM | POA: Diagnosis not present

## 2023-09-02 DIAGNOSIS — M9902 Segmental and somatic dysfunction of thoracic region: Secondary | ICD-10-CM | POA: Diagnosis not present

## 2023-09-02 DIAGNOSIS — M62830 Muscle spasm of back: Secondary | ICD-10-CM | POA: Diagnosis not present

## 2023-09-02 DIAGNOSIS — M9903 Segmental and somatic dysfunction of lumbar region: Secondary | ICD-10-CM | POA: Diagnosis not present

## 2023-09-03 ENCOUNTER — Ambulatory Visit (INDEPENDENT_AMBULATORY_CARE_PROVIDER_SITE_OTHER): Payer: Medicare Other | Admitting: Emergency Medicine

## 2023-09-03 ENCOUNTER — Encounter: Payer: Self-pay | Admitting: Emergency Medicine

## 2023-09-03 VITALS — BP 120/74 | HR 72 | Temp 97.6°F | Ht 66.0 in | Wt 186.4 lb

## 2023-09-03 DIAGNOSIS — M79605 Pain in left leg: Secondary | ICD-10-CM | POA: Diagnosis not present

## 2023-09-03 DIAGNOSIS — E785 Hyperlipidemia, unspecified: Secondary | ICD-10-CM

## 2023-09-03 DIAGNOSIS — M9901 Segmental and somatic dysfunction of cervical region: Secondary | ICD-10-CM | POA: Diagnosis not present

## 2023-09-03 DIAGNOSIS — R7303 Prediabetes: Secondary | ICD-10-CM

## 2023-09-03 DIAGNOSIS — N1831 Chronic kidney disease, stage 3a: Secondary | ICD-10-CM

## 2023-09-03 DIAGNOSIS — J4550 Severe persistent asthma, uncomplicated: Secondary | ICD-10-CM

## 2023-09-03 DIAGNOSIS — M62830 Muscle spasm of back: Secondary | ICD-10-CM | POA: Diagnosis not present

## 2023-09-03 DIAGNOSIS — M9903 Segmental and somatic dysfunction of lumbar region: Secondary | ICD-10-CM | POA: Diagnosis not present

## 2023-09-03 DIAGNOSIS — M79604 Pain in right leg: Secondary | ICD-10-CM | POA: Diagnosis not present

## 2023-09-03 DIAGNOSIS — I10 Essential (primary) hypertension: Secondary | ICD-10-CM | POA: Diagnosis not present

## 2023-09-03 DIAGNOSIS — R29818 Other symptoms and signs involving the nervous system: Secondary | ICD-10-CM

## 2023-09-03 DIAGNOSIS — M9902 Segmental and somatic dysfunction of thoracic region: Secondary | ICD-10-CM | POA: Diagnosis not present

## 2023-09-03 NOTE — Progress Notes (Signed)
[name] 82 y.o.   Chief Complaint  Patient presents with   Medical Management of Chronic Issues    f/u appt, patient states he still having some swelling in his legs.     HISTORY OF PRESENT ILLNESS: This is a 82 y.o. male here for 57-month follow-up of chronic medical problems. Has intermittent episodes of lumbar and leg pain with occasional swelling in his legs Seen by me on 07/23/2023 with suspected spinal stenosis Lumbar spine MRI requested.  Not scheduled yet. Ortho evaluation requested.  Not scheduled yet.   HPI   Prior to Admission medications   Medication Sig Start Date End Date Taking? Authorizing Provider  albuterol (PROVENTIL) (2.5 MG/3ML) 0.083% nebulizer solution USE 1 VIAL IN NEBULIZER EVERY 4 HOURS AS NEEDED 09/02/22  Yes Mycala Warshawsky, Eilleen Kempf, MD  amLODipine (NORVASC) 5 MG tablet Take 1 tablet (5 mg total) by mouth daily. 05/08/22  Yes Maira Christon, Eilleen Kempf, MD  Ascorbic Acid (VITAMIN C) 1000 MG tablet Take 1,000 mg by mouth daily.   Yes [provider]  aspirin 81 MG EC tablet Take 81 mg by mouth every morning.   Yes [provider]  budesonide-formoterol (SYMBICORT) 160-4.5 MCG/ACT inhaler TAKE 2 PUFFS FIRST THING IN AM AND THEN ANOTHER 2 PUFFS ABOUT 12 HOURS LATER. 02/20/23  Yes Nyoka Cowden, MD  CHELATED MAGNESIUM PO Take 1 tablet by mouth daily.   Yes [provider]  cholecalciferol (VITAMIN D3) 25 MCG (1000 UT) tablet Take 1,000 Units by mouth daily.   Yes [provider]  cyanocobalamin 1000 MCG tablet Take 1,000 mcg by mouth daily.   Yes [provider]  famotidine (PEPCID) 20 MG tablet Take 20 mg by mouth daily.   Yes [provider]  Ibuprofen 200 MG CAPS Take 200 mg by mouth every 6 (six) hours as needed (headache/pain).    Yes [provider]  meclizine (ANTIVERT) 12.5 MG tablet Take 1 tablet by mouth 4 times a day as needed for dizziness 06/19/23  Yes Adraine Biffle, Eilleen Kempf, MD   oxymetazoline (AFRIN) 0.05 % nasal spray Place 1 spray into both nostrils 2 (two) times daily as needed for congestion.   Yes [provider]  Polyvinyl Alcohol-Povidone (REFRESH OP) Apply 1 drop to eye as needed (dry eyes).    Yes [provider]  Respiratory Therapy Supplies (FLUTTER) DEVI Use every 4 hours as needed for cough and congestion    [provider]    No Known Allergies  Patient Active Problem List   Diagnosis Date Noted   Impacted ear wax OD with cough reflex 08/30/2023   Neurogenic claudication 07/23/2023   Bilateral leg pain 07/23/2023   Sciatica, right side 03/03/2023   Paresthesia of both feet 01/15/2023   Prediabetes 01/15/2023   Stage 3a chronic kidney disease (HCC) 06/22/2017   Hx of adenomatous colonic polyps 06/25/2015   Esophageal stricture    Essential hypertension 09/05/2011   Dyslipidemia 08/08/2011   Esophageal dysphagia 03/12/2011   GERD 11/06/2009   Seasonal and perennial allergic rhinitis 05/31/2009   Severe persistent asthma 01/04/2008    Past Medical History:  Diagnosis Date   Acid reflux    Asthma 06/18/2011   Borderline hypertension    Cataract    Colon polyps 2011   1 Hyperplastic and 1 Tubular Adenoma    Dysphagia    Esophageal stricture    GERD (gastroesophageal reflux disease)    Hx of adenomatous colonic polyps 06/25/2015   Personal history  of colonic polyps 12/1989   Shingles    Shoulder pain, right    Unspecified asthma(493.90)    Urinary frequency     Past Surgical History:  Procedure Laterality Date   BALLOON DILATION N/A 02/07/2015   Procedure: BALLOON DILATION;  Surgeon: Iva Boop, MD;  Location: [facility];  Service: Endoscopy;  Laterality: N/A;   ESOPHAGOGASTRODUODENOSCOPY (EGD) WITH PROPOFOL N/A 02/07/2015   Procedure: ESOPHAGOGASTRODUODENOSCOPY (EGD) WITH PROPOFOL;  Surgeon: Iva Boop, MD;  Location: [facility];  Service: Endoscopy;  Laterality: N/A;   HEMORRHOID SURGERY      HERNIA REPAIR     MASS EXCISION N/A 08/14/2016   Procedure: EXCISION SEBACEOUS CYST BACK;  Surgeon: Luretha Murphy, MD;  Location: [facility];  Service: General;  Laterality: N/A;  EXCISION SEBACEOUS CYST BACK   TONSILLECTOMY      Social History   Socioeconomic History   Marital status: Married    Spouse name: Not on file   Number of children: 7   Years of education: Not on file   Highest education level: Not on file  Occupational History   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    Associate Professor: RETIRED  Tobacco Use   Smoking status: Never    Passive exposure: Never   Smokeless tobacco: Never  Vaping Use   Vaping status: Never Used  Substance and Sexual Activity   Alcohol use: No   Drug use: No   Sexual activity: Not on file  Other Topics Concern   Not on file  Social History Narrative   Patient is separated from spouse   Social Determinants of Health   Financial Resource Strain: Not on file  Food Insecurity: Not on file  Transportation Needs: Not on file  Physical Activity: Not on file  Stress: Not on file  Social Connections: Unknown (05/07/2022)   Received from [facility]   Social Network    Social Network: Not on file  Intimate Partner Violence: Unknown (03/28/2022)   Received from [facility]   HITS    Physically Hurt: Not on file    Insult or Talk Down To: Not on file    Threaten Physical Harm: Not on file    Scream or Curse: Not on file    Family History  Problem Relation Age of Onset   Heart disease Mother    Kidney failure Sister    Breast cancer Sister    Lung cancer Brother    Brain cancer Sister    Asthma Sister    Colon cancer Neg Hx      Review of Systems  Constitutional: Negative.  Negative for chills and fever.  HENT: Negative.  Negative for congestion and sore throat.   Respiratory: Negative.  Negative for cough and shortness of breath.   Cardiovascular: Negative.  Negative for chest pain and palpitations.   Gastrointestinal:  Negative for abdominal pain, diarrhea, nausea and vomiting.  Genitourinary: Negative.  Negative for dysuria and hematuria.  Musculoskeletal:  Positive for back pain.  Skin: Negative.  Negative for rash.  Neurological: Negative.  Negative for dizziness and headaches.  All other systems reviewed and are negative.   Vitals:   09/03/23 1043  BP: 120/74  Pulse: 72  Temp: 97.6 F (36.4 C)  SpO2: 98%    Physical Exam Vitals reviewed.  Constitutional:      Appearance: Normal appearance.  HENT:     Head: Normocephalic.  Eyes:     Extraocular Movements: Extraocular movements  intact.  Cardiovascular:     Rate and Rhythm: Normal rate and regular rhythm.     Pulses: Normal pulses.     Heart sounds: Normal heart sounds.  Pulmonary:     Effort: Pulmonary effort is normal.     Breath sounds: Normal breath sounds.  Abdominal:     Palpations: Abdomen is soft.     Tenderness: There is no abdominal tenderness.  Musculoskeletal:     Cervical back: No tenderness.     Right lower leg: No edema.     Left lower leg: No edema.     Comments: Lower extremities/feet: Neurovascularly intact.  No erythema or swelling.  No tenderness.  No skin breakdown or ulcers.  Warm to touch.  Excellent capillary refill.  Good distal sensation.  No signs of peripheral arterial disease or chronic venous insufficiency.  Lymphadenopathy:     Cervical: No cervical adenopathy.  Skin:    General: Skin is warm and dry.  Neurological:     Mental Status: He is alert and oriented to person, place, and time.  Psychiatric:        Mood and Affect: Mood normal.        Behavior: Behavior normal.      ASSESSMENT & PLAN: A total of 47 minutes was spent with the patient and counseling/coordination of care regarding preparing for this visit, review of most recent office visit notes, review of most recent pulmonary doctors office visit notes, review of multiple chronic medical conditions under management,  review of all medications, review of most recent blood work results, cardiovascular risks associated with hypertension, review of health maintenance items, prognosis, documentation, and need for follow-up.  Problem List Items Addressed This Visit       Cardiovascular and Mediastinum   Essential hypertension - Primary    BP Readings from Last 3 Encounters:  09/03/23 120/74  08/29/23 (!) 134/90  07/23/23 134/78  Well-controlled hypertension Continue amlodipine 5 mg daily Cardiovascular risk associated with hypertension discussed Dietary approaches to stop hypertension discussed         Respiratory   Severe persistent asthma    Stable and well-controlled on Symbicort 2 puffs twice a day and albuterol as rescue inhaler Was able to follow-up recently with pulmonary doctor Office visit assessment and plan reviewed        Genitourinary   Stage 3a chronic kidney disease (HCC)    Chronic stable condition Advised to stay well-hydrated and avoid NSAIDs        Other   Dyslipidemia    Chronic stable condition Presently on no medication      Prediabetes    Stable chronic condition Diet and nutrition discussed      Neurogenic claudication    Most likely secondary to spinal stenosis Needs lumbar spine MRI and orthopedic evaluation Both requested during last visit      Bilateral leg pain    No clinical findings of PAD or chronic venous insufficiency Most likely related to spinal stenosis      Patient Instructions  Health Maintenance After Age 37 After age 64, you are at a higher risk for certain long-term diseases and infections as well as injuries from falls. Falls are a major cause of broken bones and head injuries in people who are older than age 52. Getting regular preventive care can help to keep you healthy and well. Preventive care includes getting regular testing and making lifestyle changes as recommended by your health care provider. Talk with your health  care  provider about: Which screenings and tests you should have. A screening is a test that checks for a disease when you have no symptoms. A diet and exercise plan that is right for you. What should I know about screenings and tests to prevent falls? Screening and testing are the best ways to find a health problem early. Early diagnosis and treatment give you the best chance of managing medical conditions that are common after age 16. Certain conditions and lifestyle choices may make you more likely to have a fall. Your health care provider may recommend: Regular vision checks. Poor vision and conditions such as cataracts can make you more likely to have a fall. If you wear glasses, make sure to get your prescription updated if your vision changes. Medicine review. Work with your health care provider to regularly review all of the medicines you are taking, including over-the-counter medicines. Ask your health care provider about any side effects that may make you more likely to have a fall. Tell your health care provider if any medicines that you take make you feel dizzy or sleepy. Strength and balance checks. Your health care provider may recommend certain tests to check your strength and balance while standing, walking, or changing positions. Foot health exam. Foot pain and numbness, as well as not wearing proper footwear, can make you more likely to have a fall. Screenings, including: Osteoporosis screening. Osteoporosis is a condition that causes the bones to get weaker and break more easily. Blood pressure screening. Blood pressure changes and medicines to control blood pressure can make you feel dizzy. Depression screening. You may be more likely to have a fall if you have a fear of falling, feel depressed, or feel unable to do activities that you used to do. Alcohol use screening. Using too much alcohol can affect your balance and may make you more likely to have a fall. Follow these instructions at  home: Lifestyle Do not drink alcohol if: Your health care provider tells you not to drink. If you drink alcohol: Limit how much you have to: 0-1 drink a day for women. 0-2 drinks a day for men. Know how much alcohol is in your drink. In the [place] one drink equals one 12 oz bottle of beer (355 mL), one 5 oz glass of wine (148 mL), or one 1 oz glass of hard liquor (44 mL). Do not use any products that contain nicotine or tobacco. These products include cigarettes, chewing tobacco, and vaping devices, such as e-cigarettes. If you need help quitting, ask your health care provider. Activity  Follow a regular exercise program to stay fit. This will help you maintain your balance. Ask your health care provider what types of exercise are appropriate for you. If you need a cane or walker, use it as recommended by your health care provider. Wear supportive shoes that have nonskid soles. Safety  Remove any tripping hazards, such as rugs, cords, and clutter. Install safety equipment such as grab bars in bathrooms and safety rails on stairs. Keep rooms and walkways well-lit. General instructions Talk with your health care provider about your risks for falling. Tell your health care provider if: You fall. Be sure to tell your health care provider about all falls, even ones that seem minor. You feel dizzy, tiredness (fatigue), or off-balance. Take over-the-counter and prescription medicines only as told by your health care provider. These include supplements. Eat a healthy diet and maintain a healthy weight. A healthy diet includes low-fat dairy products,  low-fat (lean) meats, and fiber from whole grains, beans, and lots of fruits and vegetables. Stay current with your vaccines. Schedule regular health, dental, and eye exams. Summary Having a healthy lifestyle and getting preventive care can help to protect your health and wellness after age 66. Screening and testing are the best way to find a health  problem early and help you avoid having a fall. Early diagnosis and treatment give you the best chance for managing medical conditions that are more common for people who are older than age 71. Falls are a major cause of broken bones and head injuries in people who are older than age 74. Take precautions to prevent a fall at home. Work with your health care provider to learn what changes you can make to improve your health and wellness and to prevent falls. This information is not intended to replace advice given to you by your health care provider. Make sure you discuss any questions you have with your health care provider. Document Revised: 04/30/2021 Document Reviewed: 04/30/2021 Elsevier Patient Education  2024 Elsevier Inc.     Edwina Barth, MD [facility]

## 2023-09-03 NOTE — Assessment & Plan Note (Signed)
Stable and well-controlled on Symbicort 2 puffs twice a day and albuterol as rescue inhaler Was able to follow-up recently with pulmonary doctor Office visit assessment and plan reviewed

## 2023-09-03 NOTE — Assessment & Plan Note (Signed)
Chronic stable condition. Advised to stay well-hydrated and avoid NSAIDs

## 2023-09-03 NOTE — Assessment & Plan Note (Signed)
Most likely secondary to spinal stenosis Needs lumbar spine MRI and orthopedic evaluation Both requested during last visit

## 2023-09-03 NOTE — Assessment & Plan Note (Signed)
No clinical findings of PAD or chronic venous insufficiency Most likely related to spinal stenosis

## 2023-09-03 NOTE — Patient Instructions (Signed)
Health Maintenance After Age 83 After age 83, you are at a higher risk for certain long-term diseases and infections as well as injuries from falls. Falls are a major cause of broken bones and head injuries in people who are older than age 83. Getting regular preventive care can help to keep you healthy and well. Preventive care includes getting regular testing and making lifestyle changes as recommended by your health care provider. Talk with your health care provider about: Which screenings and tests you should have. A screening is a test that checks for a disease when you have no symptoms. A diet and exercise plan that is right for you. What should I know about screenings and tests to prevent falls? Screening and testing are the best ways to find a health problem early. Early diagnosis and treatment give you the best chance of managing medical conditions that are common after age 83. Certain conditions and lifestyle choices may make you more likely to have a fall. Your health care provider may recommend: Regular vision checks. Poor vision and conditions such as cataracts can make you more likely to have a fall. If you wear glasses, make sure to get your prescription updated if your vision changes. Medicine review. Work with your health care provider to regularly review all of the medicines you are taking, including over-the-counter medicines. Ask your health care provider about any side effects that may make you more likely to have a fall. Tell your health care provider if any medicines that you take make you feel dizzy or sleepy. Strength and balance checks. Your health care provider may recommend certain tests to check your strength and balance while standing, walking, or changing positions. Foot health exam. Foot pain and numbness, as well as not wearing proper footwear, can make you more likely to have a fall. Screenings, including: Osteoporosis screening. Osteoporosis is a condition that causes  the bones to get weaker and break more easily. Blood pressure screening. Blood pressure changes and medicines to control blood pressure can make you feel dizzy. Depression screening. You may be more likely to have a fall if you have a fear of falling, feel depressed, or feel unable to do activities that you used to do. Alcohol use screening. Using too much alcohol can affect your balance and may make you more likely to have a fall. Follow these instructions at home: Lifestyle Do not drink alcohol if: Your health care provider tells you not to drink. If you drink alcohol: Limit how much you have to: 0-1 drink a day for women. 0-2 drinks a day for men. Know how much alcohol is in your drink. In the [place] one drink equals one 12 oz bottle of beer (355 mL), one 5 oz glass of wine (148 mL), or one 1 oz glass of hard liquor (44 mL). Do not use any products that contain nicotine or tobacco. These products include cigarettes, chewing tobacco, and vaping devices, such as e-cigarettes. If you need help quitting, ask your health care provider. Activity  Follow a regular exercise program to stay fit. This will help you maintain your balance. Ask your health care provider what types of exercise are appropriate for you. If you need a cane or walker, use it as recommended by your health care provider. Wear supportive shoes that have nonskid soles. Safety  Remove any tripping hazards, such as rugs, cords, and clutter. Install safety equipment such as grab bars in bathrooms and safety rails on stairs. Keep rooms and walkways   well-lit. General instructions Talk with your health care provider about your risks for falling. Tell your health care provider if: You fall. Be sure to tell your health care provider about all falls, even ones that seem minor. You feel dizzy, tiredness (fatigue), or off-balance. Take over-the-counter and prescription medicines only as told by your health care provider. These include  supplements. Eat a healthy diet and maintain a healthy weight. A healthy diet includes low-fat dairy products, low-fat (lean) meats, and fiber from whole grains, beans, and lots of fruits and vegetables. Stay current with your vaccines. Schedule regular health, dental, and eye exams. Summary Having a healthy lifestyle and getting preventive care can help to protect your health and wellness after age 83. Screening and testing are the best way to find a health problem early and help you avoid having a fall. Early diagnosis and treatment give you the best chance for managing medical conditions that are more common for people who are older than age 83. Falls are a major cause of broken bones and head injuries in people who are older than age 83. Take precautions to prevent a fall at home. Work with your health care provider to learn what changes you can make to improve your health and wellness and to prevent falls. This information is not intended to replace advice given to you by your health care provider. Make sure you discuss any questions you have with your health care provider. Document Revised: 04/30/2021 Document Reviewed: 04/30/2021 Elsevier Patient Education  2024 Elsevier Inc.  

## 2023-09-03 NOTE — Assessment & Plan Note (Signed)
Stable chronic condition. Diet and nutrition discussed.

## 2023-09-03 NOTE — Assessment & Plan Note (Signed)
BP Readings from Last 3 Encounters:  09/03/23 120/74  08/29/23 (!) 134/90  07/23/23 134/78  Well-controlled hypertension Continue amlodipine 5 mg daily Cardiovascular risk associated with hypertension discussed Dietary approaches to stop hypertension discussed

## 2023-09-03 NOTE — Assessment & Plan Note (Signed)
Chronic stable condition Presently on no medication

## 2023-09-05 DIAGNOSIS — M9903 Segmental and somatic dysfunction of lumbar region: Secondary | ICD-10-CM | POA: Diagnosis not present

## 2023-09-05 DIAGNOSIS — M62830 Muscle spasm of back: Secondary | ICD-10-CM | POA: Diagnosis not present

## 2023-09-05 DIAGNOSIS — M9901 Segmental and somatic dysfunction of cervical region: Secondary | ICD-10-CM | POA: Diagnosis not present

## 2023-09-05 DIAGNOSIS — M9902 Segmental and somatic dysfunction of thoracic region: Secondary | ICD-10-CM | POA: Diagnosis not present

## 2023-09-09 DIAGNOSIS — M62830 Muscle spasm of back: Secondary | ICD-10-CM | POA: Diagnosis not present

## 2023-09-09 DIAGNOSIS — M9902 Segmental and somatic dysfunction of thoracic region: Secondary | ICD-10-CM | POA: Diagnosis not present

## 2023-09-09 DIAGNOSIS — M9901 Segmental and somatic dysfunction of cervical region: Secondary | ICD-10-CM | POA: Diagnosis not present

## 2023-09-09 DIAGNOSIS — M9903 Segmental and somatic dysfunction of lumbar region: Secondary | ICD-10-CM | POA: Diagnosis not present

## 2023-09-10 DIAGNOSIS — M62830 Muscle spasm of back: Secondary | ICD-10-CM | POA: Diagnosis not present

## 2023-09-10 DIAGNOSIS — M9903 Segmental and somatic dysfunction of lumbar region: Secondary | ICD-10-CM | POA: Diagnosis not present

## 2023-09-10 DIAGNOSIS — M9901 Segmental and somatic dysfunction of cervical region: Secondary | ICD-10-CM | POA: Diagnosis not present

## 2023-09-10 DIAGNOSIS — M9902 Segmental and somatic dysfunction of thoracic region: Secondary | ICD-10-CM | POA: Diagnosis not present

## 2023-09-12 DIAGNOSIS — M9901 Segmental and somatic dysfunction of cervical region: Secondary | ICD-10-CM | POA: Diagnosis not present

## 2023-09-12 DIAGNOSIS — M9903 Segmental and somatic dysfunction of lumbar region: Secondary | ICD-10-CM | POA: Diagnosis not present

## 2023-09-12 DIAGNOSIS — M62830 Muscle spasm of back: Secondary | ICD-10-CM | POA: Diagnosis not present

## 2023-09-12 DIAGNOSIS — M9902 Segmental and somatic dysfunction of thoracic region: Secondary | ICD-10-CM | POA: Diagnosis not present

## 2023-09-16 DIAGNOSIS — M9902 Segmental and somatic dysfunction of thoracic region: Secondary | ICD-10-CM | POA: Diagnosis not present

## 2023-09-16 DIAGNOSIS — M62830 Muscle spasm of back: Secondary | ICD-10-CM | POA: Diagnosis not present

## 2023-09-16 DIAGNOSIS — M9901 Segmental and somatic dysfunction of cervical region: Secondary | ICD-10-CM | POA: Diagnosis not present

## 2023-09-16 DIAGNOSIS — M9903 Segmental and somatic dysfunction of lumbar region: Secondary | ICD-10-CM | POA: Diagnosis not present

## 2023-09-17 DIAGNOSIS — M62830 Muscle spasm of back: Secondary | ICD-10-CM | POA: Diagnosis not present

## 2023-09-17 DIAGNOSIS — M9903 Segmental and somatic dysfunction of lumbar region: Secondary | ICD-10-CM | POA: Diagnosis not present

## 2023-09-17 DIAGNOSIS — M9901 Segmental and somatic dysfunction of cervical region: Secondary | ICD-10-CM | POA: Diagnosis not present

## 2023-09-17 DIAGNOSIS — M9902 Segmental and somatic dysfunction of thoracic region: Secondary | ICD-10-CM | POA: Diagnosis not present

## 2023-09-18 DIAGNOSIS — M9903 Segmental and somatic dysfunction of lumbar region: Secondary | ICD-10-CM | POA: Diagnosis not present

## 2023-09-18 DIAGNOSIS — M62830 Muscle spasm of back: Secondary | ICD-10-CM | POA: Diagnosis not present

## 2023-09-18 DIAGNOSIS — M9901 Segmental and somatic dysfunction of cervical region: Secondary | ICD-10-CM | POA: Diagnosis not present

## 2023-09-18 DIAGNOSIS — M9902 Segmental and somatic dysfunction of thoracic region: Secondary | ICD-10-CM | POA: Diagnosis not present

## 2023-09-19 DIAGNOSIS — M9903 Segmental and somatic dysfunction of lumbar region: Secondary | ICD-10-CM | POA: Diagnosis not present

## 2023-09-19 DIAGNOSIS — M9901 Segmental and somatic dysfunction of cervical region: Secondary | ICD-10-CM | POA: Diagnosis not present

## 2023-09-19 DIAGNOSIS — M9902 Segmental and somatic dysfunction of thoracic region: Secondary | ICD-10-CM | POA: Diagnosis not present

## 2023-09-19 DIAGNOSIS — M62830 Muscle spasm of back: Secondary | ICD-10-CM | POA: Diagnosis not present

## 2023-09-23 DIAGNOSIS — M9901 Segmental and somatic dysfunction of cervical region: Secondary | ICD-10-CM | POA: Diagnosis not present

## 2023-09-23 DIAGNOSIS — M9902 Segmental and somatic dysfunction of thoracic region: Secondary | ICD-10-CM | POA: Diagnosis not present

## 2023-09-23 DIAGNOSIS — M9903 Segmental and somatic dysfunction of lumbar region: Secondary | ICD-10-CM | POA: Diagnosis not present

## 2023-09-23 DIAGNOSIS — M62830 Muscle spasm of back: Secondary | ICD-10-CM | POA: Diagnosis not present

## 2023-09-29 DIAGNOSIS — M62830 Muscle spasm of back: Secondary | ICD-10-CM | POA: Diagnosis not present

## 2023-09-29 DIAGNOSIS — M9903 Segmental and somatic dysfunction of lumbar region: Secondary | ICD-10-CM | POA: Diagnosis not present

## 2023-09-29 DIAGNOSIS — M9901 Segmental and somatic dysfunction of cervical region: Secondary | ICD-10-CM | POA: Diagnosis not present

## 2023-09-29 DIAGNOSIS — M9902 Segmental and somatic dysfunction of thoracic region: Secondary | ICD-10-CM | POA: Diagnosis not present

## 2023-10-01 DIAGNOSIS — M9901 Segmental and somatic dysfunction of cervical region: Secondary | ICD-10-CM | POA: Diagnosis not present

## 2023-10-01 DIAGNOSIS — M62830 Muscle spasm of back: Secondary | ICD-10-CM | POA: Diagnosis not present

## 2023-10-01 DIAGNOSIS — M9903 Segmental and somatic dysfunction of lumbar region: Secondary | ICD-10-CM | POA: Diagnosis not present

## 2023-10-01 DIAGNOSIS — M9902 Segmental and somatic dysfunction of thoracic region: Secondary | ICD-10-CM | POA: Diagnosis not present

## 2023-10-02 DIAGNOSIS — M62830 Muscle spasm of back: Secondary | ICD-10-CM | POA: Diagnosis not present

## 2023-10-02 DIAGNOSIS — M9902 Segmental and somatic dysfunction of thoracic region: Secondary | ICD-10-CM | POA: Diagnosis not present

## 2023-10-02 DIAGNOSIS — M9901 Segmental and somatic dysfunction of cervical region: Secondary | ICD-10-CM | POA: Diagnosis not present

## 2023-10-02 DIAGNOSIS — M9903 Segmental and somatic dysfunction of lumbar region: Secondary | ICD-10-CM | POA: Diagnosis not present

## 2023-10-03 DIAGNOSIS — M9903 Segmental and somatic dysfunction of lumbar region: Secondary | ICD-10-CM | POA: Diagnosis not present

## 2023-10-03 DIAGNOSIS — M9901 Segmental and somatic dysfunction of cervical region: Secondary | ICD-10-CM | POA: Diagnosis not present

## 2023-10-03 DIAGNOSIS — M62830 Muscle spasm of back: Secondary | ICD-10-CM | POA: Diagnosis not present

## 2023-10-03 DIAGNOSIS — M9902 Segmental and somatic dysfunction of thoracic region: Secondary | ICD-10-CM | POA: Diagnosis not present

## 2023-10-07 DIAGNOSIS — M9901 Segmental and somatic dysfunction of cervical region: Secondary | ICD-10-CM | POA: Diagnosis not present

## 2023-10-07 DIAGNOSIS — M9903 Segmental and somatic dysfunction of lumbar region: Secondary | ICD-10-CM | POA: Diagnosis not present

## 2023-10-07 DIAGNOSIS — M62830 Muscle spasm of back: Secondary | ICD-10-CM | POA: Diagnosis not present

## 2023-10-07 DIAGNOSIS — M9902 Segmental and somatic dysfunction of thoracic region: Secondary | ICD-10-CM | POA: Diagnosis not present

## 2023-12-02 ENCOUNTER — Ambulatory Visit (INDEPENDENT_AMBULATORY_CARE_PROVIDER_SITE_OTHER): Payer: Medicare Other

## 2023-12-02 VITALS — Ht 66.0 in | Wt 186.0 lb

## 2023-12-02 DIAGNOSIS — Z0000 Encounter for general adult medical examination without abnormal findings: Secondary | ICD-10-CM | POA: Diagnosis not present

## 2023-12-02 NOTE — Patient Instructions (Signed)
[name] , Thank you for taking time to come for your Medicare Wellness Visit. I appreciate your ongoing commitment to your health goals. Please review the following plan we discussed and let me know if I can assist you in the future.   Referrals/Orders/Follow-Ups/Clinician Recommendations: It was nice to talk with you today.  Keep up the good work and [employer] to you.  This is a list of the screening recommended for you and due dates:  Health Maintenance  Topic Date Due   Zoster (Shingles) Vaccine (1 of 2) 03/01/2024*   DTaP/Tdap/Td vaccine (2 - Td or Tdap) 07/25/2024   Medicare Annual Wellness Visit  12/01/2024   Pneumonia Vaccine  Completed   HPV Vaccine  Aged Out   Flu Shot  Discontinued   COVID-19 Vaccine  Discontinued  *Topic was postponed. The date shown is not the original due date.    Advanced directives: (Declined) Advance directive discussed with you today. Even though you declined this today, please call our office should you change your mind, and we can give you the proper paperwork for you to fill out.  Next Medicare Annual Wellness Visit scheduled for next year: Yes

## 2023-12-02 NOTE — Progress Notes (Signed)
Subjective:   [name] is a 82 y.o. male who presents for Medicare Annual/Subsequent preventive examination.  Visit Complete: Virtual I connected with  [name] on 12/02/23 by a audio enabled telemedicine application and verified that I am speaking with the correct person using two identifiers.  Patient Location: Home  Provider Location: Home Office  I discussed the limitations of evaluation and management by telemedicine. The patient expressed understanding and agreed to proceed.  Vital Signs: Because this visit was a virtual/telehealth visit, some criteria may be missing or patient reported. Any vitals not documented were not able to be obtained and vitals that have been documented are patient reported.   Cardiac Risk Factors include: advanced age (>53men, >50 women);hypertension;male gender;Other (see comment);dyslipidemia, Risk factor comments: CKD     Objective:    Today's Vitals   12/02/23 1103  Weight: 186 lb (84.4 kg)  Height: 5\' 6"  (1.676 m)   Body mass index is 30.02 kg/m.     12/02/2023   11:23 AM 03/23/2019    6:08 AM 08/14/2016    8:27 AM 05/06/2016    9:57 AM 03/10/2016    4:10 AM 09/28/2015    8:46 AM 09/11/2015    8:21 AM  Advanced Directives  Does Patient Have a Medical Advance Directive? No No No No No No No  Would patient like information on creating a medical advance directive?  No - Patient declined No - patient declined information  Yes - Educational materials given  No - patient declined information    Current Medications (verified) Outpatient Encounter Medications as of 12/02/2023  Medication Sig   albuterol (PROVENTIL) (2.5 MG/3ML) 0.083% nebulizer solution USE 1 VIAL IN NEBULIZER EVERY 4 HOURS AS NEEDED   amLODipine (NORVASC) 5 MG tablet Take 1 tablet (5 mg total) by mouth daily.   Ascorbic Acid (VITAMIN C) 1000 MG tablet Take 1,000 mg by mouth daily.   aspirin 81 MG EC tablet Take 81 mg by mouth every morning.    budesonide-formoterol (SYMBICORT) 160-4.5 MCG/ACT inhaler TAKE 2 PUFFS FIRST THING IN AM AND THEN ANOTHER 2 PUFFS ABOUT 12 HOURS LATER.   CHELATED MAGNESIUM PO Take 1 tablet by mouth daily.   cholecalciferol (VITAMIN D3) 25 MCG (1000 UT) tablet Take 1,000 Units by mouth daily.   cyanocobalamin 1000 MCG tablet Take 1,000 mcg by mouth daily.   famotidine (PEPCID) 20 MG tablet Take 20 mg by mouth daily.   Ibuprofen 200 MG CAPS Take 200 mg by mouth every 6 (six) hours as needed (headache/pain).    meclizine (ANTIVERT) 12.5 MG tablet Take 1 tablet by mouth 4 times a day as needed for dizziness   oxymetazoline (AFRIN) 0.05 % nasal spray Place 1 spray into both nostrils 2 (two) times daily as needed for congestion.   Polyvinyl Alcohol-Povidone (REFRESH OP) Apply 1 drop to eye as needed (dry eyes).    Respiratory Therapy Supplies (FLUTTER) DEVI Use every 4 hours as needed for cough and congestion   No facility-administered encounter medications on file as of 12/02/2023.    Allergies (verified) Patient has no known allergies.   History: Past Medical History:  Diagnosis Date   Acid reflux    Asthma 06/18/2011   Borderline hypertension    Cataract    Colon polyps 2011   1 Hyperplastic and 1 Tubular Adenoma    Dysphagia    Esophageal stricture    GERD (gastroesophageal reflux disease)    Hx of adenomatous colonic polyps 06/25/2015   Personal  history of colonic polyps 12/1989   Shingles    Shoulder pain, right    Unspecified asthma(493.90)    Urinary frequency    Past Surgical History:  Procedure Laterality Date   BALLOON DILATION N/A 02/07/2015   Procedure: BALLOON DILATION;  Surgeon: Iva Boop, MD;  Location: [facility];  Service: Endoscopy;  Laterality: N/A;   ESOPHAGOGASTRODUODENOSCOPY (EGD) WITH PROPOFOL N/A 02/07/2015   Procedure: ESOPHAGOGASTRODUODENOSCOPY (EGD) WITH PROPOFOL;  Surgeon: Iva Boop, MD;  Location: [facility];  Service: Endoscopy;  Laterality: N/A;    HEMORRHOID SURGERY     HERNIA REPAIR     MASS EXCISION N/A 08/14/2016   Procedure: EXCISION SEBACEOUS CYST BACK;  Surgeon: Luretha Murphy, MD;  Location: [facility];  Service: General;  Laterality: N/A;  EXCISION SEBACEOUS CYST BACK   TONSILLECTOMY     Family History  Problem Relation Age of Onset   Heart disease Mother    Kidney failure Sister    Breast cancer Sister    Lung cancer Brother    Brain cancer Sister    Asthma Sister    Colon cancer Neg Hx    Social History   Socioeconomic History   Marital status: Significant Other    Spouse name: Not on file   Number of children: 7   Years of education: Not on file   Highest education level: Not on file  Occupational History   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    Associate Professor: RETIRED  Tobacco Use   Smoking status: Never    Passive exposure: Never   Smokeless tobacco: Never  Vaping Use   Vaping status: Never Used  Substance and Sexual Activity   Alcohol use: No   Drug use: No   Sexual activity: Not on file  Other Topics Concern   Not on file  Social History Narrative   Patient is separated from spouse   Lives alone   Social Determinants of Health   Financial Resource Strain: Low Risk  (12/02/2023)   Overall Financial Resource Strain (CARDIA)    Difficulty of Paying Living Expenses: Not hard at all  Food Insecurity: No Food Insecurity (12/02/2023)   Hunger Vital Sign    Worried About Running Out of Food in the Last Year: Never true    Ran Out of Food in the Last Year: Never true  Transportation Needs: No Transportation Needs (12/02/2023)   PRAPARE - Administrator, Civil Service (Medical): No    Lack of Transportation (Non-Medical): No  Physical Activity: Sufficiently Active (12/02/2023)   Exercise Vital Sign    Days of Exercise per Week: 7 days    Minutes of Exercise per Session: 30 min  Stress: No Stress Concern Present (12/02/2023)   [employer] of Occupational Health  - Occupational Stress Questionnaire    Feeling of Stress : Not at all  Social Connections: Moderately Integrated (12/02/2023)   Social Connection and Isolation Panel [NHANES]    Frequency of Communication with Friends and Family: More than three times a week    Frequency of Social Gatherings with Friends and Family: More than three times a week    Attends Religious Services: More than 4 times per year    Active Member of [employer] or Organizations: Yes    Attends Banker Meetings: Never    Marital Status: Divorced    Tobacco Counseling Counseling given: Not Answered   Clinical Intake:  Pre-visit preparation completed: Yes  Pain :  No/denies pain     BMI - recorded: 30.02 Nutritional Status: BMI > 30  Obese Nutritional Risks: None Diabetes: No  How often do you need to have someone help you when you read instructions, pamphlets, or other written materials from your doctor or pharmacy?: 1 - Never  Interpreter Needed?: No  Information entered by :: Derion Kreiter, RMA   Activities of Daily Living    12/02/2023   11:04 AM  In your present state of health, do you have any difficulty performing the following activities:  Hearing? 0  Vision? 0  Difficulty concentrating or making decisions? 0  Walking or climbing stairs? 0  Dressing or bathing? 0  Doing errands, shopping? 0  Preparing Food and eating ? N  Using the Toilet? N  In the past six months, have you accidently leaked urine? N  Do you have problems with loss of bowel control? N  Managing your Medications? N  Managing your Finances? N  Housekeeping or managing your Housekeeping? N    Patient Care Team: Georgina Quint, MD as PCP - General (Internal Medicine)  Indicate any recent Medical Services you may have received from other than [facility] providers in the past year (date may be approximate).     Assessment:   This is a routine wellness examination for Abishai.  Hearing/Vision screen Hearing  Screening - Comments:: Denies hearing difficulties   Vision Screening - Comments:: Wears eyeglasses   Goals Addressed               This Visit's Progress     Patient Stated (pt-stated)        Stay healthy and maintain independance      Depression Screen    12/02/2023   11:29 AM 09/03/2023   10:44 AM 07/23/2023    1:52 PM 03/03/2023   10:49 AM 01/15/2023   10:23 AM 09/02/2022   10:47 AM 06/05/2022   10:29 AM  PHQ 2/9 Scores  PHQ - 2 Score 0 0 0 0 0 0 0  PHQ- 9 Score 0     0     Fall Risk    12/02/2023   11:23 AM 09/03/2023   10:44 AM 07/23/2023    1:52 PM 03/03/2023   10:49 AM 01/15/2023   10:23 AM  Fall Risk   Falls in the past year? 0 0 0 0 0  Number falls in past yr: 0 0 0 0 0  Injury with Fall? 0 0 0 0 0  Risk for fall due to : No Fall Risks No Fall Risks No Fall Risks No Fall Risks No Fall Risks  Follow up Falls prevention discussed;Falls evaluation completed Falls evaluation completed Falls evaluation completed Falls evaluation completed Falls evaluation completed    MEDICARE RISK AT HOME: Medicare Risk at Home Any stairs in or around the home?: No Home free of loose throw rugs in walkways, pet beds, electrical cords, etc?: Yes Adequate lighting in your home to reduce risk of falls?: Yes Life alert?: No Use of a cane, walker or w/c?: No Grab bars in the bathroom?: Yes Shower chair or bench in shower?: No Elevated toilet seat or a handicapped toilet?: No  TIMED UP AND GO:  Was the test performed?  No    Cognitive Function:        12/02/2023   11:04 AM  6CIT Screen  What Year? 0 points  What month? 0 points  What time? 0 points  Count back from 20 0 points  Months in reverse 0 points  Repeat phrase 0 points  Total Score 0 points    Immunizations Immunization History  Administered Date(s) Administered   Pneumococcal Conjugate-13 07/25/2014   Pneumococcal Polysaccharide-23 12/23/2008, 03/20/2021   Tdap 07/25/2014    TDAP status: Up to  date  Flu Vaccine status: Declined, Education has been provided regarding the importance of this vaccine but patient still declined. Advised may receive this vaccine at local pharmacy or Health Dept. Aware to provide a copy of the vaccination record if obtained from local pharmacy or Health Dept. Verbalized acceptance and understanding.  Pneumococcal vaccine status: Up to date  Covid-19 vaccine status: Declined, Education has been provided regarding the importance of this vaccine but patient still declined. Advised may receive this vaccine at local pharmacy or Health Dept.or vaccine clinic. Aware to provide a copy of the vaccination record if obtained from local pharmacy or Health Dept. Verbalized acceptance and understanding.  Qualifies for Shingles Vaccine? Yes   Zostavax completed No   Shingrix Completed?: No.    Education has been provided regarding the importance of this vaccine. Patient has been advised to call insurance company to determine out of pocket expense if they have not yet received this vaccine. Advised may also receive vaccine at local pharmacy or Health Dept. Verbalized acceptance and understanding.  Screening Tests Health Maintenance  Topic Date Due   Zoster Vaccines- Shingrix (1 of 2) 03/01/2024 (Originally [birth year]1990)   DTaP/Tdap/Td (2 - Td or Tdap) 07/25/2024   Medicare Annual Wellness (AWV)  12/01/2024   Pneumonia Vaccine 41+ Years old  Completed   HPV VACCINES  Aged Out   INFLUENZA VACCINE  Discontinued   COVID-19 Vaccine  Discontinued    Health Maintenance  There are no preventive care reminders to display for this patient.   Colorectal cancer screening: No longer required.   Lung Cancer Screening: (Low Dose CT Chest recommended if Age 26-80 years, 20 pack-year currently smoking OR have quit w/in 15years.) does not qualify.   Lung Cancer Screening Referral: N/A  Additional Screening:  Hepatitis C Screening: does not qualify;  Vision Screening:  Recommended annual ophthalmology exams for early detection of glaucoma and other disorders of the eye. Is the patient up to date with their annual eye exam?  Yes  Who is the provider or what is the name of the office in which the patient attends annual eye exams? Dr. Emily Filbert If pt is not established with a provider, would they like to be referred to a provider to establish care? No .   Dental Screening: Recommended annual dental exams for proper oral hygiene   Community Resource Referral / Chronic Care Management: CRR required this visit?  No   CCM required this visit?  No     Plan:     I have personally reviewed and noted the following in the patient's chart:   Medical and social history Use of alcohol, tobacco or illicit drugs  Current medications and supplements including opioid prescriptions. Patient is not currently taking opioid prescriptions. Functional ability and status Nutritional status Physical activity Advanced directives List of other physicians Hospitalizations, surgeries, and ER visits in previous 12 months Vitals Screenings to include cognitive, depression, and falls Referrals and appointments  In addition, I have reviewed and discussed with patient certain preventive protocols, quality metrics, and best practice recommendations. A written personalized care plan for preventive services as well as general preventive health recommendations were provided to patient.     Molly Savarino L Anessia [place], CMA  12/02/2023   After Visit Summary: (Mail) Due to this being a telephonic visit, the after visit summary with patients personalized plan was offered to patient via mail   Nurse Notes: Patient declines all vaccines that were due.  He is up to date on all other health maintenance.  Patient had no concerns to address today.

## 2023-12-03 ENCOUNTER — Ambulatory Visit: Payer: Medicare Other | Admitting: Emergency Medicine

## 2023-12-03 ENCOUNTER — Encounter: Payer: Self-pay | Admitting: Emergency Medicine

## 2023-12-03 VITALS — BP 124/74 | HR 69 | Temp 97.9°F | Ht 66.0 in | Wt 183.2 lb

## 2023-12-03 DIAGNOSIS — N1831 Chronic kidney disease, stage 3a: Secondary | ICD-10-CM

## 2023-12-03 DIAGNOSIS — I10 Essential (primary) hypertension: Secondary | ICD-10-CM

## 2023-12-03 DIAGNOSIS — E785 Hyperlipidemia, unspecified: Secondary | ICD-10-CM

## 2023-12-03 DIAGNOSIS — J4550 Severe persistent asthma, uncomplicated: Secondary | ICD-10-CM | POA: Diagnosis not present

## 2023-12-03 DIAGNOSIS — R7303 Prediabetes: Secondary | ICD-10-CM | POA: Diagnosis not present

## 2023-12-03 NOTE — Assessment & Plan Note (Signed)
Stable chronic condition. Diet and nutrition discussed.

## 2023-12-03 NOTE — Assessment & Plan Note (Signed)
BP Readings from Last 3 Encounters:  12/03/23 124/74  09/03/23 120/74  08/29/23 (!) 134/90  Well-controlled hypertension Continue amlodipine 5 mg daily Cardiovascular risks associated with hypertension discussed Diet and nutrition discussed

## 2023-12-03 NOTE — Progress Notes (Signed)
[name] 82 y.o.   Chief Complaint  Patient presents with   Follow-up    3 month f/u for HTN . No other concerns     HISTORY OF PRESENT ILLNESS: This is a 82 y.o. male here for 30-month follow-up of chronic medical conditions including hypertension Overall doing well.  Has no complaints or medical concerns today.  HPI   Prior to Admission medications   Medication Sig Start Date End Date Taking? Authorizing Provider  albuterol (PROVENTIL) (2.5 MG/3ML) 0.083% nebulizer solution USE 1 VIAL IN NEBULIZER EVERY 4 HOURS AS NEEDED 09/02/22  Yes Tarnisha Kachmar, Eilleen Kempf, MD  amLODipine (NORVASC) 5 MG tablet Take 1 tablet (5 mg total) by mouth daily. 05/08/22  Yes Jaclynne Baldo, Eilleen Kempf, MD  Ascorbic Acid (VITAMIN C) 1000 MG tablet Take 1,000 mg by mouth daily.   Yes [provider]  aspirin 81 MG EC tablet Take 81 mg by mouth every morning.   Yes [provider]  budesonide-formoterol (SYMBICORT) 160-4.5 MCG/ACT inhaler TAKE 2 PUFFS FIRST THING IN AM AND THEN ANOTHER 2 PUFFS ABOUT 12 HOURS LATER. 02/20/23  Yes Nyoka Cowden, MD  CHELATED MAGNESIUM PO Take 1 tablet by mouth daily.   Yes [provider]  cholecalciferol (VITAMIN D3) 25 MCG (1000 UT) tablet Take 1,000 Units by mouth daily.   Yes [provider]  cyanocobalamin 1000 MCG tablet Take 1,000 mcg by mouth daily.   Yes [provider]  famotidine (PEPCID) 20 MG tablet Take 20 mg by mouth daily.   Yes [provider]  Ibuprofen 200 MG CAPS Take 200 mg by mouth every 6 (six) hours as needed (headache/pain).    Yes [provider]  meclizine (ANTIVERT) 12.5 MG tablet Take 1 tablet by mouth 4 times a day as needed for dizziness 06/19/23  Yes Blanch Stang, Eilleen Kempf, MD  oxymetazoline (AFRIN) 0.05 % nasal spray Place 1 spray into both nostrils 2 (two) times daily as needed for congestion.   Yes [provider]  Polyvinyl Alcohol-Povidone (REFRESH OP) Apply 1 drop to eye as  needed (dry eyes).    Yes [provider]  Respiratory Therapy Supplies (FLUTTER) DEVI Use every 4 hours as needed for cough and congestion   Yes [provider]    No Known Allergies  Patient Active Problem List   Diagnosis Date Noted   Neurogenic claudication 07/23/2023   Sciatica, right side 03/03/2023   Paresthesia of both feet 01/15/2023   Prediabetes 01/15/2023   Stage 3a chronic kidney disease (HCC) 06/22/2017   Hx of adenomatous colonic polyps 06/25/2015   Essential hypertension 09/05/2011   Dyslipidemia 08/08/2011   Esophageal dysphagia 03/12/2011   GERD 11/06/2009   Seasonal and perennial allergic rhinitis 05/31/2009   Severe persistent asthma 01/04/2008    Past Medical History:  Diagnosis Date   Acid reflux    Asthma 06/18/2011   Borderline hypertension    Cataract    Colon polyps 2011   1 Hyperplastic and 1 Tubular Adenoma    Dysphagia    Esophageal stricture    GERD (gastroesophageal reflux disease)    Hx of adenomatous colonic polyps 06/25/2015   Personal history of colonic polyps 12/1989   Shingles    Shoulder pain, right    Unspecified asthma(493.90)    Urinary frequency     Past Surgical History:  Procedure Laterality Date   BALLOON DILATION N/A 02/07/2015   Procedure: BALLOON DILATION;  Surgeon: Iva Boop, MD;  Location: [facility];  Service: Endoscopy;  Laterality: N/A;   ESOPHAGOGASTRODUODENOSCOPY (EGD) WITH PROPOFOL N/A 02/07/2015   Procedure: ESOPHAGOGASTRODUODENOSCOPY (EGD) WITH PROPOFOL;  Surgeon: Iva Boop, MD;  Location: [facility];  Service: Endoscopy;  Laterality: N/A;   HEMORRHOID SURGERY     HERNIA REPAIR     MASS EXCISION N/A 08/14/2016   Procedure: EXCISION SEBACEOUS CYST BACK;  Surgeon: Luretha Murphy, MD;  Location: [facility];  Service: General;  Laterality: N/A;  EXCISION SEBACEOUS CYST BACK   TONSILLECTOMY      Social History   Socioeconomic History   Marital status: Significant Other     Spouse name: Not on file   Number of children: 7   Years of education: Not on file   Highest education level: Not on file  Occupational History   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    Associate Professor: RETIRED  Tobacco Use   Smoking status: Never    Passive exposure: Never   Smokeless tobacco: Never  Vaping Use   Vaping status: Never Used  Substance and Sexual Activity   Alcohol use: No   Drug use: No   Sexual activity: Not on file  Other Topics Concern   Not on file  Social History Narrative   Patient is separated from spouse   Lives alone   Social Determinants of Health   Financial Resource Strain: Low Risk  (12/02/2023)   Overall Financial Resource Strain (CARDIA)    Difficulty of Paying Living Expenses: Not hard at all  Food Insecurity: No Food Insecurity (12/02/2023)   Hunger Vital Sign    Worried About Running Out of Food in the Last Year: Never true    Ran Out of Food in the Last Year: Never true  Transportation Needs: No Transportation Needs (12/02/2023)   PRAPARE - Administrator, Civil Service (Medical): No    Lack of Transportation (Non-Medical): No  Physical Activity: Sufficiently Active (12/02/2023)   Exercise Vital Sign    Days of Exercise per Week: 7 days    Minutes of Exercise per Session: 30 min  Stress: No Stress Concern Present (12/02/2023)   [employer] of Occupational Health - Occupational Stress Questionnaire    Feeling of Stress : Not at all  Social Connections: Moderately Integrated (12/02/2023)   Social Connection and Isolation Panel [NHANES]    Frequency of Communication with Friends and Family: More than three times a week    Frequency of Social Gatherings with Friends and Family: More than three times a week    Attends Religious Services: More than 4 times per year    Active Member of [employer] or Organizations: Yes    Attends Banker Meetings: Never    Marital Status: Divorced  Catering manager Violence:  Patient Unable To Answer (12/02/2023)   Humiliation, Afraid, Rape, and Kick questionnaire    Fear of Current or Ex-Partner: Patient unable to answer    Emotionally Abused: Patient unable to answer    Physically Abused: Patient unable to answer    Sexually Abused: Patient unable to answer    Family History  Problem Relation Age of Onset   Heart disease Mother    Kidney failure Sister    Breast cancer Sister    Lung cancer Brother    Brain cancer Sister    Asthma Sister    Colon cancer Neg Hx      Review of Systems  Constitutional: Negative.  Negative for chills and fever.  HENT: Negative.  Negative for congestion and sore throat.   Respiratory: Negative.  Negative for cough and shortness of breath.   Cardiovascular: Negative.  Negative for chest pain and palpitations.  Gastrointestinal:  Negative for abdominal pain, diarrhea, nausea and vomiting.  Genitourinary: Negative.  Negative for dysuria and hematuria.  Skin: Negative.  Negative for rash.  Neurological: Negative.  Negative for dizziness and headaches.  All other systems reviewed and are negative.   Vitals:   12/03/23 1023  BP: 124/74  Pulse: 69  Temp: 97.9 F (36.6 C)  SpO2: 93%    Physical Exam Vitals reviewed.  Constitutional:      Appearance: Normal appearance.  HENT:     Head: Normocephalic.     Mouth/Throat:     Mouth: Mucous membranes are moist.     Pharynx: Oropharynx is clear.  Eyes:     Extraocular Movements: Extraocular movements intact.     Conjunctiva/sclera: Conjunctivae normal.     Pupils: Pupils are equal, round, and reactive to light.  Cardiovascular:     Rate and Rhythm: Normal rate and regular rhythm.     Pulses: Normal pulses.     Heart sounds: Normal heart sounds.  Pulmonary:     Effort: Pulmonary effort is normal.     Breath sounds: Normal breath sounds.  Musculoskeletal:     Cervical back: No tenderness.  Lymphadenopathy:     Cervical: No cervical adenopathy.  Skin:     General: Skin is warm and dry.     Capillary Refill: Capillary refill takes less than 2 seconds.  Neurological:     General: No focal deficit present.     Mental Status: He is alert and oriented to person, place, and time.  Psychiatric:        Mood and Affect: Mood normal.        Behavior: Behavior normal.      ASSESSMENT & PLAN: A total of 46 minutes was spent with the patient and counseling/coordination of care regarding preparing for this visit, review of most recent office visit notes, review of multiple chronic medical conditions and their management, cardiovascular risks associated with hypertension and dyslipidemia, review of all medications, review of most recent bloodwork results, review of health maintenance items, education on nutrition, prognosis, documentation, and need for follow up.   Problem List Items Addressed This Visit       Cardiovascular and Mediastinum   Essential hypertension - Primary    BP Readings from Last 3 Encounters:  12/03/23 124/74  09/03/23 120/74  08/29/23 (!) 134/90  Well-controlled hypertension Continue amlodipine 5 mg daily Cardiovascular risks associated with hypertension discussed Diet and nutrition discussed       Relevant Orders   Comprehensive metabolic panel   CBC with Differential/Platelet   Hemoglobin A1c   Lipid panel     Respiratory   Severe persistent asthma    Stable and well-controlled on Symbicort 2 puffs twice a day and albuterol as rescue inhaler Was able to follow-up recently with pulmonary doctor Office visit assessment and plan reviewed        Genitourinary   Stage 3a chronic kidney disease (HCC)    Chronic stable condition Advised to stay well-hydrated and avoid NSAIDs      Relevant Orders   Comprehensive metabolic panel   CBC with Differential/Platelet   Hemoglobin A1c   Lipid panel     Other   Dyslipidemia    Chronic stable condition Presently on no medication      Relevant  Orders    Comprehensive metabolic panel   CBC with Differential/Platelet   Hemoglobin A1c   Lipid panel   Prediabetes    Stable chronic condition Diet and nutrition discussed      Relevant Orders   Comprehensive metabolic panel   CBC with Differential/Platelet   Hemoglobin A1c   Lipid panel   Patient Instructions  Hypertension, Adult High blood pressure (hypertension) is when the force of blood pumping through the arteries is too strong. The arteries are the blood vessels that carry blood from the heart throughout the body. Hypertension forces the heart to work harder to pump blood and may cause arteries to become narrow or stiff. Untreated or uncontrolled hypertension can lead to a heart attack, heart failure, a stroke, kidney disease, and other problems. A blood pressure reading consists of a higher number over a lower number. Ideally, your blood pressure should be below 120/80. The first ("top") number is called the systolic pressure. It is a measure of the pressure in your arteries as your heart beats. The second ("bottom") number is called the diastolic pressure. It is a measure of the pressure in your arteries as the heart relaxes. What are the causes? The exact cause of this condition is not known. There are some conditions that result in high blood pressure. What increases the risk? Certain factors may make you more likely to develop high blood pressure. Some of these risk factors are under your control, including: Smoking. Not getting enough exercise or physical activity. Being overweight. Having too much fat, sugar, calories, or salt (sodium) in your diet. Drinking too much alcohol. Other risk factors include: Having a personal history of heart disease, diabetes, high cholesterol, or kidney disease. Stress. Having a family history of high blood pressure and high cholesterol. Having obstructive sleep apnea. Age. The risk increases with age. What are the signs or symptoms? High blood  pressure may not cause symptoms. Very high blood pressure (hypertensive crisis) may cause: Headache. Fast or irregular heartbeats (palpitations). Shortness of breath. Nosebleed. Nausea and vomiting. Vision changes. Severe chest pain, dizziness, and seizures. How is this diagnosed? This condition is diagnosed by measuring your blood pressure while you are seated, with your arm resting on a flat surface, your legs uncrossed, and your feet flat on the floor. The cuff of the blood pressure monitor will be placed directly against the skin of your upper arm at the level of your heart. Blood pressure should be measured at least twice using the same arm. Certain conditions can cause a difference in blood pressure between your right and left arms. If you have a high blood pressure reading during one visit or you have normal blood pressure with other risk factors, you may be asked to: Return on a different day to have your blood pressure checked again. Monitor your blood pressure at home for 1 week or longer. If you are diagnosed with hypertension, you may have other blood or imaging tests to help your health care provider understand your overall risk for other conditions. How is this treated? This condition is treated by making healthy lifestyle changes, such as eating healthy foods, exercising more, and reducing your alcohol intake. You may be referred for counseling on a healthy diet and physical activity. Your health care provider may prescribe medicine if lifestyle changes are not enough to get your blood pressure under control and if: Your systolic blood pressure is above 130. Your diastolic blood pressure is above 80. Your personal target blood pressure  may vary depending on your medical conditions, your age, and other factors. Follow these instructions at home: Eating and drinking  Eat a diet that is high in fiber and potassium, and low in sodium, added sugar, and fat. An example of this eating  plan is called the DASH diet. DASH stands for Dietary Approaches to Stop Hypertension. To eat this way: Eat plenty of fresh fruits and vegetables. Try to fill one half of your plate at each meal with fruits and vegetables. Eat whole grains, such as whole-wheat pasta, brown rice, or whole-grain bread. Fill about one fourth of your plate with whole grains. Eat or drink low-fat dairy products, such as skim milk or low-fat yogurt. Avoid fatty cuts of meat, processed or cured meats, and poultry with skin. Fill about one fourth of your plate with lean proteins, such as fish, chicken without skin, beans, eggs, or tofu. Avoid pre-made and processed foods. These tend to be higher in sodium, added sugar, and fat. Reduce your daily sodium intake. Many people with hypertension should eat less than 1,500 mg of sodium a day. Do not drink alcohol if: Your health care provider tells you not to drink. You are pregnant, may be pregnant, or are planning to become pregnant. If you drink alcohol: Limit how much you have to: 0-1 drink a day for women. 0-2 drinks a day for men. Know how much alcohol is in your drink. In the [place] one drink equals one 12 oz bottle of beer (355 mL), one 5 oz glass of wine (148 mL), or one 1 oz glass of hard liquor (44 mL). Lifestyle  Work with your health care provider to maintain a healthy body weight or to lose weight. Ask what an ideal weight is for you. Get at least 30 minutes of exercise that causes your heart to beat faster (aerobic exercise) most days of the week. Activities may include walking, swimming, or biking. Include exercise to strengthen your muscles (resistance exercise), such as Pilates or lifting weights, as part of your weekly exercise routine. Try to do these types of exercises for 30 minutes at least 3 days a week. Do not use any products that contain nicotine or tobacco. These products include cigarettes, chewing tobacco, and vaping devices, such as e-cigarettes.  If you need help quitting, ask your health care provider. Monitor your blood pressure at home as told by your health care provider. Keep all follow-up visits. This is important. Medicines Take over-the-counter and prescription medicines only as told by your health care provider. Follow directions carefully. Blood pressure medicines must be taken as prescribed. Do not skip doses of blood pressure medicine. Doing this puts you at risk for problems and can make the medicine less effective. Ask your health care provider about side effects or reactions to medicines that you should watch for. Contact a health care provider if you: Think you are having a reaction to a medicine you are taking. Have headaches that keep coming back (recurring). Feel dizzy. Have swelling in your ankles. Have trouble with your vision. Get help right away if you: Develop a severe headache or confusion. Have unusual weakness or numbness. Feel faint. Have severe pain in your chest or abdomen. Vomit repeatedly. Have trouble breathing. These symptoms may be an emergency. Get help right away. Call 911. Do not wait to see if the symptoms will go away. Do not drive yourself to the hospital. Summary Hypertension is when the force of blood pumping through your arteries is too strong.  If this condition is not controlled, it may put you at risk for serious complications. Your personal target blood pressure may vary depending on your medical conditions, your age, and other factors. For most people, a normal blood pressure is less than 120/80. Hypertension is treated with lifestyle changes, medicines, or a combination of both. Lifestyle changes include losing weight, eating a healthy, low-sodium diet, exercising more, and limiting alcohol. This information is not intended to replace advice given to you by your health care provider. Make sure you discuss any questions you have with your health care provider. Document Revised:  10/16/2021 Document Reviewed: 10/16/2021 Elsevier Patient Education  2024 Elsevier Inc.      Edwina Barth, MD [facility]

## 2023-12-03 NOTE — Assessment & Plan Note (Signed)
Chronic stable condition. Advised to stay well-hydrated and avoid NSAIDs

## 2023-12-03 NOTE — Patient Instructions (Signed)
Hypertension, Adult High blood pressure (hypertension) is when the force of blood pumping through the arteries is too strong. The arteries are the blood vessels that carry blood from the heart throughout the body. Hypertension forces the heart to work harder to pump blood and may cause arteries to become narrow or stiff. Untreated or uncontrolled hypertension can lead to a heart attack, heart failure, a stroke, kidney disease, and other problems. A blood pressure reading consists of a higher number over a lower number. Ideally, your blood pressure should be below 120/80. The first ("top") number is called the systolic pressure. It is a measure of the pressure in your arteries as your heart beats. The second ("bottom") number is called the diastolic pressure. It is a measure of the pressure in your arteries as the heart relaxes. What are the causes? The exact cause of this condition is not known. There are some conditions that result in high blood pressure. What increases the risk? Certain factors may make you more likely to develop high blood pressure. Some of these risk factors are under your control, including: Smoking. Not getting enough exercise or physical activity. Being overweight. Having too much fat, sugar, calories, or salt (sodium) in your diet. Drinking too much alcohol. Other risk factors include: Having a personal history of heart disease, diabetes, high cholesterol, or kidney disease. Stress. Having a family history of high blood pressure and high cholesterol. Having obstructive sleep apnea. Age. The risk increases with age. What are the signs or symptoms? High blood pressure may not cause symptoms. Very high blood pressure (hypertensive crisis) may cause: Headache. Fast or irregular heartbeats (palpitations). Shortness of breath. Nosebleed. Nausea and vomiting. Vision changes. Severe chest pain, dizziness, and seizures. How is this diagnosed? This condition is diagnosed by  measuring your blood pressure while you are seated, with your arm resting on a flat surface, your legs uncrossed, and your feet flat on the floor. The cuff of the blood pressure monitor will be placed directly against the skin of your upper arm at the level of your heart. Blood pressure should be measured at least twice using the same arm. Certain conditions can cause a difference in blood pressure between your right and left arms. If you have a high blood pressure reading during one visit or you have normal blood pressure with other risk factors, you may be asked to: Return on a different day to have your blood pressure checked again. Monitor your blood pressure at home for 1 week or longer. If you are diagnosed with hypertension, you may have other blood or imaging tests to help your health care provider understand your overall risk for other conditions. How is this treated? This condition is treated by making healthy lifestyle changes, such as eating healthy foods, exercising more, and reducing your alcohol intake. You may be referred for counseling on a healthy diet and physical activity. Your health care provider may prescribe medicine if lifestyle changes are not enough to get your blood pressure under control and if: Your systolic blood pressure is above 130. Your diastolic blood pressure is above 80. Your personal target blood pressure may vary depending on your medical conditions, your age, and other factors. Follow these instructions at home: Eating and drinking  Eat a diet that is high in fiber and potassium, and low in sodium, added sugar, and fat. An example of this eating plan is called the DASH diet. DASH stands for Dietary Approaches to Stop Hypertension. To eat this way: Eat   plenty of fresh fruits and vegetables. Try to fill one half of your plate at each meal with fruits and vegetables. Eat whole grains, such as whole-wheat pasta, brown rice, or whole-grain bread. Fill about one  fourth of your plate with whole grains. Eat or drink low-fat dairy products, such as skim milk or low-fat yogurt. Avoid fatty cuts of meat, processed or cured meats, and poultry with skin. Fill about one fourth of your plate with lean proteins, such as fish, chicken without skin, beans, eggs, or tofu. Avoid pre-made and processed foods. These tend to be higher in sodium, added sugar, and fat. Reduce your daily sodium intake. Many people with hypertension should eat less than 1,500 mg of sodium a day. Do not drink alcohol if: Your health care provider tells you not to drink. You are pregnant, may be pregnant, or are planning to become pregnant. If you drink alcohol: Limit how much you have to: 0-1 drink a day for women. 0-2 drinks a day for men. Know how much alcohol is in your drink. In the [place] one drink equals one 12 oz bottle of beer (355 mL), one 5 oz glass of wine (148 mL), or one 1 oz glass of hard liquor (44 mL). Lifestyle  Work with your health care provider to maintain a healthy body weight or to lose weight. Ask what an ideal weight is for you. Get at least 30 minutes of exercise that causes your heart to beat faster (aerobic exercise) most days of the week. Activities may include walking, swimming, or biking. Include exercise to strengthen your muscles (resistance exercise), such as Pilates or lifting weights, as part of your weekly exercise routine. Try to do these types of exercises for 30 minutes at least 3 days a week. Do not use any products that contain nicotine or tobacco. These products include cigarettes, chewing tobacco, and vaping devices, such as e-cigarettes. If you need help quitting, ask your health care provider. Monitor your blood pressure at home as told by your health care provider. Keep all follow-up visits. This is important. Medicines Take over-the-counter and prescription medicines only as told by your health care provider. Follow directions carefully. Blood  pressure medicines must be taken as prescribed. Do not skip doses of blood pressure medicine. Doing this puts you at risk for problems and can make the medicine less effective. Ask your health care provider about side effects or reactions to medicines that you should watch for. Contact a health care provider if you: Think you are having a reaction to a medicine you are taking. Have headaches that keep coming back (recurring). Feel dizzy. Have swelling in your ankles. Have trouble with your vision. Get help right away if you: Develop a severe headache or confusion. Have unusual weakness or numbness. Feel faint. Have severe pain in your chest or abdomen. Vomit repeatedly. Have trouble breathing. These symptoms may be an emergency. Get help right away. Call 911. Do not wait to see if the symptoms will go away. Do not drive yourself to the hospital. Summary Hypertension is when the force of blood pumping through your arteries is too strong. If this condition is not controlled, it may put you at risk for serious complications. Your personal target blood pressure may vary depending on your medical conditions, your age, and other factors. For most people, a normal blood pressure is less than 120/80. Hypertension is treated with lifestyle changes, medicines, or a combination of both. Lifestyle changes include losing weight, eating a healthy,   low-sodium diet, exercising more, and limiting alcohol. This information is not intended to replace advice given to you by your health care provider. Make sure you discuss any questions you have with your health care provider. Document Revised: 10/16/2021 Document Reviewed: 10/16/2021 Elsevier Patient Education  2024 [employer]  

## 2023-12-03 NOTE — Assessment & Plan Note (Signed)
Stable and well-controlled on Symbicort 2 puffs twice a day and albuterol as rescue inhaler Was able to follow-up recently with pulmonary doctor Office visit assessment and plan reviewed

## 2023-12-03 NOTE — Assessment & Plan Note (Signed)
Chronic stable condition Presently on no medication

## 2024-02-27 ENCOUNTER — Ambulatory Visit: Payer: Self-pay | Admitting: Emergency Medicine

## 2024-02-27 NOTE — Telephone Encounter (Signed)
 Copied from CRM [id number]. Topic: Clinical - Red Word Triage >> Feb 27, 2024  2:32 PM Melissa C wrote: Red Word that prompted transfer to Nurse Triage: patient initially wanted to schedule appointment to schedule blood level then stated that he was feeling dizzy and not well, felt like a heat wave was coming over him   Chief Complaint: Low BP Symptoms: None at this time Pertinent Negatives: Patient denies dizziness, headache, blurred vision, weakness Disposition: [] ED /[] Urgent Care (no appt availability in office) / [x] Appointment(In office/virtual)/ []  [facility]/ [] Home Care/ [] Refused Recommended Disposition /[] [facility]/ []  Follow-up with PCP Additional Notes: Patient stated he was at the chiropractor earlier today and they told him his blood pressure was low. Patient denies being on any blood pressure medicines. He does not remember what the BP reading was.   This RN advised patient to check BP while on the phone. Patient checked it, and he reported a reading of 100/40. He checked it a second time a few minutes later and reported a different reading of 100/80. Patient does not sound certain of the accuracy of the reading. He was using a manual BP cuff. Patient stated he had a brief episode of dizziness this morning and it went away. Patient feels fine at this time and has no symptoms currently. Next available appt was offered for 3/10. Patient declined and requested to schedule for 3/11 instead. Appointment scheduled. Patient was advised to notify the office of any new concerns or if dizziness persists.   Reason for Disposition  Brief (now gone) dizziness or lightheadedness after standing up or eating  Answer Assessment - Initial Assessment Questions 1. BLOOD PRESSURE: "What is the blood pressure?" "Did you take at least two measurements 5 minutes apart?"     100/40 at the time of call but patient is not certain. Patient rechecked and it was 100/80 the 2nd time   2.  HOW: "How did you obtain the blood pressure?" (e.g., visiting nurse, automatic home BP monitor)     Manual BP cuff at home  3. MEDICINES: "Are you taking any medications for blood pressure?" If Yes, ask: "Have they been changed recently?"     No  4. OTHER SYMPTOMS: "Have you been sick recently?" "Have you had a recent injury?"     Fell dizzy this morning. Also felt like temp was fluctuating between hot and cold earlier today. Unsure of temp. These symptoms have resolved, and patient feels fine now.  Protocols used: Blood Pressure - Low-A-AH

## 2024-03-02 ENCOUNTER — Encounter: Payer: Self-pay | Admitting: Emergency Medicine

## 2024-03-02 ENCOUNTER — Other Ambulatory Visit: Payer: Self-pay | Admitting: Emergency Medicine

## 2024-03-02 ENCOUNTER — Ambulatory Visit (INDEPENDENT_AMBULATORY_CARE_PROVIDER_SITE_OTHER): Admitting: Emergency Medicine

## 2024-03-02 VITALS — BP 160/92 | HR 63 | Temp 97.4°F | Ht 66.0 in | Wt 178.0 lb

## 2024-03-02 DIAGNOSIS — E785 Hyperlipidemia, unspecified: Secondary | ICD-10-CM | POA: Diagnosis not present

## 2024-03-02 DIAGNOSIS — R7303 Prediabetes: Secondary | ICD-10-CM

## 2024-03-02 DIAGNOSIS — I10 Essential (primary) hypertension: Secondary | ICD-10-CM

## 2024-03-02 DIAGNOSIS — N1831 Chronic kidney disease, stage 3a: Secondary | ICD-10-CM | POA: Diagnosis not present

## 2024-03-02 LAB — CBC WITH DIFFERENTIAL/PLATELET
Basophils Absolute: 0.1 10*3/uL (ref 0.0–0.1)
Basophils Relative: 1.1 % (ref 0.0–3.0)
Eosinophils Absolute: 0.5 10*3/uL (ref 0.0–0.7)
Eosinophils Relative: 7.5 % — ABNORMAL HIGH (ref 0.0–5.0)
HCT: 46.2 % (ref 39.0–52.0)
Hemoglobin: 15.1 g/dL (ref 13.0–17.0)
Lymphocytes Relative: 43.2 % (ref 12.0–46.0)
Lymphs Abs: 2.7 10*3/uL (ref 0.7–4.0)
MCHC: 32.7 g/dL (ref 30.0–36.0)
MCV: 91.3 fl (ref 78.0–100.0)
Monocytes Absolute: 0.4 10*3/uL (ref 0.1–1.0)
Monocytes Relative: 6.6 % (ref 3.0–12.0)
Neutro Abs: 2.6 10*3/uL (ref 1.4–7.7)
Neutrophils Relative %: 41.6 % — ABNORMAL LOW (ref 43.0–77.0)
Platelets: 297 10*3/uL (ref 150.0–400.0)
RBC: 5.06 Mil/uL (ref 4.22–5.81)
RDW: 13.7 % (ref 11.5–15.5)
WBC: 6.3 10*3/uL (ref 4.0–10.5)

## 2024-03-02 LAB — TSH: TSH: 1.56 u[IU]/mL (ref 0.35–5.50)

## 2024-03-02 LAB — COMPREHENSIVE METABOLIC PANEL
ALT: 17 U/L (ref 0–53)
AST: 28 U/L (ref 0–37)
Albumin: 4.3 g/dL (ref 3.5–5.2)
Alkaline Phosphatase: 67 U/L (ref 39–117)
BUN: 10 mg/dL (ref 6–23)
CO2: 24 meq/L (ref 19–32)
Calcium: 9.6 mg/dL (ref 8.4–10.5)
Chloride: 106 meq/L (ref 96–112)
Creatinine, Ser: 1.21 mg/dL (ref 0.40–1.50)
GFR: 55.25 mL/min — ABNORMAL LOW (ref >60.00)
Glucose, Bld: 87 mg/dL (ref 70–99)
Potassium: 4.5 meq/L (ref 3.5–5.1)
Sodium: 140 meq/L (ref 135–145)
Total Bilirubin: 0.7 mg/dL (ref 0.2–1.2)
Total Protein: 7.3 g/dL (ref 6.0–8.3)

## 2024-03-02 LAB — LIPID PANEL
Cholesterol: 195 mg/dL (ref 0–200)
HDL: 47.5 mg/dL (ref >39.00)
LDL Cholesterol: 114 mg/dL — ABNORMAL HIGH (ref 0–99)
NonHDL: 147.35
Total CHOL/HDL Ratio: 4
Triglycerides: 168 mg/dL — ABNORMAL HIGH (ref 0.0–149.0)
VLDL: 33.6 mg/dL (ref 0.0–40.0)

## 2024-03-02 LAB — HEMOGLOBIN A1C: Hgb A1c MFr Bld: 5 % (ref 4.6–6.5)

## 2024-03-02 MED ORDER — EMPAGLIFLOZIN 10 MG PO TABS
10.0000 mg | ORAL_TABLET | Freq: Every day | ORAL | 3 refills | Status: AC
Start: 2024-03-02 — End: ?

## 2024-03-02 NOTE — Assessment & Plan Note (Signed)
Chronic stable condition Presently on no medication

## 2024-03-02 NOTE — Patient Instructions (Signed)
 Hypertension, Adult High blood pressure (hypertension) is when the force of blood pumping through the arteries is too strong. The arteries are the blood vessels that carry blood from the heart throughout the body. Hypertension forces the heart to work harder to pump blood and may cause arteries to become narrow or stiff. Untreated or uncontrolled hypertension can lead to a heart attack, heart failure, a stroke, kidney disease, and other problems. A blood pressure reading consists of a higher number over a lower number. Ideally, your blood pressure should be below 120/80. The first ("top") number is called the systolic pressure. It is a measure of the pressure in your arteries as your heart beats. The second ("bottom") number is called the diastolic pressure. It is a measure of the pressure in your arteries as the heart relaxes. What are the causes? The exact cause of this condition is not known. There are some conditions that result in high blood pressure. What increases the risk? Certain factors may make you more likely to develop high blood pressure. Some of these risk factors are under your control, including: Smoking. Not getting enough exercise or physical activity. Being overweight. Having too much fat, sugar, calories, or salt (sodium) in your diet. Drinking too much alcohol. Other risk factors include: Having a personal history of heart disease, diabetes, high cholesterol, or kidney disease. Stress. Having a family history of high blood pressure and high cholesterol. Having obstructive sleep apnea. Age. The risk increases with age. What are the signs or symptoms? High blood pressure may not cause symptoms. Very high blood pressure (hypertensive crisis) may cause: Headache. Fast or irregular heartbeats (palpitations). Shortness of breath. Nosebleed. Nausea and vomiting. Vision changes. Severe chest pain, dizziness, and seizures. How is this diagnosed? This condition is diagnosed by  measuring your blood pressure while you are seated, with your arm resting on a flat surface, your legs uncrossed, and your feet flat on the floor. The cuff of the blood pressure monitor will be placed directly against the skin of your upper arm at the level of your heart. Blood pressure should be measured at least twice using the same arm. Certain conditions can cause a difference in blood pressure between your right and left arms. If you have a high blood pressure reading during one visit or you have normal blood pressure with other risk factors, you may be asked to: Return on a different day to have your blood pressure checked again. Monitor your blood pressure at home for 1 week or longer. If you are diagnosed with hypertension, you may have other blood or imaging tests to help your health care provider understand your overall risk for other conditions. How is this treated? This condition is treated by making healthy lifestyle changes, such as eating healthy foods, exercising more, and reducing your alcohol intake. You may be referred for counseling on a healthy diet and physical activity. Your health care provider may prescribe medicine if lifestyle changes are not enough to get your blood pressure under control and if: Your systolic blood pressure is above 130. Your diastolic blood pressure is above 80. Your personal target blood pressure may vary depending on your medical conditions, your age, and other factors. Follow these instructions at home: Eating and drinking  Eat a diet that is high in fiber and potassium, and low in sodium, added sugar, and fat. An example of this eating plan is called the DASH diet. DASH stands for Dietary Approaches to Stop Hypertension. To eat this way: Eat  plenty of fresh fruits and vegetables. Try to fill one half of your plate at each meal with fruits and vegetables. Eat whole grains, such as whole-wheat pasta, brown rice, or whole-grain bread. Fill about one  fourth of your plate with whole grains. Eat or drink low-fat dairy products, such as skim milk or low-fat yogurt. Avoid fatty cuts of meat, processed or cured meats, and poultry with skin. Fill about one fourth of your plate with lean proteins, such as fish, chicken without skin, beans, eggs, or tofu. Avoid pre-made and processed foods. These tend to be higher in sodium, added sugar, and fat. Reduce your daily sodium intake. Many people with hypertension should eat less than 1,500 mg of sodium a day. Do not drink alcohol if: Your health care provider tells you not to drink. You are pregnant, may be pregnant, or are planning to become pregnant. If you drink alcohol: Limit how much you have to: 0-1 drink a day for women. 0-2 drinks a day for men. Know how much alcohol is in your drink. In the [place] one drink equals one 12 oz bottle of beer (355 mL), one 5 oz glass of wine (148 mL), or one 1 oz glass of hard liquor (44 mL). Lifestyle  Work with your health care provider to maintain a healthy body weight or to lose weight. Ask what an ideal weight is for you. Get at least 30 minutes of exercise that causes your heart to beat faster (aerobic exercise) most days of the week. Activities may include walking, swimming, or biking. Include exercise to strengthen your muscles (resistance exercise), such as Pilates or lifting weights, as part of your weekly exercise routine. Try to do these types of exercises for 30 minutes at least 3 days a week. Do not use any products that contain nicotine or tobacco. These products include cigarettes, chewing tobacco, and vaping devices, such as e-cigarettes. If you need help quitting, ask your health care provider. Monitor your blood pressure at home as told by your health care provider. Keep all follow-up visits. This is important. Medicines Take over-the-counter and prescription medicines only as told by your health care provider. Follow directions carefully. Blood  pressure medicines must be taken as prescribed. Do not skip doses of blood pressure medicine. Doing this puts you at risk for problems and can make the medicine less effective. Ask your health care provider about side effects or reactions to medicines that you should watch for. Contact a health care provider if you: Think you are having a reaction to a medicine you are taking. Have headaches that keep coming back (recurring). Feel dizzy. Have swelling in your ankles. Have trouble with your vision. Get help right away if you: Develop a severe headache or confusion. Have unusual weakness or numbness. Feel faint. Have severe pain in your chest or abdomen. Vomit repeatedly. Have trouble breathing. These symptoms may be an emergency. Get help right away. Call 911. Do not wait to see if the symptoms will go away. Do not drive yourself to the hospital. Summary Hypertension is when the force of blood pumping through your arteries is too strong. If this condition is not controlled, it may put you at risk for serious complications. Your personal target blood pressure may vary depending on your medical conditions, your age, and other factors. For most people, a normal blood pressure is less than 120/80. Hypertension is treated with lifestyle changes, medicines, or a combination of both. Lifestyle changes include losing weight, eating a healthy,  low-sodium diet, exercising more, and limiting alcohol. This information is not intended to replace advice given to you by your health care provider. Make sure you discuss any questions you have with your health care provider. Document Revised: 10/16/2021 Document Reviewed: 10/16/2021 Elsevier Patient Education  2024 [employer].

## 2024-03-02 NOTE — Progress Notes (Signed)
 [name] 83 y.o.   Chief Complaint  Patient presents with   Dizziness    Patient states his BP with Dr. Allison Quarry last week was low. He states he gets these hot flashes not really dizziness, that comes and goes.     HISTORY OF PRESENT ILLNESS: This is a 83 y.o. male A1A with history of hypertension here for follow-up Found to have low blood pressure at chiropractor's office last week No changes in medications Occasional hot flashes that come and go No other complaints or medical concerns today.  Dizziness Pertinent negatives include no abdominal pain, chest pain, chills, congestion, coughing, fever, nausea, rash, sore throat or vomiting.     Prior to Admission medications   Medication Sig Start Date End Date Taking? Authorizing Provider  albuterol (PROVENTIL) (2.5 MG/3ML) 0.083% nebulizer solution USE 1 VIAL IN NEBULIZER EVERY 4 HOURS AS NEEDED 09/02/22  Yes Joyia Riehle, Eilleen Kempf, MD  amLODipine (NORVASC) 5 MG tablet Take 1 tablet (5 mg total) by mouth daily. 05/08/22  Yes Overton Boggus, Eilleen Kempf, MD  Ascorbic Acid (VITAMIN C) 1000 MG tablet Take 1,000 mg by mouth daily.   Yes [provider]  aspirin 81 MG EC tablet Take 81 mg by mouth every morning.   Yes [provider]  budesonide-formoterol (SYMBICORT) 160-4.5 MCG/ACT inhaler TAKE 2 PUFFS FIRST THING IN AM AND THEN ANOTHER 2 PUFFS ABOUT 12 HOURS LATER. 02/20/23  Yes Nyoka Cowden, MD  CHELATED MAGNESIUM PO Take 1 tablet by mouth daily.   Yes [provider]  cholecalciferol (VITAMIN D3) 25 MCG (1000 UT) tablet Take 1,000 Units by mouth daily.   Yes [provider]  cyanocobalamin 1000 MCG tablet Take 1,000 mcg by mouth daily.   Yes [provider]  famotidine (PEPCID) 20 MG tablet Take 20 mg by mouth daily.   Yes [provider]  Ibuprofen 200 MG CAPS Take 200 mg by mouth every 6 (six) hours as needed (headache/pain).    Yes [provider]  meclizine (ANTIVERT) 12.5  MG tablet Take 1 tablet by mouth 4 times a day as needed for dizziness 06/19/23  Yes Jakub Debold, Eilleen Kempf, MD  oxymetazoline (AFRIN) 0.05 % nasal spray Place 1 spray into both nostrils 2 (two) times daily as needed for congestion.   Yes [provider]  Polyvinyl Alcohol-Povidone (REFRESH OP) Apply 1 drop to eye as needed (dry eyes).    Yes [provider]  Respiratory Therapy Supplies (FLUTTER) DEVI Use every 4 hours as needed for cough and congestion   Yes [provider]    No Known Allergies  Patient Active Problem List   Diagnosis Date Noted   Neurogenic claudication 07/23/2023   Paresthesia of both feet 01/15/2023   Prediabetes 01/15/2023   Stage 3a chronic kidney disease (HCC) 06/22/2017   Hx of adenomatous colonic polyps 06/25/2015   Essential hypertension 09/05/2011   Dyslipidemia 08/08/2011   Esophageal dysphagia 03/12/2011   GERD 11/06/2009   Seasonal and perennial allergic rhinitis 05/31/2009   Severe persistent asthma 01/04/2008    Past Medical History:  Diagnosis Date   Acid reflux    Asthma 06/18/2011   Borderline hypertension    Cataract    Colon polyps 2011   1 Hyperplastic and 1 Tubular Adenoma    Dysphagia    Esophageal stricture    GERD (gastroesophageal reflux disease)    Hx of adenomatous colonic polyps 06/25/2015   Personal history of colonic polyps 12/1989   Shingles  Shoulder pain, right    Unspecified asthma(493.90)    Urinary frequency     Past Surgical History:  Procedure Laterality Date   BALLOON DILATION N/A 02/07/2015   Procedure: BALLOON DILATION;  Surgeon: Iva Boop, MD;  Location: [facility];  Service: Endoscopy;  Laterality: N/A;   ESOPHAGOGASTRODUODENOSCOPY (EGD) WITH PROPOFOL N/A 02/07/2015   Procedure: ESOPHAGOGASTRODUODENOSCOPY (EGD) WITH PROPOFOL;  Surgeon: Iva Boop, MD;  Location: [facility];  Service: Endoscopy;  Laterality: N/A;   HEMORRHOID SURGERY     HERNIA REPAIR     MASS EXCISION  N/A 08/14/2016   Procedure: EXCISION SEBACEOUS CYST BACK;  Surgeon: Luretha Murphy, MD;  Location: [facility];  Service: General;  Laterality: N/A;  EXCISION SEBACEOUS CYST BACK   TONSILLECTOMY      Social History   Socioeconomic History   Marital status: Significant Other    Spouse name: Not on file   Number of children: 7   Years of education: Not on file   Highest education level: Not on file  Occupational History   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    Associate Professor: RETIRED  Tobacco Use   Smoking status: Never    Passive exposure: Never   Smokeless tobacco: Never  Vaping Use   Vaping status: Never Used  Substance and Sexual Activity   Alcohol use: No   Drug use: No   Sexual activity: Not on file  Other Topics Concern   Not on file  Social History Narrative   Patient is separated from spouse   Lives alone   Social Drivers of Health   Financial Resource Strain: Low Risk  (12/02/2023)   Overall Financial Resource Strain (CARDIA)    Difficulty of Paying Living Expenses: Not hard at all  Food Insecurity: No Food Insecurity (12/02/2023)   Hunger Vital Sign    Worried About Running Out of Food in the Last Year: Never true    Ran Out of Food in the Last Year: Never true  Transportation Needs: No Transportation Needs (12/02/2023)   PRAPARE - Administrator, Civil Service (Medical): No    Lack of Transportation (Non-Medical): No  Physical Activity: Sufficiently Active (12/02/2023)   Exercise Vital Sign    Days of Exercise per Week: 7 days    Minutes of Exercise per Session: 30 min  Stress: No Stress Concern Present (12/02/2023)   [employer] of Occupational Health - Occupational Stress Questionnaire    Feeling of Stress : Not at all  Social Connections: Moderately Integrated (12/02/2023)   Social Connection and Isolation Panel [NHANES]    Frequency of Communication with Friends and Family: More than three times a week    Frequency  of Social Gatherings with Friends and Family: More than three times a week    Attends Religious Services: More than 4 times per year    Active Member of [employer] or Organizations: Yes    Attends Banker Meetings: Never    Marital Status: Divorced  Catering manager Violence: Patient Unable To Answer (12/02/2023)   Humiliation, Afraid, Rape, and Kick questionnaire    Fear of Current or Ex-Partner: Patient unable to answer    Emotionally Abused: Patient unable to answer    Physically Abused: Patient unable to answer    Sexually Abused: Patient unable to answer    Family History  Problem Relation Age of Onset   Heart disease Mother    Kidney failure Sister    Breast  cancer Sister    Lung cancer Brother    Brain cancer Sister    Asthma Sister    Colon cancer Neg Hx      Review of Systems  Constitutional: Negative.  Negative for chills and fever.  HENT: Negative.  Negative for congestion and sore throat.   Respiratory: Negative.  Negative for cough and shortness of breath.   Cardiovascular: Negative.  Negative for chest pain and palpitations.  Gastrointestinal:  Negative for abdominal pain, nausea and vomiting.  Genitourinary: Negative.  Negative for dysuria and hematuria.  Musculoskeletal:  Positive for back pain.  Skin: Negative.  Negative for rash.  Neurological:  Positive for dizziness.  All other systems reviewed and are negative.   Vitals:   03/02/24 1010  BP: (!) 160/92  Pulse: 63  Temp: (!) 97.4 F (36.3 C)  SpO2: 95%    Physical Exam Vitals reviewed.  Constitutional:      Appearance: Normal appearance.  HENT:     Head: Normocephalic.     Mouth/Throat:     Mouth: Mucous membranes are moist.     Pharynx: Oropharynx is clear.  Eyes:     Extraocular Movements: Extraocular movements intact.     Pupils: Pupils are equal, round, and reactive to light.  Cardiovascular:     Rate and Rhythm: Normal rate and regular rhythm.     Pulses: Normal pulses.      Heart sounds: Normal heart sounds.  Pulmonary:     Effort: Pulmonary effort is normal.     Breath sounds: Normal breath sounds.  Musculoskeletal:     Cervical back: No tenderness.  Lymphadenopathy:     Cervical: No cervical adenopathy.  Skin:    General: Skin is warm and dry.     Capillary Refill: Capillary refill takes less than 2 seconds.  Neurological:     General: No focal deficit present.     Mental Status: He is alert and oriented to person, place, and time.  Psychiatric:        Mood and Affect: Mood normal.        Behavior: Behavior normal.     ASSESSMENT & PLAN: A total of 46 minutes was spent with the patient and counseling/coordination of care regarding preparing for this visit, review of most recent office visit notes, review of multiple chronic medical conditions and their management, cardiovascular risks associated with hypertension, review of all medications, review of most recent bloodwork results, review of health maintenance items, education on nutrition, prognosis, documentation, and need for follow up.   Problem List Items Addressed This Visit       Cardiovascular and Mediastinum   Essential hypertension - Primary   Elevated blood pressure reading in the office today but normal at home Continue amlodipine 5 mg daily Cardiovascular risks associated with hypertension discussed Diet and nutrition discussed      Relevant Orders   Comprehensive metabolic panel   CBC with Differential/Platelet   Hemoglobin A1c   Lipid panel   TSH     Genitourinary   Stage 3a chronic kidney disease (HCC)   Chronic stable condition Advised to stay well-hydrated and avoid NSAIDs      Relevant Orders   Comprehensive metabolic panel   CBC with Differential/Platelet   Hemoglobin A1c   Lipid panel   TSH     Other   Dyslipidemia   Chronic stable condition Presently on no medication      Relevant Orders   Comprehensive metabolic panel  CBC with  Differential/Platelet   Hemoglobin A1c   Lipid panel   TSH   Prediabetes   Stable chronic condition Diet and nutrition discussed      Relevant Orders   Comprehensive metabolic panel   CBC with Differential/Platelet   Hemoglobin A1c   Lipid panel   TSH   Patient Instructions  Hypertension, Adult High blood pressure (hypertension) is when the force of blood pumping through the arteries is too strong. The arteries are the blood vessels that carry blood from the heart throughout the body. Hypertension forces the heart to work harder to pump blood and may cause arteries to become narrow or stiff. Untreated or uncontrolled hypertension can lead to a heart attack, heart failure, a stroke, kidney disease, and other problems. A blood pressure reading consists of a higher number over a lower number. Ideally, your blood pressure should be below 120/80. The first ("top") number is called the systolic pressure. It is a measure of the pressure in your arteries as your heart beats. The second ("bottom") number is called the diastolic pressure. It is a measure of the pressure in your arteries as the heart relaxes. What are the causes? The exact cause of this condition is not known. There are some conditions that result in high blood pressure. What increases the risk? Certain factors may make you more likely to develop high blood pressure. Some of these risk factors are under your control, including: Smoking. Not getting enough exercise or physical activity. Being overweight. Having too much fat, sugar, calories, or salt (sodium) in your diet. Drinking too much alcohol. Other risk factors include: Having a personal history of heart disease, diabetes, high cholesterol, or kidney disease. Stress. Having a family history of high blood pressure and high cholesterol. Having obstructive sleep apnea. Age. The risk increases with age. What are the signs or symptoms? High blood pressure may not cause  symptoms. Very high blood pressure (hypertensive crisis) may cause: Headache. Fast or irregular heartbeats (palpitations). Shortness of breath. Nosebleed. Nausea and vomiting. Vision changes. Severe chest pain, dizziness, and seizures. How is this diagnosed? This condition is diagnosed by measuring your blood pressure while you are seated, with your arm resting on a flat surface, your legs uncrossed, and your feet flat on the floor. The cuff of the blood pressure monitor will be placed directly against the skin of your upper arm at the level of your heart. Blood pressure should be measured at least twice using the same arm. Certain conditions can cause a difference in blood pressure between your right and left arms. If you have a high blood pressure reading during one visit or you have normal blood pressure with other risk factors, you may be asked to: Return on a different day to have your blood pressure checked again. Monitor your blood pressure at home for 1 week or longer. If you are diagnosed with hypertension, you may have other blood or imaging tests to help your health care provider understand your overall risk for other conditions. How is this treated? This condition is treated by making healthy lifestyle changes, such as eating healthy foods, exercising more, and reducing your alcohol intake. You may be referred for counseling on a healthy diet and physical activity. Your health care provider may prescribe medicine if lifestyle changes are not enough to get your blood pressure under control and if: Your systolic blood pressure is above 130. Your diastolic blood pressure is above 80. Your personal target blood pressure may vary depending  on your medical conditions, your age, and other factors. Follow these instructions at home: Eating and drinking  Eat a diet that is high in fiber and potassium, and low in sodium, added sugar, and fat. An example of this eating plan is called the DASH  diet. DASH stands for Dietary Approaches to Stop Hypertension. To eat this way: Eat plenty of fresh fruits and vegetables. Try to fill one half of your plate at each meal with fruits and vegetables. Eat whole grains, such as whole-wheat pasta, brown rice, or whole-grain bread. Fill about one fourth of your plate with whole grains. Eat or drink low-fat dairy products, such as skim milk or low-fat yogurt. Avoid fatty cuts of meat, processed or cured meats, and poultry with skin. Fill about one fourth of your plate with lean proteins, such as fish, chicken without skin, beans, eggs, or tofu. Avoid pre-made and processed foods. These tend to be higher in sodium, added sugar, and fat. Reduce your daily sodium intake. Many people with hypertension should eat less than 1,500 mg of sodium a day. Do not drink alcohol if: Your health care provider tells you not to drink. You are pregnant, may be pregnant, or are planning to become pregnant. If you drink alcohol: Limit how much you have to: 0-1 drink a day for women. 0-2 drinks a day for men. Know how much alcohol is in your drink. In the [place] one drink equals one 12 oz bottle of beer (355 mL), one 5 oz glass of wine (148 mL), or one 1 oz glass of hard liquor (44 mL). Lifestyle  Work with your health care provider to maintain a healthy body weight or to lose weight. Ask what an ideal weight is for you. Get at least 30 minutes of exercise that causes your heart to beat faster (aerobic exercise) most days of the week. Activities may include walking, swimming, or biking. Include exercise to strengthen your muscles (resistance exercise), such as Pilates or lifting weights, as part of your weekly exercise routine. Try to do these types of exercises for 30 minutes at least 3 days a week. Do not use any products that contain nicotine or tobacco. These products include cigarettes, chewing tobacco, and vaping devices, such as e-cigarettes. If you need help  quitting, ask your health care provider. Monitor your blood pressure at home as told by your health care provider. Keep all follow-up visits. This is important. Medicines Take over-the-counter and prescription medicines only as told by your health care provider. Follow directions carefully. Blood pressure medicines must be taken as prescribed. Do not skip doses of blood pressure medicine. Doing this puts you at risk for problems and can make the medicine less effective. Ask your health care provider about side effects or reactions to medicines that you should watch for. Contact a health care provider if you: Think you are having a reaction to a medicine you are taking. Have headaches that keep coming back (recurring). Feel dizzy. Have swelling in your ankles. Have trouble with your vision. Get help right away if you: Develop a severe headache or confusion. Have unusual weakness or numbness. Feel faint. Have severe pain in your chest or abdomen. Vomit repeatedly. Have trouble breathing. These symptoms may be an emergency. Get help right away. Call 911. Do not wait to see if the symptoms will go away. Do not drive yourself to the hospital. Summary Hypertension is when the force of blood pumping through your arteries is too strong. If this condition  is not controlled, it may put you at risk for serious complications. Your personal target blood pressure may vary depending on your medical conditions, your age, and other factors. For most people, a normal blood pressure is less than 120/80. Hypertension is treated with lifestyle changes, medicines, or a combination of both. Lifestyle changes include losing weight, eating a healthy, low-sodium diet, exercising more, and limiting alcohol. This information is not intended to replace advice given to you by your health care provider. Make sure you discuss any questions you have with your health care provider. Document Revised: 10/16/2021 Document  Reviewed: 10/16/2021 Elsevier Patient Education  2024 Elsevier Inc.    Edwina Barth, MD [facility]

## 2024-03-02 NOTE — Assessment & Plan Note (Signed)
Stable chronic condition. Diet and nutrition discussed.

## 2024-03-02 NOTE — Assessment & Plan Note (Signed)
 Elevated blood pressure reading in the office today but normal at home Continue amlodipine 5 mg daily Cardiovascular risks associated with hypertension discussed Diet and nutrition discussed

## 2024-03-02 NOTE — Assessment & Plan Note (Signed)
 Chronic stable condition. Advised to stay well-hydrated and avoid NSAIDs

## 2024-06-02 ENCOUNTER — Ambulatory Visit (INDEPENDENT_AMBULATORY_CARE_PROVIDER_SITE_OTHER): Payer: Medicare Other | Admitting: Emergency Medicine

## 2024-06-02 ENCOUNTER — Encounter: Payer: Self-pay | Admitting: Emergency Medicine

## 2024-06-02 VITALS — BP 148/84 | HR 81 | Temp 97.5°F | Ht 66.0 in | Wt 183.0 lb

## 2024-06-02 DIAGNOSIS — E785 Hyperlipidemia, unspecified: Secondary | ICD-10-CM

## 2024-06-02 DIAGNOSIS — R7303 Prediabetes: Secondary | ICD-10-CM

## 2024-06-02 DIAGNOSIS — I10 Essential (primary) hypertension: Secondary | ICD-10-CM

## 2024-06-02 DIAGNOSIS — N1831 Chronic kidney disease, stage 3a: Secondary | ICD-10-CM

## 2024-06-02 NOTE — Patient Instructions (Signed)
 Hypertension, Adult High blood pressure (hypertension) is when the force of blood pumping through the arteries is too strong. The arteries are the blood vessels that carry blood from the heart throughout the body. Hypertension forces the heart to work harder to pump blood and may cause arteries to become narrow or stiff. Untreated or uncontrolled hypertension can lead to a heart attack, heart failure, a stroke, kidney disease, and other problems. A blood pressure reading consists of a higher number over a lower number. Ideally, your blood pressure should be below 120/80. The first ("top") number is called the systolic pressure. It is a measure of the pressure in your arteries as your heart beats. The second ("bottom") number is called the diastolic pressure. It is a measure of the pressure in your arteries as the heart relaxes. What are the causes? The exact cause of this condition is not known. There are some conditions that result in high blood pressure. What increases the risk? Certain factors may make you more likely to develop high blood pressure. Some of these risk factors are under your control, including: Smoking. Not getting enough exercise or physical activity. Being overweight. Having too much fat, sugar, calories, or salt (sodium) in your diet. Drinking too much alcohol. Other risk factors include: Having a personal history of heart disease, diabetes, high cholesterol, or kidney disease. Stress. Having a family history of high blood pressure and high cholesterol. Having obstructive sleep apnea. Age. The risk increases with age. What are the signs or symptoms? High blood pressure may not cause symptoms. Very high blood pressure (hypertensive crisis) may cause: Headache. Fast or irregular heartbeats (palpitations). Shortness of breath. Nosebleed. Nausea and vomiting. Vision changes. Severe chest pain, dizziness, and seizures. How is this diagnosed? This condition is diagnosed by  measuring your blood pressure while you are seated, with your arm resting on a flat surface, your legs uncrossed, and your feet flat on the floor. The cuff of the blood pressure monitor will be placed directly against the skin of your upper arm at the level of your heart. Blood pressure should be measured at least twice using the same arm. Certain conditions can cause a difference in blood pressure between your right and left arms. If you have a high blood pressure reading during one visit or you have normal blood pressure with other risk factors, you may be asked to: Return on a different day to have your blood pressure checked again. Monitor your blood pressure at home for 1 week or longer. If you are diagnosed with hypertension, you may have other blood or imaging tests to help your health care provider understand your overall risk for other conditions. How is this treated? This condition is treated by making healthy lifestyle changes, such as eating healthy foods, exercising more, and reducing your alcohol intake. You may be referred for counseling on a healthy diet and physical activity. Your health care provider may prescribe medicine if lifestyle changes are not enough to get your blood pressure under control and if: Your systolic blood pressure is above 130. Your diastolic blood pressure is above 80. Your personal target blood pressure may vary depending on your medical conditions, your age, and other factors. Follow these instructions at home: Eating and drinking  Eat a diet that is high in fiber and potassium, and low in sodium, added sugar, and fat. An example of this eating plan is called the DASH diet. DASH stands for Dietary Approaches to Stop Hypertension. To eat this way: Eat  plenty of fresh fruits and vegetables. Try to fill one half of your plate at each meal with fruits and vegetables. Eat whole grains, such as whole-wheat pasta, brown rice, or whole-grain bread. Fill about one  fourth of your plate with whole grains. Eat or drink low-fat dairy products, such as skim milk or low-fat yogurt. Avoid fatty cuts of meat, processed or cured meats, and poultry with skin. Fill about one fourth of your plate with lean proteins, such as fish, chicken without skin, beans, eggs, or tofu. Avoid pre-made and processed foods. These tend to be higher in sodium, added sugar, and fat. Reduce your daily sodium intake. Many people with hypertension should eat less than 1,500 mg of sodium a day. Do not drink alcohol if: Your health care provider tells you not to drink. You are pregnant, may be pregnant, or are planning to become pregnant. If you drink alcohol: Limit how much you have to: 0-1 drink a day for women. 0-2 drinks a day for men. Know how much alcohol is in your drink. In the [place] one drink equals one 12 oz bottle of beer (355 mL), one 5 oz glass of wine (148 mL), or one 1 oz glass of hard liquor (44 mL). Lifestyle  Work with your health care provider to maintain a healthy body weight or to lose weight. Ask what an ideal weight is for you. Get at least 30 minutes of exercise that causes your heart to beat faster (aerobic exercise) most days of the week. Activities may include walking, swimming, or biking. Include exercise to strengthen your muscles (resistance exercise), such as Pilates or lifting weights, as part of your weekly exercise routine. Try to do these types of exercises for 30 minutes at least 3 days a week. Do not use any products that contain nicotine or tobacco. These products include cigarettes, chewing tobacco, and vaping devices, such as e-cigarettes. If you need help quitting, ask your health care provider. Monitor your blood pressure at home as told by your health care provider. Keep all follow-up visits. This is important. Medicines Take over-the-counter and prescription medicines only as told by your health care provider. Follow directions carefully. Blood  pressure medicines must be taken as prescribed. Do not skip doses of blood pressure medicine. Doing this puts you at risk for problems and can make the medicine less effective. Ask your health care provider about side effects or reactions to medicines that you should watch for. Contact a health care provider if you: Think you are having a reaction to a medicine you are taking. Have headaches that keep coming back (recurring). Feel dizzy. Have swelling in your ankles. Have trouble with your vision. Get help right away if you: Develop a severe headache or confusion. Have unusual weakness or numbness. Feel faint. Have severe pain in your chest or abdomen. Vomit repeatedly. Have trouble breathing. These symptoms may be an emergency. Get help right away. Call 911. Do not wait to see if the symptoms will go away. Do not drive yourself to the hospital. Summary Hypertension is when the force of blood pumping through your arteries is too strong. If this condition is not controlled, it may put you at risk for serious complications. Your personal target blood pressure may vary depending on your medical conditions, your age, and other factors. For most people, a normal blood pressure is less than 120/80. Hypertension is treated with lifestyle changes, medicines, or a combination of both. Lifestyle changes include losing weight, eating a healthy,  low-sodium diet, exercising more, and limiting alcohol. This information is not intended to replace advice given to you by your health care provider. Make sure you discuss any questions you have with your health care provider. Document Revised: 10/16/2021 Document Reviewed: 10/16/2021 Elsevier Patient Education  2024 [employer].

## 2024-06-02 NOTE — Assessment & Plan Note (Signed)
 Chronic stable condition Advised to stay well-hydrated and avoid NSAIDs Did not want to start Jardiance  as recommended last time

## 2024-06-02 NOTE — Assessment & Plan Note (Signed)
 Elevated blood pressure reading in the office today but normal at home Continue amlodipine 5 mg daily Cardiovascular risks associated with hypertension discussed Diet and nutrition discussed

## 2024-06-02 NOTE — Assessment & Plan Note (Signed)
Chronic stable condition Presently on no medication

## 2024-06-02 NOTE — Assessment & Plan Note (Signed)
Stable chronic condition. Diet and nutrition discussed.

## 2024-06-02 NOTE — Progress Notes (Signed)
 [name] 83 y.o.   Chief Complaint  Patient presents with  . Follow-up    6 month f/u HTN, no other concerns     HISTORY OF PRESENT ILLNESS: This is a 83 y.o. male here for 46-month follow-up of hypertension Overall doing well.  Has no complaints or medical concerns today. BP Readings from Last 3 Encounters:  03/02/24 (!) 160/92  12/03/23 124/74  09/03/23 120/74     HPI   Prior to Admission medications   Medication Sig Start Date End Date Taking? Authorizing Provider  albuterol  (PROVENTIL ) (2.5 MG/3ML) 0.083% nebulizer solution USE 1 VIAL IN NEBULIZER EVERY 4 HOURS AS NEEDED 09/02/22  Yes Tala Eber, Isidro Margo, MD  amLODipine  (NORVASC ) 5 MG tablet Take 1 tablet (5 mg total) by mouth daily. 05/08/22  Yes Alayja Armas, Isidro Margo, MD  Ascorbic Acid (VITAMIN C) 1000 MG tablet Take 1,000 mg by mouth daily.   Yes [provider]  aspirin 81 MG EC tablet Take 81 mg by mouth every morning.   Yes [provider]  budesonide -formoterol  (SYMBICORT ) 160-4.5 MCG/ACT inhaler TAKE 2 PUFFS FIRST THING IN AM AND THEN ANOTHER 2 PUFFS ABOUT 12 HOURS LATER. 02/20/23  Yes Diamond Formica, MD  CHELATED MAGNESIUM PO Take 1 tablet by mouth daily.   Yes [provider]  cholecalciferol (VITAMIN D3) 25 MCG (1000 UT) tablet Take 1,000 Units by mouth daily.   Yes [provider]  cyanocobalamin  1000 MCG tablet Take 1,000 mcg by mouth daily.   Yes [provider]  famotidine (PEPCID) 20 MG tablet Take 20 mg by mouth daily.   Yes [provider]  Ibuprofen 200 MG CAPS Take 200 mg by mouth every 6 (six) hours as needed (headache/pain).    Yes [provider]  meclizine  (ANTIVERT ) 12.5 MG tablet Take 1 tablet by mouth 4 times a day as needed for dizziness 06/19/23  Yes Gawain Crombie Jose, MD  oxymetazoline (AFRIN) 0.05 % nasal spray Place 1 spray into both nostrils 2 (two) times daily as needed for congestion.   Yes [provider]   Polyvinyl Alcohol-Povidone (REFRESH OP) Apply 1 drop to eye as needed (dry eyes).    Yes [provider]  Respiratory Therapy Supplies (FLUTTER) DEVI Use every 4 hours as needed for cough and congestion   Yes [provider]  empagliflozin  (JARDIANCE ) 10 MG TABS tablet Take 1 tablet (10 mg total) by mouth daily before breakfast. Patient not taking: Reported on 06/02/2024 03/02/24   Jazmen Lindenbaum Jose, MD    No Known Allergies  Patient Active Problem List   Diagnosis Date Noted  . 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 Paresthesia of both feet 01/15/2023  . Prediabetes 01/15/2023  . Stage 3a chronic kidney disease (HCC) 06/22/2017  . Hx of adenomatous colonic polyps 06/25/2015  . Essential hypertension 09/05/2011  . Dyslipidemia 08/08/2011  . Esophageal dysphagia 03/12/2011  . GERD 11/06/2009  . Seasonal and perennial allergic rhinitis 05/31/2009  . Severe persistent asthma 01/04/2008    Past Medical History:  Diagnosis Date  . Acid reflux   . Asthma 06/18/2011  . Borderline hypertension   . Cataract   . Colon polyps 2011   1 Hyperplastic and 1 Tubular Adenoma   . Dysphagia   . Esophageal stricture   . GERD (gastroesophageal reflux disease)   . Hx of adenomatous colonic polyps 06/25/2015  . Personal history of colonic polyps 12/1989  . Shingles   . Shoulder pain, right   . Unspecified  asthma(493.90)   . Urinary frequency     Past Surgical History:  Procedure Laterality Date  . BALLOON DILATION N/A 02/07/2015   Procedure: BALLOON DILATION;  Surgeon: Kenney Peacemaker, MD;  Location: [facility];  Service: Endoscopy;  Laterality: N/A;  . ESOPHAGOGASTRODUODENOSCOPY (EGD) WITH PROPOFOL  N/A 02/07/2015   Procedure: ESOPHAGOGASTRODUODENOSCOPY (EGD) WITH PROPOFOL ;  Surgeon: Kenney Peacemaker, MD;  Location: [facility];  Service: Endoscopy;  Laterality: N/A;  . HEMORRHOID SURGERY    . HERNIA REPAIR    . MASS EXCISION N/A 08/14/2016   Procedure: EXCISION SEBACEOUS  CYST BACK;  Surgeon: Jacolyn Matar, MD;  Location: [facility];  Service: General;  Laterality: N/A;  EXCISION SEBACEOUS CYST BACK  . TONSILLECTOMY      Social History   Socioeconomic History  . Marital status: Significant Other    Spouse name: Not on file  . Number of children: 7  . Years of education: Not on file  . Highest education level: Not on file  Occupational History  . 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 Smoking status: Never    Passive exposure: Never  . Smokeless tobacco: Never  Vaping Use  . Vaping status: Never Used  Substance and Sexual Activity  . Alcohol use: No  . Drug use: No  . Sexual activity: Not on file  Other Topics Concern  . Not on file  Social History Narrative   Patient is separated from spouse   Lives alone   Social Drivers of Health   Financial Resource Strain: Low Risk  (12/02/2023)   Overall Financial Resource Strain (CARDIA)   . Difficulty of Paying Living Expenses: Not hard at all  Food Insecurity: No Food Insecurity (12/02/2023)   Hunger Vital Sign   . Worried About Programme researcher, broadcasting/film/video in the Last Year: Never true   . Ran Out of Food in the Last Year: Never true  Transportation Needs: No Transportation Needs (12/02/2023)   PRAPARE - Transportation   . Lack of Transportation (Medical): No   . Lack of Transportation (Non-Medical): No  Physical Activity: Sufficiently Active (12/02/2023)   Exercise Vital Sign   . Days of Exercise per Week: 7 days   . Minutes of Exercise per Session: 30 min  Stress: No Stress Concern Present (12/02/2023)   [employer] of Occupational Health - Occupational Stress Questionnaire   . Feeling of Stress : Not at all  Social Connections: Moderately Integrated (12/02/2023)   Social Connection and Isolation Panel [NHANES]   . Frequency of Communication with Friends and Family: More than three times a week   . Frequency of Social Gatherings  with Friends and Family: More than three times a week   . Attends Religious Services: More than 4 times per year   . Active Member of Clubs or Organizations: Yes   . Attends Banker Meetings: Never   . Marital Status: Divorced  Intimate Partner Violence: Patient Unable To Answer (12/02/2023)   Humiliation, Afraid, Rape, and Kick questionnaire   . Fear of Current or Ex-Partner: Patient unable to answer   . Emotionally Abused: Patient unable to answer   . Physically Abused: Patient unable to answer   . Sexually Abused: Patient unable to answer    Family History  Problem Relation Age of Onset  . Heart disease Mother   . Kidney failure Sister   . Breast cancer Sister   . Lung cancer  Brother   . Brain cancer Sister   . Asthma Sister   . Colon cancer Neg Hx      Review of Systems  Constitutional: Negative.  Negative for chills and fever.  HENT: Negative.  Negative for congestion and sore throat.   Respiratory: Negative.  Negative for cough and shortness of breath.   Cardiovascular: Negative.  Negative for chest pain and palpitations.  Gastrointestinal:  Negative for abdominal pain, diarrhea, nausea and vomiting.  Genitourinary: Negative.  Negative for dysuria and hematuria.  Skin: Negative.  Negative for rash.  Neurological: Negative.  Negative for dizziness and headaches.  All other systems reviewed and are negative.   Today's Vitals   06/02/24 1053  BP: (!) 148/84  Pulse: 81  Temp: (!) 97.5 F (36.4 C)  TempSrc: Oral  SpO2: 94%  Weight: 183 lb (83 kg)  Height: 5' 6 (1.676 m)   Body mass index is 29.54 kg/m.   Physical Exam Vitals reviewed.  Constitutional:      Appearance: Normal appearance.  HENT:     Head: Normocephalic.     Mouth/Throat:     Mouth: Mucous membranes are moist.     Pharynx: Oropharynx is clear.  Eyes:     Extraocular Movements: Extraocular movements intact.     Pupils: Pupils are equal, round, and reactive to light.   Cardiovascular:     Rate and Rhythm: Normal rate and regular rhythm.     Pulses: Normal pulses.     Heart sounds: Normal heart sounds.  Pulmonary:     Effort: Pulmonary effort is normal.     Breath sounds: Normal breath sounds.  Musculoskeletal:     Cervical back: No tenderness.  Lymphadenopathy:     Cervical: No cervical adenopathy.  Skin:    General: Skin is warm and dry.     Capillary Refill: Capillary refill takes less than 2 seconds.  Neurological:     General: No focal deficit present.     Mental Status: He is alert and oriented to person, place, and time.  Psychiatric:        Mood and Affect: Mood normal.        Behavior: Behavior normal.     ASSESSMENT & PLAN: A total of 43 minutes was spent with the patient and counseling/coordination of care regarding preparing for this visit, review of most recent office visit notes, review of multiple chronic medical conditions and their management, review of all medications, review of most recent bloodwork results, review of health maintenance items, education on nutrition, prognosis, documentation, and need for follow up.  Problem List Items Addressed This Visit       Cardiovascular and Mediastinum   Essential hypertension - Primary   Elevated blood pressure reading in the office today but normal at home Continue amlodipine  5 mg daily Cardiovascular risks associated with hypertension discussed Diet and nutrition discussed        Genitourinary   Stage 3a chronic kidney disease (HCC)   Chronic stable condition Advised to stay well-hydrated and avoid NSAIDs Did not want to start Jardiance  as recommended last time        Other   Dyslipidemia   Chronic stable condition Presently on no medication      Prediabetes   Stable chronic condition Diet and nutrition discussed      Patient Instructions  Hypertension, Adult High blood pressure (hypertension) is when the force of blood pumping through the arteries is too  strong. The arteries are the  blood vessels that carry blood from the heart throughout the body. Hypertension forces the heart to work harder to pump blood and may cause arteries to become narrow or stiff. Untreated or uncontrolled hypertension can lead to a heart attack, heart failure, a stroke, kidney disease, and other problems. A blood pressure reading consists of a higher number over a lower number. Ideally, your blood pressure should be below 120/80. The first (top) number is called the systolic pressure. It is a measure of the pressure in your arteries as your heart beats. The second (bottom) number is called the diastolic pressure. It is a measure of the pressure in your arteries as the heart relaxes. What are the causes? The exact cause of this condition is not known. There are some conditions that result in high blood pressure. What increases the risk? Certain factors may make you more likely to develop high blood pressure. Some of these risk factors are under your control, including: Smoking. Not getting enough exercise or physical activity. Being overweight. Having too much fat, sugar, calories, or salt (sodium) in your diet. Drinking too much alcohol. Other risk factors include: Having a personal history of heart disease, diabetes, high cholesterol, or kidney disease. Stress. Having a family history of high blood pressure and high cholesterol. Having obstructive sleep apnea. Age. The risk increases with age. What are the signs or symptoms? High blood pressure may not cause symptoms. Very high blood pressure (hypertensive crisis) may cause: Headache. Fast or irregular heartbeats (palpitations). Shortness of breath. Nosebleed. Nausea and vomiting. Vision changes. Severe chest pain, dizziness, and seizures. How is this diagnosed? This condition is diagnosed by measuring your blood pressure while you are seated, with your arm resting on a flat surface, your legs uncrossed, and  your feet flat on the floor. The cuff of the blood pressure monitor will be placed directly against the skin of your upper arm at the level of your heart. Blood pressure should be measured at least twice using the same arm. Certain conditions can cause a difference in blood pressure between your right and left arms. If you have a high blood pressure reading during one visit or you have normal blood pressure with other risk factors, you may be asked to: Return on a different day to have your blood pressure checked again. Monitor your blood pressure at home for 1 week or longer. If you are diagnosed with hypertension, you may have other blood or imaging tests to help your health care provider understand your overall risk for other conditions. How is this treated? This condition is treated by making healthy lifestyle changes, such as eating healthy foods, exercising more, and reducing your alcohol intake. You may be referred for counseling on a healthy diet and physical activity. Your health care provider may prescribe medicine if lifestyle changes are not enough to get your blood pressure under control and if: Your systolic blood pressure is above 130. Your diastolic blood pressure is above 80. Your personal target blood pressure may vary depending on your medical conditions, your age, and other factors. Follow these instructions at home: Eating and drinking  Eat a diet that is high in fiber and potassium, and low in sodium, added sugar, and fat. An example of this eating plan is called the DASH diet. DASH stands for Dietary Approaches to Stop Hypertension. To eat this way: Eat plenty of fresh fruits and vegetables. Try to fill one half of your plate at each meal with fruits and vegetables. Eat whole  grains, such as whole-wheat pasta, brown rice, or whole-grain bread. Fill about one fourth of your plate with whole grains. Eat or drink low-fat dairy products, such as skim milk or low-fat yogurt. Avoid  fatty cuts of meat, processed or cured meats, and poultry with skin. Fill about one fourth of your plate with lean proteins, such as fish, chicken without skin, beans, eggs, or tofu. Avoid pre-made and processed foods. These tend to be higher in sodium, added sugar, and fat. Reduce your daily sodium intake. Many people with hypertension should eat less than 1,500 mg of sodium a day. Do not drink alcohol if: Your health care provider tells you not to drink. You are pregnant, may be pregnant, or are planning to become pregnant. If you drink alcohol: Limit how much you have to: 0-1 drink a day for women. 0-2 drinks a day for men. Know how much alcohol is in your drink. In the [place] one drink equals one 12 oz bottle of beer (355 mL), one 5 oz glass of wine (148 mL), or one 1 oz glass of hard liquor (44 mL). Lifestyle  Work with your health care provider to maintain a healthy body weight or to lose weight. Ask what an ideal weight is for you. Get at least 30 minutes of exercise that causes your heart to beat faster (aerobic exercise) most days of the week. Activities may include walking, swimming, or biking. Include exercise to strengthen your muscles (resistance exercise), such as Pilates or lifting weights, as part of your weekly exercise routine. Try to do these types of exercises for 30 minutes at least 3 days a week. Do not use any products that contain nicotine or tobacco. These products include cigarettes, chewing tobacco, and vaping devices, such as e-cigarettes. If you need help quitting, ask your health care provider. Monitor your blood pressure at home as told by your health care provider. Keep all follow-up visits. This is important. Medicines Take over-the-counter and prescription medicines only as told by your health care provider. Follow directions carefully. Blood pressure medicines must be taken as prescribed. Do not skip doses of blood pressure medicine. Doing this puts you at risk  for problems and can make the medicine less effective. Ask your health care provider about side effects or reactions to medicines that you should watch for. Contact a health care provider if you: Think you are having a reaction to a medicine you are taking. Have headaches that keep coming back (recurring). Feel dizzy. Have swelling in your ankles. Have trouble with your vision. Get help right away if you: Develop a severe headache or confusion. Have unusual weakness or numbness. Feel faint. Have severe pain in your chest or abdomen. Vomit repeatedly. Have trouble breathing. These symptoms may be an emergency. Get help right away. Call 911. Do not wait to see if the symptoms will go away. Do not drive yourself to the hospital. Summary Hypertension is when the force of blood pumping through your arteries is too strong. If this condition is not controlled, it may put you at risk for serious complications. Your personal target blood pressure may vary depending on your medical conditions, your age, and other factors. For most people, a normal blood pressure is less than 120/80. Hypertension is treated with lifestyle changes, medicines, or a combination of both. Lifestyle changes include losing weight, eating a healthy, low-sodium diet, exercising more, and limiting alcohol. This information is not intended to replace advice given to you by your health care provider.  Make sure you discuss any questions you have with your health care provider. Document Revised: 10/16/2021 Document Reviewed: 10/16/2021 Elsevier Patient Education  2024 Else[employer]      Maryagnes Small, MD [facility]

## 2024-07-06 DIAGNOSIS — H26492 Other secondary cataract, left eye: Secondary | ICD-10-CM | POA: Diagnosis not present

## 2024-07-06 DIAGNOSIS — H5211 Myopia, right eye: Secondary | ICD-10-CM | POA: Diagnosis not present

## 2024-07-06 DIAGNOSIS — H52203 Unspecified astigmatism, bilateral: Secondary | ICD-10-CM | POA: Diagnosis not present

## 2024-12-02 ENCOUNTER — Ambulatory Visit

## 2024-12-02 ENCOUNTER — Ambulatory Visit: Admitting: Emergency Medicine

## 2025-04-05 ENCOUNTER — Ambulatory Visit: Admitting: Emergency Medicine

## 2025-04-05 ENCOUNTER — Ambulatory Visit

## 2025-04-12 ENCOUNTER — Ambulatory Visit
